# Patient Record
Sex: Female | Born: 1935 | Race: Black or African American | Hispanic: No | Smoking: Former smoker
Health system: Southern US, Community
[De-identification: ages and names within clinical notes are randomized; demographics above are authoritative.]

## PROBLEM LIST (undated history)

## (undated) DIAGNOSIS — I2699 Other pulmonary embolism without acute cor pulmonale: Secondary | ICD-10-CM

## (undated) DIAGNOSIS — F419 Anxiety disorder, unspecified: Secondary | ICD-10-CM

## (undated) DIAGNOSIS — E042 Nontoxic multinodular goiter: Secondary | ICD-10-CM

## (undated) DIAGNOSIS — K922 Gastrointestinal hemorrhage, unspecified: Secondary | ICD-10-CM

## (undated) DIAGNOSIS — M1990 Unspecified osteoarthritis, unspecified site: Secondary | ICD-10-CM

## (undated) DIAGNOSIS — I509 Heart failure, unspecified: Secondary | ICD-10-CM

## (undated) DIAGNOSIS — K5730 Diverticulosis of large intestine without perforation or abscess without bleeding: Secondary | ICD-10-CM

## (undated) DIAGNOSIS — A048 Other specified bacterial intestinal infections: Secondary | ICD-10-CM

## (undated) DIAGNOSIS — I428 Other cardiomyopathies: Secondary | ICD-10-CM

## (undated) DIAGNOSIS — N200 Calculus of kidney: Secondary | ICD-10-CM

## (undated) DIAGNOSIS — Z8679 Personal history of other diseases of the circulatory system: Secondary | ICD-10-CM

## (undated) DIAGNOSIS — I10 Essential (primary) hypertension: Secondary | ICD-10-CM

## (undated) DIAGNOSIS — K219 Gastro-esophageal reflux disease without esophagitis: Secondary | ICD-10-CM

## (undated) DIAGNOSIS — D3500 Benign neoplasm of unspecified adrenal gland: Secondary | ICD-10-CM

## (undated) DIAGNOSIS — I447 Left bundle-branch block, unspecified: Secondary | ICD-10-CM

## (undated) DIAGNOSIS — I480 Paroxysmal atrial fibrillation: Secondary | ICD-10-CM

## (undated) DIAGNOSIS — I2510 Atherosclerotic heart disease of native coronary artery without angina pectoris: Secondary | ICD-10-CM

## (undated) DIAGNOSIS — E7800 Pure hypercholesterolemia, unspecified: Secondary | ICD-10-CM

## (undated) DIAGNOSIS — D126 Benign neoplasm of colon, unspecified: Secondary | ICD-10-CM

## (undated) HISTORY — DX: Calculus of kidney: N20.0

## (undated) HISTORY — DX: Gastro-esophageal reflux disease without esophagitis: K21.9

## (undated) HISTORY — DX: Benign neoplasm of colon, unspecified: D12.6

## (undated) HISTORY — DX: Nontoxic multinodular goiter: E04.2

## (undated) HISTORY — DX: Unspecified osteoarthritis, unspecified site: M19.90

## (undated) HISTORY — PX: KNEE SURGERY: SHX244

## (undated) HISTORY — DX: Pure hypercholesterolemia, unspecified: E78.00

## (undated) HISTORY — DX: Paroxysmal atrial fibrillation: I48.0

## (undated) HISTORY — DX: Diverticulosis of large intestine without perforation or abscess without bleeding: K57.30

## (undated) HISTORY — DX: Benign neoplasm of unspecified adrenal gland: D35.00

## (undated) HISTORY — DX: Anxiety disorder, unspecified: F41.9

## (undated) HISTORY — PX: CATARACT EXTRACTION: SUR2

## (undated) HISTORY — DX: Other pulmonary embolism without acute cor pulmonale: I26.99

## (undated) HISTORY — PX: SHOULDER SURGERY: SHX246

## (undated) HISTORY — DX: Atherosclerotic heart disease of native coronary artery without angina pectoris: I25.10

## (undated) HISTORY — DX: Essential (primary) hypertension: I10

## (undated) HISTORY — PX: TONSILLECTOMY: SUR1361

## (undated) HISTORY — DX: Other cardiomyopathies: I42.8

## (undated) HISTORY — DX: Other specified bacterial intestinal infections: A04.8

---

## 1997-05-04 ENCOUNTER — Encounter: Payer: Self-pay | Admitting: Gastroenterology

## 1997-12-20 ENCOUNTER — Emergency Department (HOSPITAL_COMMUNITY): Admission: EM | Admit: 1997-12-20 | Discharge: 1997-12-20 | Payer: Self-pay | Admitting: Emergency Medicine

## 1998-01-25 ENCOUNTER — Inpatient Hospital Stay (HOSPITAL_COMMUNITY): Admission: RE | Admit: 1998-01-25 | Discharge: 1998-01-27 | Payer: Self-pay | Admitting: Orthopedic Surgery

## 1998-02-01 ENCOUNTER — Encounter: Admission: RE | Admit: 1998-02-01 | Discharge: 1998-05-02 | Payer: Self-pay | Admitting: Orthopedic Surgery

## 1999-08-18 DIAGNOSIS — Z8679 Personal history of other diseases of the circulatory system: Secondary | ICD-10-CM

## 1999-08-18 HISTORY — DX: Personal history of other diseases of the circulatory system: Z86.79

## 1999-10-03 ENCOUNTER — Inpatient Hospital Stay (HOSPITAL_COMMUNITY): Admission: EM | Admit: 1999-10-03 | Discharge: 1999-10-07 | Payer: Self-pay | Admitting: Emergency Medicine

## 1999-10-03 ENCOUNTER — Encounter: Payer: Self-pay | Admitting: Emergency Medicine

## 2001-06-02 ENCOUNTER — Encounter: Payer: Self-pay | Admitting: Urology

## 2001-06-02 ENCOUNTER — Encounter: Admission: RE | Admit: 2001-06-02 | Discharge: 2001-06-02 | Payer: Self-pay | Admitting: Urology

## 2004-06-30 ENCOUNTER — Ambulatory Visit: Payer: Self-pay | Admitting: Pulmonary Disease

## 2004-09-29 ENCOUNTER — Ambulatory Visit: Payer: Self-pay | Admitting: Pulmonary Disease

## 2005-01-27 ENCOUNTER — Ambulatory Visit: Payer: Self-pay | Admitting: Pulmonary Disease

## 2005-05-27 ENCOUNTER — Ambulatory Visit: Payer: Self-pay | Admitting: Pulmonary Disease

## 2005-07-23 ENCOUNTER — Ambulatory Visit: Payer: Self-pay | Admitting: Pulmonary Disease

## 2005-07-28 ENCOUNTER — Ambulatory Visit: Payer: Self-pay | Admitting: Cardiology

## 2005-08-26 ENCOUNTER — Ambulatory Visit: Payer: Self-pay | Admitting: Pulmonary Disease

## 2005-11-25 ENCOUNTER — Ambulatory Visit: Payer: Self-pay | Admitting: Pulmonary Disease

## 2006-03-12 ENCOUNTER — Ambulatory Visit: Payer: Self-pay | Admitting: Pulmonary Disease

## 2006-07-13 ENCOUNTER — Ambulatory Visit: Payer: Self-pay | Admitting: Pulmonary Disease

## 2006-10-11 ENCOUNTER — Ambulatory Visit: Payer: Self-pay | Admitting: Pulmonary Disease

## 2008-05-10 ENCOUNTER — Ambulatory Visit: Payer: Self-pay | Admitting: Pulmonary Disease

## 2008-05-10 DIAGNOSIS — E7800 Pure hypercholesterolemia, unspecified: Secondary | ICD-10-CM | POA: Insufficient documentation

## 2008-05-10 DIAGNOSIS — M1990 Unspecified osteoarthritis, unspecified site: Secondary | ICD-10-CM | POA: Insufficient documentation

## 2008-05-10 DIAGNOSIS — G459 Transient cerebral ischemic attack, unspecified: Secondary | ICD-10-CM | POA: Insufficient documentation

## 2008-05-10 DIAGNOSIS — N200 Calculus of kidney: Secondary | ICD-10-CM

## 2008-05-10 DIAGNOSIS — I10 Essential (primary) hypertension: Secondary | ICD-10-CM | POA: Insufficient documentation

## 2008-05-10 DIAGNOSIS — K219 Gastro-esophageal reflux disease without esophagitis: Secondary | ICD-10-CM

## 2008-06-11 ENCOUNTER — Ambulatory Visit: Payer: Self-pay | Admitting: Pulmonary Disease

## 2008-06-14 LAB — CONVERTED CEMR LAB
ALT: 12 units/L (ref 0–35)
AST: 16 units/L (ref 0–37)
Albumin: 3.8 g/dL (ref 3.5–5.2)
Bilirubin, Direct: 0.1 mg/dL (ref 0.0–0.3)
CO2: 28 meq/L (ref 19–32)
Calcium: 9.5 mg/dL (ref 8.4–10.5)
Cholesterol: 294 mg/dL (ref 0–200)
Direct LDL: 156.6 mg/dL
GFR calc non Af Amer: 57 mL/min
Hgb A1c MFr Bld: 11.8 % — ABNORMAL HIGH (ref 4.6–6.0)
Total Protein: 7.9 g/dL (ref 6.0–8.3)
VLDL: 41 mg/dL — ABNORMAL HIGH (ref 0–40)

## 2008-06-15 ENCOUNTER — Telehealth: Payer: Self-pay | Admitting: Pulmonary Disease

## 2008-06-18 ENCOUNTER — Telehealth (INDEPENDENT_AMBULATORY_CARE_PROVIDER_SITE_OTHER): Payer: Self-pay | Admitting: *Deleted

## 2008-06-20 ENCOUNTER — Ambulatory Visit: Payer: Self-pay | Admitting: Pulmonary Disease

## 2008-06-20 DIAGNOSIS — F411 Generalized anxiety disorder: Secondary | ICD-10-CM | POA: Insufficient documentation

## 2008-06-20 DIAGNOSIS — D3500 Benign neoplasm of unspecified adrenal gland: Secondary | ICD-10-CM | POA: Insufficient documentation

## 2008-06-20 DIAGNOSIS — K5730 Diverticulosis of large intestine without perforation or abscess without bleeding: Secondary | ICD-10-CM | POA: Insufficient documentation

## 2008-06-20 DIAGNOSIS — Q279 Congenital malformation of peripheral vascular system, unspecified: Secondary | ICD-10-CM | POA: Insufficient documentation

## 2008-06-25 ENCOUNTER — Telehealth (INDEPENDENT_AMBULATORY_CARE_PROVIDER_SITE_OTHER): Payer: Self-pay | Admitting: *Deleted

## 2008-07-17 ENCOUNTER — Telehealth: Payer: Self-pay | Admitting: Pulmonary Disease

## 2008-08-08 ENCOUNTER — Ambulatory Visit: Payer: Self-pay | Admitting: Pulmonary Disease

## 2008-08-20 ENCOUNTER — Telehealth: Payer: Self-pay | Admitting: Pulmonary Disease

## 2008-09-10 ENCOUNTER — Ambulatory Visit: Payer: Self-pay | Admitting: Pulmonary Disease

## 2008-09-10 LAB — CONVERTED CEMR LAB
ALT: 15 units/L (ref 0–35)
Albumin: 3.5 g/dL (ref 3.5–5.2)
Alkaline Phosphatase: 57 units/L (ref 39–117)
BUN: 21 mg/dL (ref 6–23)
Bilirubin, Direct: 0.1 mg/dL (ref 0.0–0.3)
CO2: 31 meq/L (ref 19–32)
Calcium: 9.7 mg/dL (ref 8.4–10.5)
Creatinine, Ser: 1.1 mg/dL (ref 0.4–1.2)
GFR calc Af Amer: 62 mL/min
Glucose, Bld: 139 mg/dL — ABNORMAL HIGH (ref 70–99)
Lymphocytes Relative: 32.5 % (ref 12.0–46.0)
MCHC: 33.8 g/dL (ref 30.0–36.0)
MCV: 83.1 fL (ref 78.0–100.0)
Monocytes Absolute: 0.6 10*3/uL (ref 0.1–1.0)
Platelets: 217 10*3/uL (ref 150–400)
RDW: 13 % (ref 11.5–14.6)
TSH: 1.75 microintl units/mL (ref 0.35–5.50)
Total Protein: 8.5 g/dL — ABNORMAL HIGH (ref 6.0–8.3)

## 2008-09-13 ENCOUNTER — Ambulatory Visit: Payer: Self-pay | Admitting: Endocrinology

## 2008-09-26 ENCOUNTER — Encounter: Payer: Self-pay | Admitting: Cardiology

## 2008-09-26 ENCOUNTER — Ambulatory Visit: Payer: Self-pay

## 2008-09-26 ENCOUNTER — Encounter: Payer: Self-pay | Admitting: Endocrinology

## 2008-09-26 ENCOUNTER — Encounter: Payer: Self-pay | Admitting: Pulmonary Disease

## 2008-09-30 ENCOUNTER — Encounter: Payer: Self-pay | Admitting: Endocrinology

## 2008-10-02 ENCOUNTER — Ambulatory Visit: Payer: Self-pay | Admitting: Cardiology

## 2008-10-04 ENCOUNTER — Encounter: Payer: Self-pay | Admitting: Pulmonary Disease

## 2008-10-15 ENCOUNTER — Ambulatory Visit: Payer: Self-pay | Admitting: Cardiology

## 2008-10-15 ENCOUNTER — Ambulatory Visit: Payer: Self-pay

## 2008-10-15 ENCOUNTER — Ambulatory Visit: Payer: Self-pay | Admitting: Endocrinology

## 2008-10-15 ENCOUNTER — Encounter: Payer: Self-pay | Admitting: Cardiology

## 2008-10-15 LAB — CONVERTED CEMR LAB
BUN: 17 mg/dL (ref 6–23)
Creatinine, Ser: 1.1 mg/dL (ref 0.4–1.2)
Glucose, Bld: 167 mg/dL — ABNORMAL HIGH (ref 70–99)

## 2008-11-08 ENCOUNTER — Ambulatory Visit: Payer: Self-pay | Admitting: Endocrinology

## 2008-11-12 ENCOUNTER — Encounter: Payer: Self-pay | Admitting: Cardiology

## 2008-11-12 ENCOUNTER — Ambulatory Visit: Payer: Self-pay | Admitting: Cardiology

## 2008-11-12 LAB — CONVERTED CEMR LAB
CO2: 31 meq/L (ref 19–32)
Chloride: 107 meq/L (ref 96–112)
Creatinine, Ser: 1.1 mg/dL (ref 0.4–1.2)
Eosinophils Absolute: 0.1 10*3/uL (ref 0.0–0.7)
GFR calc non Af Amer: 61.63 mL/min (ref >60)
HCT: 38.8 % (ref 36.0–46.0)
INR: 1 (ref 0.8–1.0)
Lymphocytes Relative: 40.2 % (ref 12.0–46.0)
Monocytes Absolute: 0.5 10*3/uL (ref 0.1–1.0)
Prothrombin Time: 10.3 s — ABNORMAL LOW (ref 10.9–13.3)
RBC: 4.55 M/uL (ref 3.87–5.11)
Sodium: 143 meq/L (ref 135–145)
WBC: 6.5 10*3/uL (ref 4.5–10.5)
aPTT: 33 s — ABNORMAL HIGH (ref 21.7–28.8)

## 2008-11-15 ENCOUNTER — Ambulatory Visit: Payer: Self-pay | Admitting: Cardiology

## 2008-11-15 ENCOUNTER — Inpatient Hospital Stay (HOSPITAL_BASED_OUTPATIENT_CLINIC_OR_DEPARTMENT_OTHER): Admission: RE | Admit: 2008-11-15 | Discharge: 2008-11-15 | Payer: Self-pay | Admitting: Cardiology

## 2008-11-28 ENCOUNTER — Ambulatory Visit: Payer: Self-pay | Admitting: Pulmonary Disease

## 2008-11-29 DIAGNOSIS — I2510 Atherosclerotic heart disease of native coronary artery without angina pectoris: Secondary | ICD-10-CM

## 2008-11-29 LAB — CONVERTED CEMR LAB
Basophils Absolute: 0 10*3/uL (ref 0.0–0.1)
Basophils Relative: 0.6 % (ref 0.0–3.0)
Bilirubin, Direct: 0.1 mg/dL (ref 0.0–0.3)
CO2: 29 meq/L (ref 19–32)
Chloride: 110 meq/L (ref 96–112)
GFR calc non Af Amer: 61.62 mL/min (ref >60)
Glucose, Bld: 102 mg/dL — ABNORMAL HIGH (ref 70–99)
HCT: 38.4 % (ref 36.0–46.0)
HDL: 94.1 mg/dL (ref >39.00)
Hemoglobin: 12.8 g/dL (ref 12.0–15.0)
Lymphocytes Relative: 34.7 % (ref 12.0–46.0)
Lymphs Abs: 1.9 10*3/uL (ref 0.7–4.0)
MCHC: 33.2 g/dL (ref 30.0–36.0)
MCV: 85.5 fL (ref 78.0–100.0)
Monocytes Relative: 6.8 % (ref 3.0–12.0)
Platelets: 152 10*3/uL (ref 150.0–400.0)
RBC: 4.5 M/uL (ref 3.87–5.11)
Total Bilirubin: 0.7 mg/dL (ref 0.3–1.2)
Total CHOL/HDL Ratio: 2
VLDL: 9.6 mg/dL (ref 0.0–40.0)

## 2008-12-05 ENCOUNTER — Ambulatory Visit: Payer: Self-pay | Admitting: Cardiology

## 2008-12-05 ENCOUNTER — Encounter: Payer: Self-pay | Admitting: Physician Assistant

## 2009-01-08 ENCOUNTER — Ambulatory Visit: Payer: Self-pay | Admitting: Endocrinology

## 2009-01-27 ENCOUNTER — Ambulatory Visit: Payer: Self-pay | Admitting: Pulmonary Disease

## 2009-01-28 ENCOUNTER — Encounter: Payer: Self-pay | Admitting: Gastroenterology

## 2009-01-28 ENCOUNTER — Ambulatory Visit: Payer: Self-pay | Admitting: Gastroenterology

## 2009-01-28 ENCOUNTER — Inpatient Hospital Stay (HOSPITAL_COMMUNITY): Admission: EM | Admit: 2009-01-28 | Discharge: 2009-01-31 | Payer: Self-pay | Admitting: Emergency Medicine

## 2009-01-28 ENCOUNTER — Encounter: Payer: Self-pay | Admitting: Pulmonary Disease

## 2009-01-31 ENCOUNTER — Encounter: Payer: Self-pay | Admitting: Pulmonary Disease

## 2009-02-01 ENCOUNTER — Telehealth: Payer: Self-pay | Admitting: Pulmonary Disease

## 2009-02-04 ENCOUNTER — Telehealth: Payer: Self-pay | Admitting: Pulmonary Disease

## 2009-02-14 DIAGNOSIS — I2699 Other pulmonary embolism without acute cor pulmonale: Secondary | ICD-10-CM

## 2009-02-14 HISTORY — DX: Other pulmonary embolism without acute cor pulmonale: I26.99

## 2009-02-26 ENCOUNTER — Ambulatory Visit: Payer: Self-pay | Admitting: Pulmonary Disease

## 2009-02-26 ENCOUNTER — Inpatient Hospital Stay (HOSPITAL_COMMUNITY): Admission: EM | Admit: 2009-02-26 | Discharge: 2009-03-06 | Payer: Self-pay | Admitting: Emergency Medicine

## 2009-02-26 ENCOUNTER — Ambulatory Visit: Payer: Self-pay | Admitting: Cardiology

## 2009-02-27 ENCOUNTER — Encounter: Payer: Self-pay | Admitting: Pulmonary Disease

## 2009-03-06 ENCOUNTER — Encounter: Payer: Self-pay | Admitting: Pulmonary Disease

## 2009-03-08 ENCOUNTER — Telehealth: Payer: Self-pay | Admitting: Pulmonary Disease

## 2009-03-11 ENCOUNTER — Telehealth (INDEPENDENT_AMBULATORY_CARE_PROVIDER_SITE_OTHER): Payer: Self-pay | Admitting: *Deleted

## 2009-03-11 ENCOUNTER — Ambulatory Visit: Payer: Self-pay | Admitting: Pulmonary Disease

## 2009-03-11 DIAGNOSIS — K279 Peptic ulcer, site unspecified, unspecified as acute or chronic, without hemorrhage or perforation: Secondary | ICD-10-CM | POA: Insufficient documentation

## 2009-03-12 LAB — CONVERTED CEMR LAB
Basophils Absolute: 0 10*3/uL (ref 0.0–0.1)
Basophils Relative: 0.5 % (ref 0.0–3.0)
Calcium: 9.4 mg/dL (ref 8.4–10.5)
HCT: 37.1 % (ref 36.0–46.0)
Lymphocytes Relative: 35.5 % (ref 12.0–46.0)
MCHC: 33.4 g/dL (ref 30.0–36.0)
MCV: 83.6 fL (ref 78.0–100.0)
Monocytes Absolute: 0.4 10*3/uL (ref 0.1–1.0)
Neutro Abs: 2.9 10*3/uL (ref 1.4–7.7)
Neutrophils Relative %: 55 % (ref 43.0–77.0)
Platelets: 170 10*3/uL (ref 150.0–400.0)
RDW: 14.3 % (ref 11.5–14.6)
WBC: 5.3 10*3/uL (ref 4.5–10.5)

## 2009-03-14 ENCOUNTER — Telehealth (INDEPENDENT_AMBULATORY_CARE_PROVIDER_SITE_OTHER): Payer: Self-pay | Admitting: *Deleted

## 2009-03-20 ENCOUNTER — Ambulatory Visit: Payer: Self-pay | Admitting: Pulmonary Disease

## 2009-03-22 LAB — CONVERTED CEMR LAB: Prothrombin Time: 13.6 s — ABNORMAL HIGH (ref 9.1–11.7)

## 2009-04-01 ENCOUNTER — Telehealth (INDEPENDENT_AMBULATORY_CARE_PROVIDER_SITE_OTHER): Payer: Self-pay | Admitting: *Deleted

## 2009-04-08 ENCOUNTER — Ambulatory Visit: Payer: Self-pay | Admitting: Pulmonary Disease

## 2009-04-12 ENCOUNTER — Ambulatory Visit: Payer: Self-pay | Admitting: Gastroenterology

## 2009-04-12 DIAGNOSIS — K269 Duodenal ulcer, unspecified as acute or chronic, without hemorrhage or perforation: Secondary | ICD-10-CM | POA: Insufficient documentation

## 2009-04-15 ENCOUNTER — Telehealth: Payer: Self-pay | Admitting: Cardiology

## 2009-04-16 LAB — CONVERTED CEMR LAB
INR: 1.1 — ABNORMAL HIGH (ref 0.8–1.0)
Prothrombin Time: 11.4 s (ref 9.1–11.7)

## 2009-04-19 ENCOUNTER — Ambulatory Visit: Payer: Self-pay | Admitting: Gastroenterology

## 2009-04-29 ENCOUNTER — Ambulatory Visit: Payer: Self-pay | Admitting: Pulmonary Disease

## 2009-05-08 ENCOUNTER — Ambulatory Visit: Payer: Self-pay | Admitting: Endocrinology

## 2009-05-10 ENCOUNTER — Encounter: Admission: RE | Admit: 2009-05-10 | Discharge: 2009-05-10 | Payer: Self-pay | Admitting: Endocrinology

## 2009-05-10 LAB — CONVERTED CEMR LAB
Basophils Relative: 0.5 % (ref 0.0–3.0)
CO2: 31 meq/L (ref 19–32)
Creatinine, Ser: 1.3 mg/dL — ABNORMAL HIGH (ref 0.4–1.2)
GFR calc non Af Amer: 50.76 mL/min (ref >60)
Hemoglobin: 12.8 g/dL (ref 12.0–15.0)
Lymphocytes Relative: 29.3 % (ref 12.0–46.0)
MCHC: 32.7 g/dL (ref 30.0–36.0)
MCV: 85.9 fL (ref 78.0–100.0)
Platelets: 150 10*3/uL (ref 150.0–400.0)
Potassium: 3.6 meq/L (ref 3.5–5.1)
RDW: 13.9 % (ref 11.5–14.6)
TSH: 0.74 microintl units/mL (ref 0.35–5.50)
WBC: 6.3 10*3/uL (ref 4.5–10.5)

## 2009-05-11 DIAGNOSIS — E042 Nontoxic multinodular goiter: Secondary | ICD-10-CM | POA: Insufficient documentation

## 2009-05-16 ENCOUNTER — Telehealth: Payer: Self-pay | Admitting: Pulmonary Disease

## 2009-05-16 ENCOUNTER — Ambulatory Visit: Payer: Self-pay | Admitting: Cardiology

## 2009-05-17 ENCOUNTER — Encounter: Payer: Self-pay | Admitting: Cardiology

## 2009-05-17 ENCOUNTER — Ambulatory Visit: Payer: Self-pay | Admitting: Vascular Surgery

## 2009-05-17 ENCOUNTER — Inpatient Hospital Stay (HOSPITAL_COMMUNITY): Admission: EM | Admit: 2009-05-17 | Discharge: 2009-05-18 | Payer: Self-pay | Admitting: Emergency Medicine

## 2009-05-20 ENCOUNTER — Ambulatory Visit: Payer: Self-pay | Admitting: Pulmonary Disease

## 2009-05-24 ENCOUNTER — Telehealth: Payer: Self-pay | Admitting: Pulmonary Disease

## 2009-05-28 ENCOUNTER — Encounter: Payer: Self-pay | Admitting: Pulmonary Disease

## 2009-06-03 ENCOUNTER — Encounter (INDEPENDENT_AMBULATORY_CARE_PROVIDER_SITE_OTHER): Payer: Self-pay | Admitting: *Deleted

## 2009-06-05 ENCOUNTER — Encounter: Payer: Self-pay | Admitting: Physician Assistant

## 2009-06-05 ENCOUNTER — Ambulatory Visit: Payer: Self-pay | Admitting: Internal Medicine

## 2009-06-16 LAB — CONVERTED CEMR LAB
INR: 2.5 — ABNORMAL HIGH (ref 0.8–1.0)
Prothrombin Time: 25.9 s — ABNORMAL HIGH (ref 9.1–11.7)

## 2009-07-01 ENCOUNTER — Ambulatory Visit: Payer: Self-pay | Admitting: Pulmonary Disease

## 2009-07-03 LAB — CONVERTED CEMR LAB
Calcium: 9.6 mg/dL (ref 8.4–10.5)
Hgb A1c MFr Bld: 8.4 % — ABNORMAL HIGH (ref 4.6–6.5)
INR: 4.8 — ABNORMAL HIGH (ref 0.8–1.0)
Prothrombin Time: 49.4 s — ABNORMAL HIGH (ref 9.1–11.7)

## 2009-07-04 ENCOUNTER — Ambulatory Visit: Payer: Self-pay | Admitting: Cardiology

## 2009-08-05 ENCOUNTER — Ambulatory Visit: Payer: Self-pay | Admitting: Endocrinology

## 2009-08-05 DIAGNOSIS — N184 Chronic kidney disease, stage 4 (severe): Secondary | ICD-10-CM

## 2009-08-22 ENCOUNTER — Ambulatory Visit: Payer: Self-pay | Admitting: Cardiology

## 2009-09-11 ENCOUNTER — Telehealth (INDEPENDENT_AMBULATORY_CARE_PROVIDER_SITE_OTHER): Payer: Self-pay | Admitting: *Deleted

## 2009-09-17 ENCOUNTER — Ambulatory Visit: Payer: Self-pay | Admitting: Internal Medicine

## 2009-09-17 LAB — CONVERTED CEMR LAB: POC INR: 1.5

## 2009-09-18 ENCOUNTER — Telehealth (INDEPENDENT_AMBULATORY_CARE_PROVIDER_SITE_OTHER): Payer: Self-pay | Admitting: *Deleted

## 2009-09-23 ENCOUNTER — Encounter: Payer: Self-pay | Admitting: Cardiovascular Disease

## 2009-09-23 ENCOUNTER — Ambulatory Visit: Payer: Self-pay | Admitting: Cardiovascular Disease

## 2009-09-23 ENCOUNTER — Telehealth: Payer: Self-pay | Admitting: Pulmonary Disease

## 2009-09-30 ENCOUNTER — Ambulatory Visit: Payer: Self-pay | Admitting: Pulmonary Disease

## 2009-09-30 DIAGNOSIS — I4891 Unspecified atrial fibrillation: Secondary | ICD-10-CM | POA: Insufficient documentation

## 2009-09-30 LAB — CONVERTED CEMR LAB: INR: 7.2 (ref 0.8–1.0)

## 2009-10-04 ENCOUNTER — Ambulatory Visit: Payer: Self-pay | Admitting: Pulmonary Disease

## 2009-10-07 ENCOUNTER — Telehealth: Payer: Self-pay | Admitting: Pulmonary Disease

## 2009-10-08 ENCOUNTER — Ambulatory Visit: Payer: Self-pay | Admitting: Cardiology

## 2009-10-17 ENCOUNTER — Ambulatory Visit: Payer: Self-pay | Admitting: Pulmonary Disease

## 2009-10-18 ENCOUNTER — Telehealth: Payer: Self-pay | Admitting: Internal Medicine

## 2009-10-21 ENCOUNTER — Telehealth: Payer: Self-pay | Admitting: Pulmonary Disease

## 2009-10-21 LAB — CONVERTED CEMR LAB
Albumin: 3.5 g/dL (ref 3.5–5.2)
Calcium: 9 mg/dL (ref 8.4–10.5)
Cholesterol: 278 mg/dL — ABNORMAL HIGH (ref 0–200)
Creatinine, Ser: 1.1 mg/dL (ref 0.4–1.2)
Hemoglobin: 12.4 g/dL (ref 12.0–15.0)
Lymphocytes Relative: 40.8 % (ref 12.0–46.0)
Lymphs Abs: 1.8 10*3/uL (ref 0.7–4.0)
Monocytes Relative: 8.2 % (ref 3.0–12.0)
Potassium: 4 meq/L (ref 3.5–5.1)
RBC: 4.47 M/uL (ref 3.87–5.11)
RDW: 13.3 % (ref 11.5–14.6)
Total CHOL/HDL Ratio: 4
Total Protein: 7.2 g/dL (ref 6.0–8.3)
Triglycerides: 303 mg/dL — ABNORMAL HIGH (ref 0.0–149.0)
VLDL: 60.6 mg/dL — ABNORMAL HIGH (ref 0.0–40.0)
WBC: 4.5 10*3/uL (ref 4.5–10.5)

## 2009-11-04 ENCOUNTER — Ambulatory Visit: Payer: Self-pay | Admitting: Pulmonary Disease

## 2009-11-04 ENCOUNTER — Ambulatory Visit: Payer: Self-pay | Admitting: Endocrinology

## 2009-11-05 LAB — CONVERTED CEMR LAB: Prothrombin Time: 33.8 s — ABNORMAL HIGH (ref 9.1–11.7)

## 2009-11-08 ENCOUNTER — Ambulatory Visit: Payer: Self-pay | Admitting: Pulmonary Disease

## 2009-11-08 ENCOUNTER — Ambulatory Visit: Payer: Self-pay | Admitting: Internal Medicine

## 2009-11-11 ENCOUNTER — Encounter: Admission: RE | Admit: 2009-11-11 | Discharge: 2009-11-11 | Payer: Self-pay | Admitting: Endocrinology

## 2009-11-12 ENCOUNTER — Telehealth: Payer: Self-pay | Admitting: Pulmonary Disease

## 2009-11-13 LAB — CONVERTED CEMR LAB
INR: 1.3 — ABNORMAL HIGH (ref 0.8–1.0)
Prothrombin Time: 13.9 s — ABNORMAL HIGH (ref 9.1–11.7)

## 2009-11-21 ENCOUNTER — Ambulatory Visit: Payer: Self-pay | Admitting: Cardiology

## 2009-11-26 ENCOUNTER — Ambulatory Visit: Payer: Self-pay | Admitting: Pulmonary Disease

## 2009-11-26 LAB — CONVERTED CEMR LAB: Prothrombin Time: 11.9 s — ABNORMAL HIGH (ref 9.1–11.7)

## 2009-11-27 ENCOUNTER — Encounter: Admission: RE | Admit: 2009-11-27 | Discharge: 2009-11-27 | Payer: Self-pay | Admitting: Endocrinology

## 2009-11-27 ENCOUNTER — Other Ambulatory Visit: Admission: RE | Admit: 2009-11-27 | Discharge: 2009-11-27 | Payer: Self-pay | Admitting: Interventional Radiology

## 2009-11-27 ENCOUNTER — Encounter: Payer: Self-pay | Admitting: Endocrinology

## 2010-01-17 ENCOUNTER — Ambulatory Visit: Payer: Self-pay | Admitting: Pulmonary Disease

## 2010-01-22 LAB — CONVERTED CEMR LAB
ALT: 18 units/L (ref 0–35)
AST: 22 units/L (ref 0–37)
Alkaline Phosphatase: 64 units/L (ref 39–117)
BUN: 25 mg/dL — ABNORMAL HIGH (ref 6–23)
Calcium: 10 mg/dL (ref 8.4–10.5)
Chloride: 102 meq/L (ref 96–112)
Creatinine,U: 71.3 mg/dL
GFR calc non Af Amer: 56.11 mL/min (ref >60)
Glucose, Bld: 238 mg/dL — ABNORMAL HIGH (ref 70–99)
Lymphocytes Relative: 34.7 % (ref 12.0–46.0)
Lymphs Abs: 2.3 10*3/uL (ref 0.7–4.0)
MCHC: 34.7 g/dL (ref 30.0–36.0)
MCV: 83.7 fL (ref 78.0–100.0)
Neutrophils Relative %: 55.7 % (ref 43.0–77.0)
Potassium: 4.7 meq/L (ref 3.5–5.1)
Prothrombin Time: 15.3 s — ABNORMAL HIGH (ref 9.1–11.7)
RDW: 14.5 % (ref 11.5–14.6)
Sodium: 140 meq/L (ref 135–145)
Total Bilirubin: 0.9 mg/dL (ref 0.3–1.2)

## 2010-02-03 ENCOUNTER — Ambulatory Visit: Payer: Self-pay | Admitting: Endocrinology

## 2010-02-24 ENCOUNTER — Ambulatory Visit: Payer: Self-pay | Admitting: Pulmonary Disease

## 2010-02-24 LAB — CONVERTED CEMR LAB
INR: 2.393 (ref 0.8–1.0)
Prothrombin Time: 25.6 s (ref 9.7–11.8)

## 2010-03-18 ENCOUNTER — Telehealth (INDEPENDENT_AMBULATORY_CARE_PROVIDER_SITE_OTHER): Payer: Self-pay | Admitting: *Deleted

## 2010-03-24 ENCOUNTER — Ambulatory Visit: Payer: Self-pay | Admitting: Pulmonary Disease

## 2010-03-26 ENCOUNTER — Telehealth: Payer: Self-pay | Admitting: Pulmonary Disease

## 2010-03-26 LAB — CONVERTED CEMR LAB: Prothrombin Time: 40.9 s — ABNORMAL HIGH (ref 9.7–11.8)

## 2010-04-04 ENCOUNTER — Telehealth (INDEPENDENT_AMBULATORY_CARE_PROVIDER_SITE_OTHER): Payer: Self-pay | Admitting: *Deleted

## 2010-04-14 ENCOUNTER — Ambulatory Visit: Payer: Self-pay | Admitting: Pulmonary Disease

## 2010-04-17 ENCOUNTER — Telehealth: Payer: Self-pay | Admitting: Pulmonary Disease

## 2010-04-17 LAB — CONVERTED CEMR LAB
INR: 1.2 — ABNORMAL HIGH (ref 0.8–1.0)
Prothrombin Time: 13.2 s — ABNORMAL HIGH (ref 9.7–11.8)

## 2010-05-05 ENCOUNTER — Ambulatory Visit: Payer: Self-pay | Admitting: Pulmonary Disease

## 2010-05-12 ENCOUNTER — Ambulatory Visit: Payer: Self-pay | Admitting: Endocrinology

## 2010-05-19 ENCOUNTER — Ambulatory Visit: Payer: Self-pay | Admitting: Pulmonary Disease

## 2010-05-20 LAB — CONVERTED CEMR LAB: INR: 2 — ABNORMAL HIGH (ref 0.8–1.0)

## 2010-06-11 ENCOUNTER — Ambulatory Visit: Payer: Self-pay | Admitting: Pulmonary Disease

## 2010-06-17 ENCOUNTER — Telehealth (INDEPENDENT_AMBULATORY_CARE_PROVIDER_SITE_OTHER): Payer: Self-pay | Admitting: *Deleted

## 2010-06-17 ENCOUNTER — Ambulatory Visit: Payer: Self-pay | Admitting: Cardiology

## 2010-06-17 LAB — CONVERTED CEMR LAB: Prothrombin Time: 15.9 s — ABNORMAL HIGH (ref 9.7–11.8)

## 2010-06-30 ENCOUNTER — Telehealth: Payer: Self-pay | Admitting: Pulmonary Disease

## 2010-07-07 ENCOUNTER — Ambulatory Visit: Payer: Self-pay | Admitting: Pulmonary Disease

## 2010-07-09 LAB — CONVERTED CEMR LAB: aPTT: 30.9 s — ABNORMAL HIGH (ref 21.7–28.8)

## 2010-07-11 ENCOUNTER — Ambulatory Visit: Payer: Self-pay | Admitting: Pulmonary Disease

## 2010-07-14 LAB — CONVERTED CEMR LAB
INR: 1.3 — ABNORMAL HIGH (ref 0.8–1.0)
Prothrombin Time: 13.9 s — ABNORMAL HIGH (ref 9.7–11.8)

## 2010-08-01 ENCOUNTER — Ambulatory Visit: Payer: Self-pay | Admitting: Endocrinology

## 2010-08-01 LAB — CONVERTED CEMR LAB: INR: 7.3 (ref 0.8–1.0)

## 2010-08-04 ENCOUNTER — Ambulatory Visit: Payer: Self-pay | Admitting: Pulmonary Disease

## 2010-08-17 DIAGNOSIS — D126 Benign neoplasm of colon, unspecified: Secondary | ICD-10-CM

## 2010-08-17 DIAGNOSIS — K922 Gastrointestinal hemorrhage, unspecified: Secondary | ICD-10-CM

## 2010-08-17 HISTORY — DX: Gastrointestinal hemorrhage, unspecified: K92.2

## 2010-08-17 HISTORY — DX: Benign neoplasm of colon, unspecified: D12.6

## 2010-08-19 ENCOUNTER — Telehealth: Payer: Self-pay | Admitting: Pulmonary Disease

## 2010-08-22 ENCOUNTER — Ambulatory Visit
Admission: RE | Admit: 2010-08-22 | Discharge: 2010-08-22 | Payer: Self-pay | Source: Home / Self Care | Attending: Pulmonary Disease | Admitting: Pulmonary Disease

## 2010-08-22 ENCOUNTER — Other Ambulatory Visit: Payer: Self-pay | Admitting: Pulmonary Disease

## 2010-08-22 LAB — PROTIME-INR
INR: 4.7 ratio — ABNORMAL HIGH (ref 0.8–1.0)
Prothrombin Time: 45.2 s — ABNORMAL HIGH (ref 10.2–12.4)

## 2010-08-27 ENCOUNTER — Telehealth (INDEPENDENT_AMBULATORY_CARE_PROVIDER_SITE_OTHER): Payer: Self-pay | Admitting: *Deleted

## 2010-09-02 ENCOUNTER — Ambulatory Visit
Admission: RE | Admit: 2010-09-02 | Discharge: 2010-09-02 | Payer: Self-pay | Source: Home / Self Care | Attending: Pulmonary Disease | Admitting: Pulmonary Disease

## 2010-09-02 ENCOUNTER — Other Ambulatory Visit: Payer: Self-pay | Admitting: Pulmonary Disease

## 2010-09-02 LAB — CBC WITH DIFFERENTIAL/PLATELET
Basophils Absolute: 0 10*3/uL (ref 0.0–0.1)
Basophils Relative: 0.5 % (ref 0.0–3.0)
Eosinophils Absolute: 0.1 10*3/uL (ref 0.0–0.7)
Eosinophils Relative: 1.4 % (ref 0.0–5.0)
HCT: 39 % (ref 36.0–46.0)
Hemoglobin: 13.1 g/dL (ref 12.0–15.0)
Lymphocytes Relative: 36.4 % (ref 12.0–46.0)
Lymphs Abs: 1.9 10*3/uL (ref 0.7–4.0)
MCHC: 33.5 g/dL (ref 30.0–36.0)
MCV: 86.1 fl (ref 78.0–100.0)
Monocytes Absolute: 0.3 10*3/uL (ref 0.1–1.0)
Monocytes Relative: 6.6 % (ref 3.0–12.0)
Neutro Abs: 2.8 10*3/uL (ref 1.4–7.7)
Neutrophils Relative %: 55.1 % (ref 43.0–77.0)
Platelets: 138 10*3/uL — ABNORMAL LOW (ref 150.0–400.0)
RBC: 4.53 Mil/uL (ref 3.87–5.11)
RDW: 13.7 % (ref 11.5–14.6)
WBC: 5.1 10*3/uL (ref 4.5–10.5)

## 2010-09-02 LAB — BASIC METABOLIC PANEL
BUN: 14 mg/dL (ref 6–23)
CO2: 25 mEq/L (ref 19–32)
Calcium: 9.3 mg/dL (ref 8.4–10.5)
Chloride: 108 mEq/L (ref 96–112)
Creatinine, Ser: 1.2 mg/dL (ref 0.4–1.2)
GFR: 56.57 mL/min — ABNORMAL LOW (ref >60.00)
Glucose, Bld: 122 mg/dL — ABNORMAL HIGH (ref 70–99)
Potassium: 4.4 mEq/L (ref 3.5–5.1)
Sodium: 142 mEq/L (ref 135–145)

## 2010-09-02 LAB — HEPATIC FUNCTION PANEL
ALT: 14 U/L (ref 0–35)
AST: 21 U/L (ref 0–37)
Albumin: 3.8 g/dL (ref 3.5–5.2)
Alkaline Phosphatase: 45 U/L (ref 39–117)
Bilirubin, Direct: 0 mg/dL (ref 0.0–0.3)
Total Bilirubin: 0.5 mg/dL (ref 0.3–1.2)
Total Protein: 7.2 g/dL (ref 6.0–8.3)

## 2010-09-02 LAB — LIPID PANEL
Cholesterol: 198 mg/dL (ref 0–200)
HDL: 71.3 mg/dL (ref >39.00)
LDL Cholesterol: 92 mg/dL (ref 0–99)
Total CHOL/HDL Ratio: 3
Triglycerides: 175 mg/dL — ABNORMAL HIGH (ref 0.0–149.0)
VLDL: 35 mg/dL (ref 0.0–40.0)

## 2010-09-02 LAB — HEMOGLOBIN A1C: Hgb A1c MFr Bld: 8.5 % — ABNORMAL HIGH (ref 4.6–6.5)

## 2010-09-02 LAB — TSH: TSH: 1.62 u[IU]/mL (ref 0.35–5.50)

## 2010-09-11 ENCOUNTER — Telehealth: Payer: Self-pay | Admitting: Pulmonary Disease

## 2010-09-15 ENCOUNTER — Ambulatory Visit: Admit: 2010-09-15 | Payer: Self-pay | Admitting: Pulmonary Disease

## 2010-09-18 ENCOUNTER — Encounter (INDEPENDENT_AMBULATORY_CARE_PROVIDER_SITE_OTHER): Payer: Self-pay | Admitting: *Deleted

## 2010-09-18 ENCOUNTER — Other Ambulatory Visit: Payer: MEDICARE

## 2010-09-18 ENCOUNTER — Encounter: Payer: Self-pay | Admitting: Cardiology

## 2010-09-18 ENCOUNTER — Other Ambulatory Visit: Payer: Self-pay | Admitting: Pulmonary Disease

## 2010-09-18 ENCOUNTER — Ambulatory Visit (INDEPENDENT_AMBULATORY_CARE_PROVIDER_SITE_OTHER): Payer: MEDICARE | Admitting: Cardiology

## 2010-09-18 ENCOUNTER — Ambulatory Visit: Admit: 2010-09-18 | Payer: Self-pay | Admitting: Cardiology

## 2010-09-18 DIAGNOSIS — I5022 Chronic systolic (congestive) heart failure: Secondary | ICD-10-CM

## 2010-09-18 DIAGNOSIS — I10 Essential (primary) hypertension: Secondary | ICD-10-CM

## 2010-09-18 DIAGNOSIS — Z7901 Long term (current) use of anticoagulants: Secondary | ICD-10-CM

## 2010-09-18 NOTE — Assessment & Plan Note (Signed)
Summary: rov 3 months///kp   Primary Care Provider:  Lorin Picket Jasara Corrigan,MD  CC:  3 month ROV & review of mult medical problems- here to re-establish Coumadin at [facility] office....  History of Present Illness: 77 y/o BF here for a follow up visit... he has multiple medical problems as noted below...     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 treated w/ PPI & PYLERA (broken down to the generic Bismuth, Metronidazole, Tetracycline- she is Penicillin allergic)...  ~  [facility] 7/13-21/10 w/ pleuritic right CP- eval revealed +PTE and f/u 2DEcho showed infer AK, septal dysynergy, atrial septal aneurym, EF= 30-35%... placed on Heparin & bridged to Coumadin Rx (needs very careful monitoring due to duod ulcer & prev hx SAH in 2001 from an AVM)...   ~  March 11, 2009:  post hospital visit & doing satis since discharge... taking Coumadin as directed and she is finishing her HPylori treatment... feels better- denies CP, SOB, abd pain, n/v, etc... we discussed the need for careful monitoring of protimes, Cards f/u in light of recent findings, GI f/u after Rx for HPylori... we will check labs today & f/u w/ coumadin clinic...   ~  April 29, 2009:  she is stable... she had f/u DrStark 8/10- "the bug is gone" w/ eradication of the HPylori... she is taking the Coumadin 5mg  tabs but continues to struggle w/ protocol for protimes at [facility] office... we discussed this in detail again today.   ~  July 01, 2009:  she was hosp by Cards 9/30-10/2/10 for CP- she ruled out for MI & had f/u CT Angio chest- NEG for emboli (prev emboli resolved), & VenDopplers- NEG for DVT... Ziac changed to Coreg due to brady, and Losartan decr to 50 due to BP... she notes occas palpit & states she feels worse on the Coreg... BP is up to 146/90 (we will incr Cozaar back to 100mg /d)... still on the Coumadin 5mg /d- protime= 49.4/ INR= 4.8 & we discussed stopping the coumadin due to recent neg CT Angio & bleeding  risk...  labs today show BS= 201/ A1c= 8.4 on Metformin500Bid + Glimep1mg /d (prev refused to incr to 2mg /d)... DrEllison tried to add Actos but too$$ & Cards objected due to cardiomyopathy...    ~  September 30, 2009:  she had f/u DrHochrein c/o palpit & 21d holter showed PAF- he restarted Coumadin but she refuses CC due to the $$$ & we will follow protimes at [facility] starting today... her blood sugars are "all over the place" - medication compliance remains poor & we discussed all these issues today...   Current Problem List:  Hx of PULMONARY EMBOLISM (ICD-415.19) - presented w/ pleuritic right CP 7/10 w/ CT angio showing pulm emboli & Rx w/ hep> coumadin... protimes followed w/ the [facility] office protocol but she's had problems following directions...  ~  hosp by Cards 9/30-10/2/10 for CP- she ruled out for MI & had f/u CT Angio chest- NEG for emboli (prev emboli resolved), & VenDopplers- NEG for DVT...  ~  Protime 07/01/09 49.4/ INR 4.8 (on Coumadin 5mg /d) & she's at hi risk for bleed w/ hx DU & AVM- therefore coumadin stopped.  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  HYPERTENSION (ICD-401.9) - long hx of signif HBP w/ hosp for [facility]  in 2001 by DrKritzer (due to an AVM)... previously on Toprol XL & Lotrel- developed an ACE cough... long hx non-compliance w/ med Rx...  she denies HA, fatigue, visual changes, CP, palipit, dizziness, syncope, dyspnea, edema, etc... now on COREG 6.25 Bid + COZAAR 100mg /d... she prefers garlic and lemon juice!!!  NOTE: med compliance is often an issue w/ Lyn.  ~  4/10:  BP= 160/90 today, pt supposed to be on Bisoprolol 2.5mg  daily, and add Losartan 100mg /d.  ~  7/10:  BP= 140/80 on Ziac2.5 + Cozaar100... reminded to take meds every day!!!  ~  9/10:  BP= 132/82 on Ziac2.5 + Cozaar 100... continue same!  ~  10/10: Hosp by  Cards and meds changed- COREG 3.125Bid + COZAAR100- 1/2 tab/d...   ~  11/10:  BP today= 146/90 & states she doesn't feel well- rec incr Cozaar back to 100mg /d.  ~  2/11:  BP= 154/90 & supposed to be on Coreg 6.25Bid & Losartan 100mg /d- reminder to take daily!  CORONARY ARTERY DISEASE (ICD-414.00) & CARDIOMYOPATHY (ICD-425.4) - eval 2/10 by DrHochrein for Cards: he has perscribed BBlocker and ACE- but developed ACE cough therefore switched to ARB; but pt has long hx of non-compliance w/ med Rx.  ~  abn EKG w/ LBBB...  ~  2DEcho 3/10 showed diffuse LV HK w/ EF ~25%, mild calcif AoV, mild MR, mod dil LA & atrial septal aneurysm.  ~  NuclearStressTest 2/10 showed cardiomyop w/ diffuse HK & EF ~35%.  ~  Cath 11/15/08 w/ EF= 30-35%, & non-obstructive CAD w/ 30-40% midLAD, 40-70% Diag branch of LAD, calcif in proxCIRC, & 30% midRCA.  ~  Repeat 2DECho 7/10 showed infer AK, septal dysynergy, atrial septal aneurym, EF= 30-35%...  ~  Hosp 10/10 by Cards & r/o for MI, meds adjusted as noted.  PAROXYSMAL ATRIAL FIBRILLATION (ICD-427.31) - eval for palpit by DrHochrein w/ PAF on Holter>> he ordered Coumadin restarted...  HYPERCHOLESTEROLEMIA (ICD-272.0) - previously on Vytorin but she stopped this in 2009... on diet alone now... she really doesn't want statin Rx and we discussed treatment w/ red yeast rice, oatmeal, herbal remedies for now...  ~  FLP 11/07 showed TChol 285, TG 155, HDL 80, LDL 165... obviously not taking med.  ~  FLP 10/09 showed TChol 294, TG 205, HDL 78, LDL 157... she does not want to start statin Rx.  ~  Discussed starting Rx in light of her cardiomyopathy & CAD on cath- she refuses statin Rx.  ~  FLP 4/10 on diet showed TChol 211, TG 48, HDL 94, LDL 98... rec> keep up the great work!  DIABETES MELLITUS (ICD-250.00) - previously on Glucovance & Actos... she stopped all her meds early in 2009... we discussed the need for diabetic meds & our options regarding meds and their cost etc... she  doesn't want "the needle"...   ~  lab 11/07 showed BS= 117, HgA1c= 8.0.Marland KitchenMarland Kitchen  ~  labs 10/09 showed BS= 319, HgA1c= 11.8.Marland KitchenMarland Kitchen re-started Metformin 500mg - 2Bid + Glimepiride 4mg /d, but c/o abd pain on the Metformin and stopped the Glimepiride on her own...  ~  DrEllison opted for Rx w/ ACTOS 45mg /d, & GLIMEPIRIDE 1mg - 1/2 tabAM (intol Metformin).  ~  labs 4/10 showed BS= 102, A1c= 6.9.Marland KitchenMarland Kitchen continue same.  ~  7/10: regulated in hosp w/ GLUCOPHAGE 500- 1/2Bid + GLIMEPIRIDE 1mg /d... labs showed BS= 144, A1c= 7.4.  ~  9/10: labs showed BS= 270, A1c= 7.3.Marland KitchenMarland Kitchen prev intol to Metform500, therefore incr GLIMEPIRIDE 2mg Qam (she refused).  ~  11/10: Bs= 201, A1c= 8.4.Marland KitchenMarland Kitchen rec try to incr Metform500Bid & Glimep2mg /d...  ~  labs 2/11 showed BS=  pending  GERD (ICD-530.81) - previously on herbal remedies w/ prev eval from [facility] & DrNat-Mann in 1998...   ~  [facility] 6/10 w/ hemetemesis & UGI bleed from DU +HPylori... eval by [facility] & Rx w/ OMPEPRAZOLE 40mg  & PYLERA (allergic PCN)...  ~  GI f/u DrStark 9/10- HPylori eradicated, continue Omep40mg /d...  DIVERTICULOSIS OF COLON (ICD-562.10) -   ~  last procedure= FlexSig 1998 by DrStark showing divertics only...  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  NEPHROLITHIASIS (ICD-592.0)  DEGENERATIVE JOINT DISEASE (ICD-715.90) - uses VICODIN Prn...  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 sudden HA- eval revealed AVM & conservative approach recommended by Neurosurgery w/ BP control...  NEUROPATHY (ICD-355.9)  ANXIETY (ICD-300.00)   Allergies: 1)  ! Penicillin 2)  ! Lisinopril (Lisinopril)  Comments:  Nurse/Medical Assistant: The patient's medications and allergies were reviewed with the patient  and were updated in the Medication and Allergy Lists.  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 Diabetes in grandmother, but no dm in immediate family Heart disease in aunt lung and brainCancer in mother  Father died from cariovascular hemmorage, had glaucoma ?cancer-uncle No FH of Colon Cancer: mother had a goiter  Social History: Reviewed history from 04/12/2009 and no changes required. The patient is a widow.  She has one daughter who lives   about 8 miles away.  She is retired.  She quit smoking in 1985.  She   drinks wine occasionally.  quit 5-6 months ago   no caffeine Illicit Drug Use - no  Review of Systems      See HPI  The patient denies anorexia, fever, weight loss, weight gain, vision loss, decreased hearing, hoarseness, chest pain, syncope, dyspnea on exertion, peripheral edema, prolonged cough, headaches, hemoptysis, abdominal pain, melena, hematochezia, severe indigestion/heartburn, hematuria, incontinence, muscle weakness, suspicious skin lesions, transient blindness, difficulty walking, depression, unusual weight change, abnormal  bleeding, enlarged lymph nodes, and angioedema.         she notes occas episodes of chest discomort, palpit... denies dizzy, syncope, edema...  Vital Signs:  Patient profile:   77 year old female Height:      62 inches Weight:      136.38 pounds O2 Sat:      100 % on Room air Temp:     97.3 degrees F oral Pulse rate:   64 / minute BP sitting:   154 / 90  (right arm) Cuff size:   regular  Vitals Entered By: Randell Loop CMA (September 30, 2009 12:42 PM)  O2 Sat at Rest %:  100 O2 Flow:  Room  air CC: 3 month ROV & review of mult medical problems- here to re-establish Coumadin at [facility] office... Comments meds updated today   Physical Exam  Additional Exam:  WD, WN, 77 y/o BF in NAD... GENERAL:  Alert & oriented; pleasant & cooperative... HEENT:  [place]/AT, EOM-wnl, PERRLA, EACs-clear, TMs-wnl, NOSE-clear, THROAT-clear & wnl. NECK:  Supple w/ fairROM; no JVD; normal carotid impulses w/o bruits; no thyromegaly but sm nodule palpated; no lymphadenopathy. CHEST:  Clear to P & A; without wheezes/ rales/ or rhonchi. HEART:  Regular Rhythm; gr 1/6 SEM, no rubs or gallops heard... ABDOMEN:  Soft & nontender; normal bowel sounds; no organomegaly or masses detected. EXT: without deformities, mild arthritic changes; no varicose veins/ venous insuffic/ or edema. NEURO:  CN's intact; motor testing normal; sensory testing normal; gait normal & balance OK. DERM:  No lesions noted; no rash etc...    MISC. Report  Procedure date:  09/30/2009  Findings:      Tests: (1) PT (PTP)   Order Note: Critical result called to Linus Galas on 09/30/2009 1:25 PM by Scales, Hope. Results were read back to caller. (PTL, INRL)   Prothrombin Time     [HH] 73.8 sec                    9.1-11.7   INR                  [HH] 7.2 ratio                   0.8-1.0   Impression & Recommendations:  Problem # 1:  COUMADIN THERAPY (ICD-V58.61) She has a terrible hx of non-compliance w/ med Rx... difficult coumadin management  in the past... now back on Coumadin per DrHochrein for PAF & elev CHADS2 score... also at incr risk for bleeding w/ hx SAH from AVM in 2001, and GIbleed from PUD 6/10... Since starting back on Coumadin 5mg  tabs: PT INR on 5mg /d = 1.5, increased to 7.5mg  on Wed... PT INR on 5mg X6d & 7.5mg X1d = 2.8, and decr back to 5mg /d... PT INR today on 5mg /d = 7.3, and coumadin held x4d w/ repeat Protime.  Problem # 2:  PAROXYSMAL ATRIAL FIBRILLATION (ICD-427.31) Per DrHochrein from her recent Holter monitor... he wants her back on Coumadin. Her updated medication list for this problem includes:    Carvedilol 6.25 Mg Tabs (Carvedilol) .Marland Kitchen... Take one tablet by mouth twice a day    Coumadin 5 Mg Tabs (Warfarin sodium) .Marland Kitchen... 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 Her updated medication list for this problem includes:    Coumadin 5 Mg Tabs (Warfarin sodium) .Marland Kitchen... Take 1 tablet by mouth once a day  Problem # 4:  HYPERTENSION (ICD-401.9) BP is borderline at best-  pt reminded to take med daily without fail... Her updated medication list for this problem includes:    Carvedilol 6.25 Mg Tabs (Carvedilol) .Marland Kitchen... Take one tablet by mouth twice a day    Cozaar 100 Mg Tabs (Losartan potassium) .Marland Kitchen... Take 1 tab by mouth once daily...  Problem # 5:  CARDIOMYOPATHY (ICD-425.4) Followed by DrHochrein... pt reminded to take meds every day!!!  Problem # 6:  HYPERCHOLESTEROLEMIA (ICD-272.0) She controls this w/ combination of raw garlic & garlic powder, she says.  Problem # 7:  DIABETES MELLITUS (ICD-250.00) Pt is requested to take  meds regularly every day & f/u labs this week... Her updated medication list for this problem includes:    Cozaar 100 Mg Tabs (Losartan potassium) .Marland Kitchen... Take 1 tab by mouth once daily...    Glucophage 500 Mg Tabs (Metformin hcl) .Marland Kitchen... Take 1 tab by mouth two times a day w/  breakfast & dinner...    Glimepiride 1 Mg Tabs (Glimepiride) .Marland Kitchen... Take 1 tab by mouth once daily (in am)...  Problem # 8:  GOITER, MULTINODULAR (ICD-241.1) Clinically & biochemically euthyroid...  Problem # 9:  OTHER MEDICAL PROBLEMS AS NOTED>>>  Complete Medication List: 1)  Carvedilol 6.25 Mg Tabs (Carvedilol) .... Take one tablet by mouth twice a day 2)  Cozaar 100 Mg Tabs (Losartan potassium) .... Take 1 tab by mouth once daily.Marland KitchenMarland Kitchen 3)  Glucophage 500 Mg Tabs (Metformin hcl) .... Take 1 tab by mouth two times a day w/ breakfast & dinner... 4)  Glimepiride 1 Mg Tabs (Glimepiride) .... Take 1 tab by mouth once daily (in am).Marland KitchenMarland Kitchen 5)  Omeprazole 20 Mg Tbec (Omeprazole) .... Take 1 tablet by mouth once a day 30 minutes before breakfast. 6)  Vicodin 5-500 Mg Tabs (Hydrocodone-acetaminophen) .... Take one tablet by mouth every 6 hours as needed for pain 7)  Coumadin 5 Mg Tabs (Warfarin sodium) .... Take 1 tablet by mouth once a day  Patient Instructions: 1)  We will call you later today w/ your Coumadin results.Marland KitchenMarland Kitchen 2)  Let's plan follow up FASTING blood work, with a Protime, in 2 weeks here... then don't forget to call us the next day for your results!!! 3)  Continue your other medications the same... 4)  Please schedule a follow-up appointment in 3 months.

## 2010-09-18 NOTE — Progress Notes (Signed)
Summary: protime  Phone Note Call from Patient Call back at Home Phone [phone]   Caller: Patient Call For: nadel Summary of Call: pt wants to know when her next protime is due?  Initial call taken by: Tivis Ringer, CNA,  June 30, 2010 11:45 AM  Follow-up for Phone Call        ATC, NA, no voicemail. WCB. Pt due for PT week of 07-07-10 or week of 07-14-10 per last results append. Carron Curie CMA  June 30, 2010 12:24 PM  [id number].Carron Curie CMA  June 30, 2010 2:17 PM  pt advised per append to have lab rechecked in 3-4 weeks from last check. Carron Curie CMA  June 30, 2010 3:57 PM

## 2010-09-18 NOTE — Assessment & Plan Note (Signed)
Summary: 3 MO ROV /NWS   Vital Signs:  Patient profile:   77 year old female Height:      62 inches (157.48 cm) Weight:      136.25 pounds (61.93 kg) BMI:     25.01 O2 Sat:      98 % on Room air Temp:     97.5 degrees F (36.39 degrees C) oral Pulse rate:   67 / minute BP sitting:   124 / 72  (left arm) Cuff size:   regular  Vitals Entered By: Brenton Grills MA (May 12, 2010 11:33 AM)  O2 Flow:  Room air CC: 3 month F/U/aj Is Patient Diabetic? Yes   Referring Provider:  Alroy Dust, MD Primary Provider:  Lalla Brothers  CC:  3 month F/U/aj.  History of Present Illness: pt states few years of moderate cramps in the legs, in the context of lying in bed, and assoc fatigue.   no cbg record, but states cbg's are "up and down."  she takes metformin (only 1000 mg qam), and glimepiride.  she says she misses meds approx once/week.  Current Medications (verified): 1)  Coumadin 5 Mg Tabs (Warfarin Sodium) .... As Directed Per Coumadin Clinic 2)  Carvedilol 12.5 Mg Tabs (Carvedilol) .... One Twice A Day 3)  Cozaar 100 Mg Tabs (Losartan Potassium) .... Take 1 Tab By Mouth Once Daily.Marland KitchenMarland Kitchen 4)  Pravastatin Sodium 40 Mg Tabs (Pravastatin Sodium) .Marland Kitchen.. 1 At Bedtime - Hold Due To Muscle Pain 5)  Metformin Hcl 500 Mg Xr24h-Tab (Metformin Hcl) .... 4 Tabs Each Am 6)  Glimepiride 4 Mg Tabs (Glimepiride) .Marland Kitchen.. 1 Tab Each Am 7)  Vicodin 5-500 Mg Tabs (Hydrocodone-Acetaminophen) .... Take One Tablet By Mouth Every 6 Hours As Needed For Pain 8)  Soma 350 Mg Tabs (Carisoprodol) .... Take 1 Tab By Mouth Three Times A Day As Needed For Muscle Spasm... 9)  Multivitamins  Tabs (Multiple Vitamin) .... Take 1 Tablet By Mouth Once A Day  Allergies (verified): 1)  ! Penicillin 2)  ! 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         denies hypoglycemia.  Physical Exam  General:  normal appearance.   Pulses:  dorsalis pedis intact bilat.   Extremities:  no deformity.  no ulcer on the feet.  feet are of normal color and temp.  no edema  Neurologic:  sensation is intact to touch on the feet  Psych:  depressed affect.   Additional Exam:  Hemoglobin A1C       [H]  8.3 %           Impression & Recommendations:  Problem # 1:  DIABETES MELLITUS (ICD-250.00) needs increased rx  Problem # 2:  leg cramps uncertain etiology  Other Orders: TLB-A1C / Hgb A1C (Glycohemoglobin) (83036-A1C) Est. Patient Level III (16109)  Patient Instructions: 1)  please make every effort to remember your medications. 2)  Please schedule a follow-up appointment in 3 months. 3)  check  your blood sugar 1 time a day.  vary the time of day when you check, between before the 3 meals, and at bedtime.  also check if you have symptoms of your blood sugar being too high or too low.  please keep a record of the readings and bring it to your next appointment here.  please call us sooner if you are having low blood sugar episodes.   4)  please try the carisprodol pills prescribed by dr Kriste Basque, for your cramps.   5)  blood tests are being ordered for you today.  please call  [phone] to hear your test results. 6)  (update: i left message on phone-tree:  i tried to call you, but your # does not accept "blocked" calls."  you should either increase metformin, or add [place]).

## 2010-09-18 NOTE — Progress Notes (Signed)
Summary: lab results  Phone Note Call from Patient   Caller: Patient--CELL [phone] Call For: nadel Reason for Call: Lab or Test Results Summary of Call: Patient requesting lab results done Friday. Initial call taken by: Lehman Prom,  March 26, 2010 12:19 PM  Follow-up for Phone Call        Marliss Czar, will you please have SN reivew and advise. Labs are unsigned in EMR and are attached with this message.Reynaldo Minium CMA  March 26, 2010 2:52 PM   Additional Follow-up for Phone Call Additional follow up Details #1::        per SN---pt is on coumadin 5mg   1 x 2 days on monday, thursday---1/2 tab x 5 days  tuesday, wednesday, friday, saturday and sunday.  too think so rec is to hold for next 5 days then restart 1/2 tab every day and return in 3 wks for recheck and call the next day for results.  lab in computer for 8-29 and i called and lvmom for pt to return my call. Randell Loop [facility]  March 26, 2010 4:37 PM     Additional Follow-up for Phone Call Additional follow up Details #2::    pt returned my call and she is aware of SN recs.  she did repeat this to me after reviewing a couple of times on new instructions.pt is aware of lab appt on 8-29 and will call the next day for her results. Randell Loop CMA  March 26, 2010 4:41 PM

## 2010-09-18 NOTE — Assessment & Plan Note (Signed)
Summary: 6 month 401.1 428.0  pfh,rn  Medications Added CARVEDILOL 12.5 MG TABS (CARVEDILOL) one and 1/2 tablets twice a day      Allergies Added:    Visit Type:  Follow-up Primary Provider:  Lorin Picket Nadel,MD  CC:  Cardiomyopathy.  History of Present Illness: The patient presents for followup of her known cardiomyopathy. At the last visit I slightly increased carvedilol. She did well with this. She has had no presyncope or syncope. She has had no chest pressure, neck or arm discomfort. She denies any new shortness of breath, PND or orthopnea. She will get short of breath if she walks quickly. However, she can walk 2 and do Curves without difficulty. She was having a stronger heartbeat or palpitations but this seems to have improved with the higher dose of carvedilol. She still has stress in her life regarding her boyfriend.  Current Medications (verified): 1)  Coumadin 5 Mg Tabs (Warfarin Sodium) .... As Directed Per Coumadin Clinic 2)  Carvedilol 12.5 Mg Tabs (Carvedilol) .... One Twice A Day 3)  Cozaar 100 Mg Tabs (Losartan Potassium) .... Take 1 Tab By Mouth Once Daily.Marland KitchenMarland Kitchen 4)  Pravastatin Sodium 40 Mg Tabs (Pravastatin Sodium) .Marland Kitchen.. 1 At Bedtime - Hold Due To Muscle Pain 5)  Metformin Hcl 500 Mg Xr24h-Tab (Metformin Hcl) .... 4 Tabs Each Am 6)  Glimepiride 4 Mg Tabs (Glimepiride) .Marland Kitchen.. 1 Tab Each Am 7)  Vicodin 5-500 Mg Tabs (Hydrocodone-Acetaminophen) .... Take One Tablet By Mouth Every 6 Hours As Needed For Pain 8)  Soma 350 Mg Tabs (Carisoprodol) .... Take 1 Tab By Mouth Three Times A Day As Needed For Muscle Spasm... 9)  Multivitamins  Tabs (Multiple Vitamin) .... Take 1 Tablet By Mouth Once A Day  Allergies (verified): 1)  ! Penicillin 2)  ! Lisinopril (Lisinopril)  Past History:  Past Medical History: Reviewed history from 05/05/2010 and no changes required. 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   Vital Signs:  Patient profile:   77 year old female Height:      62 inches Weight:      137 pounds BMI:     25.15 Pulse rate:   63 / minute Resp:     16 per minute BP sitting:   160 / 88  (right arm)  Vitals Entered By: Marrion Coy, CNA (June 17, 2010 11:49 AM)  Physical Exam  General:  Well developed, well nourished, in no acute distress. Head:  normocephalic and atraumatic Mouth:  Poor dentition, gums and palate normal. Oral mucosa normal. Neck:  Neck supple, no JVD. No masses, thyromegaly or abnormal cervical nodes. Chest Wall:  no deformities Lungs:  Clear bilaterally to auscultation and percussion. Abdomen:  Bowel sounds positive; abdomen soft and non-tender without masses, organomegaly, or hernias noted. No hepatosplenomegaly. Msk:  Back normal, normal gait. Muscle strength and tone normal. Extremities:  No clubbing or cyanosis. Neurologic:  Alert and oriented x 3. Skin:  Intact without lesions or rashes. Cervical Nodes:  no significant adenopathy Inguinal Nodes:  no significant adenopathy Psych:  Normal affect.   Detailed Cardiovascular Exam  Neck    Carotids: Carotids full and equal bilaterally without  bruits.      Neck Veins: Normal, no JVD.    Heart    Inspection: no deformities or lifts noted.      Palpation: normal PMI with no thrills palpable.      Auscultation: regular rate and rhythm, S1, S2 without murmurs, rubs, gallops, or clicks.    Vascular    Abdominal Aorta: no palpable masses, pulsations, or audible bruits.      Femoral Pulses: normal femoral pulses bilaterally.      Pedal Pulses: dorsalis pedis intact bilat.      Radial Pulses: normal radial pulses bilaterally.      Peripheral Circulation: no clubbing, cyanosis, or edema noted with normal capillary refill.     EKG  Procedure date:  06/17/2010  Findings:      Sinus rhythm, rate 62, interventricular conduction delay, right axis deviation  Impression & Recommendations:  Problem # 1:  CARDIOMYOPATHY (ICD-425.4) I will continue to titrate her beta blocker for management of both this and her hypertension. She will go to 18.75 mg of Coreg b.i.d.  Problem # 2:  CORONARY ARTERY DISEASE (ICD-414.00) She will continue with medical management and risk reduction.  Problem # 3:  HYPERTENSION (ICD-401.9) Her blood pressure is still not at target and will be addressed with the beta blocker titration as above. She is always reluctant to take more medicines but is compliant and I will ease her into the appropriate regimen.  Other Orders: EKG w/ Interpretation (93000)  Patient Instructions: 1)  Your physician recommends that you schedule a follow-up appointment in: 3 month with Dr Antoine Poche 2)  Your physician has recommended you make the following change in your medication: Increase Carvedilol to 12.5 mg one and 1/2 tablet twice a day 3)  Needs screening for Abdominal ultrasound and carotid doppler Prescriptions: CARVEDILOL 12.5 MG TABS (CARVEDILOL) one and 1/2 tablets twice a day  #90 x 6   Entered by:   Charolotte Capuchin, RN   Authorized by:   Rollene Rotunda, MD, [facility]   Signed by:   Charolotte Capuchin, RN on  06/17/2010   Method used:   [facility]        [facility]  [address]       [address]       [place], [place]  [address]       Ph: [phone] or [phone]       Fax: [phone]   RxID:   [id number]  I have reviewed and approved all prescriptions at the time of this visit. Rollene Rotunda, MD, [facility]  June 17, 2010 1:38 PM

## 2010-09-18 NOTE — Miscellaneous (Signed)
Summary: Orders Update COUMADIN CLINIC ORDER  Clinical Lists Changes  Problems: Added new problem of ATRIAL FLUTTER (ICD-427.32) Orders: Added new Referral order of [facility]. Coumadin Clinic Referral (Coumadin clinic) - Signed

## 2010-09-18 NOTE — Medication Information (Signed)
Summary: rov/tm  Anticoagulant Therapy  Managed by: Bethena Midget, RN, BSN Referring MD: Antoine Poche MD, Fayrene Fearing PCP: Lalla Brothers Supervising MD: Excell Seltzer MD, Casimiro Needle Indication 1: Atrial Fibrillation Lab Used: [facility] [place] Site: [address] INR POC 2.8 INR RANGE 2.0-3.0  Dietary changes: no    Health status changes: no    Bleeding/hemorrhagic complications: no    Recent/future hospitalizations: no    Any changes in medication regimen? no    Recent/future dental: no  Any missed doses?: no       Is patient compliant with meds? yes       Allergies: 1)  ! Penicillin 2)  ! Lisinopril (Lisinopril)  Anticoagulation Management History:      The patient is taking warfarin and comes in today for a routine follow up visit.  Positive risk factors for bleeding include an age of 75 years or older, history of CVA/TIA, and presence of serious comorbidities.  The bleeding index is 'high risk'.  Positive CHADS2 values include History of HTN, Age > 75 years old, History of Diabetes, and Prior Stroke/CVA/TIA.  Her last INR was 4.8 ratio.  Anticoagulation responsible provider: Excell Seltzer MD, Casimiro Needle.  INR POC: 2.8.  Cuvette Lot#: [id number].  Exp: 11/2010.    Anticoagulation Management Assessment/Plan:      The next INR is due 09/30/2009.  Anticoagulation instructions were given to patient.  Results were reviewed/authorized by Bethena Midget, RN, BSN.  She was notified by Bethena Midget, RN, BSN.         Prior Anticoagulation Instructions: INR 1.5 Change dose to 1 pill everyday except  1 1/2 pills on Wednesdays. Recheck in one week per Eda Keys, Arlester Marker D  Current Anticoagulation Instructions: INR 2.8 Change 1 pill everyday. Recheck in one week.

## 2010-09-18 NOTE — Progress Notes (Signed)
Summary: repeat PT/INR 8.29.11  Phone Note Call from Patient Call back at Home Phone [phone]   Caller: Patient Call For: nadel Summary of Call: pt wants to know when she can come in for her protime.  Initial call taken by: Tivis Ringer, CNA,  April 04, 2010 4:25 PM  Follow-up for Phone Call        per the 8.8.11 PT results, pt is to return on 8.29.11 for repeat PT/INR.  called spoke with patient who states that she forgot which day she was supposed to come.  restated SN's recs.  pt verbalized her understanding. Follow-up by: Boone Master CNA/MA,  April 04, 2010 4:31 PM

## 2010-09-18 NOTE — Progress Notes (Signed)
Summary: protime  Phone Note Call from Patient Call back at Home Phone [phone]   Caller: Patient Call For: nadel Reason for Call: Talk to Nurse Summary of Call: need protime put in for lab.  Call pt and let her know when to be in. Initial call taken by: Eugene Gavia,  March 18, 2010 1:31 PM  Follow-up for Phone Call        pt states she is due for PT INR.  Looks like last PT INR drawn [phone].  Please advise when pt need to schedule lab appt to have f/u PT INR drawn.  thank you.  Aundra Millet Reynolds LPN  March 18, 2010 1:50 PM    please have pt come in 8-8 or sometime that week for her protime to be drawn.  have her call the next day for her results. thanks  i put her lab in idx Randell Loop CMA  March 19, 2010 9:01 AM   [facility] x 1 Zackery Barefoot [facility]  March 19, 2010 9:32 AM   Additional Follow-up for Phone Call Additional follow up Details #1::        Spoke with pt.  Pt advised to come in on 8-811 or sometime that week for PT and to call the day after she comes in for results.  She verbalized understanding.  Gweneth Dimitri RN  March 19, 2010 9:37 AM

## 2010-09-18 NOTE — Medication Information (Signed)
Summary: Coumadin Clinic  Anticoagulant Therapy  Managed by: Inactive Referring MD: Antoine Poche MD, Fayrene Fearing PCP: Lalla Brothers Supervising MD: Excell Seltzer MD, Casimiro Needle Indication 1: Atrial Fibrillation Lab Used: [facility] [place] Site: [address] INR RANGE 2.0-3.0          Comments: Dr. Kriste Basque now following. Has appt  on 09/30/09 with him.   Allergies: 1)  ! Penicillin 2)  ! Lisinopril (Lisinopril)  Anticoagulation Management History:      Positive risk factors for bleeding include an age of 38 years or older, history of CVA/TIA, and presence of serious comorbidities.  The bleeding index is 'high risk'.  Positive CHADS2 values include History of HTN, Age > 26 years old, History of Diabetes, and Prior Stroke/CVA/TIA.  Her last INR was 4.8 ratio.  Anticoagulation responsible provider: Excell Seltzer MD, Casimiro Needle.  Exp: 11/2010.    Anticoagulation Management Assessment/Plan:      The next INR is due 09/30/2009.  Anticoagulation instructions were given to patient.  Results were reviewed/authorized by Inactive.         Prior Anticoagulation Instructions: INR 2.8 Change 1 pill everyday. Recheck in one week.

## 2010-09-18 NOTE — Assessment & Plan Note (Signed)
Summary: 3 MONTH/DMP  Medications Added CARVEDILOL 12.5 MG TABS (CARVEDILOL) ONE TWICE A DAY      Allergies Added:   Visit Type:  Follow-up Primary Provider:  Lorin Picket Nadel,MD  CC:  Tachycardia.  History of Present Illness: The patient presents for followup of tachycardia. She had been describing palpitations and wore an event monitor. This demonstrated tachycardia arrhythmias with a rate of 150. It is regular and the mechanism is not clear though it is the same morphology as her sinus rhythm. I do not suspect ventricular tachycardia. She will feel this when she becomes emotionally stressed only. She does not feel it when she is calm and rested. She argues with her boyfriend and this may happen perhaps 2 or 3 times a month. She cannot bring it on with physical exertion. She does not have presyncope or syncope but feels her heart beating fast. She may have a little chest discomfort with it. It'll go away after she calm down after a few minutes. She otherwise is able to be active and does not describe shortness of breath, PND or orthopnea. She otherwise does not describe chest pressure, neck or arm discomfort. I reviewed the rhythm strips today.  Current Medications (verified): 1)  Carvedilol 6.25 Mg Tabs (Carvedilol) .... Take One Tablet By Mouth Twice A Day 2)  Cozaar 100 Mg Tabs (Losartan Potassium) .... Take 1 Tab By Mouth Once Daily.Marland KitchenMarland Kitchen 3)  Glucophage 500 Mg Tabs (Metformin Hcl) .... Take 1/2  Tab By Mouth Two Times A Day W/ Breakfast & Dinner... 4)  Glimepiride 1 Mg Tabs (Glimepiride) .... Take 1 Tab By Mouth Once Daily (In Am).Marland KitchenMarland Kitchen 5)  Omeprazole 20 Mg Tbec (Omeprazole) .... Take 1 Tablet By Mouth Once A Day 30 Minutes Before Breakfast. 6)  Vicodin 5-500 Mg Tabs (Hydrocodone-Acetaminophen) .... Take One Tablet By Mouth Every 6 Hours As Needed For Pain 7)  Coumadin 5 Mg Tabs (Warfarin Sodium) .... Take 1/2  Tablet By Mouth Once A Day 8)  Pravachol 40 Mg Tabs (Pravastatin Sodium) .... Take One  Tablet By Mouth At Bedtime  Allergies (verified): 1)  ! Penicillin 2)  ! 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 Left Knee surgery Right Shoulder surgery Lesion ablation of ulcer  Review of Systems       Orthostatic symptoms (mild).  Otherwise as stated in the HPI and negative for all other systems.  Vital Signs:  Patient profile:   77 year old female Height:      62 inches Weight:      137 pounds BMI:     25.15 Pulse rate:   93 / minute Resp:     16 per minute BP sitting:   180 / 99  (right arm)  Vitals Entered By: Marrion Coy, CNA (October 08, 2009 3:10 PM)  Physical Exam  General:  Well developed, well nourished, in no acute distress. Head:  normocephalic and atraumatic Eyes:  PERRLA/EOM intact; conjunctiva and lids normal. Mouth:  Poor dentition, gums and palate normal. Oral mucosa  normal. Neck:  Neck supple, no JVD. No masses, thyromegaly or abnormal cervical nodes. Chest Wall:  no deformities or breast masses noted Lungs:  Clear bilaterally to auscultation and percussion. Abdomen:  Bowel sounds positive; abdomen soft and non-tender without masses, organomegaly, or hernias noted. No  hepatosplenomegaly. Msk:  Back normal, normal gait. Muscle strength and tone normal. Extremities:  No clubbing or cyanosis. Neurologic:  Alert and oriented x 3. Skin:  Intact without lesions or rashes. Cervical Nodes:  no significant adenopathy Axillary Nodes:  no significant adenopathy Inguinal Nodes:  no significant adenopathy Psych:  Normal affect.   Detailed Cardiovascular Exam  Neck    Carotids: Carotids full and equal bilaterally without bruits.      Neck Veins: Normal, no JVD.    Heart    Inspection: no deformities or lifts noted.      Palpation: normal PMI with no thrills palpable.      Auscultation: regular rate and rhythm, S1, S2 without murmurs, rubs, gallops, or clicks.    Vascular    Abdominal Aorta: no palpable masses, pulsations, or audible bruits.      Femoral Pulses: normal femoral pulses bilaterally.      Pedal Pulses: dorsalis pedis intact bilat.      Radial Pulses: normal radial pulses bilaterally.      Peripheral Circulation: no clubbing, cyanosis, or edema noted with normal capillary refill.     Impression & Recommendations:  Problem # 1:  TACHYCARDIA (ICD-785.0) The patient has tachycardia as described. It seems to be associated with emotional stress. She is already on Coumadin. At this point I will increase her beta blocker to manage blood pressure, cardiomyopathy and the tachycardia arrhythmias. She reluctantly agrees to this and will start taking carvedilol 12.5 mg b.i.d.  Problem # 2:  HYPERTENSION (ICD-401.9) Her blood pressure is not well controlled and it will be treated in the context of managing the above.  Problem # 3:  CARDIOMYOPATHY (ICD-425.4) I will continue to titrate her meds as her blood pressure and she will allow.  Patient Instructions: 1)  Your physician recommends that you schedule a follow-up appointment in: 2 MONTHS WITH DR [facility] 2)  Your physician has recommended you make the following change in your medication: INCREASE  CARVEDILOL TO 12.5 MG TWICE A DAY 3)  You have been diagnosed with Congestive Heart Failure or CHF.  CHF is a condition in which a problem with the structure or function of the heart impairs its ability to supply sufficient blood flow to meet the body's needs.  For further information please visit www.cardiosmart.org for detailed information on CHF. 4)  Your physician recommends that you weigh, daily, at the same time every day, and in the same amount of clothing.  Please record your daily weights on the handout provided and bring it to your next appointment. Prescriptions: CARVEDILOL 12.5 MG TABS (CARVEDILOL) ONE TWICE A DAY  #60 x 11   Entered by:   Charolotte Capuchin, RN   Authorized by:   Rollene Rotunda, MD, [facility]   Signed by:   Charolotte Capuchin, RN on 10/08/2009   Method used:   [facility]        [facility]  [address] (retail)       [address]       [place], [place]  [address]       Ph: [phone] or [phone]       Fax: [phone]   RxID:   [id number]

## 2010-09-18 NOTE — Progress Notes (Signed)
 ----   Converted from flag ---- ---- 09/17/2009 5:24 PM, Rollene Rotunda, MD, [facility] wrote: I will talk about this after I see the 21 day event monitor.  ---- 09/17/2009 1:38 PM, Charolotte Capuchin, RN wrote: ANY RESTRICTIONS?  ---- 09/17/2009 11:32 AM, Bethena Midget, RN, BSN wrote: Elita Quick, Pt is wondering about her activity restrictions if any? Wanting to know about weight( carries wood) and also sex? Please call her at home at [phone] or cell at [phone] ------------------------------  Phone Note Outgoing Call   Call placed by: Charolotte Capuchin, RN,  September 18, 2009 10:38 AM Call placed to: Patient Summary of Call: Called patient and left message on machine for pt that Dr Antoine Poche will discuss her questions once he has the 21 day monitor results Initial call taken by: Charolotte Capuchin, RN,  September 18, 2009 10:39 AM  Follow-up for Phone Call       Follow-up by: Charolotte Capuchin, RN,  September 18, 2009 10:38 AM

## 2010-09-18 NOTE — Assessment & Plan Note (Signed)
Summary: Primary svc/ right knee pain ? etiology   Copy to:  Alroy Dust, MD Primary Provider/Referring Provider:  Lalla Brothers  CC:  Acute visit.  Pt c/o rt knee pain x 1 month.  She states that since this am the pain is also in her rt calf.  .  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 treated w/ PPI & PYLERA (broken down to the generic Bismuth, Metronidazole, Tetracycline- she is Penicillin allergic)...  ~  [facility] 7/13-21/10 w/ pleuritic right CP- eval revealed +PTE and f/u 2DEcho showed infer AK, septal dysynergy, atrial septal aneurym, EF= 30-35%... placed on Heparin & bridged to Coumadin Rx (needs very careful monitoring due to duod ulcer & prev hx SAH in 2001 from an AVM)...   ~  March 11, 2009:  post hospital visit & doing satis since discharge... taking Coumadin as directed and she is finishing her HPylori treatment... feels better- denies CP, SOB, abd pain, n/v, etc... we discussed the need for careful monitoring of protimes, Cards f/u in light of recent findings, GI f/u after Rx for HPylori... we will check labs today & f/u w/ coumadin clinic...   ~  April 29, 2009:  she is stable... she had f/u DrStart 8/27- "the bug is gone" w/ readication of the HPylori... she is taking the Coumadin 5mg  tabs- 1 tab daily x5d, 1/2 tab x2d= MW..   May 20, 2009 --Returns for post hospital. . Admitted 9/28-10/2/10 for chest pain, ruled out for MI,  CT angiogram negative for pulmonary  emboli. Venous dopplers neg for dvt. . Changed off bisoprolol , to coreg d/t bradycardia.  Here today for PT/INR>  . Since discharge , chest pain has resolved.  Continues with joint pain esp in knees right > left. Denies chest pain,  orthopnea, hemoptysis, fever, n/v/d, edema, headache.   November 08, 2009 Acute visit.  Pt c/o rt knee pain x 1 month.  She states that since this am the pain is  also in her rt calf.  no swelling of leg or ankle.  Pain worse walking . no fever. No back pain or numbness.    Current Medications (verified): 1)  Carvedilol 12.5 Mg Tabs (Carvedilol) .... One Twice A Day 2)  Cozaar 100 Mg Tabs (Losartan Potassium) .... Take 1 Tab By Mouth Once Daily.Marland KitchenMarland Kitchen 3)  Glucophage 500 Mg Tabs (Metformin Hcl) .... Take 1/2  Tab By Mouth Two Times A Day W/ Breakfast & Dinner... 4)  Glimepiride 1 Mg Tabs (Glimepiride) .... Take 1 Tab By Mouth Once Daily (In Am).Marland KitchenMarland Kitchen 5)  Omeprazole 20 Mg Tbec (Omeprazole) .... Take 1 Tablet By Mouth Once A Day 30 Minutes Before Breakfast. 6)  Vicodin 5-500 Mg Tabs (Hydrocodone-Acetaminophen) .... Take One Tablet By Mouth Every 6 Hours As Needed For Pain 7)  Coumadin 5 Mg Tabs (Warfarin Sodium) .... As Directed Per Coumadin Clinic 8)  Pravachol 40 Mg Tabs (Pravastatin Sodium) .... Take One Tablet By Mouth At Bedtime  Allergies (verified): 1)  ! Penicillin 2)  ! 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) NEPHROLITHIASIS (ICD-592.0) DEGENERATIVE JOINT DISEASE (ICD-715.90)................................Marland KitchenCaffrey Hx of ARTERIOVENOUS MALFORMATION (ICD-747.60) Hx of TRANSIENT ISCHEMIC ATTACK (ICD-435.9) NEUROPATHY (ICD-355.9) ANXIETY (ICD-300.00)  Vital Signs:  Patient profile:   77 year old female Weight:      138 pounds O2 Sat:      97 % on Room air Temp:     97.5 degrees F oral Pulse rate:   75 / minute BP sitting:   124 / 84  (left arm)  Vitals  Entered By: Vernie Murders (November 08, 2009 12:06 PM)  O2 Flow:  Room air  Physical Exam  Additional Exam:  GEN: A/Ox3; pleasant , NAD wt 137 November 09, 2009  HEENT:  [place]/AT, , EACs-clear, TMs-wnl, NOSE-clear, THROAT-clear NECK:  Supple w/ fair ROM; no JVD; normal carotid impulses w/o bruits; no thyromegaly or nodules palpated; no lymphadenopathy. RESP  Clear to P & A; w/o, wheezes/ rales/ or rhonchi. CARD:  RRR, no m/r/g   GI:   Soft & nt; nml bowel sounds; no organomegaly or masses detected. Musco: Warm bil,  no calf tenderness edema, clubbing, pulses intact neg slr on right Right knee and calf completely wnl, pulses sym reduced both feet, neg homans Neuro: intact with no focal deficits noted .   Impression & Recommendations:  Problem # 1:  KNEE PAIN (ICD-719.46)  ? etilolgy, no evidence of knee effusion, crepitance, reduced rom, or calf findings, already on coumadin therapeutic, no evidence hematoma. rec f/u ortho  Orders: Est. Patient Level III (47829)  Medications Added to Medication List This Visit: 1)  Coumadin 5 Mg Tabs (Warfarin sodium) .... As directed per coumadin clinic  Patient Instructions: 1)  Use vicodin if  pain gets worse 2)  Try heating pad 3)  Call Dr Madelon Lips for follow up

## 2010-09-18 NOTE — Assessment & Plan Note (Signed)
Summary: 3 MOS F/U #/CD   Vital Signs:  Patient profile:   77 year old female Height:      62 inches (157.48 cm) Weight:      137.25 pounds (62.39 kg) BMI:     25.19 O2 Sat:      97 % on Room air Temp:     98.1 degrees F (36.72 degrees C) oral Pulse rate:   65 / minute BP sitting:   160 / 102  (left arm) Cuff size:   regular  Vitals Entered By: Brenton Grills CMA Duncan Dull) (August 01, 2010 11:39 AM)  O2 Flow:  Room air CC: 3 month F/U/aj Is Patient Diabetic? Yes CBG Result 230   Referring Raffael Bugarin:  Alroy Dust, MD Primary Analee Montee:  Lalla Brothers  CC:  3 month F/U/aj.  History of Present Illness: the status of at least 3 ongoing medical problems is addressed today: pt says metformin at full dosage causes heartburn, so she has reduce to just 2x500 mg once daily.  this resolved her sxs.  no cbg record, but states cbg's are well-controlled, except 200's after eating.   chf: is well-controlled.  no sob.   renal insuff: she denies n/v  Current Medications (verified): 1)  Coumadin 5 Mg Tabs (Warfarin Sodium) .... As Directed Per Coumadin Clinic 2)  Carvedilol 12.5 Mg Tabs (Carvedilol) .... One and 1/2 Tablets Twice A Day 3)  Cozaar 100 Mg Tabs (Losartan Potassium) .... Take 1 Tab By Mouth Once Daily.Marland KitchenMarland Kitchen 4)  Pravastatin Sodium 40 Mg Tabs (Pravastatin Sodium) .Marland Kitchen.. 1 At Bedtime - Hold Due To Muscle Pain 5)  Metformin Hcl 500 Mg Xr24h-Tab (Metformin Hcl) .... 4 Tabs Each Am 6)  Glimepiride 4 Mg Tabs (Glimepiride) .Marland Kitchen.. 1 Tab Each Am 7)  Vicodin 5-500 Mg Tabs (Hydrocodone-Acetaminophen) .... Take One Tablet By Mouth Every 6 Hours As Needed For Pain 8)  Soma 350 Mg Tabs (Carisoprodol) .... Take 1 Tab By Mouth Three Times A Day As Needed For Muscle Spasm... 9)  Multivitamins  Tabs (Multiple Vitamin) .... Take 1 Tablet By Mouth Once A Day  Allergies (verified): 1)  ! Penicillin 2)  ! Lisinopril (Lisinopril)  Past History:  Past Medical History: Last updated: 05/05/2010 Hx of  PULMONARY EMBOLISM (ICD-415.19) HYPERTENSION (ICD-401.9) CORONARY ARTERY DISEASE (ICD-414.00) CARDIOMYOPATHY (ICD-425.4) PALPITATIONS (ICD-785.1) PAROXYSMAL ATRIAL FIBRILLATION (ICD-427.31) HYPERCHOLESTEROLEMIA (ICD-272.0) DIABETES MELLITUS (ICD-250.00) GOITER, MULTINODULAR (ICD-241.1) PEPTIC ULCER DISEASE, HELICOBACTER PYLORI POSITIVE (ICD-533.90) GERD (ICD-530.81) DIVERTICULOSIS OF COLON (ICD-562.10) Hx of BENIGN NEOPLASM OF ADRENAL GLAND (ICD-227.0) NEPHROLITHIASIS (ICD-592.0) DEGENERATIVE JOINT DISEASE (ICD-715.90)................................Marland KitchenCaffrey Hx of ARTERIOVENOUS MALFORMATION (ICD-747.60) Hx of TRANSIENT ISCHEMIC ATTACK (ICD-435.9) NEUROPATHY (ICD-355.9) ANXIETY (ICD-300.00)  Review of Systems  The patient denies hypoglycemia, weight loss, and weight gain.    Physical Exam  General:  normal appearance.   Extremities:  no edema    Impression & Recommendations:  Problem # 1:  DIABETES MELLITUS (ICD-250.00) apparently needs increased rx  Problem # 2:  RENAL INSUFFICIENCY (ICD-588.9) mild at this creat level, metformin is ok  Problem # 3:  CARDIOMYOPATHY (ICD-425.4) this is a relative contraindication to actos  Problem # 4:  GERD (ICD-530.81) exac by full-dose metformin  Medications Added to Medication List This Visit: 1)  Metformin Hcl 500 Mg Xr24h-tab (Metformin hcl) .... 2 tabs each am 2)  Januvia 100 Mg Tabs (Sitagliptin phosphate) .Marland Kitchen.. 1 tab each am  Other Orders: Glucose, (CBG) (16109) Est. Patient Level IV (60454)  Patient Instructions: 1)  Please schedule a follow-up appointment in 3 months. 2)  check your blood sugar 1 time a day.  vary the time of day when you check, between before the 3 meals, and at bedtime.  also check if you have symptoms of your blood sugar being too high or too low.  please keep a record of the readings and bring it to your next appointment here.  please call us sooner if you are having low blood sugar episodes.   3)   blood tests are being ordered for you today.  please call [phone] to hear your test results. 4)  pending the test results, please add januvia 100 mg each am. Prescriptions: JANUVIA 100 MG TABS (SITAGLIPTIN PHOSPHATE) 1 tab each am  #30 x 11   Entered and Authorized by:   Minus Breeding MD   Signed by:   Minus Breeding MD on 08/01/2010   Method used:   [facility] to        [facility] [id number]retail[facility]       [address]       [place], [place]  [address]       Ph: [phone] or [phone]       Fax: [phone]   RxID:   [id number]    Orders Added: 1)  Glucose, (CBG) [82962] 2)  Est. Patient Level IV [38756]    Laboratory Results   Blood Tests     CBG Random:: 230mg /dL

## 2010-09-18 NOTE — Progress Notes (Signed)
Summary: results  Phone Note Call from Patient   Caller: Patient Call For: nadel Summary of Call: pt calling for lab results Initial call taken by: Rickard Patience,  April 17, 2010 1:50 PM  Follow-up for Phone Call        per SN---not thin enough on the coumadin 5mg    1/2 tab daily---recs are to increase to 1 tab by mouth x 1 day on monday and 1/2 tab x 6 days  t,w,th,f,s,s and recheck in 1 month the week of sept 27th.  [email or web address] Randell Loop CMA  April 17, 2010 4:14 PM    Patient calling back about lab results. Follow-up by: Lehman Prom,  April 22, 2010 12:58 PM  Additional Follow-up for Phone Call Additional follow up Details #1::        lmomtcb for pt---have been unable to reach her since 9-6 and was not aware that she called back.   Randell Loop [facility]  April 24, 2010 4:47 PM     Additional Follow-up for Phone Call Additional follow up Details #2::    pt returned my call this am and spoke with TD about her results and to return to the lab on oct 3 for repeat of protime and  she is also aware to call the next day for her results. Randell Loop [facility]  April 25, 2010 2:49 PM    Appended Document: results when pt called and spoke with TD for her protime results she was instructed to take the coumadin 5mg ---her protime was not thin enough so  SN increased her dosing to 1 tab x 1 day on monday and 1/2 tab x 6 days on t,w,th,f,s,s and she will return on oct 3 for these repeat protime check.

## 2010-09-18 NOTE — Progress Notes (Signed)
Summary: protime results  Phone Note Call from Patient Call back at Home Phone [phone]   Caller: Patient Call For: nadel Reason for Call: Lab or Test Results Summary of Call: Patient requesting protime results. Initial call taken by: Lehman Prom,  June 17, 2010 2:25 PM  Follow-up for Phone Call        pt advised of PT results per append. Carron Curie CMA  June 17, 2010 3:38 PM

## 2010-09-18 NOTE — Assessment & Plan Note (Signed)
Summary: 3 MTH FU  STC   Vital Signs:  Patient profile:   77 year old female Weight:      134 pounds BMI:     24.60 Temp:     97.1 degrees F Pulse rate:   60 / minute BP sitting:   144 / 88  (left arm) Cuff size:   regular  Vitals Entered By: Lamar Sprinkles, CMA (February 03, 2010 11:23 AM)   Referring Provider:  Alroy Dust, MD Primary Provider:  Lorin Picket Nadel,MD   History of Present Illness: no cbg record, but states cbg's are 97-199. pt states she feels well in general.  pt says she misses her medication frequently.  Current Medications (verified): 1)  Coumadin 5 Mg Tabs (Warfarin Sodium) .... As Directed Per Coumadin Clinic 2)  Carvedilol 12.5 Mg Tabs (Carvedilol) .... One Twice A Day 3)  Cozaar 100 Mg Tabs (Losartan Potassium) .... Take 1 Tab By Mouth Once Daily.Marland KitchenMarland Kitchen 4)  Glucophage 500 Mg Tabs (Metformin Hcl) .... Take 1  Tab By Mouth Two Times A Day W/ Breakfast & Dinner... 5)  Glimepiride 1 Mg Tabs (Glimepiride) .... Take 1 Tab By Mouth Once Daily (In Am).Marland KitchenMarland Kitchen 6)  Omeprazole 20 Mg Tbec (Omeprazole) .... Take 1 Tablet By Mouth Once A Day 30 Minutes Before Breakfast. 7)  Vicodin 5-500 Mg Tabs (Hydrocodone-Acetaminophen) .... Take One Tablet By Mouth Every 6 Hours As Needed For Pain 8)  Multivitamins  Tabs (Multiple Vitamin) .... Take 1 Tablet By Mouth Once A Day 9)  Soma 350 Mg Tabs (Carisoprodol) .... Take 1 Tab By Mouth Three Times A Day As Needed For Muscle Spasm... 10)  Pravastatin Sodium 40 Mg Tabs (Pravastatin Sodium) .Marland Kitchen.. 1 At Bedtime - Hold Due To Muscle Pain  Allergies (verified): 1)  ! Penicillin 2)  ! Lisinopril (Lisinopril)  Past History:  Past Medical History: Last updated: 01/17/2010 Hx of PULMONARY EMBOLISM (ICD-415.19) HYPERTENSION (ICD-401.9) CORONARY ARTERY DISEASE (ICD-414.00) CARDIOMYOPATHY (ICD-425.4) PALPITATIONS (ICD-785.1) PAROXYSMAL ATRIAL FIBRILLATION (ICD-427.31) HYPERCHOLESTEROLEMIA (ICD-272.0) DIABETES MELLITUS (ICD-250.00) GOITER,  MULTINODULAR (ICD-241.1) PEPTIC ULCER DISEASE, HELICOBACTER PYLORI POSITIVE (ICD-533.90) GERD (ICD-530.81) DIVERTICULOSIS OF COLON (ICD-562.10) Hx of BENIGN NEOPLASM OF ADRENAL GLAND (ICD-227.0) NEPHROLITHIASIS (ICD-592.0) DEGENERATIVE JOINT DISEASE (ICD-715.90)................................Marland KitchenCaffrey Hx of ARTERIOVENOUS MALFORMATION (ICD-747.60) Hx of TRANSIENT ISCHEMIC ATTACK (ICD-435.9) NEUROPATHY (ICD-355.9) ANXIETY (ICD-300.00)  Review of Systems  The patient denies hypoglycemia.    Physical Exam  General:  normal appearance.   Msk:  gait is normal and steady    Impression & Recommendations:  Problem # 1:  DIABETES MELLITUS (ICD-250.00) HgbA1C: 9.4 (01/17/2010) therapy limited by noncompliance.  i'll do the best i can.  Medications Added to Medication List This Visit: 1)  Pravastatin Sodium 40 Mg Tabs (Pravastatin sodium) .Marland Kitchen.. 1 at bedtime - hold due to muscle pain 2)  Metformin Hcl 500 Mg Xr24h-tab (Metformin hcl) .... 4 tabs each am 3)  Glimepiride 4 Mg Tabs (Glimepiride) .Marland Kitchen.. 1 tab each am  Other Orders: Est. Patient Level III (11914)  Patient Instructions: 1)  increase glimepiride to 4 mg each am. 2)  change metformin to extended-release 4x500 mg each am. 3)  please make every effort to remember your medications. 4)  Please schedule a follow-up appointment in 3 months. 5)  check your blood sugar 1 time a day.  vary the time of day when you check, between before the 3 meals, and at bedtime.  also check if you have symptoms of your blood sugar being too high or too low.  please keep a record  of the readings and bring it to your next appointment here.  please call us sooner if you are having low blood sugar episodes. Prescriptions: GLIMEPIRIDE 4 MG TABS (GLIMEPIRIDE) 1 tab each am  #30 x 11   Entered and Authorized by:   Minus Breeding MD   Signed by:   Minus Breeding MD on 02/03/2010   Method used:   [facility]        [facility] [id number]  [facility]       [address]       [place], [place]  [address]       Ph: [phone] or [phone]       Fax: [phone]   RxID:   [id number] METFORMIN HCL 500 MG XR24H-TAB (METFORMIN HCL) 4 tabs each am  #120 x 11   Entered and Authorized by:   Minus Breeding MD   Signed by:   Minus Breeding MD on 02/03/2010   Method used:   [facility]        [facility] [facility]retail)       [address]       [place], [place]  [address]       Ph: [phone] or [phone]       Fax: [phone]   RxID:   [id number]

## 2010-09-18 NOTE — Assessment & Plan Note (Signed)
Summary: 6 month return/mhh   Primary Care Provider:  Lorin Picket Fernand Sorbello,MD  CC:  4 month ROV 7 review of mult medical problems....  History of Present Illness: 77 y/o BF here for a follow up visit... he has multiple medical problems as noted below...     ~  ZOX09:  she had f/u DrHochrein c/o palpit & 21d holter showed PAF- he restarted Coumadin but she refuses CC due to the $$$ & we will follow protimes at [facility] starting today... her blood sugars are "all over the place" - medication compliance remains poor & we discussed all these issues...  ~  Jun11:  she continues to have a major prob w/ compliance- stopped Prav40 due to groin pain (which did not resolve off the med); last protime= 6+wks ago "the nurse must have made a mistake" & states she's taking 5mg  tabs- 1/2 tab daily... didn't bring meds to office as requested- "no changes"- long hx chronic non-compliance w/ med Rx...  NOTE> labs today showed BS=238, A1c=9.4, Creat=1.2, Umicralb=pos, & PT=15.3/ INR=1.5> she claims she is taking the Metform500Bid & Glimep1mg /d; call to pharm showed that she filled #30 Metform 12/24/09 & 10/23/09; and filled the Glimep 1mg  on 11/12/09 & 09/09/09...   ~  May 05, 2010:  once again she did not bring meds to visit or list- "same thing, you have it right there" & again asked to bring all meds to every visit... BP controlled when she takes meds regularly;  admits to only taking the Prav40 "now & again";  not checking BS at home &she wants to know "why can't I just incr my intake of cinnamon, garlic, and beets?"... she saw DrEllison 6/11- note reviewed "therapy limited by noncompliance".   ~  September 02, 2010:  she has mult somatic complaints... Coumadin recently decr to 1/2 tab x 7d & continues monitoring thru [place] office protocol... she saw DrEllison 12/11- states he reviewed 3 on-going medical problems, added Januvia 100mg /d but pt never got the message, & never picked up the medication... she saw DrHochrein 11/11 & he  up-titrated her Coreg to 18.75mg  Bid (12.5mg  tabs- 1.5tabs Bid) & tol well... BP control remains fair> ?what she's actually taking "my home remedy of vinegar & garlic is helping my BP" she says... NOTE> labs today showed BS=122, A1c=8.5, & rec to take Metformin/ Glimep as perscribed & add the Januvia 100mg /d.    Current Problem List:  Hx of PULMONARY EMBOLISM (ICD-415.19) - presented w/ pleuritic right CP 7/10 w/ CT angio showing pulm emboli & Rx w/ hep> coumadin... protimes followed w/ the [place] office protocol (she has refused CC)...  ~  hosp by Cards 9/10 for CP- she ruled out for MI & had f/u CT Angio- NEG for emboli (prev emboli resolved), & VenDopplers- NEG for DVT...  ~  Protime 11/10= 49.4/ INR 4.8 (on Coumadin 5mg /d) & she's hi risk for bleed w/ hx DU & AVM- therefore coumadin stopped.  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  HYPERTENSION (ICD-401.9) - long hx of signif HBP w/ hosp for [facility] in 2001 by DrKritzer (due to an AVM)... previously on Toprol XL & Lotrel- developed an ACE cough... long hx non-compliance w/ med Rx...  she denies HA, fatigue, visual changes, CP, palipit, dizziness, syncope, dyspnea, edema, etc... now on COREG 18.75mg  Bid +  COZAAR 100mg /d... she prefers garlic and lemon juice!!!   ~  4/10:  BP= 160/90 today, pt supposed to be on Bisoprolol 2.5mg  daily, and add Losartan 100mg /d.  ~  7/10:  BP= 140/80 on Ziac2.5 + Cozaar100... reminded to take meds every day!!!  ~  9/10:  BP= 132/82 on Ziac2.5 + Cozaar 100... continue same!  ~  10/10: Hosp by Cards and meds changed- COREG 3.125Bid + COZAAR100- 1/2 tab/d...   ~  11/10:  BP today= 146/90 & states she doesn't feel well- rec incr Cozaar back to 100mg /d.  ~  2/11:  BP= 154/90 & supposed to be on Coreg 6.25Bid & Losartan 100mg /d- Coreg incr to 12.5Bid.  ~  9/11:  BP= 150/90  & pt reminded to take meds every day!  ~  11/11:  DrHochrein up-titrated Coreg to 18.75mg  Bid, +Cozaar 100mg /d, BP= 142/84> ?compliance.  CORONARY ARTERY DISEASE (ICD-414.00) & CARDIOMYOPATHY (ICD-425.4) - followed by DrHochrein for Cards> she is ACE intolerant w/ cough, and has long hx of non-compliance w/ med Rx.  ~  abn EKG w/ LBBB...  ~  2DEcho 3/10 showed diffuse LV HK w/ EF ~25%, mild calcif AoV, mild MR, mod dil LA & atrial septal aneurysm.  ~  NuclearStressTest 2/10 showed cardiomyop w/ diffuse HK & EF ~35%.  ~  Cath 11/15/08 w/ EF= 30-35%, & non-obstructive CAD w/ 30-40% midLAD, 40-70% Diag branch of LAD, calcif in proxCIRC, & 30% midRCA.  ~  Repeat 2DECho 7/10 showed infer AK, septal dysynergy, atrial septal aneurym, EF= 30-35%...  ~  Hosp 10/10 by Cards & r/o for MI, meds adjusted as noted.  PAROXYSMAL ATRIAL FIBRILLATION (ICD-427.31) - eval for palpit by DrHochrein w/ PAF on Holter> he ordered Coumadin restarted 2/11... she refused coumadin clinic, therefore protimes followed in [place] office.  HYPERCHOLESTEROLEMIA (ICD-272.0) - she refused meds in past & preferred Rx w/ red yeast rice, oatmeal, herbal remedies.  ~  FLP 11/07 showed TChol 285, TG 155, HDL 80, LDL 165... obviously not taking med.  ~  FLP 10/09 showed TChol 294, TG 205, HDL 78, LDL 157... she does not want to start statin Rx.  ~  Discussed starting Rx in light of her cardiomyopathy & CAD on cath- she refuses statin Rx.  ~  FLP 4/10 on diet showed TChol 211, TG 48, HDL 94, LDL 98... rec> keep up the great work!  ~  FLP 2/11 on diet showed TChol 278, TG 303, HDL 74, LDL 154... rec> try PRAVACHOL40.  ~  pt admits to taking the Prav40 "only now & again"> asked to take it daily.  ~  FLP 1/12 showed TChol 198, TG 175, HDL 71, LDL 92  DIABETES MELLITUS (ICD-250.00) - long hx of chronic non-compliance w/ med Rx  ~  labs 10/09 showed BS= 319, HgA1c= 11.8.Marland KitchenMarland Kitchen re-started Metformin 500mg -2Bid + Glimepiride 4mg /d, but she c/o abd pain  on the Metformin and stopped the Glimepiride on her own... referred to Endocrine.  ~  DrEllison opted for Rx w/ ACTOS 45mg /d, & GLIMEPIRIDE 1mg - 1/2 tabAM (intol Metformin).  ~  labs 4/10 showed BS= 102, A1c= 6.9.Marland KitchenMarland Kitchen continue same.  ~  7/10: regulated in hosp w/ GLUCOPHAGE 500- 1/2Bid + GLIMEPIRIDE 1mg /d... labs showed BS= 144, A1c= 7.4.  ~  9/10: labs showed BS= 270, A1c= 7.3.Marland KitchenMarland Kitchen prev intol to Metform500, therefore incr GLIMEPIRIDE 2mg Qam (she refused).  ~  11/10: Bs= 201, A1c= 8.4.Marland KitchenMarland Kitchen rec try to incr Metform500Bid & Glimep2mg /d...  ~  labs 2/11 showed BS= 188,  A1c= 9.0 (hasn't filled Metform in months & ?taking Glimep1mg ?).  ~  labs 6/11 showed BS= 238, A1c=9.4 (pharm confirms not taking meds regularly)... rec> take meds daily!  NOTE: Creat=1.2, Umicroalb=pos.  ~  followed by DrEllison every 3 months- he tried to add Januvia, she never filled the Rx.  ~  labs 1/12 showed BS= 122, A1c= 8.5.Marland KitchenMarland Kitchen rec to take Metform500Bid, Glimep2mg /d, Januvia100mg /d.  GERD (ICD-530.81) - previously on herbal remedies w/ prev eval from [facility] & DrNat-Mann in 1998...   ~  [facility] 6/10 w/ hemetemesis & UGI bleed from DU +HPylori... eval by [facility] & Rx w/ OMPEPRAZOLE 40mg  & PYLERA (broken down to the generic Bismuth, Metronidazole, Tetracycline- she is Penicillin allergic)  ~  GI f/u DrStark 9/10- HPylori eradicated, continue Omep40mg /d...  DIVERTICULOSIS OF COLON (ICD-562.10) -   ~  last procedure= FlexSig 1998 by DrStark showing divertics only...  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  NEPHROLITHIASIS (ICD-592.0)  DEGENERATIVE JOINT DISEASE (ICD-715.90) - uses VICODIN Prn...  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 sudden HA- eval revealed AVM  & conservative approach recommended by Neurosurgery w/ BP control...  NEUROPATHY (ICD-355.9)  ANXIETY (ICD-300.00)   Preventive Screening-Counseling & Management  Alcohol-Tobacco     Smoking Status: quit     Year Quit: 1985  Allergies: 1)  ! Penicillin 2)  ! Lisinopril (Lisinopril)  Comments:  Nurse/Medical Assistant: The patient's medications and allergies were reviewed with the patient and were updated in the Medication and Allergy Lists.  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 Diabetes in grandmother, but no dm in immediate family Heart disease in aunt lung and brainCancer in mother  Father died from cariovascular hemmorage, had glaucoma ?cancer-uncle No FH of Colon Cancer: mother had a goiter  Social History: Reviewed history from 04/12/2009 and no changes required. The patient is a widow.  She has one daughter who lives   about 8 miles away.  She is retired.  She quit smoking in 1985.  She   drinks wine occasionally.  quit 5-6 months ago   no caffeine Illicit Drug Use - no  Review of Systems      See  HPI       The patient complains of dyspnea on exertion.  The patient denies anorexia, fever, weight loss, weight gain, vision loss, decreased hearing, hoarseness, chest pain, syncope, peripheral edema, prolonged cough, headaches, hemoptysis, abdominal pain, melena, hematochezia, severe indigestion/heartburn, hematuria, incontinence, muscle weakness, suspicious skin lesions, transient blindness, difficulty walking, depression, unusual weight change, abnormal bleeding, enlarged lymph nodes, and angioedema.    Vital Signs:  Patient profile:   77 year old female Height:      62 inches Weight:      137.25  pounds BMI:     25.19 O2 Sat:      96 % on Room air Temp:     96.7 degrees F oral Pulse rate:   58 / minute BP sitting:   142 / 87  (left arm) Cuff size:   regular  Vitals Entered By: Randell Loop CMA (September 02, 2010 11:06 AM)  O2 Sat at Rest %:  96 O2 Flow:  Room air CC: 4 month ROV 7 review of mult medical problems... Is Patient Diabetic? Yes Pain Assessment Patient in pain? no      Comments meds updated today with pt---she stated that she has not started on the [place]   Physical Exam  Additional Exam:  WD, WN, 77 y/o BF in NAD... GENERAL:  Alert & oriented; pleasant & cooperative... HEENT:  [place]/AT, EOM-wnl, PERRLA, EACs-clear, TMs-wnl, NOSE-clear, THROAT-clear & wnl. NECK:  Supple w/ fairROM; no JVD; normal carotid impulses w/o bruits; no thyromegaly but sm nodule palpated; no lymphadenopathy. CHEST:  Clear to P & A; without wheezes/ rales/ or rhonchi. HEART:  Regular Rhythm; gr 1/6 SEM, no rubs or gallops heard... ABDOMEN:  Soft & nontender; normal bowel sounds; no organomegaly or masses detected. EXT: without deformities, mild arthritic changes; no varicose veins/ venous insuffic/ or edema. NEURO:  CN's intact; motor testing normal; sensory testing normal; gait normal & balance OK. DERM:  No lesions noted; no rash etc...    Impression & Recommendations:  Problem # 1:   Hx of PULMONARY EMBOLISM (ICD-415.19) This has resolved & the coumadin was stopped, but it was restarted by Cards 1/11 due to PAF... she has refused CC & her protimes are managed thru the [place] office... Her updated medication list for this problem includes:    Coumadin 5 Mg Tabs (Warfarin sodium) .Marland Kitchen... As directed per coumadin clinic  Problem # 2:  HYPERTENSION (ICD-401.9) BP is reasonable on these meds & she is encouraged to take meds regularly... Her updated medication list for this problem includes:    Carvedilol 12.5 Mg Tabs (Carvedilol) ..... One and 1/2 tablets twice a day    Cozaar 100 Mg Tabs (Losartan potassium) .Marland Kitchen... Take 1 tab by mouth once daily...  Orders: TLB-BMP (Basic Metabolic Panel-BMET) (80048-METABOL) TLB-Hepatic/Liver Function Pnl (80076-HEPATIC) TLB-CBC Platelet - w/Differential (85025-CBCD) TLB-Lipid Panel (80061-LIPID) TLB-TSH (Thyroid Stimulating Hormone) (84443-TSH) TLB-A1C / Hgb A1C (Glycohemoglobin) (83036-A1C)  Problem # 3:  CARDIOMYOPATHY (ICD-425.4) Followed by DrHochrein>  her Coreg was up-titrated to 18.75mg  two times a day at last visit... she is reminded to take meds every day...  Problem # 4:  PAROXYSMAL ATRIAL FIBRILLATION (ICD-427.31) Continue same meds... Her updated medication list for this problem includes:    Coumadin 5 Mg Tabs (Warfarin sodium) .Marland Kitchen... As directed per coumadin clinic    Carvedilol 12.5 Mg Tabs (Carvedilol) ..... One and 1/2 tablets twice a day  Problem # 5:  HYPERCHOLESTEROLEMIA (ICD-272.0) FLP is imoproved>  we reviewed low chol/ low fat diet, & reminded to take med daily... Her updated medication list for this problem includes:    Pravastatin Sodium 40 Mg Tabs (Pravastatin sodium) .Marland Kitchen... 1 at bedtime - hold due to muscle pain  Problem # 6:  DIABETES MELLITUS (ICD-250.00) DM control remains poor mostly due to poor compliance w. med Rx... she is asked to take meds regualrly, bring all bottles to office for review, etc... she  is non-compliant on all scores> asked to start Januvia 100mg  as suggested by DrEllison last month... Her updated medication list for this problem  includes:    Cozaar 100 Mg Tabs (Losartan potassium) .Marland Kitchen... Take 1 tab by mouth once daily...    Metformin Hcl 500 Mg Xr24h-tab (Metformin hcl) .Marland Kitchen... 2 tabs each am    Glimepiride 4 Mg Tabs (Glimepiride) .Marland Kitchen... 1 tab each am    Januvia 100 Mg Tabs (Sitagliptin phosphate) .Marland Kitchen... 1 tab each am  Problem # 7:  GERD (ICD-530.81) She would not take the Omep40... asked to use PRILOSEC OTC as needed...  Problem # 8:  DIVERTICULOSIS OF COLON (ICD-562.10) Followed by DrStark for GI...  Problem # 9:  OTHER MEDICAL PROBLEMS AS NOTED>>>  Complete Medication List: 1)  Coumadin 5 Mg Tabs (Warfarin sodium) .... As directed per coumadin clinic 2)  Carvedilol 12.5 Mg Tabs (Carvedilol) .... One and 1/2 tablets twice a day 3)  Cozaar 100 Mg Tabs (Losartan potassium) .... Take 1 tab by mouth once daily.Marland KitchenMarland Kitchen 4)  Pravastatin Sodium 40 Mg Tabs (Pravastatin sodium) .Marland Kitchen.. 1 at bedtime - hold due to muscle pain 5)  Metformin Hcl 500 Mg Xr24h-tab (Metformin hcl) .... 2 tabs each am 6)  Glimepiride 4 Mg Tabs (Glimepiride) .Marland Kitchen.. 1 tab each am 7)  Vicodin 5-500 Mg Tabs (Hydrocodone-acetaminophen) .... Take one tablet by mouth every 6 hours as needed for pain 8)  Soma 350 Mg Tabs (Carisoprodol) .... Take 1 tab by mouth three times a day as needed for muscle spasm... 9)  Multivitamins Tabs (Multiple vitamin) .... Take 1 tablet by mouth once a day 10)  Januvia 100 Mg Tabs (Sitagliptin phosphate) .Marland Kitchen.. 1 tab each am  Patient Instructions: 1)  Today we updated your med list- see below.... 2)  Continue your current meds the same + start the new Januvia that DrEllison suggested last month... 3)  Today we did your follow up Blood work... please call the "phone tree" in a few days for your lab results.Marland KitchenMarland Kitchen 4)  Call for any questions.Marland KitchenMarland Kitchen 5)  Please schedule a follow-up appointment in 4  months.   Immunization History:  Influenza Immunization History:    Influenza:  declined (09/02/2010)  Pneumovax Immunization History:    Pneumovax:  declined (09/02/2010)

## 2010-09-18 NOTE — Assessment & Plan Note (Signed)
Summary: rov/jd   Primary Care Provider:  Lorin Picket Makaylynn Bonillas,MD  CC:  4 month ROV & review of mult medical problems.  History of Present Illness: 77 y/o BF here for a follow up visit... he has multiple medical problems as noted below...     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 treated w/ PPI & PYLERA (broken down to the generic Bismuth, Metronidazole, Tetracycline- she is Penicillin allergic)...  ~  [facility] 7/13-21/10 w/ pleuritic right CP- eval revealed +PTE and f/u 2DEcho showed infer AK, septal dysynergy, atrial septal aneurym, EF= 30-35%... placed on Heparin & bridged to Coumadin Rx (needs very careful monitoring due to duod ulcer & prev hx SAH in 2001 from an AVM)...   ~  Jul10:  post hospital visit & doing satis since discharge... taking Coumadin as directed and she is finishing her HPylori treatment... feels better- denies CP, SOB, abd pain, n/v, etc... we discussed the need for careful monitoring of protimes, Cards f/u in light of recent findings, GI f/u after Rx for HPylori...  ~  Sep10:  she is stable... she had f/u DrStark 8/10- "the bug is gone" w/ eradication of the HPylori... she is taking the Coumadin 5mg  tabs but continues to struggle w/ protocol for protimes at [facility] office... we discussed this in detail again today.  ~  Nov10:  she was hosp by Cards 9/30-10/2/10 for CP- she ruled out for MI & had f/u CT Angio chest- NEG for emboli (prev emboli resolved), & VenDopplers- NEG for DVT... Ziac changed to Coreg due to brady, and Losartan decr to 50 due to BP... she notes occas palpit & states she feels worse on the Coreg... BP is up to 146/90 (we will incr Cozaar back to 100mg /d)... still on the Coumadin 5mg /d- protime= 49.4/ INR= 4.8 & we discussed stopping the coumadin due to recent neg CT Angio & bleeding risk...  labs today show BS= 201/ A1c= 8.4 on Metformin500Bid + Glimep1mg /d (prev refused to incr to 2mg /d)... DrEllison tried to add Actos but too$$ &  Cards objected due to cardiomyopathy...    ~  September 30, 2009:  she had f/u DrHochrein c/o palpit & 21d holter showed PAF- he restarted Coumadin but she refuses CC due to the $$$ & we will follow protimes at [facility] starting today... her blood sugars are "all over the place" - medication compliance remains poor & we discussed all these issues today...   ~  January 17, 2010:  she continues to have a major prob w/ compliance- stopped Prav40 due to groin pain (which did not resolve off the med); last protime= 6+wks ago "the nurse must have made a mistake" & states she's taking 5mg  tabs- 1/2 tab daily... didn't bring meds to office as requested- "no changes"- long hx chronic non-compliance w/ med Rx... NOTE> labs today showed BS=238, A1c=9.4, Creat=1.2, Umicralb=pos, & PT=15.3/ INR=1.5> she claims she is taking the Metform500Bid & Glimep1mg /d; call to pharm showed that she filled #30 Metform 12/24/09 & 10/23/09; and filled the Glimep 1mg  on 11/12/09 & 09/09/09...   Current Problem List:  Hx of PULMONARY EMBOLISM (ICD-415.19) - presented w/ pleuritic right CP 7/10 w/ CT angio showing pulm emboli & Rx w/ hep> coumadin... protimes followed w/ the [facility] office protocol (she has refused CC) but she's had problems following directions...  ~  hosp by Cards 9/30-10/2/10 for CP- she ruled out for MI & had f/u CT Angio chest- NEG for emboli (prev emboli  resolved), & VenDopplers- NEG for DVT...  ~  Protime 07/01/09 49.4/ INR 4.8 (on Coumadin 5mg /d) & she's at hi risk for bleed w/ hx DU & AVM- therefore coumadin stopped.  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  HYPERTENSION (ICD-401.9) - long hx of signif HBP w/ hosp for [facility] in 2001 by DrKritzer (due to an AVM)... previously on Toprol XL & Lotrel- developed an ACE cough... long hx non-compliance w/ med Rx...  she denies HA,  fatigue, visual changes, CP, palipit, dizziness, syncope, dyspnea, edema, etc... now on COREG 12.5 Bid + COZAAR 100mg /d... she prefers garlic and lemon juice!!!   ~  4/10:  BP= 160/90 today, pt supposed to be on Bisoprolol 2.5mg  daily, and add Losartan 100mg /d.  ~  7/10:  BP= 140/80 on Ziac2.5 + Cozaar100... reminded to take meds every day!!!  ~  9/10:  BP= 132/82 on Ziac2.5 + Cozaar 100... continue same!  ~  10/10: Hosp by Cards and meds changed- COREG 3.125Bid + COZAAR100- 1/2 tab/d...   ~  11/10:  BP today= 146/90 & states she doesn't feel well- rec incr Cozaar back to 100mg /d.  ~  2/11:  BP= 154/90 & supposed to be on Coreg 6.25Bid & Losartan 100mg /d- Coreg incr to 12.5Bid.  CORONARY ARTERY DISEASE (ICD-414.00) & CARDIOMYOPATHY (ICD-425.4) - eval 2/10 by DrHochrein for Cards: he has perscribed BBlocker and ACE- but developed ACE cough therefore switched to ARB; but pt has long hx of non-compliance w/ med Rx.  ~  abn EKG w/ LBBB...  ~  2DEcho 3/10 showed diffuse LV HK w/ EF ~25%, mild calcif AoV, mild MR, mod dil LA & atrial septal aneurysm.  ~  NuclearStressTest 2/10 showed cardiomyop w/ diffuse HK & EF ~35%.  ~  Cath 11/15/08 w/ EF= 30-35%, & non-obstructive CAD w/ 30-40% midLAD, 40-70% Diag branch of LAD, calcif in proxCIRC, & 30% midRCA.  ~  Repeat 2DECho 7/10 showed infer AK, septal dysynergy, atrial septal aneurym, EF= 30-35%...  ~  Hosp 10/10 by Cards & r/o for MI, meds adjusted as noted.  PAROXYSMAL ATRIAL FIBRILLATION (ICD-427.31) - eval for palpit by DrHochrein w/ PAF on Holter>> he ordered Coumadin restarted 2/11...  HYPERCHOLESTEROLEMIA (ICD-272.0) - previously on Vytorin but she stopped this in 2009... on diet alone now... she really doesn't want statin Rx and we discussed treatment w/ red yeast rice, oatmeal, herbal remedies for now...  ~  FLP 11/07 showed TChol 285, TG 155, HDL 80, LDL 165... obviously not taking med.  ~  FLP 10/09 showed TChol 294, TG 205, HDL 78, LDL 157... she  does not want to start statin Rx.  ~  Discussed starting Rx in light of her cardiomyopathy & CAD on cath- she refuses statin Rx.  ~  FLP 4/10 on diet showed TChol 211, TG 48, HDL 94, LDL 98... rec> keep up the great work!  ~  FLP 2/11 on diet showed TChol 278, TG 303, HDL 74, LDL 154... rec> try PRAVACHOL40 (pt stopped due to groin pain).  DIABETES MELLITUS (ICD-250.00) - previously on Glucovance & Actos... she stopped all her meds early in 2009... we discussed the need for diabetic meds & our options regarding meds and their cost etc... she doesn't want "the needle"...   ~  lab 11/07 showed BS= 117, HgA1c= 8.0.Marland KitchenMarland Kitchen  ~  labs 10/09 showed BS= 319, HgA1c= 11.8.Marland KitchenMarland Kitchen re-started Metformin 500mg - 2Bid + Glimepiride 4mg /d, but c/o abd pain on the Metformin and stopped the Glimepiride on her own...  ~  DrEllison opted for Rx w/ ACTOS 45mg /d, & GLIMEPIRIDE 1mg - 1/2 tabAM (intol Metformin).  ~  labs 4/10 showed BS= 102, A1c= 6.9.Marland KitchenMarland Kitchen continue same.  ~  7/10: regulated in hosp w/ GLUCOPHAGE 500- 1/2Bid + GLIMEPIRIDE 1mg /d... labs showed BS= 144, A1c= 7.4.  ~  9/10: labs showed BS= 270, A1c= 7.3.Marland KitchenMarland Kitchen prev intol to Metform500, therefore incr GLIMEPIRIDE 2mg Qam (she refused).  ~  11/10: Bs= 201, A1c= 8.4.Marland KitchenMarland Kitchen rec try to incr Metform500Bid & Glimep2mg /d...  ~  labs 2/11 showed BS= 188, A1c= 9.0 (hasn't filled Metform in months & ?taking Glimep1mg ?).  ~  labs 6/11 showed BS= 238, A1c=9.4 (pharm confirms not taking meds refularly)... rec> take meds daily! NOTE: Creat=1.2, Umicroalb=pos.  GERD (ICD-530.81) - previously on herbal remedies w/ prev eval from [facility] & DrNat-Mann in 1998...   ~  [facility] 6/10 w/ hemetemesis & UGI bleed from DU +HPylori... eval by [facility] & Rx w/ OMPEPRAZOLE 40mg  & PYLERA (allergic PCN)...  ~  GI f/u DrStark 9/10- HPylori eradicated, continue Omep40mg /d...  DIVERTICULOSIS OF COLON (ICD-562.10) -   ~  last procedure= FlexSig 1998 by DrStark showing divertics only...  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  NEPHROLITHIASIS (ICD-592.0)  DEGENERATIVE JOINT DISEASE (ICD-715.90) - uses VICODIN Prn...  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 sudden HA- eval revealed AVM & conservative approach recommended by Neurosurgery w/ BP control...  NEUROPATHY (ICD-355.9)  ANXIETY (ICD-300.00)   Allergies: 1)  ! Penicillin 2)  ! Lisinopril (Lisinopril)  Comments:  Nurse/Medical Assistant: The patient's medications and allergies were reviewed with the patient and were updated in the Medication and Allergy Lists.  Past History:  Past Medical History: Hx of PULMONARY EMBOLISM (ICD-415.19) HYPERTENSION (ICD-401.9) CORONARY ARTERY DISEASE (ICD-414.00) CARDIOMYOPATHY (ICD-425.4) PALPITATIONS (ICD-785.1) PAROXYSMAL ATRIAL FIBRILLATION (ICD-427.31) HYPERCHOLESTEROLEMIA (ICD-272.0) DIABETES MELLITUS (ICD-250.00) GOITER, MULTINODULAR (ICD-241.1) PEPTIC ULCER DISEASE, HELICOBACTER PYLORI POSITIVE (ICD-533.90) GERD (ICD-530.81) DIVERTICULOSIS OF COLON (ICD-562.10) Hx of BENIGN NEOPLASM OF ADRENAL GLAND (ICD-227.0) NEPHROLITHIASIS (ICD-592.0) DEGENERATIVE JOINT DISEASE (ICD-715.90)................................Marland KitchenCaffrey Hx of ARTERIOVENOUS MALFORMATION (ICD-747.60) Hx of TRANSIENT ISCHEMIC ATTACK (ICD-435.9) NEUROPATHY (ICD-355.9) ANXIETY (ICD-300.00)  Past Surgical History: Left Knee surgery Right Shoulder surgery Lesion ablation of ulcer  Family History: Reviewed history from 05/08/2009 and no changes required. Diabetes in grandmother, but no dm in immediate family Heart disease in aunt lung and brainCancer in mother  Father died from cariovascular hemmorage, had glaucoma ?cancer-uncle No FH of Colon  Cancer: mother had a goiter  Social History: Reviewed history from 04/12/2009 and no changes required. The patient is a widow.  She has one daughter who lives   about 8 miles away.  She is retired.  She quit smoking in 1985.  She   drinks wine occasionally.  quit 5-6 months ago   no caffeine Illicit Drug Use - no  Review of Systems      See HPI       The patient complains of dyspnea on exertion.  The patient denies anorexia, fever, weight loss, weight gain, vision loss, decreased hearing, hoarseness, chest pain, syncope, peripheral edema, prolonged cough, headaches, hemoptysis, abdominal pain, melena,  hematochezia, severe indigestion/heartburn, hematuria, incontinence, muscle weakness, suspicious skin lesions, transient blindness, difficulty walking, depression, unusual weight change, abnormal bleeding, enlarged lymph nodes, and angioedema.    Vital Signs:  Patient profile:   77 year old female Height:      62 inches Weight:      135 pounds BMI:     24.78 O2 Sat:      99 % on Room air Temp:     97.5 degrees F oral Pulse rate:   62 / minute BP sitting:   130 / 80  (right arm) Cuff size:   regular  Vitals Entered By: Randell Loop CMA (January 17, 2010 11:41 AM)  O2 Sat at Rest %:  99 O2 Flow:  Room air CC: 4 month ROV & review of mult medical problems Is Patient Diabetic? Yes Pain Assessment Patient in pain? yes      Onset of pain  right hip down into her leg x 2 days Comments meds updated today   Physical Exam  Additional Exam:  WD, WN, 77 y/o BF in NAD... GENERAL:  Alert & oriented; pleasant & cooperative... HEENT:  [place]/AT, EOM-wnl, PERRLA, EACs-clear, TMs-wnl, NOSE-clear, THROAT-clear & wnl. NECK:  Supple w/ fairROM; no JVD; normal carotid impulses w/o bruits; no thyromegaly but sm nodule palpated; no lymphadenopathy. CHEST:  Clear to P & A; without wheezes/ rales/ or rhonchi. HEART:  Regular Rhythm; gr 1/6 SEM, no rubs or gallops heard... ABDOMEN:  Soft & nontender;  normal bowel sounds; no organomegaly or masses detected. EXT: without deformities, mild arthritic changes; no varicose veins/ venous insuffic/ or edema. NEURO:  CN's intact; motor testing normal; sensory testing normal; gait normal & balance OK. DERM:  No lesions noted; no rash etc...    MISC. Report  Procedure date:  01/17/2010  Findings:      BMP (METABOL)   Sodium                    140 mEq/L                   135-145   Potassium                 4.7 mEq/L                   3.5-5.1   Chloride                  102 mEq/L                   96-112   Carbon Dioxide            30 mEq/L                    19-32   Glucose              [H]  238 mg/dL                   40-98   BUN                  [H]  25 mg/dL                    1-19   Creatinine                1.2 mg/dL  0.4-1.2   Calcium                   10.0 mg/dL                  1.6-10.9   GFR                       56.11 mL/min                >60  Hepatic/Liver Function Panel (HEPATIC)   Total Bilirubin           0.9 mg/dL                   6.0-4.5   Direct Bilirubin          0.1 mg/dL                   4.0-9.8   Alkaline Phosphatase      64 U/L                      39-117   AST                       22 U/L                      0-37   ALT                       18 U/L                      0-35   Total Protein             7.9 g/dL                    1.1-9.1   Albumin                   4.1 g/dL                    4.7-8.2  CBC Platelet w/Diff (CBCD)   White Cell Count          6.7 K/uL                    4.5-10.5   Red Cell Count            4.77 Mil/uL                 3.87-5.11   Hemoglobin                13.8 g/dL                   95.6-21.3   Hematocrit                39.9 %                      36.0-46.0   MCV                       83.7 fl                     78.0-100.0   Platelet Count            168.0 K/uL  150.0-400.0   Neutrophil %              55.7 %                      43.0-77.0    Lymphocyte %              34.7 %                      12.0-46.0   Monocyte %                7.1 %                       3.0-12.0   Eosinophils%              2.0 %                       0.0-5.0   Basophils %               0.5 %                       0.0-3.0  Comments:      TSH (TSH)   FastTSH                   1.71 uIU/mL                 0.35-5.50  B-Type Natiuretic Peptide (BNPR)  B-Type Natriuetic Peptide                        [H]  154.4 pg/mL                 0.0-100.0  Hemoglobin A1C (A1C)   Hemoglobin A1C       [H]  9.4 %                       4.6-6.5  Microalbumin/Creatinine Ratio (MALB)   Microalbumin         [H]  75.7 mg/dL                  4.0-9.8   Urine Creainine           71.3 mg/dL   Microalbumin Ratio   [H]  106.1 mg/g                  0.0-30.0   PT (PTP)   Prothrombin Time     [H]  15.3 sec                    9.1-11.7   INR                  [H]  1.5 ratio                   0.8-1.0   Impression & Recommendations:  Problem # 1:  PAROXYSMAL ATRIAL FIBRILLATION (ICD-427.31) She has PAF on holter from DrHochrein... Hx CAD/ Cardiomyopathy/ etc... ?compliance on her coumadin> supposedly on 5mg - 1/2 daily & protime= 15.3/ INR= 1.5.Marland KitchenMarland Kitchen rec sl increase w/ 1 tab x2d=M Th... Her updated medication list for this problem includes:    Coumadin 5 Mg Tabs (Warfarin sodium) .Marland Kitchen... As directed per coumadin clinic    Carvedilol 12.5 Mg Tabs (Carvedilol) ..... One twice a day  Problem #  2:  HYPERTENSION (ICD-401.9) Controlled>  same meds... Her updated medication list for this problem includes:    Carvedilol 12.5 Mg Tabs (Carvedilol) ..... One twice a day    Cozaar 100 Mg Tabs (Losartan potassium) .Marland Kitchen... Take 1 tab by mouth once daily...  Orders: TLB-BMP (Basic Metabolic Panel-BMET) (80048-METABOL) TLB-Hepatic/Liver Function Pnl (80076-HEPATIC) TLB-CBC Platelet - w/Differential (85025-CBCD) TLB-TSH (Thyroid Stimulating Hormone) (84443-TSH) TLB-BNP (B-Natriuretic Peptide)  (83880-BNPR) TLB-A1C / Hgb A1C (Glycohemoglobin) (83036-A1C) TLB-Microalbumin/Creat Ratio, Urine (82043-MALB) TLB-PT (Protime) (85610-PTP)  Problem # 3:  DIABETES MELLITUS (ICD-250.00) Control is poor due to poor compliance w/ med Rx... pt is requested to take Metformin 500mg  Bid & Glimepiride 1mg /d regularly & call us if she is having any problems... Her updated medication list for this problem includes:    Cozaar 100 Mg Tabs (Losartan potassium) .Marland Kitchen... Take 1 tab by mouth once daily...    Glucophage 500 Mg Tabs (Metformin hcl) .Marland Kitchen... Take 1  tab by mouth two times a day w/ breakfast & dinner...    Glimepiride 1 Mg Tabs (Glimepiride) .Marland Kitchen... Take 1 tab by mouth once daily (in am)...  Problem # 4:  HYPERCHOLESTEROLEMIA (ICD-272.0) She stopped the Prav40 due to groin pain but she notes the groin pain didn't resolve off the med!!! She is not inclined to take med for her cholesterol & prefers to treat w/ diet alone... The following medications were removed from the medication list:    Pravachol 40 Mg Tabs (Pravastatin sodium) .Marland Kitchen... Take one tablet by mouth at bedtime  Problem # 5:  GROIN PAIN>>> ?Etiology, poss pulled muscle/ musculoskeletal pain & rec to try SOMA 350mg  Tid as needed...  Problem # 6:  OTHER MEDICAL PROBLEMS AS NOTED>>>  Complete Medication List: 1)  Coumadin 5 Mg Tabs (Warfarin sodium) .... As directed per coumadin clinic 2)  Carvedilol 12.5 Mg Tabs (Carvedilol) .... One twice a day 3)  Cozaar 100 Mg Tabs (Losartan potassium) .... Take 1 tab by mouth once daily.Marland KitchenMarland Kitchen 4)  Glucophage 500 Mg Tabs (Metformin hcl) .... Take 1  tab by mouth two times a day w/ breakfast & dinner... 5)  Glimepiride 1 Mg Tabs (Glimepiride) .... Take 1 tab by mouth once daily (in am)... 6)  Omeprazole 20 Mg Tbec (Omeprazole) .... Take 1 tablet by mouth once a day 30 minutes before breakfast. 7)  Vicodin 5-500 Mg Tabs (Hydrocodone-acetaminophen) .... Take one tablet by mouth every 6 hours as needed for  pain 8)  Multivitamins Tabs (Multiple vitamin) .... Take 1 tablet by mouth once a day 9)  Soma 350 Mg Tabs (Carisoprodol) .... Take 1 tab by mouth three times a day as needed for muscle spasm...  Patient Instructions: 1)  Today we updated your med list- see below.... 2)  Continue to take all your current medications regularly... 3)  Today we did your follow up blood work... we will call you to let you know what to do w/ the coumadin & your other medications.Marland KitchenMarland Kitchen 4)  Call for any questions.Marland KitchenMarland Kitchen 5)  Please schedule a follow-up appointment in 3 months. Prescriptions: SOMA 350 MG TABS (CARISOPRODOL) take 1 tab by mouth three times a day as needed for muscle spasm...  #90 x 5   Entered and Authorized by:   Michele Mcalpine MD   Signed by:   Michele Mcalpine MD on 01/18/2010   Method used:   Print then Give to Patient   RxID:   [id number] GLIMEPIRIDE 1 MG TABS (GLIMEPIRIDE) take 1 tab by mouth once daily (  in AM)...  #30 x 11   Entered and Authorized by:   Michele Mcalpine MD   Signed by:   Michele Mcalpine MD on 01/18/2010   Method used:   Print then Give to Patient   RxID:   [id number] GLUCOPHAGE 500 MG TABS (METFORMIN HCL) take 1  tab by mouth two times a day w/ breakfast & dinner...  #60 x 11   Entered and Authorized by:   Michele Mcalpine MD   Signed by:   Michele Mcalpine MD on 01/18/2010   Method used:   Print then Give to Patient   RxID:   [id number]

## 2010-09-18 NOTE — Progress Notes (Signed)
Summary: coumadin  Phone Note Call from Patient   Caller: Patient Call For: Mel Tadros Reason for Call: Privacy/Consent Authorization Summary of Call: Pt calling to see what she needs to do about her coumadin. She received a message from [facility] late friday and was told to cont coumadin at 5 mg over weekend and call on Monday to f/u with SN. Pt states she is only taking 2.5 mg daily, not 5mg . I have printed INR report for review. Please advise. Carron Curie CMA  October 21, 2009 9:20 AM  Initial call taken by: Carron Curie CMA,  October 21, 2009 9:20 AM  Follow-up for Phone Call        per SN---on coumadin 5mg    1/2 tab daily----we will increase to 1 tab of coumadin 5mg   on wed and then 1/2 tab x 6 days----will come back the week of 3-21 for recheck of protime-----pt voiced her understanding of the results and how to take the coumadin---will call the next day after her repeat labs Randell Loop [facility]  October 21, 2009 4:03 PM

## 2010-09-18 NOTE — Progress Notes (Signed)
Summary: coumadin appt   Phone Note Call from Patient Call back at Home Phone [phone]   Caller: Patient Reason for Call: Talk to Nurse Summary of Call: pt sch for coumadin appt today, does not have the money to keep paying the copays, request to have done @ [facility] Initial call taken by: Migdalia Dk,  September 23, 2009 8:46 AM  Follow-up for Phone Call        Please advise if Dr Kriste Basque will agree to follow and dose pt's PT/INR's d/t financial reasons pt unable to afford coumadin clinic copays.   Follow-up by: Cloyde Reams RN,  September 23, 2009 11:44 AM  Additional Follow-up for Phone Call Additional follow up Details #1::        Please advise.Michel Bickers [facility]  September 23, 2009 12:21 PM   per SN---yes if she will cooperate with our coumadin rules----she must remember to come to get protimes done and call us the next day for her results. Randell Loop CMA  September 23, 2009 2:31 PM   LMTCB. Carron Curie CMA  September 23, 2009 2:35 PM  pt advised ok to have levels checked here if she follows our coumadin rules. Advised pt she needs to cont to get her pt checked regularly and call the next day for results. Pt staes she will do so. She states her next level needs to be drawn on Monday so I have placed an order for IDX for monday. Carron Curie CMA  September 24, 2009 9:09 AM

## 2010-09-18 NOTE — Progress Notes (Signed)
Summary: fasting-LMTCBx1  Phone Note Call from Patient Call back at Home Phone [phone]   Caller: Patient Call For: nadel Reason for Call: Talk to Nurse Summary of Call: Patient has been fasting for the past two days for church, blood sugar 76 yesterday evening.  Patient stating she is suppose to fast the entire month of January.  Patient wanting to speak to Dr. Jodelle Green nurse about this. Initial call taken by: Lehman Prom,  August 28, 2010 12:58 PM  Follow-up for Phone Call        spoke with the pt and she states she is fasting for her church since 08-26-10. She states the fast is no meats, no sweets or sodas. She states she last checked her BS on Tuesday and it was 76. Pt wanted to know should she do the fast? Please advise. Carron Curie CMA  August 28, 2010 1:59 PM   Additional Follow-up for Phone Call Additional follow up Details #1::        per SN-----ok to avoid meats, sweets and sodas---cont the metformin and other meds and watch sugars and follow up with SN . Randell Loop [facility]  August 28, 2010 4:25 PM     Additional Follow-up for Phone Call Additional follow up Details #2::    ATC x 2- line was busy, WCB Vernie Murders  August 28, 2010 4:36 PM  LMTCBx1. Carron Curie CMA  August 28, 2010 5:18 PM   Spoke with pt and notified of recs per SN.  Pt verbalized understanding and states will keep planned followup for next wk. Follow-up by: Vernie Murders,  August 29, 2010 9:57 AM

## 2010-09-18 NOTE — Assessment & Plan Note (Signed)
Summary: rov 3 months ///kp   Primary Care Provider:  Lorin Picket Nadel,MD  CC:  3 month ROV & review of mult medical problems....  History of Present Illness: 77 y/o BF here for a follow up visit... he has multiple medical problems as noted below...     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 treated w/ PPI & PYLERA (broken down to the generic Bismuth, Metronidazole, Tetracycline- she is Penicillin allergic)...  ~  [facility] 7/13-21/10 w/ pleuritic right CP- eval revealed +PTE and f/u 2DEcho showed infer AK, septal dysynergy, atrial septal aneurym, EF= 30-35%... placed on Heparin & bridged to Coumadin Rx (needs very careful monitoring due to duod ulcer & prev hx SAH in 2001 from an AVM)...  ~  [facility] 9/30-10/2/10 by Cards for CP- she ruled out for MI & had f/u CT Angio chest- NEG for emboli (prev emboli resolved), & VenDopplers- NEG for DVT... Ziac changed to Coreg due to brady, and Losartan decr to 50 due to BP... she notes occas palpit & states she feels worse on the Coreg... BP is up to 146/90 (we will incr Cozaar back to 100mg /d)... still on the Coumadin 5mg /d- protime= 49.4/ INR= 4.8 & we discussed stopping the coumadin due to recent neg CT Angio & bleeding risk...  labs today show BS= 201/ A1c= 8.4 on Metformin500Bid + Glimep1mg /d (prev refused to incr to 2mg /d)... DrEllison tried to add Actos but too$$ & Cards objected due to cardiomyopathy...    ~  September 30, 2009:  she had f/u DrHochrein c/o palpit & 21d holter showed PAF- he restarted Coumadin but she refuses CC due to the $$$ & we will follow protimes at [facility] starting today... her blood sugars are "all over the place" - medication compliance remains poor & we discussed all these issues today...   ~  January 17, 2010:  she continues to have a major prob w/ compliance- stopped Prav40 due to groin pain (which did not resolve off the med); last protime= 6+wks ago "the nurse must have made a mistake" & states she's taking  5mg  tabs- 1/2 tab daily... didn't bring meds to office as requested- "no changes"- long hx chronic non-compliance w/ med Rx... NOTE> labs today showed BS=238, A1c=9.4, Creat=1.2, Umicralb=pos (she is on ARB), & PT=15.3/ INR=1.5> she claims she is taking the Metform500Bid & Glimep1mg /d; call to pharm showed that she filled #30 Metform 12/24/09 & 10/23/09; and filled the Glimep 1mg  on 11/12/09 & 09/09/09...   ~  May 05, 2010:  once again she did not bring meds to visit or list- "same thing, you have it right there" & again asked to bring all meds to every visit... BP controlled when she takes meds regularly;  admits to only taking the Prav40 "now & again";  not checking BS at home &she wants to know "why can't I just incr my intake of cinnamon, garlic, and beets?"... she saw DrEllison 6/11- note reviewed "therapy limited by noncompliance".   Current Problem List:  Hx of PULMONARY EMBOLISM (ICD-415.19) - presented w/ pleuritic right CP 7/10 w/ CT angio showing pulm emboli & Rx w/ hep> coumadin... protimes followed w/ the [facility] office protocol (she has refused CC) but she's had problems following directions...  ~  hosp by Cards 9/30-10/2/10 for CP- she ruled out for MI & had f/u CT Angio chest- NEG for emboli (prev emboli resolved), & VenDopplers- NEG for DVT...  ~  Protime 07/01/09 49.4/ INR 4.8 (on  Coumadin 5mg /d) & she's at hi risk for bleed w/ hx DU & AVM- therefore coumadin stopped.  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  HYPERTENSION (ICD-401.9) - long hx of signif HBP w/ hosp for [facility] in 2001 by DrKritzer (due to an AVM)... previously on Toprol XL & Lotrel- developed an ACE cough... long hx non-compliance w/ med Rx...  she denies HA, fatigue, visual changes, CP, palipit, dizziness, syncope, dyspnea, edema, etc... now on COREG 12.5 Bid + COZAAR  100mg /d... she prefers garlic and lemon juice!!!   ~  4/10:  BP= 160/90 today, pt supposed to be on Bisoprolol 2.5mg  daily, and add Losartan 100mg /d.  ~  7/10:  BP= 140/80 on Ziac2.5 + Cozaar100... reminded to take meds every day!!!  ~  9/10:  BP= 132/82 on Ziac2.5 + Cozaar 100... continue same!  ~  10/10: Hosp by Cards and meds changed- COREG 3.125Bid + COZAAR100- 1/2 tab/d...   ~  11/10:  BP today= 146/90 & states she doesn't feel well- rec incr Cozaar back to 100mg /d.  ~  2/11:  BP= 154/90 & supposed to be on Coreg 6.25Bid & Losartan 100mg /d- Coreg incr to 12.5Bid.  ~  9/11:  BP= 150/90 & pt reminded to take meds every day!  CORONARY ARTERY DISEASE (ICD-414.00) & CARDIOMYOPATHY (ICD-425.4) - eval 2/10 by DrHochrein for Cards: he has perscribed BBlocker and ACE- but developed ACE cough therefore switched to ARB; but pt has long hx of non-compliance w/ med Rx.  ~  abn EKG w/ LBBB...  ~  2DEcho 3/10 showed diffuse LV HK w/ EF ~25%, mild calcif AoV, mild MR, mod dil LA & atrial septal aneurysm.  ~  NuclearStressTest 2/10 showed cardiomyop w/ diffuse HK & EF ~35%.  ~  Cath 11/15/08 w/ EF= 30-35%, & non-obstructive CAD w/ 30-40% midLAD, 40-70% Diag branch of LAD, calcif in proxCIRC, & 30% midRCA.  ~  Repeat 2DECho 7/10 showed infer AK, septal dysynergy, atrial septal aneurym, EF= 30-35%...  ~  Hosp 10/10 by Cards & r/o for MI, meds adjusted as noted.  PAROXYSMAL ATRIAL FIBRILLATION (ICD-427.31) - eval for palpit by DrHochrein w/ PAF on Holter>> he ordered Coumadin restarted 2/11... she refused Coumadin Clinic, therefore Protimes done in [place] office.  HYPERCHOLESTEROLEMIA (ICD-272.0) - previously on Vytorin but she stopped this in 2009... on diet alone now... she really doesn't want statin Rx and we discussed treatment w/ red yeast rice, oatmeal, herbal remedies for now...  ~  FLP 11/07 showed TChol 285, TG 155, HDL 80, LDL 165... obviously not taking med.  ~  FLP 10/09 showed TChol 294, TG 205, HDL  78, LDL 157... she does not want to start statin Rx.  ~  Discussed starting Rx in light of her cardiomyopathy & CAD on cath- she refuses statin Rx.  ~  FLP 4/10 on diet showed TChol 211, TG 48, HDL 94, LDL 98... rec> keep up the great work!  ~  FLP 2/11 on diet showed TChol 278, TG 303, HDL 74, LDL 154... rec> try PRAVACHOL40 (pt stopped due to groin pain).  ~  9/11: pt admits to only taking the Prav40 "now & again"- asked to take it every day.  DIABETES MELLITUS (ICD-250.00) - previously on Glucovance & Actos... she stopped all her meds early in 2009... we discussed  the need for diabetic meds & our options regarding meds and their cost etc... she doesn't want "the needle"...   ~  lab 11/07 showed BS= 117, HgA1c= 8.0.Marland KitchenMarland Kitchen  ~  labs 10/09 showed BS= 319, HgA1c= 11.8.Marland KitchenMarland Kitchen re-started Metformin 500mg - 2Bid + Glimepiride 4mg /d, but c/o abd pain on the Metformin and stopped the Glimepiride on her own...  ~  DrEllison opted for Rx w/ ACTOS 45mg /d, & GLIMEPIRIDE 1mg - 1/2 tabAM (intol Metformin).  ~  labs 4/10 showed BS= 102, A1c= 6.9.Marland KitchenMarland Kitchen continue same.  ~  7/10: regulated in hosp w/ GLUCOPHAGE 500- 1/2Bid + GLIMEPIRIDE 1mg /d... labs showed BS= 144, A1c= 7.4.  ~  9/10: labs showed BS= 270, A1c= 7.3.Marland KitchenMarland Kitchen prev intol to Metform500, therefore incr GLIMEPIRIDE 2mg Qam (she refused).  ~  11/10: Bs= 201, A1c= 8.4.Marland KitchenMarland Kitchen rec try to incr Metform500Bid & Glimep2mg /d...  ~  labs 2/11 showed BS= 188, A1c= 9.0 (hasn't filled Metform in months & ?taking Glimep1mg ?).  ~  labs 6/11 showed BS= 238, A1c=9.4 (pharm confirms not taking meds regularly)... rec> take meds daily! NOTE: Creat=1.2, Umicroalb=pos.  GERD (ICD-530.81) - previously on herbal remedies w/ prev eval [facility] & DrNat-Mann in 1998...   ~  [facility] 6/10 w/ hemetemesis & UGI bleed from DU +HPylori... eval by [facility] & Rx w/ OMPEPRAZOLE 40mg  & PYLERA (allergic PCN)...  ~  GI f/u DrStark 9/10- HPylori eradicated, continue Omep40mg /d...  DIVERTICULOSIS OF COLON  (ICD-562.10) -   ~  last procedure= FlexSig 1998 by DrStark showing divertics only...  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  NEPHROLITHIASIS (ICD-592.0)  DEGENERATIVE JOINT DISEASE (ICD-715.90) - uses VICODIN Prn...  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 sudden HA- eval revealed AVM & conservative approach recommended by Neurosurgery w/ BP control...  NEUROPATHY (ICD-355.9)  ANXIETY (ICD-300.00)   Preventive Screening-Counseling & Management  Alcohol-Tobacco     Smoking Status: quit     Year Quit: 1985  Comments: pt stated that she only smoked 3 cigs per day  Allergies: 1)  ! Penicillin 2)  ! Lisinopril (Lisinopril)  Comments:  Nurse/Medical Assistant: The patient's medications and allergies were reviewed with the patient and were updated in the Medication and Allergy Lists.  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 Diabetes in grandmother, but no dm in immediate family Heart disease in aunt lung and brainCancer in mother  Father died from cariovascular hemmorage, had glaucoma ?cancer-uncle No FH of Colon Cancer: mother had a goiter  Social History: Reviewed history from 04/12/2009 and no changes required. The patient is a widow.  She has one daughter who lives   about 8 miles away.  She is retired.  She quit smoking in 1985.  She   drinks wine  occasionally.  quit 5-6 months ago   no caffeine Illicit Drug Use - no  Review of Systems      See HPI  The patient denies anorexia, fever, weight loss, weight gain, vision loss, decreased hearing, hoarseness, chest pain, syncope, dyspnea on exertion, peripheral edema, prolonged cough, headaches, hemoptysis, abdominal pain, melena, hematochezia, severe indigestion/heartburn, hematuria, incontinence, muscle weakness, suspicious skin lesions, transient blindness, difficulty walking, depression, unusual weight change, abnormal bleeding, enlarged lymph nodes, and angioedema.    Vital Signs:  Patient profile:   77 year old female Height:      62 inches Weight:      138.38 pounds BMI:     25.40 O2 Sat:      100 % on Room air Temp:     97.3 degrees F oral Pulse rate:   65 / minute BP sitting:   150 / 90  (left arm) Cuff size:   regular  Vitals Entered By: Randell Loop CMA (May 05, 2010 11:39 AM)  O2 Sat at Rest %:  100 O2 Flow:  Room air CC: 3 month ROV & review of mult medical problems... Is Patient Diabetic? Yes Pain Assessment Patient in pain? no      Comments meds updated today with pt   Physical Exam  Additional Exam:  WD, WN, 77 y/o BF in NAD... GENERAL:  Alert & oriented; pleasant & cooperative... HEENT:  [place]/AT, EOM-wnl, PERRLA, EACs-clear, TMs-wnl, NOSE-clear, THROAT-clear & wnl. NECK:  Supple w/ fairROM; no JVD; normal carotid impulses w/o  bruits; no thyromegaly but sm nodule palpated; no lymphadenopathy. CHEST:  Clear to P & A; without wheezes/ rales/ or rhonchi. HEART:  Regular Rhythm; gr 1/6 SEM, no rubs or gallops heard... ABDOMEN:  Soft & nontender; normal bowel sounds; no organomegaly or masses detected. EXT: without deformities, mild arthritic changes; no varicose veins/ venous insuffic/ or edema. NEURO:  CN's intact; motor testing normal; sensory testing normal; gait normal & balance OK. DERM:  No lesions noted; no rash etc...    Impression & Recommendations:  Problem # 1:  HYPERTENSION (ICD-401.9) Controlled on BBlocker & ARB when she takes them regularly> encouraged to take meds every day!!! Her updated medication list for this problem includes:    Carvedilol 12.5 Mg Tabs (Carvedilol) ..... One twice a day    Cozaar 100 Mg Tabs (Losartan potassium) .Marland Kitchen... Take 1 tab by mouth once daily...  Problem # 2:  CORONARY ARTERY DISEASE (ICD-414.00) Follwed by DrHochrein> continue BBlocker & ARB, incr exercise... Her updated medication list for this problem includes:    Carvedilol 12.5 Mg Tabs (Carvedilol) ..... One twice a day    Cozaar 100 Mg Tabs (Losartan potassium) .Marland Kitchen... Take 1 tab by mouth once daily...  Problem # 3:  PAROXYSMAL ATRIAL FIBRILLATION (ICD-427.31) Followed by DrHochrein on BBlocker for rate control & Coumadin via [facility] office lab... Her updated medication list for this problem includes:    Coumadin 5 Mg Tabs (Warfarin sodium) .Marland Kitchen... As directed per coumadin clinic    Carvedilol 12.5 Mg Tabs (Carvedilol) ..... One twice a day  Problem # 4:  HYPERCHOLESTEROLEMIA (ICD-272.0) She is asked to take her Statin med regularly... Her updated medication list for this problem includes:    Pravastatin Sodium 40 Mg Tabs (Pravastatin sodium) .Marland Kitchen... 1 at bedtime - hold due to muscle pain  Problem # 5:  DIABETES MELLITUS (ICD-250.00) Continue Medformin & Sulfonylurea regularly, monitor BS at home, discussed diet +  exercise... Her updated medication list for this problem includes:  Cozaar 100 Mg Tabs (Losartan potassium) .Marland Kitchen... Take 1 tab by mouth once daily...    Metformin Hcl 500 Mg Xr24h-tab (Metformin hcl) .Marland KitchenMarland KitchenMarland KitchenMarland Kitchen 4 tabs each am    Glimepiride 4 Mg Tabs (Glimepiride) .Marland Kitchen... 1 tab each am  Problem # 6:  GERD (ICD-530.81) GI is stable>  continue PPI Rx... The following medications were removed from the medication list:    Omeprazole 20 Mg Tbec (Omeprazole) .Marland Kitchen... Take 1 tablet by mouth once a day 30 minutes before breakfast.  Problem # 7:  OTHER MEDICAL PROBLEMS AS NOTED>>>  Complete Medication List: 1)  Coumadin 5 Mg Tabs (Warfarin sodium) .... As directed per coumadin clinic 2)  Carvedilol 12.5 Mg Tabs (Carvedilol) .... One twice a day 3)  Cozaar 100 Mg Tabs (Losartan potassium) .... Take 1 tab by mouth once daily.Marland KitchenMarland Kitchen 4)  Pravastatin Sodium 40 Mg Tabs (Pravastatin sodium) .Marland Kitchen.. 1 at bedtime - hold due to muscle pain 5)  Metformin Hcl 500 Mg Xr24h-tab (Metformin hcl) .... 4 tabs each am 6)  Glimepiride 4 Mg Tabs (Glimepiride) .Marland Kitchen.. 1 tab each am 7)  Vicodin 5-500 Mg Tabs (Hydrocodone-acetaminophen) .... Take one tablet by mouth every 6 hours as needed for pain 8)  Soma 350 Mg Tabs (Carisoprodol) .... Take 1 tab by mouth three times a day as needed for muscle spasm... 9)  Multivitamins Tabs (Multiple vitamin) .... Take 1 tablet by mouth once a day  Patient Instructions: 1)  Today we updated your med list- see below.... 2)  Please try to take the Pravastatin daily, and the Metformin & Glimepiride regularly every day... 3)  OK to try the cinnamon, garlic, & beets - as you had planned... 4)  Increase your exercise & keep your weight down... 5)  Let's plan a follow up visit in 4 months w/ FASTING blood work at that time.Marland KitchenMarland Kitchen

## 2010-09-18 NOTE — Assessment & Plan Note (Signed)
Summary: per check out/sf      Allergies Added:   Visit Type:  Follow-up Primary Provider:  Lorin Picket Nadel,MD  CC:  palpitations.  History of Present Illness: The patient presents for follow up of the above.  Since I last saw her she has had no prolonged palpitations. She occasionally gets some tachyarrhythmias when she is stressed or moving quickly. She continues to have stressed with her boyfriend. She has had no new chest pressure, neck or arm discomfort. She has had no new shortness of breath, PND or orthopnea. Of note I increased her carvedilol at the last appointment. She tolerated this without significant complaints.  Current Medications (verified): 1)  Carvedilol 12.5 Mg Tabs (Carvedilol) .... One Twice A Day 2)  Cozaar 100 Mg Tabs (Losartan Potassium) .... Take 1 Tab By Mouth Once Daily.Marland KitchenMarland Kitchen 3)  Glucophage 500 Mg Tabs (Metformin Hcl) .... Take 1/2  Tab By Mouth Two Times A Day W/ Breakfast & Dinner... 4)  Glimepiride 1 Mg Tabs (Glimepiride) .... Take 1 Tab By Mouth Once Daily (In Am).Marland KitchenMarland Kitchen 5)  Omeprazole 20 Mg Tbec (Omeprazole) .... Take 1 Tablet By Mouth Once A Day 30 Minutes Before Breakfast. 6)  Vicodin 5-500 Mg Tabs (Hydrocodone-Acetaminophen) .... Take One Tablet By Mouth Every 6 Hours As Needed For Pain 7)  Coumadin 5 Mg Tabs (Warfarin Sodium) .... As Directed Per Coumadin Clinic 8)  Pravachol 40 Mg Tabs (Pravastatin Sodium) .... Take One Tablet By Mouth At Bedtime  Allergies (verified): 1)  ! Penicillin 2)  ! Lisinopril (Lisinopril)  Past History:  Past Medical History: Reviewed history from 11/08/2009 and no changes required. 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 Left Knee surgery Right Shoulder surgery Lesion ablation of ulcer  Review of Systems       As stated in the HPI and negative for all other systems.   Vital Signs:  Patient profile:   77 year old female Height:      62 inches Weight:      136 pounds BMI:     24.96 Pulse rate:   62 / minute BP sitting:   152 / 94  (right arm) Cuff size:   regular  Vitals Entered By: Hardin Negus, RMA (November 21, 2009 11:56 AM)  Physical Exam  General:  Well developed, well nourished, in no acute distress. Head:  normocephalic and atraumatic Eyes:  PERRLA/EOM intact; conjunctiva and lids normal. Neck:  Neck supple, no JVD. No masses, thyromegaly or abnormal cervical nodes. Chest Wall:  no deformities or breast masses noted Lungs:  Clear bilaterally to auscultation and percussion. Abdomen:  Bowel sounds positive; abdomen soft and non-tender without masses, organomegaly, or hernias noted. No hepatosplenomegaly. Msk:  Back normal, normal gait. Muscle strength and tone normal. Extremities:  No clubbing or cyanosis. Neurologic:  Alert and oriented x 3. Skin:  Intact without lesions or rashes. Cervical Nodes:  no significant adenopathy Axillary Nodes:  no significant adenopathy Inguinal Nodes:  no significant adenopathy Psych:  Normal affect.   Detailed Cardiovascular Exam  Neck    Carotids:  Carotids full and equal bilaterally without bruits.      Neck Veins: Normal, no JVD.    Heart    Inspection: no deformities or lifts noted.      Palpation:  normal PMI with no thrills palpable.      Auscultation: regular rate and rhythm, S1, S2 without murmurs, rubs, gallops, or clicks.    Vascular    Abdominal Aorta: no palpable masses, pulsations, or audible bruits.      Femoral Pulses: normal femoral pulses bilaterally.      Pedal Pulses: normal pedal pulses bilaterally.      Radial Pulses: normal radial pulses bilaterally.      Peripheral Circulation: no clubbing, cyanosis, or edema noted with normal capillary refill.     EKG  Procedure date:  11/21/2009  Findings:      sinus rhythm, rate 62, leftward axis, interventricular conduction delay, no change from previous  Impression & Recommendations:  Problem # 1:  TACHYCARDIA (ICD-785.0) At this point she is tolerating the meds as listed and not having any significant dysrhythmias. No further change in therapy is indicated.  Problem # 2:  HYPERTENSION (ICD-401.9) Her blood pressure remained slightly elevated. I would like to titrate medicines by adding hydralazine nitrates to her regimen. However, she does not want further med titration and reports that her blood pressures usually well-controlled though she thinks controlled is in the 150s. I will continue to try to address this problem and have further conversations with her at future meetings.  Problem # 3:  CARDIOMYOPATHY (ICD-425.4) I reviewed her previous echocardiogram with an ejection fraction of 35%. She seems to have class I symptoms at this point. For now, since I cannot titrate meds, I will keep her on the regimen as listed. No further imaging is necessary at this point.  Other Orders: EKG w/ Interpretation (93000)  Patient Instructions: 1)  Your physician recommends that you schedule a follow-up appointment in: 6 months with Dr Antoine Poche 2)  Your physician recommends that you continue on your current medications as directed. Please refer to the Current Medication list given to you today. 3)  You have been diagnosed with  Congestive Heart Failure or CHF.  CHF is a condition in which a problem with the structure or function of the heart impairs its ability to supply sufficient blood flow to meet the body's needs.  For further information please visit www.cardiosmart.org for detailed information on CHF. 4)  You have been diagnosed with palpitations. Palpitations are described as a gradual or sudden awareness of the beating of your heart. It may last seconds, minutes, hours, or days and may be caused by the heart beating slower, faster, more strongly, or more irregularly than normal. They are very common and usually not dangerous. Please see the handout/brochure given to you today for more information.

## 2010-09-18 NOTE — Progress Notes (Signed)
Summary: LABS  Phone Note Call from Patient Call back at Home Phone [phone]   Caller: Patient Call For: [name] Summary of Call: WHEN DOES PT NEED TO HAVE LABS AGAIN Initial call taken by: Lacinda Axon,  August 19, 2010 3:04 PM  Follow-up for Phone Call        Pt needs to f/u for labs in 3 weeks which will be friday 08-22-10. [id number] to advise the pt. Carron Curie CMA  August 19, 2010 4:52 PM  pt aware of the above and order is already in IDX.Carron Curie CMA  August 19, 2010 4:58 PM

## 2010-09-18 NOTE — Assessment & Plan Note (Signed)
Summary: Acute NP office visit - left arm pain   Copy to:  Alroy Dust, MD Primary Provider/Referring Provider:  Lalla Brothers  CC:  "cramping" sensation on left bicep and tricep preventing pt from lifting her arm above the waist onset this morning - denies cardiac/stroke symptoms.  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 treated w/ PPI & PYLERA (broken down to the generic Bismuth, Metronidazole, Tetracycline- she is Penicillin allergic)...  ~  [facility] 7/13-21/10 w/ pleuritic right CP- eval revealed +PTE and f/u 2DEcho showed infer AK, septal dysynergy, atrial septal aneurym, EF= 30-35%... placed on Heparin & bridged to Coumadin Rx (needs very careful monitoring due to duod ulcer & prev hx SAH in 2001 from an AVM)...   ~  March 11, 2009:  post hospital visit & doing satis since discharge... taking Coumadin as directed and she is finishing her HPylori treatment... feels better- denies CP, SOB, abd pain, n/v, etc... we discussed the need for careful monitoring of protimes, Cards f/u in light of recent findings, GI f/u after Rx for HPylori... we will check labs today & f/u w/ coumadin clinic...   ~  April 29, 2009:  she is stable... she had f/u DrStart 8/27- "the bug is gone" w/ readication of the HPylori... she is taking the Coumadin 5mg  tabs- 1 tab daily x5d, 1/2 tab x2d= MW..   May 20, 2009 --Returns for post hospital. . Admitted 9/28-10/2/10 for chest pain, ruled out for MI,  CT angiogram negative for pulmonary  emboli. Venous dopplers neg for dvt. . Changed off bisoprolol , to coreg d/t bradycardia.  Here today for PT/INR>  . Since discharge , chest pain has resolved.  Continues with joint pain esp in knees right > left. Denies chest pain,  orthopnea, hemoptysis, fever, n/v/d, edema, headache.   November 08, 2009 Acute visit.  Pt c/o rt knee pain x 1 month.  She  states that since this am the pain is also in her rt calf.  no swelling of leg or ankle.  Pain worse walking . no fever. No back pain or numbness.    February 24, 2010--Presents for an acute office visit. Complains of "cramping" sensation on left bicep and tricep preventing pt from lifting her arm above the waist onset this morning . Complains that this am she lifted her arm to put up hair and felt sharp pain in elbow radiating up to mid upper arm. Very sore to touch along inside of elbow, no swelling or redness. no known injury. Recent travel to [facility] , rode in car. She did prop up arm some on seat rest. No new meds. INR today was 2.3. Denies chest pain, dyspnea, orthopnea, hemoptysis, fever, n/v/d, edema, headache.   Medications Prior to Update: 1)  Coumadin 5 Mg Tabs (Warfarin Sodium) .... As Directed Per Coumadin Clinic 2)  Carvedilol 12.5 Mg Tabs (Carvedilol) .... One Twice A Day 3)  Cozaar 100 Mg Tabs (Losartan Potassium) .... Take 1 Tab By Mouth Once Daily.Marland KitchenMarland Kitchen 4)  Omeprazole 20 Mg Tbec (Omeprazole) .... Take 1 Tablet By Mouth Once A Day 30 Minutes Before Breakfast. 5)  Vicodin 5-500 Mg Tabs (Hydrocodone-Acetaminophen) .... Take One Tablet By Mouth Every 6 Hours As Needed For Pain 6)  Multivitamins  Tabs (Multiple Vitamin) .... Take 1 Tablet By Mouth Once A Day 7)  Soma 350 Mg Tabs (Carisoprodol) .... Take 1 Tab By Mouth Three Times A Day As Needed For Muscle Spasm... 8)  Pravastatin Sodium 40 Mg Tabs (Pravastatin Sodium) .Marland Kitchen.. 1 At Bedtime - Hold Due To Muscle Pain 9)  Metformin Hcl 500 Mg Xr24h-Tab (Metformin Hcl) .... 4 Tabs Each Am 10)  Glimepiride 4 Mg Tabs (Glimepiride) .Marland Kitchen.. 1 Tab Each Am  Current Medications (verified): 1)  Coumadin 5 Mg Tabs (Warfarin Sodium) .... As Directed Per Coumadin Clinic 2)  Carvedilol 12.5 Mg Tabs (Carvedilol) .... One Twice A Day 3)  Cozaar 100 Mg Tabs (Losartan Potassium) .... Take 1 Tab By Mouth Once Daily.Marland KitchenMarland Kitchen 4)  Omeprazole 20 Mg Tbec (Omeprazole) ....  Take 1 Tablet By Mouth Once A Day 30 Minutes Before Breakfast. 5)  Vicodin 5-500 Mg Tabs (Hydrocodone-Acetaminophen) .... Take One Tablet By Mouth Every 6 Hours As Needed For Pain 6)  Multivitamins  Tabs (Multiple Vitamin) .... Take 1 Tablet By Mouth Once A Day 7)  Soma 350 Mg Tabs (Carisoprodol) .... Take 1 Tab By Mouth Three Times A Day As Needed For Muscle Spasm... 8)  Pravastatin Sodium 40 Mg Tabs (Pravastatin Sodium) .Marland Kitchen.. 1 At Bedtime - Hold Due To Muscle Pain 9)  Metformin Hcl 500 Mg Xr24h-Tab (Metformin Hcl) .... 4 Tabs Each Am 10)  Glimepiride 4 Mg Tabs (Glimepiride) .Marland Kitchen.. 1 Tab Each Am  Allergies (verified): 1)  ! Penicillin 2)  ! 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) NEPHROLITHIASIS (ICD-592.0) DEGENERATIVE JOINT DISEASE (ICD-715.90)................................Marland KitchenCaffrey Hx of ARTERIOVENOUS MALFORMATION (ICD-747.60) Hx of TRANSIENT ISCHEMIC ATTACK (ICD-435.9) NEUROPATHY (ICD-355.9) ANXIETY (ICD-300.00)  Past Surgical History: Last updated: 01/17/2010 Left Knee surgery Right Shoulder surgery Lesion ablation of ulcer  Family History: Last updated: 05/08/2009 Diabetes in grandmother, but no dm in immediate family Heart disease in aunt lung and brainCancer in mother  Father died from cariovascular hemmorage, had glaucoma ?cancer-uncle No FH of Colon Cancer: mother had a goiter  Social History: Last updated: 04/12/2009 The patient is a widow.  She has one  daughter who lives   about 8 miles away.  She is retired.  She quit smoking in 1985.  She   drinks wine occasionally.  quit 5-6 months ago   no caffeine Illicit Drug Use - no  Risk Factors: Smoking Status: quit (09/10/2008)  Review of Systems      See HPI  Vital Signs:  Patient profile:   77 year old female Height:      62 inches Weight:      139 pounds BMI:     25.52 O2 Sat:      97 % on Room air Temp:     97.1 degrees F oral Pulse rate:   70 / minute BP sitting:   126 / 76  (right arm) Cuff size:   regular  Vitals Entered By: Boone Master CNA/MA (February 24, 2010 2:25 PM)  O2 Flow:  Room air CC: "cramping" sensation on left bicep and tricep preventing pt from lifting her arm above the waist onset this morning - denies cardiac/stroke symptoms Is Patient Diabetic? Yes Comments Medications reviewed with patient Daytime contact number verified with patient. Boone Master CNA/MA  February 24, 2010  2:24 PM    Physical Exam  Additional Exam:  GEN: A/Ox3; pleasant , NAD wt 137 November 09, 2009 >>139 February 24, 2010  HEENT:  [place]/AT, , EACs-clear, TMs-wnl, NOSE-clear, THROAT-clear NECK:  Supple w/ fair ROM; no JVD; normal carotid impulses w/o bruits; no thyromegaly or nodules palpated; no lymphadenopathy. RESP  Clear to P & A; w/o, wheezes/ rales/ or rhonchi. CARD:  RRR, no m/r/g   GI:   Soft & nt; nml bowel sounds; no organomegaly or masses detected. Musco: Warm bil,  no calf tenderness edema, clubbing, pulses intact along left medial elbow w/ tenderness, no redness or swelling. ROM abn-painful w/ extension-stops d/t to pain. tender along distal humerus, no palpable abnormalities. skin intact ,nml grips. Shoulder/neck rom nml.   Neuro: intact with no focal deficits noted .   Impression & Recommendations:  Problem # 1:  ELBOW PAIN, LEFT (ICD-719.42) ?etilogy w/ possible medial epicondylitis.  REC:  xray pending, follow up accordingly will use steroids sparingly d/t DM  Prednisone  20mg  2 tabs once daily w/ food x 3 days,  call if sugar  is over 250 Elevate, ice as needed  I will call with xray results.  Sling for comfort.  Vicodin for severe pain, may make you sleepy. -you have this is home.  Please contact office for sooner follow up if symptoms do not improve or worsen  Orders: T-Elbow Left 2 View (73070TC) T-Humerus Left 2 Views (73060TC) Est. Patient Level IV (69629)  Complete Medication List: 1)  Coumadin 5 Mg Tabs (Warfarin sodium) .... As directed per coumadin clinic 2)  Carvedilol 12.5 Mg Tabs (Carvedilol) .... One twice a day 3)  Cozaar 100 Mg Tabs (Losartan potassium) .... Take 1 tab by mouth once daily.Marland KitchenMarland Kitchen 4)  Omeprazole 20 Mg Tbec (Omeprazole) .... Take 1 tablet by mouth once a day 30 minutes before breakfast. 5)  Vicodin 5-500 Mg Tabs (Hydrocodone-acetaminophen) .... Take one tablet by mouth every 6 hours as needed for pain 6)  Multivitamins Tabs (Multiple vitamin) .... Take 1 tablet by mouth once a day 7)  Soma 350 Mg Tabs (Carisoprodol) .... Take 1 tab by mouth three times a day as needed for muscle spasm... 8)  Pravastatin Sodium 40 Mg Tabs (Pravastatin sodium) .Marland Kitchen.. 1 at bedtime - hold due to muscle pain 9)  Metformin Hcl 500 Mg Xr24h-tab (Metformin hcl) .... 4 tabs each am 10)  Glimepiride 4 Mg Tabs (Glimepiride) .Marland Kitchen.. 1 tab each am  Patient Instructions: 1)  Prednisone 20mg  2 tabs once daily w/ food x 3 days,  2)  call if sugar  is over 250 3)  Elevate, ice as needed  4)  I will call with xray results.  5)  Sling for comfort.  6)  Vicodin for severe pain, may make you sleepy. -you have this is home.  7)  Please contact office for sooner follow up if symptoms do not improve or worsen

## 2010-09-18 NOTE — Progress Notes (Signed)
Summary: lab results   Phone Note Call from Patient Call back at Home Phone [phone]   Caller: Patient Call For: dr Kriste Basque Summary of Call: Patient phoned stated that she is returning a call regarding her lab work. She can be reached at [phone] Initial call taken by: Vedia Coffer,  September 11, 2010 1:41 PM  Follow-up for Phone Call        called and spoke with pt and per SN---lab results  bs at 122 and a1c 8.5---poor control ---SN agrees with Dr. George Hugh meds----metformin 500mg  two times a day , glimep 2 mg once daily and januvia 100mg    once daily - chol 198 is improved and recs to take her pravastain daily---pt stated that she has plenty of refills of her meds and will call for any needed refills Randell Loop CMA  September 11, 2010 3:01 PM

## 2010-09-18 NOTE — Progress Notes (Signed)
Summary: results  Phone Note Call from Patient   Caller: Patient Call For: [name] Summary of Call: need results of protime Initial call taken by: Rickard Patience,  November 12, 2009 1:29 PM  Follow-up for Phone Call        pt calling for PT INR results from 11-08-2009. Labs unsigned.  Please advise.  thanks.  Aundra Millet Reynolds LPN  November 12, 2009 1:46 PM   Additional Follow-up for Phone Call Additional follow up Details #1::        called and spoke with pt about her last protime results---per SN---keep the same dose at 1/2 tab daily of the coumadin 5mg .  she is aware of this.  she is also having a thyroid biopsy on april 13 at 12:45 and tammy calld from [facility]---they need her off the couamdin for 4 days prior to biopsy and they need [place] to repeat her protime prior to biopsy---pt will come in on 4-12  at 10 am for her protime recheck and i will call results to tammy at [phone]. Additional Follow-up by: Randell Loop CMA,  November 13, 2009 12:39 PM

## 2010-09-18 NOTE — Progress Notes (Signed)
   Phone Note Outgoing Call   Summary of Call: Windell Moulding brought around [place] strips on Ms Lucente, showing atrial fib.   She had been discontinued off coumadin per Dr Kriste Basque on 07/01/2009 and was seen by Dr Antoine Poche on  07/04/09  for palpitation of which the monitor was placed.   These recording were dated  08/25/09  and 09/05/09   .Marland Kitchen   Contaced Pam in [place]    Dr Antoine Poche says restart   on coumadin 5 mg  ..   pt states she has some on hand..  Will have Coumadin Clinic call her to reestablish.Marland Kitchen

## 2010-09-18 NOTE — Medication Information (Signed)
Summary: new coumadin eval afib/sl  Anticoagulant Therapy  Managed by: Bethena Midget, RN, BSN Referring MD: Antoine Poche MD, Fayrene Fearing PCP: Lalla Brothers Supervising MD: Graciela Husbands MD, Viviann Spare Indication 1: Atrial Fibrillation Lab Used: [facility] [place] Site: [address] INR POC 1.5 INR RANGE 2.0-3.0  Dietary changes: no    Health status changes: no    Bleeding/hemorrhagic complications: yes       Details: Scant amt of bleeding she states when she wipes after BM. Denies constipation. Encourage to call PCP  Recent/future hospitalizations: no    Any changes in medication regimen? no    Recent/future dental: no  Any missed doses?: no       Is patient compliant with meds? yes      Comments: Restarted 09/12/09 wth 5mg s everyday. Has Hx of Pulm Emboli she states and also last summer had GIB.   Current Medications (verified): 1)  Carvedilol 6.25 Mg Tabs (Carvedilol) .... Take One Tablet By Mouth Twice A Day 2)  Cozaar 100 Mg Tabs (Losartan Potassium) .... 1/2 By Mouth Daily 3)  Glucophage 500 Mg Tabs (Metformin Hcl) .... Take 1/2 Tab By Mouth Two Times A Day At Breakfast & Dinner 4)  Omeprazole 20 Mg Tbec (Omeprazole) .... Take 1 Tablet By Mouth Once A Day 30 Minutes Before Breakfast. 5)  Vicodin 5-500 Mg Tabs (Hydrocodone-Acetaminophen) .... Take One Tablet By Mouth Every 6 Hours As Needed For Pain 6)  Glimepiride 1 Mg Tabs (Glimepiride) .Marland Kitchen.. 1 Qam  Allergies (verified): 1)  ! Penicillin 2)  ! Lisinopril (Lisinopril)  Anticoagulation Management History:      The patient comes in today for her initial visit for anticoagulation therapy.  Positive risk factors for bleeding include an age of 75 years or older, history of CVA/TIA, and presence of serious comorbidities.  The bleeding index is 'high risk'.  Positive CHADS2 values include History of HTN, Age > 63 years old, History of Diabetes, and Prior Stroke/CVA/TIA.  Her last INR was 4.8 ratio.  Anticoagulation responsible  provider: Graciela Husbands MD, Viviann Spare.  INR POC: 1.5.  Cuvette Lot#: [id number].  Exp: 11/2010.    Anticoagulation Management Assessment/Plan:      The next INR is due 09/23/2009.  Anticoagulation instructions were given to patient.  Results were reviewed/authorized by Bethena Midget, RN, BSN.  She was notified by Bethena Midget, RN, BSN.         Current Anticoagulation Instructions: INR 1.5 Change dose to 1 pill everyday except  1 1/2 pills on Wednesdays. Recheck in one week per Eda Keys, Arlester Marker D

## 2010-09-18 NOTE — Progress Notes (Signed)
Summary: results  Phone Note Call from Patient Call back at Home Phone [phone]   Caller: Patient Call For: nadel Reason for Call: Talk to Nurse, Lab or Test Results Summary of Call: pt would like results of protime/PT Initial call taken by: Eugene Gavia,  October 18, 2009 3:32 PM  Follow-up for Phone Call        lmom inr 1.4 ? taking  5 mg daily - should take 5 mg daily thru the weekend then check back with Dr Kriste Basque on Monday 3/7 or call the doctor on call in meat[place] if needed Follow-up by: Nyoka Cowden MD,  October 18, 2009 5:41 PM     Appended Document: results chart reviewed, only on 2.5 per day so 5 mg daily ok for next 3 days then check with Dr Kriste Basque for further instructions

## 2010-09-18 NOTE — Progress Notes (Signed)
Summary: protime  Phone Note Call from Patient Call back at Home Phone [phone]   Caller: Patient Call For: Etheridge Geil Reason for Call: Talk to Nurse Summary of Call: pt wants results of her protime. Initial call taken by: Eugene Gavia,  October 07, 2009 11:33 AM  Follow-up for Phone Call        Please advise. Labs printed to side B.Michel Bickers [facility]  October 07, 2009 11:58 AM  Additional Follow-up for Phone Call Additional follow up Details #1::        called pt back about labs---per SN---protime--restart coumadin 1/2 tab daily  repeat protime in 10 days---march 3.  pt voiced her understanding of this appt on march 3 and repeated this date to me and knows to call the next day for her results.  her chol is much too elevated on diet alone--SN rec starting on pravachol 40mg  daily--pt stated that she will try this med but she is not understanding why her levels are so high when she uses vinegar and garlic and garlic powder daily to help this.  see additional note Additional Follow-up by: Randell Loop CMA,  October 07, 2009 2:32 PM    Additional Follow-up for Phone Call Additional follow up Details #2::    i explained to pt that sometime that does not work for certain people and that with her numbers being that elevated that it would greatly benefit her to start on the meds rec by SN.  she voiced her understanding of this.  her BS was 188 and a1c was 9.0--per SN last note she is only taking the glimeperide 1mg  daily---when i explained to her that this was not enough medication to help bring her numbers down she stated that she is also taking the glucophage 500mg  1/2 tab two times a day but sometimes she only takes this med once daily.  i made SN aware of what pt stated and per SN---she will need to take these meds regularly and the pt is aware of this.  i called pt back to double check with pt to make sure she did understand and she repeated this to  me. Randell Loop CMA  October 07, 2009  2:38 PM   New/Updated Medications: PRAVACHOL 40 MG TABS (PRAVASTATIN SODIUM) take one tablet by mouth at bedtime Prescriptions: PRAVACHOL 40 MG TABS (PRAVASTATIN SODIUM) take one tablet by mouth at bedtime  #30 x 6   Entered by:   Randell Loop CMA   Authorized by:   Michele Mcalpine MD   Signed by:   Randell Loop CMA on 10/07/2009   Method used:   Electronically to        [facility]  [address] [id number] (retail)       [address]       [place], [place]  [address]       Ph: [phone] or [phone]       Fax: [phone]   RxID:   [id number]   Appended Document: meds updated Medications Added GLUCOPHAGE 500 MG TABS (METFORMIN HCL) take 1/2  tab by mouth two times a day w/ breakfast & dinner... COUMADIN 5 MG TABS (WARFARIN SODIUM) Take 1/2  tablet by mouth once a day          Clinical Lists Changes  Medications: Changed medication from GLUCOPHAGE 500 MG TABS (METFORMIN HCL) take 1 tab by mouth two times a day w/ breakfast & dinner... to GLUCOPHAGE 500 MG TABS (METFORMIN HCL) take 1/2  tab by mouth two  times a day w/ breakfast & dinner... Changed medication from COUMADIN 5 MG TABS (WARFARIN SODIUM) Take 1 tablet by mouth once a day to COUMADIN 5 MG TABS (WARFARIN SODIUM) Take 1/2  tablet by mouth once a day

## 2010-09-18 NOTE — Assessment & Plan Note (Signed)
Summary: 3 MTH FU  STC   Vital Signs:  Patient profile:   77 year old female Height:      62 inches (157.48 cm) Weight:      137.50 pounds (62.50 kg) O2 Sat:      97 % on Room air Temp:     97.1 degrees F (36.17 degrees C) oral Pulse rate:   61 / minute BP sitting:   142 / 82  (left arm) Cuff size:   regular  Vitals Entered By: Josph Macho RMA (November 04, 2009 11:26 AM)  O2 Flow:  Room air CC: 3 month follow up/ CF Is Patient Diabetic? Yes   Referring Provider:  Alroy Dust, MD Primary Provider:  Lalla Brothers  CC:  3 month follow up/ CF.  History of Present Illness: pt states she forgets to take her meds 1-2/week.  she says she has a pill organizer, but she no longer uses it.   no cbg record, but states cbg's vary from 79-200.  she says it is higher in am than later in the day.  she says she used to take actos, but does not recall why she no longer does. she says she does not notice her thyroid enlargement.    Current Medications (verified): 1)  Carvedilol 12.5 Mg Tabs (Carvedilol) .... One Twice A Day 2)  Cozaar 100 Mg Tabs (Losartan Potassium) .... Take 1 Tab By Mouth Once Daily.Marland KitchenMarland Kitchen 3)  Glucophage 500 Mg Tabs (Metformin Hcl) .... Take 1/2  Tab By Mouth Two Times A Day W/ Breakfast & Dinner... 4)  Glimepiride 1 Mg Tabs (Glimepiride) .... Take 1 Tab By Mouth Once Daily (In Am).Marland KitchenMarland Kitchen 5)  Omeprazole 20 Mg Tbec (Omeprazole) .... Take 1 Tablet By Mouth Once A Day 30 Minutes Before Breakfast. 6)  Vicodin 5-500 Mg Tabs (Hydrocodone-Acetaminophen) .... Take One Tablet By Mouth Every 6 Hours As Needed For Pain 7)  Coumadin 5 Mg Tabs (Warfarin Sodium) .... Take 1/2  Tablet By Mouth Once A Day 8)  Pravachol 40 Mg Tabs (Pravastatin Sodium) .... Take One Tablet By Mouth At Bedtime  Allergies (verified): 1)  ! Penicillin 2)  ! 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    Physical Exam  General:  normal appearance.  no distress. Neck:  ? of right thyroid nodule, approx 1.5 cm Pulses:  dorsalis pedis intact bilat.   Extremities:  no deformity.  no ulcer on the feet.  feet are of normal color and temp.  no edema  Neurologic:  sensation is intact to touch on the feet    Impression & Recommendations:  Problem # 1:  DIABETES MELLITUS (ICD-250.00) therapy limited by noncompliance.  i'll do the best i can.  Problem # 2:  GOITER, MULTINODULAR (ICD-241.1) ? any change  Other Orders: Radiology Referral (Radiology) Est. Patient Level III (98119)  Patient Instructions: 1)  let's recheck thyroid ultrasound.  you will be called with a day and time for an appointment. 2)  tests are being ordered for you today.  a few days after the test(s), please call [phone] to hear your test results. 3)  please make every effort to remember your medications. 4)  you should consider taking actos, as this is a good medication if you sometimes forget your medication. 5)  Please schedule a follow-up appointment in 3 months.

## 2010-09-24 NOTE — Assessment & Plan Note (Signed)
Summary: 41month.dm/sp   Vital Signs:  Patient profile:   77 year old female Height:      62 inches Weight:      134 pounds BMI:     24.60 Pulse rate:   58 / minute Resp:     16 per minute BP sitting:   158 / 96  (right arm)  Vitals Entered By: Marrion Coy, CNA (September 18, 2010 10:28 AM)  Visit Type:  Follow-up Primary Provider:  Lorin Picket Nadel,MD  CC:  HTN.  History of Present Illness: The patient presents for follow up all her nonischemic cardiomyopathy and hypertension. At the last visit I managed to convince her to increase her carvedilol which she did reluctantly. She had slight abdominal discomfort when she first started this but this went away. Since that time she has had no further cardiovascular symptoms. In particular she has had no presyncope or syncope. He denies any chest discomfort, neck or arm discomfort. She has no shortness of breath. She continues to want to stop her medications. She reports that her blood pressures are rarely below 140 on in the 150s and sometimes in the 160s systolic. She denies any chest pressure, neck or arm discomfort. He has PND or orthopnea.  Current Medications (verified): 1)  Coumadin 5 Mg Tabs (Warfarin Sodium) .... As Directed Per [facility] 2)  Carvedilol 12.5 Mg Tabs (Carvedilol) .... One and 1/2 Tablets Twice A Day 3)  Cozaar 100 Mg Tabs (Losartan Potassium) .... Take 1 Tab By Mouth Once Daily.Marland KitchenMarland Kitchen 4)  Pravastatin Sodium 40 Mg Tabs (Pravastatin Sodium) .Marland Kitchen.. 1 At Bedtime - Hold Due To Muscle Pain 5)  Metformin Hcl 500 Mg Xr24h-Tab (Metformin Hcl) .... 2 Tabs Each Am 6)  Glimepiride 4 Mg Tabs (Glimepiride) .Marland Kitchen.. 1 Tab Each Am 7)  Vicodin 5-500 Mg Tabs (Hydrocodone-Acetaminophen) .... Take One Tablet By Mouth Every 6 Hours As Needed For Pain 8)  Soma 350 Mg Tabs (Carisoprodol) .... Take 1 Tab By Mouth Three Times A Day As Needed For Muscle Spasm... 9)  Multivitamins  Tabs (Multiple Vitamin) .... Take 1 Tablet By Mouth Once A Day 10)   Januvia 100 Mg Tabs (Sitagliptin Phosphate) .Marland Kitchen.. 1 Tab Each Am  Allergies (verified): 1)  ! Penicillin 2)  ! Lisinopril (Lisinopril)  Past History:  Past Medical History: Reviewed history from 09/02/2010 and no changes required. 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 Left Knee surgery Right Shoulder surgery Ablation of ulcer  Review of Systems       As stated in the HPI and negative for all other systems.   Physical Exam  General:  Well developed, well nourished, in no acute distress. Head:  normocephalic and atraumatic Mouth:  Poor dentition, gums and palate normal. Oral mucosa normal. Neck:  Neck supple, no JVD. No masses, thyromegaly or abnormal cervical nodes. Chest Wall:  no deformities Lungs:  Clear bilaterally to auscultation and percussion. Heart:  Regular rate and rhythm; no murmurs, rubs,  or bruits. Abdomen:  Bowel sounds positive; abdomen soft and non-tender without masses, organomegaly, or hernias  noted. No hepatosplenomegaly. Msk:  Back normal, normal gait. Muscle strength and tone normal. Pulses:  dorsalis pedis intact bilat.   Extremities:  no edema  Neurologic:  Alert and oriented x 3. Skin:  Intact without lesions or  rashes. Cervical Nodes:  no significant adenopathy Inguinal Nodes:  no significant adenopathy Psych:  Normal affect.   Impression & Recommendations:  Problem # 1:  CARDIOMYOPATHY (ICD-425.4) The patient has no new symptoms. She will continue the meds as listed with the discussion noted below.  Problem # 2:  HYPERTENSION (ICD-401.9) She absolutely refuses to take any more medication. She reluctantly agrees to continue what she is on. We have discussed at length the risk of stroke and she understands that she is not at target blood pressure. I did caution her about salt which she restricts to some degree but I doubt as much as Iike.  Problem # 3:  RENAL INSUFFICIENCY (ICD-588.9) No further evaluation is warrented at this point.  Other Orders: EKG w/ Interpretation (93000)  New Orders:     1)  EKG w/ Interpretation (93000)   Patient Instructions: 1)  Your physician recommends that you schedule a follow-up appointment in: 12 months with Dr Antoine Poche 2)  Your physician recommends that you continue on your current medications as directed. Please refer to the Current Medication list given to you today.   Orders Added: 1)  EKG w/ Interpretation [93000]

## 2010-10-15 ENCOUNTER — Encounter (INDEPENDENT_AMBULATORY_CARE_PROVIDER_SITE_OTHER): Payer: Self-pay | Admitting: *Deleted

## 2010-10-15 ENCOUNTER — Other Ambulatory Visit: Payer: Self-pay | Admitting: Pulmonary Disease

## 2010-10-15 ENCOUNTER — Other Ambulatory Visit: Payer: MEDICARE

## 2010-10-15 DIAGNOSIS — Z7901 Long term (current) use of anticoagulants: Secondary | ICD-10-CM

## 2010-10-28 ENCOUNTER — Telehealth: Payer: Self-pay | Admitting: Pulmonary Disease

## 2010-10-31 ENCOUNTER — Encounter: Payer: Self-pay | Admitting: Endocrinology

## 2010-10-31 ENCOUNTER — Other Ambulatory Visit: Payer: MEDICARE

## 2010-10-31 ENCOUNTER — Ambulatory Visit (INDEPENDENT_AMBULATORY_CARE_PROVIDER_SITE_OTHER): Payer: MEDICARE | Admitting: Endocrinology

## 2010-10-31 ENCOUNTER — Other Ambulatory Visit: Payer: Self-pay | Admitting: Endocrinology

## 2010-10-31 DIAGNOSIS — E119 Type 2 diabetes mellitus without complications: Secondary | ICD-10-CM

## 2010-10-31 LAB — HEMOGLOBIN A1C: Hgb A1c MFr Bld: 7.9 % — ABNORMAL HIGH (ref 4.6–6.5)

## 2010-10-31 LAB — CONVERTED CEMR LAB: Fructosamine: 299 micromoles/L — ABNORMAL HIGH (ref <285)

## 2010-11-04 NOTE — Assessment & Plan Note (Signed)
Summary: 3 month follow up/ stc   Vital Signs:  Patient profile:   77 year old female Height:      62 inches (157.48 cm) Weight:      135 pounds (61.36 kg) BMI:     24.78 O2 Sat:      95 % on Room air Temp:     97.9 degrees F (36.61 degrees C) oral Pulse rate:   72 / minute BP sitting:   152 / 90  (left arm) Cuff size:   regular  Vitals Entered By: Brenton Grills CMA Duncan Dull) (October 31, 2010 10:49 AM)  O2 Flow:  Room air CC: 3 month F/U/aj Is Patient Diabetic? Yes  5  Referring Provider:  Alroy Dust, MD Primary Provider:  Lalla Brothers  CC:  3 month F/U/aj.  History of Present Illness: pt states she feels well in general.  she brings a record of her cbg's which i have reviewed today.  it varies ffrom 67-100, with no trend throughout the day.    Current Medications (verified): 1)  Coumadin 5 Mg Tabs (Warfarin Sodium) .... As Directed [facility] 2)  Carvedilol 12.5 Mg Tabs (Carvedilol) .... One and 1/2 Tablets Twice A Day 3)  Cozaar 100 Mg Tabs (Losartan Potassium) .... Take 1 Tab By Mouth Once Daily.Marland KitchenMarland Kitchen 4)  Pravastatin Sodium 40 Mg Tabs (Pravastatin Sodium) .Marland Kitchen.. 1 At Bedtime - Hold Due To Muscle Pain 5)  Metformin Hcl 500 Mg Xr24h-Tab (Metformin Hcl) .... 2 Tabs Each Am 6)  Glimepiride 4 Mg Tabs (Glimepiride) .Marland Kitchen.. 1 Tab Each Am 7)  Vicodin 5-500 Mg Tabs (Hydrocodone-Acetaminophen) .... Take One Tablet By Mouth Every 6 Hours As Needed For Pain 8)  Soma 350 Mg Tabs (Carisoprodol) .... Take 1 Tab By Mouth Three Times A Day As Needed For Muscle Spasm... 9)  Multivitamins  Tabs (Multiple Vitamin) .... Take 1 Tablet By Mouth Once A Day 10)  Januvia 100 Mg Tabs (Sitagliptin Phosphate) .Marland Kitchen.. 1 Tab Each Am 11)  Precision Xtra Blood Glucose  Strp (Glucose Blood) .... Test Once Daily  Allergies (verified): 1)  ! Penicillin 2)  ! 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    Physical Exam  General:  normal appearance.   Pulses:  dorsalis pedis intact bilat.   Extremities:  no deformity.  no ulcer on the feet.  feet are of normal color and temp.  no edema Neurologic:  sensation is intact to touch on the feet  Additional Exam:  Fructosamine         [H]  299 umol/L (converts to a1c of 6.2) Hemoglobin A1C       [H]  7.9 %    Impression & Recommendations:  Problem # 1:  DIABETES MELLITUS (ICD-250.00) taking these 2 parameters together with her cbg record, dm is overcontrolled  Medications Added to Medication List This Visit: 1)  Glimepiride 2 Mg Tabs (Glimepiride) .Marland Kitchen.. 1 tab each am  Other Orders: T-Fructosamine ([address]109-60454) TLB-A1C / Hgb A1C (Glycohemoglobin) (83036-A1C) Est. Patient  Level III (16109)  Patient Instructions: 1)  Please schedule a follow-up appointment in 3 months. 2)  check your blood sugar 1 time a day.  vary the time of day when you check, between before the 3 meals, and at bedtime.  also check if you have symptoms of your blood sugar being too high or too low.  please keep a record of the readings and bring it to your next appointment here.  please call us sooner if you are having low blood sugar episodes.   3)  blood tests are being  ordered for you today.  please call [phone] to hear your test results. 4)  pending the test results, please continue the same medications for now. 5)  (update: i left message on phone-tree:  reduce amaryl to 2 mg each am). Prescriptions: GLIMEPIRIDE 2 MG TABS (GLIMEPIRIDE) 1 tab each am  #30 x 11   Entered and Authorized by:   Minus Breeding MD   Signed by:   Minus Breeding MD on 11/01/2010   Method used:   Electronically to        [facility]  [address] [id number] (retail)       [address]       [place], [place]  [address]       Ph: [phone] or [phone]       Fax: [phone]   RxID:   [id number]    Orders Added: 1)  T-Fructosamine [phone] 2)  TLB-A1C / Hgb A1C (Glycohemoglobin) [83036-A1C] 3)  Est. Patient Level III [32951]

## 2010-11-13 ENCOUNTER — Other Ambulatory Visit (INDEPENDENT_AMBULATORY_CARE_PROVIDER_SITE_OTHER): Payer: MEDICARE

## 2010-11-13 DIAGNOSIS — Z7901 Long term (current) use of anticoagulants: Secondary | ICD-10-CM

## 2010-11-13 NOTE — Progress Notes (Signed)
Summary: results of protime/lmomtcb  Phone Note Outgoing Call   Summary of Call: lmomtcb for pt to get her protime results----on coumadin 5mg     taking 1 tab x monday and 1/2  tab x 6 days----cont the same dose and repeat in 1 month.Marland KitchenMarland KitchenSudie Bandel will need to call the next day for her results as this was done on 2/29.  thanks Randell Loop CMA  October 28, 2010 12:27 PM    attempted to call pt today to review lab results---unable to leave a message on the home number---will try back later Randell Loop CMA  October 29, 2010 12:42 PM   Follow-up for Phone Call        called pt and left messages for her to return my call---which pt never did-----called today and lmom to make pt aware of protime results and no change in her meds this time---pt is to return to the lab in 1 month for repeat protime and stressed in the message that pt is to call the next day for her lab results Randell Loop CMA  November 04, 2010 9:32 AM

## 2010-11-14 ENCOUNTER — Telehealth: Payer: Self-pay | Admitting: *Deleted

## 2010-11-14 DIAGNOSIS — I2699 Other pulmonary embolism without acute cor pulmonale: Secondary | ICD-10-CM

## 2010-11-14 NOTE — Telephone Encounter (Signed)
Called and spoke with pt and she is aware per SN--PROTIME  Slightly too thin and recs to decrease to 1/2 tab daily and repeat protime in 3 weeks.  Pt voiced her understanding of this and is to call the next day for her results

## 2010-11-21 LAB — CBC
HCT: 38.3 % (ref 36.0–46.0)
Hemoglobin: 12.9 g/dL (ref 12.0–15.0)
MCHC: 33.5 g/dL (ref 30.0–36.0)
MCV: 85.2 fL (ref 78.0–100.0)
MCV: 85.3 fL (ref 78.0–100.0)
Platelets: 144 10*3/uL — ABNORMAL LOW (ref 150–400)
Platelets: 168 10*3/uL (ref 150–400)
RDW: 14.3 % (ref 11.5–15.5)
WBC: 6 10*3/uL (ref 4.0–10.5)

## 2010-11-21 LAB — BASIC METABOLIC PANEL
BUN: 14 mg/dL (ref 6–23)
Calcium: 8.9 mg/dL (ref 8.4–10.5)
Chloride: 101 mEq/L (ref 96–112)
Chloride: 104 mEq/L (ref 96–112)
Creatinine, Ser: 1.2 mg/dL (ref 0.4–1.2)
GFR calc Af Amer: 52 mL/min — ABNORMAL LOW (ref >60)
GFR calc non Af Amer: 43 mL/min — ABNORMAL LOW (ref >60)
Glucose, Bld: 145 mg/dL — ABNORMAL HIGH (ref 70–99)
Potassium: 3.7 mEq/L (ref 3.5–5.1)
Sodium: 137 mEq/L (ref 135–145)

## 2010-11-21 LAB — GLUCOSE, CAPILLARY
Glucose-Capillary: 131 mg/dL — ABNORMAL HIGH (ref 70–99)
Glucose-Capillary: 145 mg/dL — ABNORMAL HIGH (ref 70–99)
Glucose-Capillary: 222 mg/dL — ABNORMAL HIGH (ref 70–99)

## 2010-11-21 LAB — DIFFERENTIAL
Basophils Relative: 1 % (ref 0–1)
Eosinophils Absolute: 0.1 10*3/uL (ref 0.0–0.7)
Eosinophils Absolute: 0.1 10*3/uL (ref 0.0–0.7)
Eosinophils Relative: 2 % (ref 0–5)
Lymphs Abs: 2.4 10*3/uL (ref 0.7–4.0)
Monocytes Absolute: 0.4 10*3/uL (ref 0.1–1.0)
Neutrophils Relative %: 41 % — ABNORMAL LOW (ref 43–77)

## 2010-11-21 LAB — URINALYSIS, ROUTINE W REFLEX MICROSCOPIC
Bilirubin Urine: NEGATIVE
Glucose, UA: NEGATIVE mg/dL
Hgb urine dipstick: NEGATIVE
pH: 5.5 (ref 5.0–8.0)

## 2010-11-21 LAB — PROTIME-INR
INR: 1.7 — ABNORMAL HIGH (ref 0.00–1.49)
Prothrombin Time: 18.8 seconds — ABNORMAL HIGH (ref 11.6–15.2)
Prothrombin Time: 19.3 seconds — ABNORMAL HIGH (ref 11.6–15.2)
Prothrombin Time: 19.4 seconds — ABNORMAL HIGH (ref 11.6–15.2)

## 2010-11-21 LAB — CARDIAC PANEL(CRET KIN+CKTOT+MB+TROPI)
CK, MB: 2.6 ng/mL (ref 0.3–4.0)
Relative Index: 1.8 (ref 0.0–2.5)
Total CK: 147 U/L (ref 7–177)
Troponin I: 0.06 ng/mL (ref 0.00–0.06)

## 2010-11-21 LAB — URINE MICROSCOPIC-ADD ON

## 2010-11-21 LAB — TROPONIN I: Troponin I: 0.07 ng/mL — ABNORMAL HIGH (ref 0.00–0.06)

## 2010-11-21 LAB — POCT CARDIAC MARKERS
CKMB, poc: 1.9 ng/mL (ref 1.0–8.0)
Myoglobin, poc: 91.3 ng/mL (ref 12–200)
Troponin i, poc: <0.05 ng/mL (ref 0.00–0.09)

## 2010-11-21 LAB — TSH: TSH: 1.574 u[IU]/mL (ref 0.350–4.500)

## 2010-11-21 LAB — CK TOTAL AND CKMB (NOT AT ARMC)
Relative Index: 1.9 (ref 0.0–2.5)
Total CK: 142 U/L (ref 7–177)

## 2010-11-21 LAB — BRAIN NATRIURETIC PEPTIDE: Pro B Natriuretic peptide (BNP): 157 pg/mL — ABNORMAL HIGH (ref 0.0–100.0)

## 2010-11-23 LAB — BASIC METABOLIC PANEL
BUN: 22 mg/dL (ref 6–23)
CO2: 27 mEq/L (ref 19–32)
Chloride: 103 mEq/L (ref 96–112)
Chloride: 105 mEq/L (ref 96–112)
Creatinine, Ser: 1.27 mg/dL — ABNORMAL HIGH (ref 0.4–1.2)
Creatinine, Ser: 1.31 mg/dL — ABNORMAL HIGH (ref 0.4–1.2)
GFR calc Af Amer: 49 mL/min — ABNORMAL LOW (ref >60)
GFR calc non Af Amer: 41 mL/min — ABNORMAL LOW (ref >60)
Glucose, Bld: 168 mg/dL — ABNORMAL HIGH (ref 70–99)
Potassium: 3.9 mEq/L (ref 3.5–5.1)

## 2010-11-23 LAB — CBC
HCT: 33 % — ABNORMAL LOW (ref 36.0–46.0)
HCT: 33.7 % — ABNORMAL LOW (ref 36.0–46.0)
Hemoglobin: 12.5 g/dL (ref 12.0–15.0)
MCHC: 33.6 g/dL (ref 30.0–36.0)
MCHC: 33.8 g/dL (ref 30.0–36.0)
MCHC: 35.7 g/dL (ref 30.0–36.0)
MCV: 85.2 fL (ref 78.0–100.0)
MCV: 86.2 fL (ref 78.0–100.0)
MCV: 86.2 fL (ref 78.0–100.0)
Platelets: 126 10*3/uL — ABNORMAL LOW (ref 150–400)
Platelets: 135 10*3/uL — ABNORMAL LOW (ref 150–400)
Platelets: 138 10*3/uL — ABNORMAL LOW (ref 150–400)
Platelets: 140 10*3/uL — ABNORMAL LOW (ref 150–400)
RBC: 3.73 MIL/uL — ABNORMAL LOW (ref 3.87–5.11)
RBC: 3.83 MIL/uL — ABNORMAL LOW (ref 3.87–5.11)
RBC: 4.01 MIL/uL (ref 3.87–5.11)
RBC: 4.09 MIL/uL (ref 3.87–5.11)
RDW: 14.7 % (ref 11.5–15.5)
RDW: 14.9 % (ref 11.5–15.5)
RDW: 15.1 % (ref 11.5–15.5)
WBC: 4.4 10*3/uL (ref 4.0–10.5)
WBC: 4.7 10*3/uL (ref 4.0–10.5)
WBC: 5.3 10*3/uL (ref 4.0–10.5)
WBC: 5.7 10*3/uL (ref 4.0–10.5)
WBC: 6.1 10*3/uL (ref 4.0–10.5)

## 2010-11-23 LAB — GLUCOSE, CAPILLARY
Glucose-Capillary: 151 mg/dL — ABNORMAL HIGH (ref 70–99)
Glucose-Capillary: 151 mg/dL — ABNORMAL HIGH (ref 70–99)
Glucose-Capillary: 154 mg/dL — ABNORMAL HIGH (ref 70–99)
Glucose-Capillary: 156 mg/dL — ABNORMAL HIGH (ref 70–99)
Glucose-Capillary: 157 mg/dL — ABNORMAL HIGH (ref 70–99)
Glucose-Capillary: 161 mg/dL — ABNORMAL HIGH (ref 70–99)
Glucose-Capillary: 165 mg/dL — ABNORMAL HIGH (ref 70–99)
Glucose-Capillary: 171 mg/dL — ABNORMAL HIGH (ref 70–99)
Glucose-Capillary: 177 mg/dL — ABNORMAL HIGH (ref 70–99)
Glucose-Capillary: 183 mg/dL — ABNORMAL HIGH (ref 70–99)
Glucose-Capillary: 184 mg/dL — ABNORMAL HIGH (ref 70–99)
Glucose-Capillary: 185 mg/dL — ABNORMAL HIGH (ref 70–99)
Glucose-Capillary: 196 mg/dL — ABNORMAL HIGH (ref 70–99)
Glucose-Capillary: 212 mg/dL — ABNORMAL HIGH (ref 70–99)
Glucose-Capillary: 236 mg/dL — ABNORMAL HIGH (ref 70–99)
Glucose-Capillary: 243 mg/dL — ABNORMAL HIGH (ref 70–99)

## 2010-11-23 LAB — PROTIME-INR
INR: 1.2 (ref 0.00–1.49)
INR: 1.9 — ABNORMAL HIGH (ref 0.00–1.49)
Prothrombin Time: 13.9 seconds (ref 11.6–15.2)
Prothrombin Time: 15.5 seconds — ABNORMAL HIGH (ref 11.6–15.2)
Prothrombin Time: 22.8 seconds — ABNORMAL HIGH (ref 11.6–15.2)

## 2010-11-23 LAB — HEPARIN LEVEL (UNFRACTIONATED)
Heparin Unfractionated: 0.44 IU/mL (ref 0.30–0.70)
Heparin Unfractionated: 0.44 IU/mL (ref 0.30–0.70)
Heparin Unfractionated: 0.52 IU/mL (ref 0.30–0.70)
Heparin Unfractionated: 0.54 IU/mL (ref 0.30–0.70)
Heparin Unfractionated: 0.68 IU/mL (ref 0.30–0.70)

## 2010-11-24 LAB — CBC
HCT: 29.9 % — ABNORMAL LOW (ref 36.0–46.0)
HCT: 32.7 % — ABNORMAL LOW (ref 36.0–46.0)
HCT: 35.3 % — ABNORMAL LOW (ref 36.0–46.0)
Hemoglobin: 10.1 g/dL — ABNORMAL LOW (ref 12.0–15.0)
Hemoglobin: 11 g/dL — ABNORMAL LOW (ref 12.0–15.0)
Hemoglobin: 11.9 g/dL — ABNORMAL LOW (ref 12.0–15.0)
Hemoglobin: 12.1 g/dL (ref 12.0–15.0)
MCHC: 33.6 g/dL (ref 30.0–36.0)
MCHC: 34 g/dL (ref 30.0–36.0)
MCV: 85.3 fL (ref 78.0–100.0)
MCV: 86.5 fL (ref 78.0–100.0)
MCV: 84.6 fL (ref 78.0–100.0)
Platelets: 102 10*3/uL — ABNORMAL LOW (ref 150–400)
Platelets: 102 10*3/uL — ABNORMAL LOW (ref 150–400)
Platelets: 123 10*3/uL — ABNORMAL LOW (ref 150–400)
Platelets: 124 10*3/uL — ABNORMAL LOW (ref 150–400)
Platelets: 159 10*3/uL (ref 150–400)
RBC: 3.89 MIL/uL (ref 3.87–5.11)
RBC: 4.12 MIL/uL (ref 3.87–5.11)
RDW: 14.5 % (ref 11.5–15.5)
RDW: 14.5 % (ref 11.5–15.5)
RDW: 14.8 % (ref 11.5–15.5)
RDW: 15.2 % (ref 11.5–15.5)
WBC: 10.2 10*3/uL (ref 4.0–10.5)
WBC: 5.1 10*3/uL (ref 4.0–10.5)
WBC: 5.8 10*3/uL (ref 4.0–10.5)

## 2010-11-24 LAB — DIFFERENTIAL
Basophils Absolute: 0 10*3/uL (ref 0.0–0.1)
Basophils Absolute: 0 10*3/uL (ref 0.0–0.1)
Basophils Relative: 1 % (ref 0–1)
Basophils Relative: 1 % (ref 0–1)
Basophils Relative: 1 % (ref 0–1)
Eosinophils Absolute: 0.1 10*3/uL (ref 0.0–0.7)
Eosinophils Absolute: 0.1 10*3/uL (ref 0.0–0.7)
Eosinophils Relative: 1 % (ref 0–5)
Eosinophils Relative: 0 % (ref 0–5)
Lymphocytes Relative: 43 % (ref 12–46)
Lymphocytes Relative: 19 % (ref 12–46)
Lymphs Abs: 1.9 10*3/uL (ref 0.7–4.0)
Monocytes Absolute: 0.4 10*3/uL (ref 0.1–1.0)
Monocytes Absolute: 0.4 10*3/uL (ref 0.1–1.0)
Monocytes Absolute: 0.4 10*3/uL (ref 0.1–1.0)
Monocytes Relative: 10 % (ref 3–12)
Monocytes Relative: 4 % (ref 3–12)
Neutro Abs: 3.4 10*3/uL (ref 1.7–7.7)
Neutrophils Relative %: 49 % (ref 43–77)

## 2010-11-24 LAB — GLUCOSE, CAPILLARY
Glucose-Capillary: 101 mg/dL — ABNORMAL HIGH (ref 70–99)
Glucose-Capillary: 117 mg/dL — ABNORMAL HIGH (ref 70–99)
Glucose-Capillary: 150 mg/dL — ABNORMAL HIGH (ref 70–99)
Glucose-Capillary: 158 mg/dL — ABNORMAL HIGH (ref 70–99)
Glucose-Capillary: 184 mg/dL — ABNORMAL HIGH (ref 70–99)
Glucose-Capillary: 185 mg/dL — ABNORMAL HIGH (ref 70–99)
Glucose-Capillary: 195 mg/dL — ABNORMAL HIGH (ref 70–99)
Glucose-Capillary: 197 mg/dL — ABNORMAL HIGH (ref 70–99)
Glucose-Capillary: 200 mg/dL — ABNORMAL HIGH (ref 70–99)
Glucose-Capillary: 200 mg/dL — ABNORMAL HIGH (ref 70–99)
Glucose-Capillary: 202 mg/dL — ABNORMAL HIGH (ref 70–99)
Glucose-Capillary: 228 mg/dL — ABNORMAL HIGH (ref 70–99)
Glucose-Capillary: 270 mg/dL — ABNORMAL HIGH (ref 70–99)

## 2010-11-24 LAB — APTT: aPTT: 27 seconds (ref 24–37)

## 2010-11-24 LAB — BASIC METABOLIC PANEL
BUN: 20 mg/dL (ref 6–23)
BUN: 21 mg/dL (ref 6–23)
BUN: 22 mg/dL (ref 6–23)
CO2: 25 mEq/L (ref 19–32)
CO2: 29 mEq/L (ref 19–32)
Calcium: 9.3 mg/dL (ref 8.4–10.5)
Calcium: 9.4 mg/dL (ref 8.4–10.5)
Chloride: 108 mEq/L (ref 96–112)
Creatinine, Ser: 1.02 mg/dL (ref 0.4–1.2)
GFR calc non Af Amer: 43 mL/min — ABNORMAL LOW (ref >60)
Glucose, Bld: 151 mg/dL — ABNORMAL HIGH (ref 70–99)
Glucose, Bld: 182 mg/dL — ABNORMAL HIGH (ref 70–99)
Glucose, Bld: 183 mg/dL — ABNORMAL HIGH (ref 70–99)
Potassium: 3.8 mEq/L (ref 3.5–5.1)
Potassium: 4 mEq/L (ref 3.5–5.1)
Sodium: 137 mEq/L (ref 135–145)
Sodium: 140 mEq/L (ref 135–145)
Sodium: 143 mEq/L (ref 135–145)

## 2010-11-24 LAB — CARDIAC PANEL(CRET KIN+CKTOT+MB+TROPI)
CK, MB: 5 ng/mL — ABNORMAL HIGH (ref 0.3–4.0)
Total CK: 93 U/L (ref 7–177)

## 2010-11-24 LAB — HEMOGLOBIN AND HEMATOCRIT, BLOOD
HCT: 29 % — ABNORMAL LOW (ref 36.0–46.0)
HCT: 33.2 % — ABNORMAL LOW (ref 36.0–46.0)
HCT: 26.6 % — ABNORMAL LOW (ref 36.0–46.0)
HCT: 29.9 % — ABNORMAL LOW (ref 36.0–46.0)
Hemoglobin: 9.6 g/dL — ABNORMAL LOW (ref 12.0–15.0)

## 2010-11-24 LAB — POCT I-STAT, CHEM 8
BUN: 24 mg/dL — ABNORMAL HIGH (ref 6–23)
Calcium, Ion: 1.09 mmol/L — ABNORMAL LOW (ref 1.12–1.32)
Chloride: 108 mEq/L (ref 96–112)
Glucose, Bld: 151 mg/dL — ABNORMAL HIGH (ref 70–99)
HCT: 38 % (ref 36.0–46.0)
TCO2: 24 mmol/L (ref 0–100)

## 2010-11-24 LAB — TYPE AND SCREEN
ABO/RH(D): B POS
Antibody Screen: NEGATIVE

## 2010-11-24 LAB — URINALYSIS, ROUTINE W REFLEX MICROSCOPIC
Bilirubin Urine: NEGATIVE
Ketones, ur: NEGATIVE mg/dL
Nitrite: NEGATIVE
Protein, ur: NEGATIVE mg/dL

## 2010-11-24 LAB — COMPREHENSIVE METABOLIC PANEL
ALT: 16 U/L (ref 0–35)
AST: 18 U/L (ref 0–37)
AST: 29 U/L (ref 0–37)
Albumin: 3.2 g/dL — ABNORMAL LOW (ref 3.5–5.2)
Alkaline Phosphatase: 54 U/L (ref 39–117)
CO2: 26 mEq/L (ref 19–32)
CO2: 31 mEq/L (ref 19–32)
Chloride: 106 mEq/L (ref 96–112)
Chloride: 108 mEq/L (ref 96–112)
Creatinine, Ser: 1.17 mg/dL (ref 0.4–1.2)
Creatinine, Ser: 1.27 mg/dL — ABNORMAL HIGH (ref 0.4–1.2)
GFR calc Af Amer: 49 mL/min — ABNORMAL LOW (ref >60)
GFR calc Af Amer: 54 mL/min — ABNORMAL LOW (ref >60)
GFR calc non Af Amer: 41 mL/min — ABNORMAL LOW (ref >60)
GFR calc non Af Amer: 45 mL/min — ABNORMAL LOW (ref >60)
Glucose, Bld: 132 mg/dL — ABNORMAL HIGH (ref 70–99)
Glucose, Bld: 211 mg/dL — ABNORMAL HIGH (ref 70–99)
Potassium: 3.9 mEq/L (ref 3.5–5.1)
Sodium: 141 mEq/L (ref 135–145)
Total Bilirubin: 0.7 mg/dL (ref 0.3–1.2)
Total Bilirubin: 0.7 mg/dL (ref 0.3–1.2)
Total Bilirubin: 1.8 mg/dL — ABNORMAL HIGH (ref 0.3–1.2)

## 2010-11-24 LAB — ABO/RH: ABO/RH(D): B POS

## 2010-11-24 LAB — TROPONIN I: Troponin I: 0.1 ng/mL — ABNORMAL HIGH (ref 0.00–0.06)

## 2010-11-24 LAB — HEMOGLOBIN A1C
Hgb A1c MFr Bld: 6.5 % — ABNORMAL HIGH (ref 4.6–6.1)
Hgb A1c MFr Bld: 6.9 % — ABNORMAL HIGH (ref 4.6–6.1)
Mean Plasma Glucose: 151 mg/dL

## 2010-11-24 LAB — URINE CULTURE: Colony Count: >=100000

## 2010-11-24 LAB — LIPID PANEL
HDL: 49 mg/dL (ref >39)
Total CHOL/HDL Ratio: 3.3 RATIO

## 2010-11-24 LAB — URINE MICROSCOPIC-ADD ON

## 2010-11-24 LAB — PROTIME-INR
INR: 0.9 (ref 0.00–1.49)
INR: 1 (ref 0.00–1.49)

## 2010-11-24 LAB — POCT CARDIAC MARKERS: Troponin i, poc: <0.05 ng/mL (ref 0.00–0.09)

## 2010-11-24 LAB — D-DIMER, QUANTITATIVE: D-Dimer, Quant: 2.36 ug/mL-FEU — ABNORMAL HIGH (ref 0.00–0.48)

## 2010-11-24 LAB — AMYLASE: Amylase: 121 U/L (ref 27–131)

## 2010-11-24 LAB — HEMOCCULT GUIAC POC 1CARD (OFFICE): Fecal Occult Bld: POSITIVE

## 2010-11-24 LAB — PREPARE RBC (CROSSMATCH)

## 2010-11-24 LAB — LIPASE, BLOOD: Lipase: 82 U/L — ABNORMAL HIGH (ref 11–59)

## 2010-11-24 LAB — HEPARIN LEVEL (UNFRACTIONATED): Heparin Unfractionated: 0.59 IU/mL (ref 0.30–0.70)

## 2010-11-28 ENCOUNTER — Emergency Department (HOSPITAL_COMMUNITY): Payer: Medicare Other

## 2010-11-28 ENCOUNTER — Observation Stay (HOSPITAL_COMMUNITY)
Admission: EM | Admit: 2010-11-28 | Discharge: 2010-12-01 | Disposition: A | Discharge status: Home / Self Care | Payer: Medicare Other | Attending: Internal Medicine | Admitting: Internal Medicine

## 2010-11-28 DIAGNOSIS — I447 Left bundle-branch block, unspecified: Secondary | ICD-10-CM | POA: Insufficient documentation

## 2010-11-28 DIAGNOSIS — I4891 Unspecified atrial fibrillation: Secondary | ICD-10-CM | POA: Insufficient documentation

## 2010-11-28 DIAGNOSIS — I428 Other cardiomyopathies: Secondary | ICD-10-CM | POA: Insufficient documentation

## 2010-11-28 DIAGNOSIS — Z7901 Long term (current) use of anticoagulants: Secondary | ICD-10-CM | POA: Insufficient documentation

## 2010-11-28 DIAGNOSIS — E1142 Type 2 diabetes mellitus with diabetic polyneuropathy: Secondary | ICD-10-CM | POA: Insufficient documentation

## 2010-11-28 DIAGNOSIS — I10 Essential (primary) hypertension: Secondary | ICD-10-CM | POA: Insufficient documentation

## 2010-11-28 DIAGNOSIS — E1149 Type 2 diabetes mellitus with other diabetic neurological complication: Secondary | ICD-10-CM | POA: Insufficient documentation

## 2010-11-28 DIAGNOSIS — Z86718 Personal history of other venous thrombosis and embolism: Secondary | ICD-10-CM | POA: Insufficient documentation

## 2010-11-28 DIAGNOSIS — I2510 Atherosclerotic heart disease of native coronary artery without angina pectoris: Secondary | ICD-10-CM | POA: Insufficient documentation

## 2010-11-28 DIAGNOSIS — E785 Hyperlipidemia, unspecified: Secondary | ICD-10-CM | POA: Insufficient documentation

## 2010-11-28 DIAGNOSIS — R0789 Other chest pain: Principal | ICD-10-CM | POA: Insufficient documentation

## 2010-11-28 LAB — POCT CARDIAC MARKERS
CKMB, poc: 2.8 ng/mL (ref 1.0–8.0)
Myoglobin, poc: 68.6 ng/mL (ref 12–200)
Troponin i, poc: <0.05 ng/mL (ref 0.00–0.09)

## 2010-11-28 LAB — DIFFERENTIAL
Lymphocytes Relative: 36 % (ref 12–46)
Lymphs Abs: 1.8 10*3/uL (ref 0.7–4.0)
Monocytes Relative: 8 % (ref 3–12)
Neutrophils Relative %: 54 % (ref 43–77)

## 2010-11-28 LAB — CBC
HCT: 37.2 % (ref 36.0–46.0)
Hemoglobin: 12.7 g/dL (ref 12.0–15.0)
MCV: 83.4 fL (ref 78.0–100.0)
RBC: 4.46 MIL/uL (ref 3.87–5.11)
WBC: 5.1 10*3/uL (ref 4.0–10.5)

## 2010-11-28 LAB — PROTIME-INR
INR: 1.76 — ABNORMAL HIGH (ref 0.00–1.49)
Prothrombin Time: 20.7 seconds — ABNORMAL HIGH (ref 11.6–15.2)

## 2010-11-28 LAB — BASIC METABOLIC PANEL
CO2: 25 mEq/L (ref 19–32)
Calcium: 9.3 mg/dL (ref 8.4–10.5)
Creatinine, Ser: 1.14 mg/dL (ref 0.4–1.2)
GFR calc Af Amer: 55 mL/min — ABNORMAL LOW (ref >60)
GFR calc non Af Amer: 46 mL/min — ABNORMAL LOW (ref >60)

## 2010-11-29 DIAGNOSIS — R079 Chest pain, unspecified: Secondary | ICD-10-CM

## 2010-11-29 DIAGNOSIS — I059 Rheumatic mitral valve disease, unspecified: Secondary | ICD-10-CM

## 2010-11-29 LAB — BRAIN NATRIURETIC PEPTIDE: Pro B Natriuretic peptide (BNP): 129 pg/mL — ABNORMAL HIGH (ref 0.0–100.0)

## 2010-11-29 LAB — CARDIAC PANEL(CRET KIN+CKTOT+MB+TROPI)
CK, MB: 4.4 ng/mL — ABNORMAL HIGH (ref 0.3–4.0)
CK, MB: 4.2 ng/mL — ABNORMAL HIGH (ref 0.3–4.0)
Relative Index: 2 (ref 0.0–2.5)
Total CK: 275 U/L — ABNORMAL HIGH (ref 7–177)
Troponin I: 0.08 ng/mL — ABNORMAL HIGH (ref 0.00–0.06)
Troponin I: 0.11 ng/mL — ABNORMAL HIGH (ref 0.00–0.06)

## 2010-11-29 LAB — CBC
HCT: 35.5 % — ABNORMAL LOW (ref 36.0–46.0)
Hemoglobin: 11.9 g/dL — ABNORMAL LOW (ref 12.0–15.0)
Hemoglobin: 11.9 g/dL — ABNORMAL LOW (ref 12.0–15.0)
MCHC: 33.4 g/dL (ref 30.0–36.0)
MCV: 84.5 fL (ref 78.0–100.0)
RBC: 4.2 MIL/uL (ref 3.87–5.11)
RDW: 13.7 % (ref 11.5–15.5)
RDW: 13.8 % (ref 11.5–15.5)
WBC: 4.4 10*3/uL (ref 4.0–10.5)

## 2010-11-29 LAB — PROTIME-INR
INR: 1.82 — ABNORMAL HIGH (ref 0.00–1.49)
Prothrombin Time: 21.2 seconds — ABNORMAL HIGH (ref 11.6–15.2)

## 2010-11-29 LAB — COMPREHENSIVE METABOLIC PANEL
Alkaline Phosphatase: 48 U/L (ref 39–117)
BUN: 13 mg/dL (ref 6–23)
CO2: 24 mEq/L (ref 19–32)
Chloride: 102 mEq/L (ref 96–112)
GFR calc non Af Amer: 46 mL/min — ABNORMAL LOW (ref >60)
Glucose, Bld: 262 mg/dL — ABNORMAL HIGH (ref 70–99)
Potassium: 3.5 mEq/L (ref 3.5–5.1)
Total Bilirubin: 0.5 mg/dL (ref 0.3–1.2)

## 2010-11-29 LAB — GLUCOSE, CAPILLARY: Glucose-Capillary: 204 mg/dL — ABNORMAL HIGH (ref 70–99)

## 2010-11-29 LAB — DIFFERENTIAL
Basophils Absolute: 0 10*3/uL (ref 0.0–0.1)
Basophils Relative: 0 % (ref 0–1)
Monocytes Absolute: 0.5 10*3/uL (ref 0.1–1.0)
Neutro Abs: 2.5 10*3/uL (ref 1.7–7.7)

## 2010-11-29 LAB — LIPID PANEL
Cholesterol: 226 mg/dL — ABNORMAL HIGH (ref 0–200)
HDL: 70 mg/dL (ref >39)
LDL Cholesterol: 94 mg/dL (ref 0–99)
Total CHOL/HDL Ratio: 3.2 RATIO
Triglycerides: 310 mg/dL — ABNORMAL HIGH (ref <150)

## 2010-11-29 LAB — HEMOCCULT GUIAC POC 1CARD (OFFICE): Fecal Occult Bld: POSITIVE

## 2010-11-29 LAB — TSH: TSH: 2.085 u[IU]/mL (ref 0.350–4.500)

## 2010-11-29 LAB — MAGNESIUM: Magnesium: 1.9 mg/dL (ref 1.5–2.5)

## 2010-11-29 MED ORDER — IOHEXOL 300 MG/ML  SOLN
100.0000 mL | Freq: Once | INTRAMUSCULAR | Status: AC | PRN
  Administered 2010-11-29: 100 mL via INTRAVENOUS

## 2010-11-30 LAB — PROTIME-INR: Prothrombin Time: 21.2 seconds — ABNORMAL HIGH (ref 11.6–15.2)

## 2010-11-30 LAB — GLUCOSE, CAPILLARY
Glucose-Capillary: 214 mg/dL — ABNORMAL HIGH (ref 70–99)
Glucose-Capillary: 269 mg/dL — ABNORMAL HIGH (ref 70–99)
Glucose-Capillary: 205 mg/dL — ABNORMAL HIGH (ref 70–99)

## 2010-11-30 LAB — CBC
MCV: 84.1 fL (ref 78.0–100.0)
Platelets: 135 10*3/uL — ABNORMAL LOW (ref 150–400)
RDW: 13.7 % (ref 11.5–15.5)
WBC: 6 10*3/uL (ref 4.0–10.5)

## 2010-11-30 LAB — HEMOCCULT GUIAC POC 1CARD (OFFICE): Fecal Occult Bld: POSITIVE

## 2010-12-01 DIAGNOSIS — R079 Chest pain, unspecified: Secondary | ICD-10-CM

## 2010-12-01 LAB — BASIC METABOLIC PANEL
BUN: 26 mg/dL — ABNORMAL HIGH (ref 6–23)
CO2: 23 mEq/L (ref 19–32)
Calcium: 9.4 mg/dL (ref 8.4–10.5)
Chloride: 108 mEq/L (ref 96–112)
Creatinine, Ser: 1.5 mg/dL — ABNORMAL HIGH (ref 0.4–1.2)

## 2010-12-01 LAB — CBC
MCH: 27.4 pg (ref 26.0–34.0)
MCV: 84.6 fL (ref 78.0–100.0)
Platelets: 133 10*3/uL — ABNORMAL LOW (ref 150–400)
RDW: 13.7 % (ref 11.5–15.5)

## 2010-12-01 LAB — PROTIME-INR: Prothrombin Time: 23.8 seconds — ABNORMAL HIGH (ref 11.6–15.2)

## 2010-12-01 NOTE — Consult Note (Signed)
[name], [name] NO.:  [id number]  MEDICAL RECORD NO.:  [id number]           PATIENT TYPE:  I  LOCATION:  3714                         FACILITY:  [facility]  PHYSICIAN:  Marca Ancona, MD      DATE OF BIRTH:  Feb 16, 1928  DATE OF CONSULTATION:  11/29/2010 DATE OF DISCHARGE:                                CONSULTATION   PRIMARY CARDIOLOGIST:  Rollene Rotunda, MD, [facility]  PRIMARY CARE PHYSICIAN:  [name] Cloud. Kriste Basque, MD  HISTORY OF PRESENT ILLNESS:  This is an 77 year old with history of nonischemic cardiomyopathy and a heart catheterization with nonobstructive coronary disease in April 2010, who presents for evaluation of chest pain.  The patient had been doing quite well up until last night.  On Thursday, she says she worked pretty yard in her garden with no problems.  Last night, she was walking in to the drug store for a newer prescription when she developed sharp, very severe lower left lateral chest and flank pain, this lasted for 5-10 minutes. She called EMS and was taken in to the [facility] ER.  In the ER, she had a different dull substernal ache that also lasted a few minutes. The pain was not pleuritic.  It is completely resolved.  She has not had symptoms like this in the recent past.  At baseline, the patient is able to walk on flat ground with no problem.  She gets a little short of breath if she goes up an incline or walks up a flight of stairs.  Her catheterization in April 2010 showed nonobstructive mild coronary artery disease.  She does have a known cardiomyopathy with an EF about 30-35%, this is thought to be nonischemic cardiomyopathy.  She has a history of PE and DVT and she is on Coumadin.  She had a CTA of her chest to look for PE last night that showed no pulmonary embolus.  ALLERGIES:  PENICILLIN and cough with LISINOPRIL.  CURRENT MEDICATIONS: 1. Aspirin 325. 2. Coreg 25 b.i.d. 3. Glimepiride. 4. Losartan 100 mg daily. 5. Protonix 40 mg  daily. 6. Zocor 40 mg daily. 7. Warfarin.  PAST MEDICAL HISTORY: 1. Nonobstructive coronary artery disease, heart catheterization in     April 2010 showed mild nonobstructive CAD. 2. Nonischemic cardiomyopathy, last echo was in July 2010 showed EF of     30-35% with septal dyssynergy, inferior akinesis, and atrial septal     aneurysm. 3. Diabetes. 4. Chronic left bundle-branch block. 5. History of pulmonary embolus and DVT, currently on Coumadin. 6. Hypertension. 7. Hyperlipidemia. 8. History of TIA. 9. History of GI AVM. 10.Peripheral neuropathy. 11.Diverticulosis. 12.Subarachnoid hemorrhage in 2001.  SOCIAL HISTORY:  The patient lives in [place] with her daughter.  She does not smoke.  She does not drink alcohol.  FAMILY HISTORY:  No premature coronary artery disease.  REVIEW OF SYSTEMS:  All systems were reviewed and were negative except as noted in history of present illness.  PHYSICAL EXAMINATION:  VITAL SIGNS:  Temperature is 97.8, pulse is 67 and regular, blood pressure is 163/75, when she initially arrived on the floor was 194/111, oxygen saturation 97% on  room air. GENERAL:  This is a well-developed female, in no apparent stress. HEENT:  Normal exam. ABDOMEN:  Soft, nontender.  No hepatosplenomegaly.  Normal bowel sounds. NECK:  There is no thyromegaly or thyroid nodule.  There is no JVD. CARDIOVASCULAR:  Heart regular S1 and S2.  There is an S4.  There is no murmur.  1+ dorsalis pedis pulses bilaterally.  No peripheral edema. LUNGS:  Clear to auscultation bilaterally with normal respiratory effort. EXTREMITIES:  No clubbing or cyanosis. NEUROLOGIC:  Alert and orient x3.  Normal affect.  RADIOLOGY:  Chest CT shows no pulmonary embolus.  EKG, normal sinus rhythm with a left bundle-branch block.  LABS:  Hematocrit 35.6, white count 4.7.  Potassium 3.5, creatinine 1.14.  TSH normal.  Hemoglobin A1c 7.6.  First set of cardiac enzymes, CK 197, MB 4, troponin  0.08.  Second set, CK 217, MB 4.4, troponin 0.09, INR 1.82.  LDL 94.  IMPRESSION:  This is an 77 year old with history nonischemic cardiomyopathy who presents with atypical chest pain and had mild cardiac enzyme elevation.  1. Chest pain.  The patient's chest pain is very atypical.  She has     been pain free since she has arrived on the floor.  She does have a     very slight elevation in her cardiac enzymes with troponins of 0.08     and 0.09.  In looking back through her prior admissions in October     2010 and July 2010, both times she had mildly elevated troponin to     around this level.  I suspect that this current presentation does     not signify acute coronary syndrome.  It can certainly be demand     ischemia in the setting of poor blood pressure control.  The     patient does have a known nonischemic cardiomyopathy but on     examination, she does not appear particularly volume overloaded.     We will repeat her cardiac enzymes at 5 p.m.  If there is no     significant troponin rise and she is stable overnight, we will     likely discharge her home in the morning.  She has atypical chest     pain but does have known nonobstructive coronary artery disease,     therefore we will send her home and we will arrange an outpatient     Myoview. 2. Hypertension, poorly controlled.  We will add amlodipine 5 mg     daily.  She does have decreased EF, but I     doubt that she would take BiDil 3 times a day at home. 3. Cardiomyopathy, volume appears stable.  She is on Coreg and     losartan.  We will check a BNP.  I do not think she is to be     diuresed.     Marca Ancona, MD     DM/MEDQ  D:  11/29/2010  T:  11/30/2010  Job:  [id number]  cc:   [name] Cloud. Kriste Basque, MD  Electronically Signed by Marca Ancona MD on 12/01/2010 09:17:42 AM

## 2010-12-03 ENCOUNTER — Telehealth: Payer: Self-pay | Admitting: Pulmonary Disease

## 2010-12-03 NOTE — Telephone Encounter (Signed)
Called, spoke with pt.  Stats she was in [facility] [place] from 4/13-4/16 d/t chest pain under left breast.  States they told her to f/u with SN 7-10 days after discharge.  Pt requesting HFU appt.  Also, states they d/c'd coumadin.  She would like to know is she will be put on something else to replace it.  Dr. Kriste Basque, pls advise.

## 2010-12-03 NOTE — Telephone Encounter (Signed)
Called, spoke with pt.  She was informed to continue meds as instructed per hospital discharge instructions and SN will discuss med regiman at OV on 12/12/10 at 10:30am -- pt verbalized understanding of this.

## 2010-12-03 NOTE — Telephone Encounter (Signed)
Per SN---they would have told her this in the hospital--she will need to cont her med as before and rov next week with SN and they will discuss at this time.  thanks

## 2010-12-04 ENCOUNTER — Encounter: Payer: Self-pay | Admitting: Cardiology

## 2010-12-08 ENCOUNTER — Other Ambulatory Visit (INDEPENDENT_AMBULATORY_CARE_PROVIDER_SITE_OTHER): Payer: Medicare Other

## 2010-12-08 DIAGNOSIS — I2699 Other pulmonary embolism without acute cor pulmonale: Secondary | ICD-10-CM

## 2010-12-08 NOTE — Discharge Summary (Signed)
[name], [name]            ACCOUNT NO.:  [id number]  MEDICAL RECORD NO.:  [id number]           PATIENT TYPE:  I  LOCATION:  3714                         FACILITY:  [facility]  PHYSICIAN:  Zannie Cove, MD     DATE OF BIRTH:  [birth year]  DATE OF ADMISSION:  11/28/2010 DATE OF DISCHARGE:                              DISCHARGE SUMMARY   PRIMARY CARE PHYSICIAN:  Lonzo Cloud. Kriste Basque, MD  CARDIOLOGIST:  Rollene Rotunda, MD, [facility], [facility].  DISCHARGE DIAGNOSES: 1. Atypical chest pain, resolved for outpatient Myoview. 2. Nonischemic cardiomyopathy with EF of 30-35%. 3. History of nonobstructive CAD by cath in April 2010. 4. History of chronic left bundle branch block. 5. History of PE in June 2010.  Coumadin being discontinued. 6. History of paroxysmal AFib remained in sinus rhythm.  Coumadin     being discontinued due to GI bleeds as well as subarachnoid     hemorrhage in 2001. 7. Type 2 diabetes. 8. Hypertension. 9. Dyslipidemia. 10.History of peripheral neuropathy. 11.History of diverticulosis. 12.Trace heme-positive stools through outpatient colonoscopy.  DISCHARGE MEDICATIONS:  Are as follows: 1. Amlodipine 5 mg p.o. daily. 2. Losartan 50 mg daily. 3. Carvedilol 25 mg 1-1/2 tablets p.o. b.i.d. 4. Glimepiride 1 mg p.o. daily. 5. Januvia 100 mg daily. 6. Pravachol 40 mg daily.  DIAGNOSTICS INVESTIGATIONS: 1. Chest x-ray November 29, 2010, no active cardiopulmonary disease. 2. CT angio of the chest November 29, 2010, no evidence of PE dependent     atelectasis and scattered atherosclerotic calcification. 3. Lower extremity Dopplers negative for DVT.  HOSPITAL COURSE: 1. [name] is a very pleasant 77 year old African American female     presented to the hospital with atypical chest pain, which     subsequently resolved.  Her pain was felt to be musculoskeletal in     the etiology.  However, due to a risk factor, she was seen by     [facility] in  consultation who felt that an outpatient     Myoview would be appropriate.  Of note, she has had a cath in April     2010, where she had 50% LAD but no obstructive coronary disease. 2. Nonischemic cardiomyopathy, stable.  Continued on home medications     with follow up with Dr. Antoine Poche. 3. History of PE in June 2010, since it has been about 2 years now,     her Coumadin is being discontinued. 4. History of paroxysmal atrial fibrillation.  The patient has been in     sinus rhythm through her hospital course as well as multiple     previous hospitalizations.  I did discuss with Dr. Antoine Poche about     the safety with Coumadin since she is having trace heme-positive     stools now, history of duodenal ulcer as well as AVMs in the past     and subarachnoid hemorrhage in 2001.  We felt that it was     appropriate to stop her Coumadin at this point in time once her GI     issues are sorted out after the colonoscopy could potentially be     started  on full strength aspirin. 5. Trace heme-positive stools.  The patient has not had any overt     bleeding.  She has had intermittent scant amount of bright red     blood noted on the toilet paper, suspect this is most likely     secondary to hemorrhoids and are being on Coumadin.  She has had a     remote history of colonoscopy over 10 to 20 years ago.  Her     Coumadin is being stopped after discussion with PCP as well as     cardiologist and she will have an outpatient colonoscopy.  We will     follow up with Dr. Russella Dar on Dec 22, 2010 and hopefully evaluate for     colonoscopy then.  DISCHARGE CONDITION:  Stable.  VITAL SIGNS ON DISCHARGE:  Temperature 97.8, pulse 55, blood pressure 138/68, respirations 18, satting 98% room air.  DISCHARGE LABS:  White count of 5.8, hemoglobin 11.6, platelets 133. Chemistry; sodium 140, potassium 4.0, chloride 108, bicarb 23, BUN 26, creatinine 1.5, INR 2.1.  DISCHARGE FOLLOWUP: 1. With primary physician Dr.  Kriste Basque in 7-10 days. 2. Dr. Antoine Poche in 1-2 weeks. 3. Dr. Claudette Head, [facility].  Appointment set for Dec 22, 2010,     10:30 a.m. for evaluation colonoscopy.     Zannie Cove, MD     PJ/MEDQ  D:  12/01/2010  T:  12/01/2010  Job:  [id number]  cc:   Rollene Rotunda, MD, [facility] T. Russella Dar, MD, [facility] Scott M. Kriste Basque, MD  Electronically Signed by Zannie Cove  on 12/08/2010 08:22:08 PM

## 2010-12-10 ENCOUNTER — Encounter: Payer: Self-pay | Admitting: Pulmonary Disease

## 2010-12-12 ENCOUNTER — Encounter: Payer: Self-pay | Admitting: Pulmonary Disease

## 2010-12-12 ENCOUNTER — Ambulatory Visit (INDEPENDENT_AMBULATORY_CARE_PROVIDER_SITE_OTHER): Payer: Medicare Other | Admitting: Pulmonary Disease

## 2010-12-12 DIAGNOSIS — E7800 Pure hypercholesterolemia, unspecified: Secondary | ICD-10-CM

## 2010-12-12 DIAGNOSIS — I2510 Atherosclerotic heart disease of native coronary artery without angina pectoris: Secondary | ICD-10-CM

## 2010-12-12 DIAGNOSIS — M1990 Unspecified osteoarthritis, unspecified site: Secondary | ICD-10-CM

## 2010-12-12 DIAGNOSIS — F411 Generalized anxiety disorder: Secondary | ICD-10-CM

## 2010-12-12 DIAGNOSIS — K219 Gastro-esophageal reflux disease without esophagitis: Secondary | ICD-10-CM

## 2010-12-12 DIAGNOSIS — I2699 Other pulmonary embolism without acute cor pulmonale: Secondary | ICD-10-CM

## 2010-12-12 DIAGNOSIS — I4891 Unspecified atrial fibrillation: Secondary | ICD-10-CM

## 2010-12-12 DIAGNOSIS — G459 Transient cerebral ischemic attack, unspecified: Secondary | ICD-10-CM

## 2010-12-12 DIAGNOSIS — Q279 Congenital malformation of peripheral vascular system, unspecified: Secondary | ICD-10-CM

## 2010-12-12 DIAGNOSIS — E119 Type 2 diabetes mellitus without complications: Secondary | ICD-10-CM

## 2010-12-12 DIAGNOSIS — I10 Essential (primary) hypertension: Secondary | ICD-10-CM

## 2010-12-12 DIAGNOSIS — K2970 Gastritis, unspecified, without bleeding: Secondary | ICD-10-CM

## 2010-12-12 DIAGNOSIS — E042 Nontoxic multinodular goiter: Secondary | ICD-10-CM

## 2010-12-12 NOTE — Progress Notes (Signed)
Subjective:    Patient ID: [name], female    DOB: 1928/08/08, 77 y.o.   MRN: [id number]  HPI 77 y/o BF here for a follow up visit... he has multiple medical problems as noted below...    ~  May 05, 2010:  once again she did not bring meds to visit or list- "same thing, you have it right there" & again asked to bring all meds to every visit... BP controlled when she takes meds regularly;  admits to only taking the Prav40 "now & again";  not checking BS at home &she wants to know "why can't I just incr my intake of cinnamon, garlic, and beets?"... she saw DrEllison 6/11- note reviewed "therapy limited by noncompliance".  ~  September 02, 2010:  she has mult somatic complaints... Coumadin recently decr to 1/2 tab x 7d & continues monitoring thru [place] office protocol... she saw DrEllison 12/11- states he reviewed 3 on-going medical problems, added Januvia 100mg /d but pt never got the message, & never picked up the medication... she saw DrHochrein 11/11 & he up-titrated her Coreg to 18.75mg  Bid (12.5mg  tabs- 1.5tabs Bid) & tol well... BP control remains fair> ?what she's actually taking "my home remedy of vinegar & garlic is helping my BP" she says... NOTE> labs today showed BS=122, A1c=8.5, & rec to take Metformin/ Glimep as perscribed & add the Januvia 100mg /d.  ~  December 12, 2010:  75mo ROV & Post Hosp check> since I saw her last she was [facility] by [facility] (atypCP) & had OV f/u w/ DrHochrein & DrEllison> now off Coumadin rx... States she is doing fine & glad to be off the Coumadin; she did not bring med bottles & doesn't know her meds, she says they are "the same" > reviewed w/ pt the best I could (see meds below) & she is asked to bring all meds to every visit...         Problem List:  Hx of PULMONARY EMBOLISM (ICD-415.19) - presented w/ pleuritic right CP 7/10 w/ CT angio showing pulm emboli & Rx w/ hep> coumadin... protimes followed w/ the [facility] office protocol (she has refused CC)... ~  hosp  by [facility] 9/10 for CP- she ruled out for MI & had f/u CT Angio- NEG for emboli (prev emboli resolved), & VenDopplers- NEG for DVT... ~  Protime 11/10= 49.4/ INR 4.8 (on Coumadin 5mg /d) & she's hi risk for bleed w/ hx DU & AVM- therefore coumadin stopped. ~  1/11:  Coumadin restarted by DrHochrein for PAF on Holter and elevated CHADS2 score, note: she is high risk for coumadin due to non-compliance, hx DU w/ bleed, & hx AVM in brain w/ SAH in 2001. ~  4/12:  Coumadin stopped during the 4/12 hospitalization by [facility] in consult w/ Cards- DrHochrein (see disch summary).  HYPERTENSION (ICD-401.9) - long hx of signif HBP w/ hosp for [facility] in 2001 by DrKritzer (due to an AVM)... previously on Toprol XL & Lotrel- developed an ACE cough... long hx non-compliance w/ med Rx... now on COREG 18.75mg  Bid, COZAAR 100mg /d, & AMLODIPINE 5mg /d... she prefers garlic and lemon juice!!!  ~  4/10:  BP= 160/90 today, pt supposed to be on Bisoprolol 2.5mg  daily, and add Losartan 100mg /d. ~  7/10:  BP= 140/80 on Ziac2.5 + Cozaar100... reminded to take meds every day!!! ~  9/10:  BP= 132/82 on Ziac2.5 + Cozaar 100... continue same! ~  10/10: Hosp by [facility] and meds changed- COREG 3.125Bid + COZAAR100- 1/2 tab/d...  ~  11/10:  BP today= 146/90 & states she doesn't feel well- rec incr Cozaar back to 100mg /d. ~  2/11:  BP= 154/90 & supposed to be on Coreg 6.25Bid & Losartan 100mg /d- Coreg incr to 12.5Bid. ~  9/11:  BP= 150/90 & pt reminded to take meds every day! ~  11/11:  DrHochrein up-titrated Coreg to 18.75mg  Bid, +Cozaar 100mg /d, BP= 142/84> ?compliance. ~  4/12:  BP= 132/76 & stable, continue same meds & remember to bring all meds to the office visits...  CORONARY ARTERY DISEASE (ICD-414.00) & CARDIOMYOPATHY (ICD-425.4) - followed by DrHochrein for Cards> she is ACE intolerant w/ cough, and has long hx of non-compliance w/ med Rx. ~  abn EKG w/ LBBB... ~  2DEcho 3/10 showed diffuse LV HK w/ EF~25%, mild calcif AoV, mild MR, mod  dil LA & atrial septal aneurysm. ~  NuclearStressTest 2/10 showed cardiomyop w/ diffuse HK & EF~35%. ~  Cath 11/15/08 w/ EF= 30-35%, & non-obstructive CAD w/ 30-40% midLAD, 40-70% Diag branch of LAD, calcif in proxCIRC, & 30% midRCA. ~  Repeat 2DECho 7/10 showed infer AK, septal dysynergy, atrial septal aneurym, EF= 30-35%... ~  Hosp 10/10 by [facility] & r/o for MI, meds adjusted as noted. ~  4/12:  Hosp by [facility] w/ atyp CP & r/o for MI> disch for Cards f/u and any further out pt work up...  PAROXYSMAL ATRIAL FIBRILLATION (ICD-427.31) - eval for palpit by DrHochrein w/ PAF on Holter> he ordered Coumadin restarted 2/11... she refused coumadin clinic, therefore protimes followed in [place] office.  HYPERCHOLESTEROLEMIA (ICD-272.0) - she refused meds in past & preferred Rx w/ red yeast rice, oatmeal, herbal remedies. ~  FLP 11/07 showed TChol 285, TG 155, HDL 80, LDL 165... obviously not taking med. ~  FLP 10/09 showed TChol 294, TG 205, HDL 78, LDL 157... she does not want to start statin Rx. ~  Discussed starting Rx in light of her cardiomyopathy & CAD on cath- she refuses statin Rx. ~  FLP 4/10 on diet showed TChol 211, TG 48, HDL 94, LDL 98... rec> keep up the great work! ~  FLP 2/11 on diet showed TChol 278, TG 303, HDL 74, LDL 154... rec> try PRAVACHOL40. ~  pt admits to taking the Prav40 "only now & again"> asked to take it daily. ~  FLP 1/12 showed TChol 198, TG 175, HDL 71, LDL 92  DIABETES MELLITUS (ICD-250.00) - long hx of chronic non-compliance w/ med Rx ~  labs 10/09 showed BS= 319, HgA1c= 11.8.Marland KitchenMarland Kitchen re-started Metformin 500mg -2Bid + Glimepiride 4mg /d, but she c/o abd pain on the Metformin and stopped the Glimepiride on her own... referred to Endocrine. ~  DrEllison opted for Rx w/ ACTOS 45mg /d, & GLIMEPIRIDE 1mg - 1/2 tabAM (intol Metformin). ~  labs 4/10 showed BS= 102, A1c= 6.9.Marland KitchenMarland Kitchen continue same. ~  7/10: regulated in hosp w/ GLUCOPHAGE 500- 1/2Bid + GLIMEPIRIDE 1mg /d... labs showed BS= 144, A1c=  7.4. ~  9/10: labs showed BS= 270, A1c= 7.3.Marland KitchenMarland Kitchen prev intol to Metform500, therefore incr GLIMEPIRIDE 2mg Qam (she refused). ~  11/10: Bs= 201, A1c= 8.4.Marland KitchenMarland Kitchen rec try to incr Metform500Bid & Glimep2mg /d... ~  labs 2/11 showed BS= 188, A1c= 9.0 (hasn't filled Metform in months & ?taking Glimep1mg ?). ~  labs 6/11 showed BS= 238, A1c=9.4 (pharm confirms not taking meds regularly)... rec> take meds daily!  NOTE: Creat=1.2, Umicroalb=pos. ~  followed by DrEllison every 3 months- he tried to add Januvia, she never filled the Rx. ~  labs 1/12 showed BS= 122, A1c= 8.5.Marland KitchenMarland Kitchen  rec to take Metform500Bid, Glimep2mg /d, Januvia100mg /d. ~  4/12 Hosp showed BS= 111-310 & A1c= 7.6; disch back on Metform500Bid, Amaryl 2mg , ?Januvia100 (?if taking)...  GERD (ICD-530.81) - previously on herbal remedies w/ prev eval from [facility] & DrNat-Mann in 1998...  ~  [facility] 6/10 w/ hemetemesis & UGI bleed from DU +HPylori... eval by  GI & Rx w/ OMPEPRAZOLE 40mg  & PYLERA (broken down to the generic Bismuth, Metronidazole, Tetracycline- she is Penicillin allergic) ~  GI f/u DrStark 9/10- HPylori eradicated, continue Omep40mg /d...  DIVERTICULOSIS OF COLON (ICD-562.10) -  ~  last procedure= FlexSig 1998 by DrStark showing divertics only...  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  NEPHROLITHIASIS (ICD-592.0)  DEGENERATIVE JOINT DISEASE (ICD-715.90) - uses VICODIN Prn...  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 sudden HA- eval revealed AVM & conservative approach recommended by Neurosurgery w/ BP control...  NEUROPATHY (ICD-355.9)  ANXIETY (ICD-300.00)   Past Surgical History  Procedure Date  . Knee surgery     left  . Shoulder surgery     right     Outpatient Encounter Prescriptions as of 12/12/2010  Medication Sig Dispense Refill  . amLODipine (NORVASC) 5 MG tablet Take 5 mg by mouth daily.        . carisoprodol (SOMA) 350 MG tablet Take 350 mg by mouth 3 (three) times daily as needed.        . carvedilol (COREG) 12.5 MG tablet 1 and 1/2 tablets twice daily       . glimepiride (AMARYL) 2 MG tablet Take 2 mg by mouth every morning.        Marland Kitchen HYDROcodone-acetaminophen (VICODIN) 5-500 MG per tablet Take 1 tablet by mouth every 6 (six) hours as needed.        Marland Kitchen losartan (COZAAR) 100 MG tablet Take 100 mg by mouth daily.        . Multiple Vitamins-Minerals (MULTIVITAMIN WITH MINERALS) tablet Take 1 tablet by mouth daily.        . pravastatin (PRAVACHOL) 40 MG tablet Take 40 mg by mouth daily.        . sitaGLIPtan (JANUVIA) 100 MG tablet Take 100 mg by mouth daily.        . metFORMIN (GLUCOPHAGE) 500 MG tablet 2 tablets every am       . warfarin (COUMADIN) 5 MG tablet As directed per coumadin clinic         Allergies  Allergen Reactions  . Lisinopril     REACTION: INTOL to ACE's w/ cough  . Penicillins     REACTION: rash    Review of Systems         See HPI - all other systems neg except as noted... The patient complains of dyspnea on exertion.  The patient denies anorexia, fever, weight loss, weight gain, vision loss, decreased hearing, hoarseness, chest pain, syncope, peripheral edema, prolonged cough, headaches, hemoptysis, abdominal pain, melena, hematochezia, severe indigestion/heartburn, hematuria, incontinence, muscle weakness, suspicious skin lesions, transient blindness, difficulty walking, depression, unusual weight change, abnormal bleeding, enlarged lymph nodes, and angioedema.     Objective:   Physical Exam      WD, WN, 77 y/o BF in NAD... GENERAL:  Alert & oriented; pleasant & cooperative... HEENT:  /AT, EOM-wnl, PERRLA, EACs-clear, TMs-wnl, NOSE-clear,  THROAT-clear & wnl. NECK:  Supple w/ fairROM; no JVD;  normal carotid impulses w/o bruits; no thyromegaly but sm nodule palpated; no lymphadenopathy. CHEST:  Clear to P & A; without wheezes/ rales/ or rhonchi. HEART:  Regular Rhythm; gr 1/6 SEM, no rubs or gallops heard... ABDOMEN:  Soft & nontender; normal bowel sounds; no organomegaly or masses detected. EXT: without deformities, mild arthritic changes; no varicose veins/ venous insuffic/ or edema. NEURO:  CN's intact; motor testing normal; sensory testing normal; gait normal & balance OK. DERM:  No lesions noted; no rash etc...   Assessment & Plan:   CP, known CAD>  She was adm w/ atypCP & ruled out, likely musculoskeletal, TH service consulted DrMcLean & contacted DrHochrein who indicated out pt work up & agreed w/ stopping her Coumadin...  Hx PE in 2010>  Shew had neg CT Angio this adm...  HBP>  Controlled on meds, she is reminded to bring meds to each & every visit...  HxPAF>  She has maintained NSR and is followed by DrHochrein...  CHOL>  She refuses med Rx...  DM>  Followed by DrEllison, A1c during recent hosp was improved...  GI>  Stable on her herbal remedies that she like so much...  HxAVM & TIA>  Aware, she denies cerebral ischemic symptoms.Marland KitchenMarland Kitchen

## 2010-12-12 NOTE — Patient Instructions (Signed)
Today we updated your med list in our EPIC system...    Continue your current Diabetic meds per DrEllison...    Continue your current BP & Heat meds per DrHochrein...  For your left hand hematoma:    Keep the hand elevated on pillows...    Alternate Ice & Heat for the swelling...    Use Tylenol for the discomfort...  We reviewed your recent hospitalization & your discharge med list... Keep your appt w/ DrStark & DrHochrein... Call for any questions... Let's plan a routine follow up in 6 months, sooner if needed for any problems.Marland KitchenMarland Kitchen

## 2010-12-21 ENCOUNTER — Encounter: Payer: Self-pay | Admitting: Pulmonary Disease

## 2010-12-22 ENCOUNTER — Ambulatory Visit: Payer: Medicare Other | Admitting: Gastroenterology

## 2010-12-24 ENCOUNTER — Encounter: Payer: Self-pay | Admitting: Gastroenterology

## 2010-12-24 ENCOUNTER — Ambulatory Visit (INDEPENDENT_AMBULATORY_CARE_PROVIDER_SITE_OTHER): Payer: Medicare Other | Admitting: Gastroenterology

## 2010-12-24 VITALS — BP 130/80 | HR 64 | Ht 63.5 in | Wt 135.2 lb

## 2010-12-24 DIAGNOSIS — R195 Other fecal abnormalities: Secondary | ICD-10-CM

## 2010-12-24 MED ORDER — PEG-KCL-NACL-NASULF-NA ASC-C 100 G PO SOLR: 1.0000 | Freq: Once | ORAL | Status: AC

## 2010-12-24 NOTE — Patient Instructions (Addendum)
You have been scheduled for a Colonoscopy. Pick up your prep at your pharmacy. cc: Alroy Dust, MD

## 2010-12-24 NOTE — Progress Notes (Signed)
History of Present Illness: This is an 77 year old female referred for evaluation of small volume hematochezia and Hemoccult-positive stools. She was hospitalized in April for atypical chest pain. She was maintained on Coumadin anticoagulation for a history of a pulmonary embolism in June 2010. She related a history of small amounts of bright red blood occurring with bowel movements intermittently since beginning Coumadin a year ago. Trace hemoccult positive stools were documented in the hospital. Due to this mild rectal bleeding and a history of a subarachnoid hemorrhage in 2001 Coumadin was discontinued. She was referred for outpatient colonoscopy. I evaluated her previously in September of 1998 when she underwent a screening flexible sigmoidoscopy showing sigmoid colon. She does not have a prior history of colonoscopy. CT scan performed in December 2006 showed multiple colonic diverticula. She has multiple comorbidities including stable coronary artery disease, a history of paroxysmal atrial fibrillation, diabetes, hypertension and hyperlipidemia.  She denies any loss of appetite, weight loss, change in stool caliber, change in bowel habits, diarrhea, constipation, melena, nausea, vomiting and odynophagia.   Past Medical History  Diagnosis Date  . Pulmonary embolism   . Hypertension   . CAD (coronary artery disease)   . Cardiomyopathy   . Palpitation   . Paroxysmal atrial fibrillation   . Hypercholesterolemia   . Diabetes mellitus   . Nontoxic multinodular goiter   . Pure hypercholesterolemia   . Peptic ulcer, unspecified site, unspecified as acute or chronic, without mention of hemorrhage, perforation, or obstruction   . GERD (gastroesophageal reflux disease)   . Benign neoplasm of adrenal gland   . Diverticulosis of colon (without mention of hemorrhage)   . Calculus of kidney   . 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 DJD (degenerative joint disease)     . Unspecified transient cerebral ischemia   . Anxiety   . Neuropathy   . Unspecified transient cerebral ischemia   . H. pylori infection    Past Surgical History  Procedure Date  . Knee surgery     left  . Shoulder surgery     right  . Cataract extraction     reports that she quit smoking about 27 years ago. Her smoking use included Cigarettes. She has never used smokeless tobacco. She reports that she drinks alcohol. She reports that she does not use illicit drugs. family history includes Brain cancer in her mother; Diabetes in her maternal grandmother; Goiter in her mother; Heart disease in her maternal aunt; and Lung cancer in her mother.  There is no history of Colon cancer. Allergies  Allergen Reactions  . Lisinopril     REACTION: INTOL to ACE's w/ cough  . Penicillins     REACTION: rash   Outpatient Encounter Prescriptions as of 12/24/2010  Medication Sig Dispense Refill  . amLODipine (NORVASC) 5 MG tablet Take 5 mg by mouth daily.        . carisoprodol (SOMA) 350 MG tablet Take 350 mg by mouth 3 (three) times daily as needed.        . carvedilol (COREG) 12.5 MG tablet 1 and 1/2 tablets twice daily       . glimepiride (AMARYL) 2 MG tablet Take 2 mg by mouth every morning.        Marland Kitchen HYDROcodone-acetaminophen (VICODIN) 5-500 MG per tablet Take 1 tablet by mouth every 6 (six) hours as needed.        Marland Kitchen losartan (COZAAR) 100 MG tablet Take 100 mg by mouth daily.        Marland Kitchen  pravastatin (PRAVACHOL) 40 MG tablet Take 40 mg by mouth daily.        . sitaGLIPtan (JANUVIA) 100 MG tablet Take 100 mg by mouth daily.        . metFORMIN (GLUCOPHAGE) 500 MG tablet 2 tablets every am       . Multiple Vitamins-Minerals (MULTIVITAMIN WITH MINERALS) tablet Take 1 tablet by mouth daily.        . peg 3350 powder (MOVIPREP) 100 G SOLR Take 1 kit (100 g total) by mouth once.  1 kit  0  . DISCONTD: warfarin (COUMADIN) 5 MG tablet As directed per coumadin clinic         Review of Systems: Pertinent  positive and negative review of systems were noted in the above HPI section. All other review of systems were otherwise negative.  Physical Exam: General: Well developed , well nourished, elderly female in no acute distress Head: Normocephalic and atraumatic Eyes:  sclerae anicteric, EOMI Ears: Normal auditory acuity Mouth: No deformity or lesions Neck: Supple, no masses or thyromegaly Lungs: Clear throughout to auscultation Heart: Regular rate and rhythm; no murmurs, rubs or bruits Abdomen: Soft, non tender and non distended. No masses, hepatosplenomegaly or hernias noted. Normal bowel sounds Rectal: Deferred to colonoscopy Musculoskeletal: Symmetrical with no gross deformities  Skin: No lesions on visible extremities Pulses:  Normal pulses noted Extremities: No clubbing, cyanosis, edema or deformities noted Neurological: Alert oriented x 4, grossly nonfocal Cervical Nodes:  No significant cervical adenopathy Inguinal Nodes: No significant inguinal adenopathy Psychological:  Alert and cooperative. Normal mood and affect  Assessment and Recommendations:  1. Small volume hematochezia and Hemoccult positive stool. Coumadin anticoagulation recently discontinued. Rule out hemorrhoids, proctitis, colorectal neoplasms and AVMs. The risks, benefits, and alternatives to colonoscopy with possible biopsy and possible polypectomy were discussed with the patient and they consent to proceed. She is at slightly high-risk for complications from sedation and the procedure due to her multiple comorbidities. Coumadin anticoagulation was recently discontinued.  2. Coronary artery disease.  3. Diabetes mellitus. Standard protocol for management of bowel prep and n.p.o.   4. History of pulmonary embolism paroxysmal atrial fibrillation. Coumadin currently on hold.

## 2010-12-26 ENCOUNTER — Ambulatory Visit (AMBULATORY_SURGERY_CENTER): Payer: Medicare Other | Admitting: Gastroenterology

## 2010-12-26 ENCOUNTER — Encounter: Payer: Self-pay | Admitting: Gastroenterology

## 2010-12-26 DIAGNOSIS — K921 Melena: Secondary | ICD-10-CM

## 2010-12-26 DIAGNOSIS — D126 Benign neoplasm of colon, unspecified: Secondary | ICD-10-CM

## 2010-12-26 DIAGNOSIS — K5730 Diverticulosis of large intestine without perforation or abscess without bleeding: Secondary | ICD-10-CM

## 2010-12-26 DIAGNOSIS — R195 Other fecal abnormalities: Secondary | ICD-10-CM

## 2010-12-26 LAB — GLUCOSE, CAPILLARY: Glucose-Capillary: 146 mg/dL — ABNORMAL HIGH (ref 70–99)

## 2010-12-26 MED ORDER — SODIUM CHLORIDE 0.9 % IV SOLN: 500.0000 mL | INTRAVENOUS | Status: DC

## 2010-12-26 NOTE — Patient Instructions (Signed)
Please read the handouts given to you by your recovery room nurse.   Please,  Increase your fiber intake due to your diverticulosis.  HOLD YOUR anticoagulants for 2 WEEKS.   This is very important so you do not bleed.  You may start your coumadin in 2 WEEKS.  You will need another colonoscopy in 2 yrs.  If you have any questions or concerns, please call us at [phone].  Thank-you.

## 2010-12-28 NOTE — H&P (Signed)
[name], [place]            ACCOUNT NO.:  [id number]  MEDICAL RECORD NO.:  [id number]           PATIENT TYPE:  E  LOCATION:  [facility]                         FACILITY:  [facility]  PHYSICIAN:  Eduard Clos, MDDATE OF BIRTH:  01-17-1928  DATE OF ADMISSION:  11/28/2010 DATE OF DISCHARGE:                             HISTORY & PHYSICAL   PRIMARY CARE PHYSICIAN:  Lonzo Cloud. Kriste Basque, MD  CHIEF COMPLAINT:  Chest pain.  HISTORY OF PRESENT ILLNESS:  An 77 year old female with known history of PE, diabetes mellitus type 2, history of nonischemic cardiomyopathy with EF of 30-35, nonobstructive coronary artery disease, last cardiac cath in April 2010, medium-sized atrial septal aneurysm, hypertension, hyperlipidemia, has been experiencing sudden onset of the chest pain while she was going to collect her medication at the Pharmacy and that on last evening around 7:30 p.m., the chest pain was lateral left side of the chest wall, sudden onset, sharp and stabbing in nature, increased with deep inspiration, lasted for less than 10 minutes.  The patient's chest pain relieved after she got some aspirin, was brought into the ER. In the ER, the patient in addition had a CT angio of chest, which was negative for any PE, cardiac enzymes are negative.  The patient's EKG did not show anything acute.  The patient does have a history of LBBB thus having the same feature.  The patient will be admitted for chest pain rule out.  The patient denies any nausea, vomiting, abdominal pain, dysuria, discharge, diarrhea.  Denies any shortness of breath, cough, or phlegm. Denies any fever or chills.  Denies any dizziness, loss of conscious or any focal deficit.  PAST MEDICAL HISTORY:  History of pulmonary emboli, history of DVT on Coumadin, history of nonischemic cardiomyopathy.  The last EF measure was 30-35% on July 2010, history of atrial septal aneurysm, hypertension, hyperlipidemia, diabetes mellitus type  2, diverticulosis, degenerative joint disease, history of AV malformations, previous TIAs, subarachnoid hemorrhage in 2001, peripheral neuropathy, anxiety, GI bleed secondary to ulcer.  PAST SURGICAL HISTORY:  He has had a recent cataract surgery 3 weeks ago.  SOCIAL HISTORY:  The patient lives with her daughter.  Denies smoking cigarettes, drinking alcohol, using illegal drugs.  FAMILY HISTORY:  No history of any premature coronary artery disease.  REVIEW OF SYSTEMS:  As per history of present illness, nothing else significant.  PHYSICAL EXAMINATION:  GENERAL:  The patient examined at bedside, not in acute distress. VITAL SIGNS:  Blood pressure is 164/78, pulse 60, temperature 97.4, respirations 18, O2 sat 98%. HEENT:  Anicteric.  No pallor.  No discharge from ears, eyes, nose, or mouth. CHEST:  Bilateral entry present.  No rhonchi.  No crepitation. HEART:  S1 and S2 heard. ABDOMEN:  Soft, nontender.  Bowel sounds heard. CNS:  The patient is alert, awake, and oriented to time, place, and person.  Moves upper and lower extremities 5/5. EXTREMITIES:  Peripheral pulses felt.  No edema.  LABORATORY DATA:  EKG shows sinus rhythm with LBBB, which is comparable to old EKG, heart rate is around 58 beats per minute.  Chest x-ray shows no active cardiopulmonary abnormalities, cardiac  enlargement.  CT angio of chest shows no evidence of pulmonary embolism, minimal dependent atelectasis, and scattered atherosclerotic calcifications noted.  CBC, WBC 5.1, hemoglobin 12.5, hematocrit 37.2, platelets 139.  PT/INR is 20.7 and 1.76.  Basic metabolic panel, sodium 139, potassium 3.4, chloride 105, carbon dioxide 25, glucose 144, BUN 15, creatinine 1.1, calcium 9.3, CK-MB 3.3, troponin less than 0.05, myoglobin 68.6.  MEDICATIONS PRIOR TO ADMISSION:  The patient is on carvedilol, Coumadin, glimepiride, and Januvia, losartan, metformin, pravastatin, and Vigamox, doses are not known, needs to be  confirmed.  ASSESSMENT: 1. Chest pain, to rule out acute coronary syndrome. 2. Diabetes mellitus type 2. 3. History of hypertension. 4. History of pulmonary embolism, deep vein thrombosis, on Coumadin. 5. History of nonischemic cardiomyopathy with EF 30-35%, last measured     in July 2010. 6. History of nonobstructive coronary artery disease by cardiac     catheterization in April 2010. 7. History of medium-sized atrial septal defect. 8. Recent cataract surgery. 9. Hyperlipidemia. 10.History of arteriovenous malformation. 11.Previous history of subarachnoid hemorrhage in 2001. 12.History of gastrointestinal bleeds.  PLAN: 1. At this time, admit the patient to telemetry. 2. For chest pain, we will cycle cardiac markers.  The patient will be     on aspirin p.r.n., nitroglycerin.  We will get 2D echo. 3. For her diabetes, the patient will on CBG with sliding scale     coverage.  We need to continue home medication, which has to be     verified. 4. We need to verify the patient's home medication and continue if     clinically appropriate, and further recommendation as condition     evolves.     Eduard Clos, MD     ANK/MEDQ  D:  11/29/2010  T:  11/29/2010  Job:  [id number]  cc:   Lonzo Cloud. Kriste Basque, MD  Electronically Signed by Midge Minium MD on 12/28/2010 08:09:21 AM

## 2010-12-29 ENCOUNTER — Other Ambulatory Visit: Payer: Self-pay | Admitting: Pulmonary Disease

## 2010-12-29 ENCOUNTER — Telehealth: Payer: Self-pay | Admitting: *Deleted

## 2010-12-29 NOTE — Telephone Encounter (Signed)
Pt still sleeping, spoke with Tyrone who states she is doing well and without problems. She will call us back if she has any questions or concerns.

## 2010-12-30 NOTE — Assessment & Plan Note (Signed)
[facility]                            CARDIOLOGY OFFICE NOTE   [name], [name]                   MRN:          [id number]  DATE:10/02/2008                            DOB:          May 23, 1928    PRIMARY CARE PHYSICIAN:  Lonzo Cloud. Kriste Basque, MD   REASON FOR PRESENTATION:  Evaluate the patient with abnormal stress  perfusion study.   HISTORY OF PRESENT ILLNESS:  The patient is a very lovely and bright 77-  year-old [place] American female with multiple cardiovascular risk  factors.  She does report hospitalization for some atypical pain and I  see a note from 47.  She reports having had a catheterization, does  not remember when this was.  She was told when this was done that she  had normal coronary arteries.  I do not have these records.   Recently, she was describing some chest discomfort.  She said that this  had been happening over 1-2 months.  It was a lower pain beneath her  left ribs.  She felt like her abdomen looks swell.  It would radiate up  her sternum.  It was 6/10 in intensity.  She said drinking Pepto-Bismol  actually helped.  It might last for minutes or hours if she did not take  the Pepto-Bismol.  She felt like it was related to metformin.  She  stopped taking that medication.  She has also stopped taking beta-  blockers and ACE inhibitors in the past.  She just does not want to take  a lot of medicines.  She said that the symptoms stopped over the last 1  or 2 weeks when she stopped taking the metformin.  She said she was also  feeling palpitations at that time and she is no longer having wheeze.  She is no longer having chest discomfort.  When she did have this  discomfort, she said it was a dull discomfort.  There was no associated  nausea, vomiting, or diaphoresis.  She would not have any shortness of  breath.  She is not describing any PND or orthopnea.  She said, now she  can walk a mile-round trip.  She can carry some  wood without getting  this discomfort.   However, the patient was sent for a stress perfusion study because of  the discomfort and risk factors.  This demonstrated that her ejection  fraction was about 35%.  There was no clear ischemia.  There was diffuse  hypokinesis possibly worse in the anterior-apical wall.  Because of  this, the patient was referred.   PAST MEDICAL HISTORY:  1. Hypertension.  2. Hyperlipidemia.  3. Diabetes mellitus.  4. AV malformations.  5. Gastroesophageal reflux disease.  6. TIA.  7. Diverticulosis.  8. Nephrolithiasis.  9. Degenerative joint disease.  10.Anxiety.  11.Benign tumor of the adrenal gland.  12.Neuropathy.   ALLERGIES/INTOLERANCE:  PENICILLIN and METFORMIN.   MEDICATIONS:  1. Onglyza 5 mg daily.  2. Glimepiride 4 mg daily.   SOCIAL HISTORY:  The patient is a widow.  She has one daughter who lives  about 8 miles away.  She is retired.  She quit smoking in 1985.  She  drinks wine occasionally.   FAMILY HISTORY:  Noncontributory for early coronary artery disease,  though she has a small family with no brothers and sisters.   REVIEW OF SYSTEMS:  As stated in the HPI and negative for all other  systems.   PHYSICAL EXAMINATION:  GENERAL:  The patient is pleasant and in no  distress.  VITAL SIGNS:  Blood pressure 160/90, heart rate 76 and regular, and  weight 129 pounds.  HEENT:  Eyes unremarkable; pupils are equal, round, and reactive to  light; fundi not visualized; oral mucosa unremarkable.  NECK:  No jugular venous distention at 45 degrees, carotid upstroke  brisk and symmetric, no bruits, no thyromegaly.  LYMPHATICS:  No cervical, axillary, or inguinal adenopathy.  LUNGS:  Clear to auscultation bilaterally.  BACK:  No costovertebral angle tenderness.  CHEST:  Unremarkable.  HEART:  PMI is broadly sustained and somewhat displaced laterally; S1  and S2 within normal limits, no S3, no S4; no clicks, no rubs, no  murmurs.  ABDOMEN:   Flat; positive bowel sounds, normal in frequency and pitch; no  bruits, no rebound, no guarding, no midline pulsatile mass; no  hepatomegaly, no splenomegaly.  SKIN:  No rashes, no nodules.  EXTREMITIES:  Pulses 2+ throughout, no edema, no cyanosis, no clubbing.  NEUROLOGIC:  Oriented to person, place, and time; cranial nerves II  through XII grossly intact; motor grossly intact.   EKG; sinus rhythm, rate 76, interventricular conduction delay, left  ventricular hypertrophy by voltage criteria, inferior T-wave inversion  cannot exclude ischemia, there maybe repolarization changes.   ASSESSMENT AND PLAN:  1. Cardiomyopathy.  The patient appears to have a reduced ejection      fraction by Cardiolite.  I suspect this is true based on her      physical exam.  I would suspect a dilated cardiomyopathy related to      her hypertension, but could not exclude ischemia.  I would like to      see when the last catheterization was.  If indeed, she has a      reduced ejection fraction on echo and especially if there is      regional wall motion abnormalities and it has been quite a while      since her catheterization, a repeat catheterization would be      indicated if she would allow it.  I am going to start with an      echocardiogram and looking at the old records for catheterization.      In the meantime, she will start an ACE inhibitor.  She agrees to      take this.  I will start with Accupril 10 mg daily.  She will come      back in 2 weeks for BMET and for the echo and for a followup visit.      The next step will be to titrate her meds and include a beta-      blocker.  2. Hypertension.  I am going to manage this in the context of treating      her cardiomyopathy.  We had a long discussion about the importance      of treating this.  She did not want to take a lot of medications,      but she is going to need to be on more aggressive therapy and      comply with this.  She agrees.  3.  Left bundle branch block.  Again, this makes interpretation of the      perfusion study somewhat difficult.  She will have a low threshold      for cardiac catheterization in the future.  4. Diabetes.  Her blood sugar has been out of control.  She did not      want to take the metformin.  She does agree to the medications as      listed and she will work with doctors, Everardo All and [place].  5. Dyslipidemia.  The patient apparently did have some significantly      elevated lipids with an LDL of 165 and her HDL was 80.  I will try      to talk her into a statin overtime if she will agree.  6. Followup.  I will see the patient back in 2 weeks for med      titration, the echocardiogram, and a BMET.     Rollene Rotunda, MD, [facility]  Electronically Signed    JH/MedQ  DD: 10/02/2008  DT: 10/03/2008  Job #: [id number]   cc:   Lonzo Cloud. Kriste Basque, MD

## 2010-12-30 NOTE — Discharge Summary (Signed)
[name], [name]            ACCOUNT NO.:  [id number]   MEDICAL RECORD NO.:  [id number]          PATIENT TYPE:  INP   LOCATION:  3737                         FACILITY:  [facility]   PHYSICIAN:  Lonzo Cloud. Kriste Basque, MD     DATE OF BIRTH:  June 21, 1928   DATE OF ADMISSION:  02/26/2009  DATE OF DISCHARGE:  03/06/2009                               DISCHARGE SUMMARY   FINAL DIAGNOSES:  1. Admitted February 26, 2009, via the emergency room with pleuritic right      chest pain and CT angio showing multiple subsegmental pulmonary      emboli on the right.  2. History of coronary artery disease and cardiomyopathy with inferior      akinesis, septal dyssynergy, and EF in the 30% to 35% range by 2-D      echo this admission.  3. Medium-sized atrial septal aneurysm noted on 2-D echo as well.  4. History of hypertension - controlled on medications.  5. History of hypercholesterolemia - the patient chooses to control      this on diet alone.  6. Diabetes mellitus - medications adjusted this admission.  7. History of gastroesophageal reflux disease - the patient previously      treated herself with herbal remedies.  8. Hospitalization from June 13th to June 17th with hematemesis and      dark stools - evaluation revealed a duodenal ulcer with H. pylori      seen and treated with PYLERA.  9. History of acute blood loss anemia secondary to the duodenal ulcer.  10.History of diverticulosis.  11.History of benign adrenal adenoma.  12.History of nephrolithiasis.  13.Degenerative arthritis.  14.History of AV malformation and previous TIA - she was hospitalized      in 2001 with a subarachnoid hemorrhage from an AV malformation.  15.History of neuropathy.  16.Anxiety.   BRIEF HISTORY AND PHYSICAL:  The patient is a 77 year old black female  recently admitted January 27, 2009, to January 31, 2009, with hematemesis and  a GI bleed from a duodenal ulcer.  Evaluation revealed positive H.  pylori and she was  discharged on PYLERA therapy.  It is unclear how many  days of this medicine she took since she could not afford the [facility] and  pharmacy gave her 3 separate prescriptions in an attempt to make it more  affordable.  On the day of admission, she developed right lateral  posterior pleuritic chest wall pain with a waxing and waning pattern.  In the emergency room, she had a CT angio performed that showed multiple  subsegmental pulmonary emboli on the right.  She noted minimal cough, no  hemoptysis or sputum, but did have mild dyspnea.  She denied fevers,  chills, or sweats, stated her appetite was good.   PAST MEDICAL HISTORY:  Please see above problem list, previous discharge  summary from January 31, 2009, and office EMR notes from Dr. Kriste Basque, Dr.  Antoine Poche, and Dr. Everardo All.   PHYSICAL EXAMINATION:  GENERAL:  Examination at the time of admission  revealed a 77 year old black female in no acute distress.  VITAL SIGNS:  Blood  pressure 120/70, pulse 90 per minute and regular,  respirations 20 per minute and not labored, O2 sat 96% on room air,  temperature 98 degrees.  HEENT:  Teeth in poor repair.  Otherwise negative.  NECK:  No jugular venous distention.  No carotid bruits.  There was  thyromegaly, but no lymphadenopathy.  CHEST:  Clear without wheezes, rales, or rhonchi heard.  The chest wall  was nontender.  CARDIAC:  Regular rhythm and S4, grade 1/6 systolic ejection murmur in  the sternal border.  No rubs heard.  ABDOMEN:  Minimal epigastric discomfort on palpation.  No evidence of  organomegaly or masses.  RECTAL:  Deferred.  EXTREMITIES:  No cyanosis, clubbing, or edema and negative Homans sign.  NEUROLOGIC:  Intact without focal abnormalities detected.  SKIN:  Negative.   LABORATORY DATA:  Chest x-ray showed clear lungs without infiltrates or  effusions and some mild cardiomegaly.  CT angio revealed pulmonary  emboli in the pulmonary artery branches, right upper, middle, and  lower  lobes.  Multinodular thyroid was noted.  CBC showed hemoglobin 12.1,  hematocrit 35.7, white count 5400, 62% segs.  Protime 12.4, INR 0.9, PTT  32 seconds.  Sodium 141, potassium 3.9, chloride 104, CO2 of 31, BUN 18,  creatinine 1.27, blood sugar 132, total bilirubin 0.7, alk phos 54, SGOT  29, SGPT 16, total protein 6.8, albumin 3.1, calcium 9.6.  BNP 148.  D-  dimer positive at 2.36.  CPK 83.  Negative MB and negative troponin.  TSH 1.42, hemoglobin A1c 6.9.  Serial studies were followed throughout  the hospital course and are available in the hospital cumulative  laboratory summary sheet.  CBC at discharge showed hemoglobin of 11.2.  Protime was 22.8 seconds with an INR of 1.9.   HOSPITAL COURSE:  The patient was admitted with a pulmonary embolus to  the right lung.  The etiology was not clear as she was in normal sinus  rhythm.  She did have a known cardiomyopathy with an ejection fraction  in the 30% range and is followed by Dr. Antoine Poche for cardiology.  We  repeated her 2-D echo, which revealed an ejection fraction of 30% to 35%  range, akinesis of the inferior wall, septal dyssynergy, and a medium-  sized atrial septal aneurysm.  She maintained normal sinus rhythm during  the hospital course.  She was started on Coumadin and consultation was  obtained from the GI Service.  They felt that her PYLERA therapy should  eradicate the H. pylori and she should be safe for heparin and Coumadin.  Additional note was made of her previous AV malformation and  subarachnoid hemorrhage in 2001.  She has not had any problem from this  in many years and this was believed related to her hypertension as well.  It was felt as long as she takes her medicines regularly, keeps her  blood pressure in good control, and keeps her protimes carefully  monitored with an INR in the 2-2.5 range that she will hopefully  tolerate Coumadin well and be able to juggle these conflicting medical   conditions.   She was started on heparin and bridged over to Coumadin.  She tolerated  everything well and was felt to be MHB and ready for discharge on March 06, 2009.   Her H. pylori therapy was continued during this hospital course with a  combination of bismuth, Flagyl, and tetracycline (PYLERA therapy).  Her  blood sugars were monitored during the hospital course and  a low dose of  Glucophage was added to her regimen due to elevated sugars.  She  tolerated this well during the hospital course.  She will be followed  carefully in the office.   MEDICATIONS AT DISCHARGE:  1. Coumadin 5 mg tablets - taking one-half tablet for 4 days (Tuesday,      Thursday, Saturday, Sunday) and 1 tablet on Monday, Wednesday,      Friday.  2. Ziac 2.5 mg 1 tablet daily.  3. Cozaar 100 mg 1 tablet daily.  4. Glucophage 500 mg tablets one-half tablet p.o. b.i.d.  5. Glimepiride 1 mg tablet 1 tablet in a.m.  6. Omeprazole 40 mg tablets 1 tablet taken 30 minutes before meals.  7. MiraLax one capful in water daily.  8. Finish at home [facility] therapy (bismuth, metronidazole,      tetracycline) as directed four times a day.   CONDITION ON DISCHARGE:  Improved.   DISPOSITION:  The patient will be discharged home.  Will follow up in  the office on Monday, March 11, 2009, at 12 noon with a protime.      Lonzo Cloud. Kriste Basque, MD  Electronically Signed     SMN/MEDQ  D:  03/06/2009  T:  03/06/2009  Job:  [id number]

## 2010-12-30 NOTE — Consult Note (Signed)
[name], [name]            ACCOUNT NO.:  [id number]   MEDICAL RECORD NO.:  [id number]           PATIENT TYPE:   LOCATION:                                 FACILITY:   PHYSICIAN:  Kirby Funk, MD       DATE OF BIRTH:  February 24, 1928   DATE OF CONSULTATION:  DATE OF DISCHARGE:                                 CONSULTATION   [name] called our answering service today because of vomiting and  black stools.  I called her right back.  She was alert and oriented x3.  She said that she is having episodes of vomiting and black stools.  She  denies any bloody vomitus or blood per rectum.  She states that she has  been having episodes of sweating and overall generalized discomfort.  I advised her that she has go to the closest emergency room now to rule  out the possibility of GI bleed.  She seemed to understand this and was  agreeable and she said she will leave her house right now to go to the  emergency room.      Kirby Funk, MD  Electronically Signed     WF/MEDQ  D:  01/27/2009  T:  01/28/2009  Job:  [id number]

## 2010-12-30 NOTE — Assessment & Plan Note (Signed)
[facility]                            CARDIOLOGY OFFICE NOTE   SHALAINE, PAYSON                   MRN:          [id number]  DATE:10/15/2008                            DOB:          1928-04-18    PRIMARY CARE PHYSICIAN:  Lonzo Cloud. Kriste Basque, MD   REASON FOR PRESENTATION:  Evaluate the patient with an abnormal stress  perfusion study and hypertension.   HISTORY OF PRESENT ILLNESS:  The patient presents for followup of the  above.  At the last appointment, I reviewed a stress perfusion study  which demonstrated an ejection fraction of 65%.  There was no ischemia,  but diffuse hypokinesis with possible worsening in the anterior apical  wall.  She had had some chest discomfort some weeks before this, but  this stopped after she stopped taking metformin.   She presented today for an echocardiogram.  The preliminary suggests  some mildly to moderately reduced ejection fraction, but no regional  wall motion abnormalities.  I reviewed the final on this.   I did convince the patient to start Accupril for management of her  hypertension.  She is reluctant to take medications.  She comes back  today and she has a BMET pending.  She denies any new chest discomfort,  neck or arm discomfort.  She has had no palpitations, presyncope, or  syncope.  She has had no PND or orthopnea.  She has been feeling well  and doing more walking outside as the weather gets better.   PAST MEDICAL HISTORY:  1. Hypertension.  2. Hyperlipidemia.  3. Diabetes mellitus.  4. AV malformations.  5. Gastroesophageal reflux disease.  6. TIA.  7. Diverticulosis.  8. Nephrolithiasis.  9. Degenerative joint disease.  10.Anxiety.  11.Benign tumor of the adrenal gland.  12.Neuropathy.   ALLERGIES/INTOLERANCES:  PENICILLIN and METFORMIN.   MEDICATIONS:  1. Onglyza 5 mg daily.  2. Glimepiride 4 mg daily.  3. Accupril 10 mg daily.   REVIEW OF SYSTEMS:  As stated in the HPI and  negative for all other  systems.   PHYSICAL EXAMINATION:  GENERAL:  The patient is pleasant and in no  distress.  VITAL SIGNS:  Blood pressure 152/98, heart rate 89 and regular, weight  127 pounds, and body mass index 22.  HEENT:  Eyelids unremarkable; pupils are equal, round, and reactive to  light; fundi not visualized; oral mucosa unremarkable.  NECK:  No jugular venous distention at 45 degrees, carotid upstroke  brisk and symmetric, no bruits, no thyromegaly.  LYMPHATICS:  No cervical, axillary, or inguinal adenopathy.  LUNGS:  Clear to auscultation bilaterally.  BACK:  No costovertebral angle tenderness.  CHEST:  Unremarkable.  HEART:  PMI was broadly sustained and somewhat displaced laterally; S1  and S2 within normal limits, no S3, no S4; no clicks, no rubs, no  murmurs.  ABDOMEN:  Flat; positive bowel sounds, normal in frequency and pitch; no  bruits, no rebound, no guarding, no midline pulsatile mass; no  hepatomegaly, no splenomegaly.  SKIN:  No rashes, no nodules.  EXTREMITIES:  Pulses 2+ throughout, no edema, no cyanosis, no clubbing.  NEURO:  Oriented to person, place, and time; cranial nerves II through  XII grossly intact; motor grossly intact.   ASSESSMENT AND PLAN:  1. Cardiomyopathy.  I will review the final echo results.  There does      not appear to be regional wall motion abnormality.  The EF is      mildly reduced, but not severe as the Cardiolite would suggest.      There has been no symptomatic evidence or evidence from the      Cardiolite of ischemic heart disease.  This may well be related to      hypertensive heart disease.  For now, I am going to manage this and      I am going to have a long discussion with her.  She brought her      daughter today.   I will increase the Accupril to 20 mg today.  I will consider beta-  blockers in the future.  1. Hypertension as above.  2. Diabetes.  She is now convinced to have better followup of this and       will follow with Dr. Kriste Basque.  3. Risk reduction.  The next step will be to talk her into a statin.      We will need to move slowly as she has been reluctant to take      certain meds.  She had an LDL 165 and HDL of 80 on her most recent      labs.  We will repeat this in the future and treat as needed.  4. Left bundle-branch block, probably related to her hypertension and      cardiomyopathy.  5. Followup.  I will see her back in 4 weeks for med titration or      sooner if needed.    Rollene Rotunda, MD, [facility]  Electronically Signed   JH/MedQ  DD: 10/15/2008  DT: 10/15/2008  Job #: [id number]   cc:   Lonzo Cloud. Kriste Basque, MD

## 2010-12-30 NOTE — Cardiovascular Report (Signed)
[name], [name]            ACCOUNT NO.:  [id number]   MEDICAL RECORD NO.:  [id number]          PATIENT TYPE:  OIB   LOCATION:  1966                         FACILITY:  [facility]   PHYSICIAN:  Arturo Morton. Riley Kill, MD, FACCDATE OF BIRTH:  1927/10/15   DATE OF PROCEDURE:  DATE OF DISCHARGE:  11/15/2008                            CARDIAC CATHETERIZATION   INDICATIONS:  [name] has a history of hypertension.  She has  reduced overall ejection fraction.  Because of the reduced EF, there was  concern that she might have significant coronary disease.  Dr. Antoine Poche  saw her and referred her for cardiac catheterization.   PROCEDURES:  1. Left heart catheterization.  2. Selective coronary arteriography.  3. Selective left ventriculography.   DESCRIPTION OF THE PROCEDURE:  The patient was brought to the  catheterization laboratory after informed consent, prepped and draped in  the usual fashion.  Through an anterior puncture, the right femoral  artery was easily entered and a 4-French sheath was placed.  She was  given 1 mg of intravenous Versed for sedation and was nicely sedated  with this dose.  Following this, coronary arteriography was performed  without complication.  Ventriculography was performed in the RAO  projection.  There were no complications.  I brought her daughter into  the room for review of the films.   HEMODYNAMIC DATA:  1. Central aortic pressure 157/87, mean 114.  2. Left ventricular pressure 158/23, LVEDP 34.   No gradient or pullback across the aortic valve.   ANGIOGRAPHIC DATA:  1. Ventriculography done in the RAO projection reveals reduced overall      LV function.  Estimated EF would be in the 30-35% range.  There was      some diastolic mitral regurgitation, but in systole, there does not      appear to be a large amount of MR.  A referral to the echo would be      recommended.  2. On plain fluoroscopy, there is calcification of the proximal  circumflex.  3. The left main is large in caliber and free of critical disease.  4. The LAD courses to the apex.  There is minimal luminal irregularity      in the distal LAD, but there does not appear to be critical      obstruction.  There is a diagonal branch, which bifurcates.  The      superior portion of this branch has probably 40% narrowing and      inferior portion possibly 70% at the bottom, but the lower inferior      branch is really quite small.  The LAD after the takeoff of the      diagonal branch probably has about 30-40% narrowing.  There is      diffuse luminal irregularity in LAD distal to this, none of this      appears to be critical.  5. The circumflex is calcified proximally.  There is a first marginal      branch without critical narrowing and a large second marginal      branch. The A-V circumflex courses  posteriorly.  6. The right coronary artery is somewhat tortuous with segmental      plaquing of about 30% in the midvessel.  The distal vessel      terminates as a posterior descending and posterolateral system.      There is an RV branch proximally.   CONCLUSIONS:  1. Moderate reduction in left ventricular function.  2. Scattered disease of the left anterior descending that does not      appear to be critical with maximum stenosis in the range of about      50% after the diagonal takeoff but not likely the source of left      ventricular dysfunction.   DISPOSITION:  At the present time, she will follow up with Dr. Antoine Poche  for medical treatment.      Arturo Morton. Riley Kill, MD, [facility]  Electronically Signed     TDS/MEDQ  D:  11/15/2008  T:  11/16/2008  Job:  [id number]   cc:   CV laboratory  Rollene Rotunda, MD, [facility]  Sean A. Everardo All, MD  Lonzo Cloud. Kriste Basque, MD

## 2010-12-30 NOTE — Discharge Summary (Signed)
[name], [name]            ACCOUNT NO.:  [id number]   MEDICAL RECORD NO.:  [id number]          PATIENT TYPE:  INP   LOCATION:  3739                         FACILITY:  [facility]   PHYSICIAN:  Lonzo Cloud. Kriste Basque, MD     DATE OF BIRTH:  1927-12-16   DATE OF ADMISSION:  01/27/2009  DATE OF DISCHARGE:  01/31/2009                               DISCHARGE SUMMARY   FINAL DIAGNOSES:  1. Admitted January 27, 2009, with hematemesis and dark stools.      Evaluation revealed an upper GI bleed with a duodenal ulcer.      Further evaluation revealed positive for Helicobacter pylori.  The      patient was treated with PPI therapy and placed on Pylera at      discharge for the Helicobacter pylori.  2. Acute blood loss anemia secondary to duodenal ulcer bleed.  3. History of hypertension - controlled on medications.  4. History of coronary artery disease and cardiomyopathy.  5. Hypercholesterolemia.  6. Diabetes mellitus.  7. History of gastroesophageal reflux disease - the patient had      previously preferred herbal remedies.  8. Diverticulosis.  9. History of benign adrenal adenoma.  10.History of nephrolithiasis.  11.Degenerative arthritis.  12.History of arteriovenous malformation and previous transient      ischemic attack - she was hospitalized in 2001 with a subarachnoid      hemorrhage from an arteriovenous malformation.  13.History of neuropathy.  14.Anxiety.   BRIEF HISTORY AND PHYSICAL:  The patient is a 77 year old black female  who presented to the emergency room on January 27, 2009, with nausea,  vomiting of coffee-ground and blood-stained material followed by black  stool.  She denied heartburn or indigestion and denied abdominal pain.  She had no previous history of ulcers or GI bleeding, but has had a  history of gastroesophageal reflux disease and was previously prescribed  proton pump inhibitors (Zegerid).  The patient preferred herbal remedies  and had stopped the PPI last year.   Review of her old chart showed a  previous evaluation from Dr. Sharyn Lull and Dr. Elsie Amis in 1998.   PAST MEDICAL HISTORY:  As noted above, she has a history of  hypertension, which is controlled with Ziac and Cozaar.  She has a  history of ACE cough.  She has a history of coronary artery disease and  cardiomyopathy, was evaluated earlier this year by Dr. Antoine Poche for  cardiology.  She has an abnormal EKG with left bundle-branch block, 2-D  echo showing diffuse LV hypokinesis and ejection fraction of 25% range.  She had an abnormal Myoview scan showing cardiomyopathy with diffuse  hypokinesis and ejection fraction estimated at 35%.  She underwent  catheterization in April 2010 showing nonobstructive coronary disease  and is under medical management.  She has a history of  hypercholesterolemia and was previously on Vytorin.  She stopped this on  her own and prefers to control it with diet alone.  Her last cholesterol  in April was 211 with an HDL of 94 and LDL of 98.  She has diabetes  mellitus and has  been seen by Dr. Everardo All.  She has a good bit of  problem with medications due to perceived side effects and stopped all  of her medicines in 2009.  She prefers to control her diabetes with diet  alone, but had not been successful.  She was recommended to take Actos  and glimepiride as she is intolerant to METFORMIN.  She refused the  Actos therapy, however.  She has a history of diverticulosis and has  seen Dr. Russella Dar in the past.  She has history of benign adrenal adenoma  on the right.  She has a remote history of nephrolithiasis.  She has  known degenerative arthritis.  In 2001, she was hospitalized by Dr.  Gerlene Fee with a subarachnoid hemorrhage due to an AV malformation.  She  was thought to have had a TIA in the past.  She has a known neuropathy.  She is quite anxious.   PHYSICAL EXAMINATION:  GENERAL:  An elderly black female in mild  distress with GI bleed.  VITAL SIGNS:  Blood  pressure 130/70, pulse 70 and regular, respirations  18 per minute not labored, temperature 98 degrees, O2 sat 100% on room  air.  HEENT: Unremarkable.  Mucous membranes were somewhat dry.  NECK:  No jugular venous distention.  No carotid bruits.  No thyromegaly  or lymphadenopathy.  CHEST:  Clear to percussion and auscultation.  CARDIAC:  Normal S1 and S2 with a regular rate and rhythm with no rubs  or gallops heard.  ABDOMEN:  Soft and nontender except for minimal discomfort in the  epigastrium.  Normal bowel sounds.  No evidence of organomegaly or  masses.  EXTREMITIES:  No cyanosis, clubbing, or edema.  NEUROLOGIC:  Intact without focal abnormalities detected.   LABORATORY DATA:  CT scan of the abdomen and pelvis showed colonic  diverticulosis, 1.2-cm right adrenal adenoma; lower thoracic and lumbar  degenerative changes with grade 1 spondylolisthesis L4 and L5.  Hemoglobin 10.7, hematocrit 32.3, white count 10,200, and platelet count  159,000.  Stool for occult blood was positive.  Protime 13.8, INR 1.0,  PTT 27 seconds.  Sodium 138, potassium 3.8, chloride 106, CO2 of 24, BUN  74, creatinine 1.27, blood sugar 285, bilirubin 0.7, alk phos 39, SGOT  18, SGPT 14, total protein 6.4, albumin 3.2, calcium 8.7.  Lipase was  84.  Urinalysis showed some leukocytes, otherwise negative.  Blood type  was B+.  She was transfused 2 units.  Serial H&H were followed and  hemoglobin dropped to 8.9 requiring transfusion.  Post-transfusion  hemoglobin improved to 10-11 range.   HOSPITAL COURSE:  The patient was admitted with an upper GI bleed.  She  was given IV fluids and stabilized.  She was typed and crossed and  transfused 2 units of packed cells.  She was placed on Protonix b.i.d.  She was seen by the [facility], Dr. Christella Hartigan  attending.  He performed an endoscopy on January 28, 2009.  This revealed a  nonbleeding duodenal ulcer with a visible vessel.  This was treated  with  epinephrine injection and BICAP cautery.  She was followed clinically  post endoscopy.  She had no evidence or re-bleeding.  Her hemoglobin  stabilized post transfusion in the 10-11 range.  The biopsy returned  positive for H. pylori.  Dr. Christella Hartigan recommended treatment with Pylera  and continue the Protonix PPI.  The patient was ambulated on the ward.  Her home medicines were restarted and she was felt to be  MHP and ready  for discharge on January 31, 2009.   MEDICATIONS AT DISCHARGE:  1. Pylera 3 capsules q.i.d. for 10 days.  2. Protonix 40 mg p.o. b.i.d.  3. Ziac 2.5 one tablet daily.  4. Cozaar 100 mg p.o. daily.  5. Glimepiride 1 mg tablet one-half tablet each morning.  6. Tylenol 2 tablets every 4 hours as needed for pain.   DISPOSITION:  The patient being discharged home, will take the 10-day  Pylera treatment program and remain on Protonix.  We will follow her  back in the office in 1 month.   CONDITION AT DISCHARGE:  Improved.      Lonzo Cloud. Kriste Basque, MD  Electronically Signed     SMN/MEDQ  D:  01/31/2009  T:  01/31/2009  Job:  [id number]

## 2010-12-31 ENCOUNTER — Ambulatory Visit: Payer: Self-pay | Admitting: Pulmonary Disease

## 2011-01-02 ENCOUNTER — Encounter: Payer: Self-pay | Admitting: Gastroenterology

## 2011-01-02 NOTE — Discharge Summary (Signed)
. [facility]  Patient:    [name], [name]                     MRN: [id number] Adm. Date:  [id number] Disc. Date: [id number] Attending:  Gerald Dexter                           Discharge Summary  PRIMARY DIAGNOSIS:  Arteriovenous malformation.  Subarachnoid hemorrhage secondary to arteriovenous malformation.  PRIMARY PROCEDURE:  Cerebral arteriography.  HISTORY:  Ms. Maul is a 77 year old female with sudden onset of headache the morning of admission that was quite severe.  She went to [facility] Emergency Room, where a CAT scan of the brain showed a small hemorrhage posterolateral to the brain stem on the left.  Angiography was performed that showed a small arteriovenous malformation lateral to the brain stem with ______ off the posterior cerebral and superior cerebellar arteries.  The patient was admitted to the ICU for blood pressure control and observation.  When admitted, the patient was awake, alert, and oriented with normal strength and sensation, normal cerebellar function, normal cranial nerve function.  She was watched carefully.  The internal medicine consult was obtained to help control her blood pressure.  She felt better and her blood pressure was under better control.  She was transferred to the floor and was able to increase her activities.  On December 05, 1999, the patient was up, ambulating well, and tolerating a regular diet.  It was felt that she could be discharged home. Options were discussed at that time.  Due to her advanced age and the complex location of this arteriovenous malformation, it was felt that the best option at this time would be to follow things conservatively.  In the future, she would perhaps be referred to one of the medical centers that does stereotactic radiosurgery for a discussion of that possibility.  Her condition was markedly improved on discharge. DD:  12/11/99 TD:  12/11/99 Job: [id number]

## 2011-01-07 ENCOUNTER — Other Ambulatory Visit: Payer: Self-pay | Admitting: *Deleted

## 2011-01-07 ENCOUNTER — Telehealth: Payer: Self-pay | Admitting: Pulmonary Disease

## 2011-01-07 ENCOUNTER — Other Ambulatory Visit (INDEPENDENT_AMBULATORY_CARE_PROVIDER_SITE_OTHER): Payer: Medicare Other

## 2011-01-07 DIAGNOSIS — I2699 Other pulmonary embolism without acute cor pulmonale: Secondary | ICD-10-CM

## 2011-01-07 LAB — PROTIME-INR: INR: 1 ratio (ref 0.8–1.0)

## 2011-01-07 NOTE — Telephone Encounter (Signed)
Spoke with pt and per SN---she will not need the protime checks now that she is not taking the coumadin.  She is to follow up with SN and cardiology at scheduled appts.  Pt is aware to call for any concerns

## 2011-01-07 NOTE — Telephone Encounter (Signed)
Pt is here now and wants to know how much longer she will need to continue having her PT/INR levels checked since she no longer is taking coumadin. Pt was here today to have levels drawn. Please advise. Thanks.

## 2011-02-02 ENCOUNTER — Other Ambulatory Visit: Payer: Medicare Other

## 2011-02-02 ENCOUNTER — Ambulatory Visit (INDEPENDENT_AMBULATORY_CARE_PROVIDER_SITE_OTHER): Payer: Medicare Other | Admitting: Endocrinology

## 2011-02-02 ENCOUNTER — Encounter: Payer: Self-pay | Admitting: Endocrinology

## 2011-02-02 VITALS — BP 138/82 | HR 51 | Temp 98.3°F | Ht 63.5 in | Wt 135.8 lb

## 2011-02-02 DIAGNOSIS — E119 Type 2 diabetes mellitus without complications: Secondary | ICD-10-CM

## 2011-02-02 NOTE — Patient Instructions (Addendum)
blood tests are being ordered for you today.  please call [phone] to hear your test results.  You will be prompted to enter the 9-digit "MRN" number that appears at the top left of this page, followed by #.  Then you will hear the message. pending the test results, please continue the same medications for now. Please make a follow-up appointment in 3 months.  For your safety, please bring you medication bottles. check your blood sugar 1 time a day.  vary the time of day when you check, between before the 3 meals, and at bedtime.  also check if you have symptoms of your blood sugar being too high or too low.  please keep a record of the readings and bring it to your next appointment here.  please call us sooner if you are having low blood sugar episodes.

## 2011-02-02 NOTE — Progress Notes (Signed)
Subjective:    Patient ID: [name], female    DOB: Mar 19, 1928, 77 y.o.   MRN: [id number]  HPI pt states she feels well in general.  no cbg record, but states cbg's are well-controlled Past Medical History  Diagnosis Date  . Pulmonary embolism   . Hypertension   . CAD (coronary artery disease)   . Cardiomyopathy   . Palpitation   . Paroxysmal atrial fibrillation   . Hypercholesterolemia   . Diabetes mellitus   . Nontoxic multinodular goiter   . Pure hypercholesterolemia   . Peptic ulcer, unspecified site, unspecified as acute or chronic, without mention of hemorrhage, perforation, or obstruction   . GERD (gastroesophageal reflux disease)   . Benign neoplasm of adrenal gland   . Diverticulosis of colon (without mention of hemorrhage)   . Calculus of kidney   . 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 DJD (degenerative joint disease)   . Unspecified transient cerebral ischemia   . Anxiety   . Neuropathy   . Unspecified transient cerebral ischemia   . H. pylori infection     Past Surgical History  Procedure Date  . Knee surgery     left  . Shoulder surgery     right  . Cataract extraction     History   Social History  . Marital Status: Widowed    Spouse Name: N/A    Number of Children: 1  . Years of Education: N/A   Occupational History  . retired    Social History Main Topics  . Smoking status: Former Smoker    Types: Cigarettes    Quit date: 08/18/1983  . Smokeless tobacco: Never Used  . Alcohol Use: Yes     occ wine  . Drug Use: No  . Sexually Active: Not on file   Other Topics Concern  . Not on file   Social History Narrative  . No narrative on file    Current Outpatient Prescriptions on File Prior to Visit  Medication Sig Dispense Refill  . amLODipine (NORVASC) 5 MG tablet TAKE 1 TABLET BY MOUTH EVERY DAY  30 tablet  0  . carisoprodol (SOMA) 350 MG tablet Take 350 mg by mouth 3 (three) times daily as  needed.        . carvedilol (COREG) 12.5 MG tablet 1 and 1/2 tablets twice daily       . glimepiride (AMARYL) 2 MG tablet Take 2 mg by mouth every morning.        Marland Kitchen HYDROcodone-acetaminophen (VICODIN) 5-500 MG per tablet Take 1 tablet by mouth every 6 (six) hours as needed.        Marland Kitchen losartan (COZAAR) 100 MG tablet Take 50 mg by mouth daily.       . Multiple Vitamins-Minerals (MULTIVITAMIN WITH MINERALS) tablet Take 1 tablet by mouth daily.        . pravastatin (PRAVACHOL) 40 MG tablet Take 40 mg by mouth daily.        . sitaGLIPtan (JANUVIA) 100 MG tablet Take 100 mg by mouth daily.        . metFORMIN (GLUCOPHAGE) 500 MG tablet 2 tablets every am        Current Facility-Administered Medications on File Prior to Visit  Medication Dose Route Frequency Provider Last Rate Last Dose  . 0.9 %  sodium chloride infusion  500 mL Intravenous Continuous Meryl Dare, MD,FACG        Allergies  Allergen Reactions  .  Lisinopril     REACTION: INTOL to ACE's w/ cough  . Penicillins     REACTION: rash    Family History  Problem Relation Age of Onset  . Diabetes Maternal Grandmother   . Heart disease Maternal Aunt     x2  . Lung cancer Mother   . Brain cancer Mother   . Goiter Mother   . Colon cancer Neg Hx     BP 138/82  Pulse 51  Temp(Src) 98.3 F (36.8 C) (Oral)  Ht 5' 3.5" (1.613 m)  Wt 135 lb 12.8 oz (61.598 kg)  BMI 23.68 kg/m2  SpO2 98%     Review of Systems denies hypoglycemia    Objective:   Physical Exam GENERAL: no distress Neck: there is an easily-palpable, 1.5 cm left thyroid nodule.    Lab Results  Component Value Date   HGBA1C  Value: 7.6 (NOTE)                                                                       According to the ADA Clinical Practice Recommendations for 2011, when HbA1c is used as a screening test:   >=6.5%   Diagnostic of Diabetes Mellitus           (if abnormal result  is confirmed)  5.7-6.4%   Increased risk of developing Diabetes Mellitus   References:Diagnosis and Classification of Diabetes Mellitus,Diabetes Care,2011,34(Suppl 1):S62-S69 and Standards of Medical Care in         Diabetes - 2011,Diabetes Care,2011,34  (Suppl 1):S11-S61.* 11/29/2010      Assessment & Plan:  Dm.  The a1c may not be indicative in view of her control, prob due to her renal insufficiency Multinodular goiter, no new physical findings.

## 2011-02-03 LAB — FRUCTOSAMINE: Fructosamine: 369 umol/L — ABNORMAL HIGH (ref <285)

## 2011-02-06 ENCOUNTER — Ambulatory Visit: Payer: MEDICARE | Admitting: Endocrinology

## 2011-03-09 ENCOUNTER — Other Ambulatory Visit: Payer: Self-pay | Admitting: Pulmonary Disease

## 2011-04-24 ENCOUNTER — Emergency Department (HOSPITAL_COMMUNITY): Payer: Medicare Other

## 2011-04-24 ENCOUNTER — Emergency Department (HOSPITAL_COMMUNITY)
Admission: EM | Admit: 2011-04-24 | Discharge: 2011-04-24 | Disposition: A | Discharge status: Home / Self Care | Payer: Medicare Other | Attending: Emergency Medicine | Admitting: Emergency Medicine

## 2011-04-24 ENCOUNTER — Telehealth: Payer: Self-pay | Admitting: Pulmonary Disease

## 2011-04-24 DIAGNOSIS — E119 Type 2 diabetes mellitus without complications: Secondary | ICD-10-CM | POA: Insufficient documentation

## 2011-04-24 DIAGNOSIS — I10 Essential (primary) hypertension: Secondary | ICD-10-CM | POA: Insufficient documentation

## 2011-04-24 DIAGNOSIS — Z79899 Other long term (current) drug therapy: Secondary | ICD-10-CM | POA: Insufficient documentation

## 2011-04-24 DIAGNOSIS — E785 Hyperlipidemia, unspecified: Secondary | ICD-10-CM | POA: Insufficient documentation

## 2011-04-24 DIAGNOSIS — I509 Heart failure, unspecified: Secondary | ICD-10-CM | POA: Insufficient documentation

## 2011-04-24 DIAGNOSIS — IMO0002 Reserved for concepts with insufficient information to code with codable children: Secondary | ICD-10-CM | POA: Insufficient documentation

## 2011-04-24 DIAGNOSIS — S8990XA Unspecified injury of unspecified lower leg, initial encounter: Secondary | ICD-10-CM | POA: Insufficient documentation

## 2011-04-24 DIAGNOSIS — Y92009 Unspecified place in unspecified non-institutional (private) residence as the place of occurrence of the external cause: Secondary | ICD-10-CM | POA: Insufficient documentation

## 2011-04-24 DIAGNOSIS — M25569 Pain in unspecified knee: Secondary | ICD-10-CM | POA: Insufficient documentation

## 2011-04-24 DIAGNOSIS — W010XXA Fall on same level from slipping, tripping and stumbling without subsequent striking against object, initial encounter: Secondary | ICD-10-CM | POA: Insufficient documentation

## 2011-04-24 DIAGNOSIS — Z86718 Personal history of other venous thrombosis and embolism: Secondary | ICD-10-CM | POA: Insufficient documentation

## 2011-04-24 NOTE — Telephone Encounter (Signed)
I spoke with pt and she states she fell last night and heard a pop in her right knee. Pt states its very painful. I advised pt we did not have any openings today and I advised her either go to urgent care or to the ED to be evaluated. Pt states she is going to go to urgent care now. Nothing further was needed

## 2011-04-24 NOTE — Telephone Encounter (Signed)
ATC na was unable to leave VM. WCB

## 2011-05-02 ENCOUNTER — Other Ambulatory Visit: Payer: Self-pay | Admitting: Pulmonary Disease

## 2011-05-04 ENCOUNTER — Encounter: Payer: Self-pay | Admitting: Endocrinology

## 2011-05-04 ENCOUNTER — Ambulatory Visit (INDEPENDENT_AMBULATORY_CARE_PROVIDER_SITE_OTHER): Payer: Medicare Other | Admitting: Endocrinology

## 2011-05-04 ENCOUNTER — Other Ambulatory Visit (INDEPENDENT_AMBULATORY_CARE_PROVIDER_SITE_OTHER): Payer: Medicare Other

## 2011-05-04 DIAGNOSIS — N259 Disorder resulting from impaired renal tubular function, unspecified: Secondary | ICD-10-CM

## 2011-05-04 DIAGNOSIS — E119 Type 2 diabetes mellitus without complications: Secondary | ICD-10-CM

## 2011-05-04 LAB — BASIC METABOLIC PANEL
BUN: 18 mg/dL (ref 6–23)
Calcium: 9.6 mg/dL (ref 8.4–10.5)
Chloride: 106 mEq/L (ref 96–112)
Creatinine, Ser: 1.1 mg/dL (ref 0.4–1.2)

## 2011-05-04 NOTE — Progress Notes (Signed)
Subjective:    Patient ID: [name], female    DOB: [birth year], 77 y.o.   MRN: [id number]  HPI Pt's metformin was d/c'ed in the hospital 4 mos ago.  pt states she feels well in general.  no cbg record, but states cbg's vary from 79-140.  She does not want to take actos, "because of my heart." Past Medical History  Diagnosis Date  . Pulmonary embolism   . Hypertension   . CAD (coronary artery disease)   . Cardiomyopathy   . Palpitation   . Paroxysmal atrial fibrillation   . Hypercholesterolemia   . Diabetes mellitus   . Nontoxic multinodular goiter   . Pure hypercholesterolemia   . Peptic ulcer, unspecified site, unspecified as acute or chronic, without mention of hemorrhage, perforation, or obstruction   . GERD (gastroesophageal reflux disease)   . Benign neoplasm of adrenal gland   . Diverticulosis of colon (without mention of hemorrhage)   . Calculus of kidney   . 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 DJD (degenerative joint disease)   . Unspecified transient cerebral ischemia   . Anxiety   . Neuropathy   . Unspecified transient cerebral ischemia   . H. pylori infection     Past Surgical History  Procedure Date  . Knee surgery     left  . Shoulder surgery     right  . Cataract extraction     History   Social History  . Marital Status: Widowed    Spouse Name: N/A    Number of Children: 1  . Years of Education: N/A   Occupational History  . retired    Social History Main Topics  . Smoking status: Former Smoker    Types: Cigarettes    Quit date: 08/18/1983  . Smokeless tobacco: Never Used  . Alcohol Use: Yes     occ wine  . Drug Use: No  . Sexually Active: Not on file   Other Topics Concern  . Not on file   Social History Narrative  . No narrative on file    Current Outpatient Prescriptions on File Prior to Visit  Medication Sig Dispense Refill  . amLODipine (NORVASC) 5 MG tablet TAKE 1 TABLET BY  MOUTH EVERY DAY  30 tablet  0  . carisoprodol (SOMA) 350 MG tablet Take 350 mg by mouth 3 (three) times daily as needed.        . carvedilol (COREG) 12.5 MG tablet 1 and 1/2 tablets twice daily       . glimepiride (AMARYL) 2 MG tablet Take 2 mg by mouth every morning.        Marland Kitchen HYDROcodone-acetaminophen (VICODIN) 5-500 MG per tablet Take 1 tablet by mouth every 6 (six) hours as needed.        . Multiple Vitamins-Minerals (MULTIVITAMIN WITH MINERALS) tablet Take 1 tablet by mouth daily.        Marland Kitchen losartan (COZAAR) 100 MG tablet TAKE 1 TABLET BY MOUTH ONCE DAILY...  30 tablet  3  . pravastatin (PRAVACHOL) 40 MG tablet Take 40 mg by mouth daily.        . sitaGLIPtan (JANUVIA) 100 MG tablet Take 100 mg by mouth daily.         Current Facility-Administered Medications on File Prior to Visit  Medication Dose Route Frequency Provider Last Rate Last Dose  . 0.9 %  sodium chloride infusion  500 mL Intravenous Continuous Meryl Dare, MD,FACG  Allergies  Allergen Reactions  . Lisinopril     REACTION: INTOL to ACE's w/ cough  . Penicillins     REACTION: rash    Family History  Problem Relation Age of Onset  . Diabetes Maternal Grandmother   . Heart disease Maternal Aunt     x2  . Lung cancer Mother   . Brain cancer Mother   . Goiter Mother   . Colon cancer Neg Hx     BP 144/82  Pulse 62  Temp(Src) 98 F (36.7 C) (Oral)  Ht 5' 3.5" (1.613 m)  Wt 135 lb (61.236 kg)  BMI 23.54 kg/m2  SpO2 97%  Review of Systems denies hypoglycemia    Objective:   Physical Exam Pulses: dorsalis pedis intact bilat.   Feet: no deformity.  no ulcer on the feet.  feet are of normal color and temp.  no edema Neuro: sensation is intact to touch on the feet  Lab Results  Component Value Date   HGBA1C 9.6* 05/04/2011      Assessment & Plan:  Dm, poor control

## 2011-05-04 NOTE — Patient Instructions (Addendum)
blood tests are being ordered for you today.  please call [phone] to hear your test results.  You will be prompted to enter the 9-digit "MRN" number that appears at the top left of this page, followed by #.  Then you will hear the message. pending the test results, please continue the same medications for now. Please make a follow-up appointment in 3 months.  For your safety, please bring you medication bottles to each appointment. check your blood sugar 1 time a day.  vary the time of day when you check, between before the 3 meals, and at bedtime.  also check if you have symptoms of your blood sugar being too high or too low.  please keep a record of the readings and bring it to your next appointment here.  please call us sooner if you are having low blood sugar episodes. (update: i left message on phone-tree:  You need insulin.  Call if you are agree).

## 2011-05-23 ENCOUNTER — Other Ambulatory Visit: Payer: Self-pay | Admitting: Pulmonary Disease

## 2011-05-26 ENCOUNTER — Other Ambulatory Visit: Payer: Self-pay | Admitting: *Deleted

## 2011-05-26 MED ORDER — AMLODIPINE BESYLATE 5 MG PO TABS: 5.0000 mg | ORAL_TABLET | Freq: Every day | ORAL | Status: DC

## 2011-06-15 ENCOUNTER — Other Ambulatory Visit: Payer: Self-pay | Admitting: Pulmonary Disease

## 2011-06-15 ENCOUNTER — Other Ambulatory Visit (INDEPENDENT_AMBULATORY_CARE_PROVIDER_SITE_OTHER): Payer: Medicare Other

## 2011-06-15 ENCOUNTER — Ambulatory Visit (INDEPENDENT_AMBULATORY_CARE_PROVIDER_SITE_OTHER): Payer: Medicare Other | Admitting: Pulmonary Disease

## 2011-06-15 ENCOUNTER — Encounter: Payer: Self-pay | Admitting: Pulmonary Disease

## 2011-06-15 DIAGNOSIS — K5730 Diverticulosis of large intestine without perforation or abscess without bleeding: Secondary | ICD-10-CM

## 2011-06-15 DIAGNOSIS — E7800 Pure hypercholesterolemia, unspecified: Secondary | ICD-10-CM

## 2011-06-15 DIAGNOSIS — I4891 Unspecified atrial fibrillation: Secondary | ICD-10-CM

## 2011-06-15 DIAGNOSIS — E119 Type 2 diabetes mellitus without complications: Secondary | ICD-10-CM

## 2011-06-15 DIAGNOSIS — M1990 Unspecified osteoarthritis, unspecified site: Secondary | ICD-10-CM

## 2011-06-15 DIAGNOSIS — E042 Nontoxic multinodular goiter: Secondary | ICD-10-CM

## 2011-06-15 DIAGNOSIS — G459 Transient cerebral ischemic attack, unspecified: Secondary | ICD-10-CM

## 2011-06-15 DIAGNOSIS — M25569 Pain in unspecified knee: Secondary | ICD-10-CM

## 2011-06-15 DIAGNOSIS — I10 Essential (primary) hypertension: Secondary | ICD-10-CM

## 2011-06-15 DIAGNOSIS — K219 Gastro-esophageal reflux disease without esophagitis: Secondary | ICD-10-CM

## 2011-06-15 DIAGNOSIS — I428 Other cardiomyopathies: Secondary | ICD-10-CM

## 2011-06-15 DIAGNOSIS — Q279 Congenital malformation of peripheral vascular system, unspecified: Secondary | ICD-10-CM

## 2011-06-15 DIAGNOSIS — I2510 Atherosclerotic heart disease of native coronary artery without angina pectoris: Secondary | ICD-10-CM

## 2011-06-15 LAB — BASIC METABOLIC PANEL
CO2: 26 mEq/L (ref 19–32)
Chloride: 104 mEq/L (ref 96–112)
Glucose, Bld: 206 mg/dL — ABNORMAL HIGH (ref 70–99)
Sodium: 139 mEq/L (ref 135–145)

## 2011-06-15 LAB — HEPATIC FUNCTION PANEL
ALT: 16 U/L (ref 0–35)
Alkaline Phosphatase: 59 U/L (ref 39–117)
Bilirubin, Direct: 0 mg/dL (ref 0.0–0.3)
Total Protein: 8.1 g/dL (ref 6.0–8.3)

## 2011-06-15 LAB — CBC WITH DIFFERENTIAL/PLATELET
Basophils Absolute: 0 10*3/uL (ref 0.0–0.1)
Eosinophils Absolute: 0.1 10*3/uL (ref 0.0–0.7)
Lymphocytes Relative: 29.5 % (ref 12.0–46.0)
MCHC: 33.4 g/dL (ref 30.0–36.0)
Neutrophils Relative %: 59.8 % (ref 43.0–77.0)
Platelets: 160 10*3/uL (ref 150.0–400.0)
RDW: 13.6 % (ref 11.5–14.6)

## 2011-06-15 LAB — LIPID PANEL
Cholesterol: 273 mg/dL — ABNORMAL HIGH (ref 0–200)
Total CHOL/HDL Ratio: 3

## 2011-06-15 LAB — TSH: TSH: 1.88 u[IU]/mL (ref 0.35–5.50)

## 2011-06-15 LAB — HEMOGLOBIN A1C: Hgb A1c MFr Bld: 8.9 % — ABNORMAL HIGH (ref 4.6–6.5)

## 2011-06-15 LAB — LDL CHOLESTEROL, DIRECT: Direct LDL: 170.1 mg/dL

## 2011-06-15 NOTE — Patient Instructions (Signed)
Today we updated your med list in our EPIC system...    Continue your current medications the same...  Today we did your follow up fasting blood work...    Please call the PHONE TREE in a few days for your results...    Dial [id number] & when prompted enter your patient number followed by the # symbol...    Your patient number is:  [id number]#  Let us know if you can't get an appt w/ the orthopedic specialist to check your knee...  Let's plan a follow up eval in 4-6 months.Marland KitchenMarland Kitchen

## 2011-06-22 ENCOUNTER — Telehealth: Payer: Self-pay | Admitting: Pulmonary Disease

## 2011-06-22 DIAGNOSIS — E7800 Pure hypercholesterolemia, unspecified: Secondary | ICD-10-CM

## 2011-06-22 NOTE — Telephone Encounter (Signed)
Called and spoke with pt about her lab results.   See results note

## 2011-06-25 ENCOUNTER — Ambulatory Visit (INDEPENDENT_AMBULATORY_CARE_PROVIDER_SITE_OTHER): Payer: Medicare Other

## 2011-06-25 VITALS — BP 142/80 | HR 63 | Wt 133.0 lb

## 2011-06-25 DIAGNOSIS — E7800 Pure hypercholesterolemia, unspecified: Secondary | ICD-10-CM

## 2011-06-25 NOTE — Assessment & Plan Note (Signed)
A:  Uncontrolled hyperlipidemia with most recent lipid panel: LDL 170, TC 273.  Pt's LDL goal is < 70 based on diagnosis of DM and hx of CAD.  Pt's LDL was near goal at 94 previously in 2012 when compliant with pravastatin 40mg  once daily; However sustained compliance in this patient will be hard to achieve due to her lack of motivation to take medicine.    P: Pt counseled to restart pravastatin three times a week.  If muscle aches return, pt counseled to stop pravastatin immediately and call the office.  If pt continues to be non-compliant with pravastatin, then other classes of cholesterol medications will need to be initiated.   Pt counseled on improving diet by reducing sausage and liver pudding meals from almost every day of the week to only 2-3x a week and to try to cut out soda.  Pt  to follow-up in clinic in 3 months.

## 2011-06-25 NOTE — Progress Notes (Signed)
HPI:  89 yoF presents to clinic in good spirits for her 1st lipid appointment at [facility].  Pt reports the only medicine she takes regularly is Coreg because this is her "most important medication."  Pt states she does not like taking medicines, has a pill box "somewhere" in her house, and often begins taking medications on a regular schedule for a few weeks after an office visit, but then stops them due to dislike of regimen and interference in her life.    Pt's diet is poor.  She reports living alone and only eating 1-2 meals a day, with her first meal usually consisting of buttery grits, sausage, eggs, toast, and liver pudding.  If she has a second meal, pt states she eats a deli [place] or chicken sandwhich on whole wheat bread.  She reports drinking a gallon of milk every 3-4 days, as well as orange juice, sprite, and sweet tea, all diluted with extra water.   SH: Pt reports being a social drinker, but only on holidays.  Otherwise, she only drinks a beer or a "hot toddy" once every 2-3 months.  Pt states she owns 3 acres of land, and rakes all the leaves herself (uses a riding Surveyor, mining for cutting grass).  She also states she walks to the [facility] and post-office about 1/2 mile away when the weather is not too cold, but a recent fall has restricted her usual walking schedule.    Current Outpatient Prescriptions  Medication Sig Dispense Refill  . amLODipine (NORVASC) 5 MG tablet Take 1 tablet (5 mg total) by mouth daily.  30 tablet  6  . carvedilol (COREG) 12.5 MG tablet 1 and 1/2 tablets twice daily       . glimepiride (AMARYL) 2 MG tablet Take 2 mg by mouth every morning.        Marland Kitchen HYDROcodone-acetaminophen (LORCET) 10-650 MG per tablet Take 1 tablet by mouth every 6 (six) hours as needed.        Marland Kitchen losartan (COZAAR) 100 MG tablet TAKE 1 TABLET BY MOUTH ONCE DAILY...  30 tablet  3  . sitaGLIPtan (JANUVIA) 100 MG tablet Take 100 mg by mouth daily.        . pravastatin (PRAVACHOL) 40 MG tablet Take 40 mg  by mouth daily.         Current Facility-Administered Medications  Medication Dose Route Frequency Provider Last Rate Last Dose  . DISCONTD: 0.9 %  sodium chloride infusion  500 mL Intravenous Continuous Eliezer Bottom., MD,FACG       Allergies  Allergen Reactions  . Lisinopril     REACTION: INTOL to ACE's w/ cough  . Penicillins     REACTION: rash

## 2011-07-03 ENCOUNTER — Encounter: Payer: Self-pay | Admitting: Pulmonary Disease

## 2011-07-03 NOTE — Progress Notes (Signed)
Subjective:    Patient ID: [name], female    DOB: [birth year], 77 y.o.   MRN: [id number]  HPI  77 y/o BF here for a follow up visit... he has multiple medical problems as noted below...    ~  May 05, 2010:  once again she did not bring meds to visit or list- "same thing, you have it right there" & again asked to bring all meds to every visit... BP controlled when she takes meds regularly;  admits to only taking the Prav40 "now & again";  not checking BS at home &she wants to know "why can't I just incr my intake of cinnamon, garlic, and beets?"... she saw DrEllison 6/11- note reviewed "therapy limited by noncompliance".  ~  September 02, 2010:  she has mult somatic complaints... Coumadin recently decr to 1/2 tab x 7d & continues monitoring thru [facility] office protocol... she saw DrEllison 12/11- states he reviewed 3 on-going medical problems, added Januvia 100mg /d but pt never got the message, & never picked up the medication... she saw DrHochrein 11/11 & he up-titrated her Coreg to 18.75mg  Bid (12.5mg  tabs- 1.5tabs Bid) & tol well... BP control remains fair> ?what she's actually taking "my home remedy of vinegar & garlic is helping my BP" she says... NOTE> labs today showed BS=122, A1c=8.5, & rec to take Metformin/ Glimep as perscribed & add the Januvia 100mg /d.  ~  December 12, 2010:  65mo ROV & Post Hosp check> since I saw her last she was [place] by [facility] (atypCP) & had OV f/u w/ DrHochrein & DrEllison> now off Coumadin rx... States she is doing fine & glad to be off the Coumadin; she did not bring med bottles & doesn't know her meds, she says they are "the same" > reviewed w/ pt the best I could (see meds below) & she is asked to bring all meds to every visit...  ~  June 15, 2011:  20mo ROV> she tells me that she fell about a month ago & "pulled the tendons in my knee" per DrACollins, treated w/ Hydrocodone 10mg  per Ortho;  BP appears poorly controlled largely due to poor med compliance- she  has once again failed to bring her med bottle or her med list to the OV & she is encouraged to bring all bottle toevery doctor visit for review;  In addition she has stopped her statin med (Prev40) & her FLP today shows [id number] & LDL 170> we will refer to the [facility] for help;  She is overdue for a follow up visit w/ Cards & will call DrHochrein's office for appt; See prob list below:    >She saw DrEllison 9/12 & her A1c= 9.6 on Amaryl monotherapy> he felt her poor control was due to noncompliance (she states intol to Metformin, won't take Actos "due to my heart" & stopped Januvia on her own);  She requested that we do labs and Bs=206, A1c=8.9 & she is asked to f/u w/ her Diabetic specialist to discuss the options left open to her for better control of her DM...    >She saw DrStark 5/12 for sm vol hematochezia, known divertics on CT Abd in 2006, hx prev FlexSig so he performed colonoscopy 5/12 w/mod divertics in sigmoid region & 2 polyps removed (20mm & 12mm), path= tubulovillous adenomas & repeat colon planned ?1yrs per DrStark...         Problem List:  Hx of PULMONARY EMBOLISM (ICD-415.19) - presented w/ pleuritic right CP 7/10 w/ CT angio  showing pulm emboli & Rx w/ hep> coumadin... protimes followed w/ the [facility] office protocol (she has refused CC)... ~  hosp by [facility] 9/10 for CP- she ruled out for MI & had f/u CT Angio- NEG for emboli (prev emboli resolved), & VenDopplers- NEG for DVT... ~  Protime 11/10= 49.4/ INR 4.8 (on Coumadin 5mg /d) & she's hi risk for bleed w/ hx DU & AVM- therefore coumadin stopped. ~  1/11:  Coumadin restarted by DrHochrein for PAF on Holter and elevated CHADS2 score, note: she is high risk for coumadin due to non-compliance, hx DU w/ bleed, & hx AVM in brain w/ [facility] in 2001. ~  4/12:  Coumadin stopped during the 4/12 hospitalization by [facility] in consult w/ Cards- DrHochrein (Disch summary reviewed).  HYPERTENSION (ICD-401.9) - long hx of signif HBP w/ hosp for [facility] in 2001 by  DrKritzer (due to an AVM)> on COREG 18.75mg  Bid, COZAAR 100mg /d, & AMLODIPINE 5mg /d;  previously on Toprol XL & Lotrel- developed an ACE cough; long hx non-compliance w/ med Rx- she prefers garlic and lemon juice!!!  ~  4/10:  BP= 160/90 today, pt supposed to be on Bisoprolol 2.5mg  daily, and add Losartan 100mg /d. ~  7/10:  BP= 140/80 on Ziac2.5 + Cozaar100... reminded to take meds every day!!! ~  9/10:  BP= 132/82 on Ziac2.5 + Cozaar 100... continue same! ~  10/10: Hosp by [facility] and meds changed- COREG 3.125Bid + COZAAR100- 1/2 tab/d...  ~  11/10:  BP today= 146/90 & states she doesn't feel well- rec incr Cozaar back to 100mg /d. ~  2/11:  BP= 154/90 & supposed to be on Coreg 6.25Bid & Losartan 100mg /d- Coreg incr to 12.5Bid. ~  9/11:  BP= 150/90 & pt reminded to take meds every day! ~  11/11:  DrHochrein up-titrated Coreg to 18.75mg  Bid, +Cozaar 100mg /d, BP= 142/84> ?compliance. ~  4/12:  BP= 132/76 & stable, continue same meds & remember to bring all meds to the office visits... ~  10/12:  BP= 160/88 & I suspect noncompliance w/ meds; encouraged to take all meds every day & bring bottles to every visit.  CORONARY ARTERY DISEASE (ICD-414.00) & CARDIOMYOPATHY (ICD-425.4) - followed by DrHochrein for Cards> she is ACE intolerant w/ cough, and has long hx of non-compliance w/ med Rx. ~  abn EKG w/ LBBB... ~  2DEcho 3/10 showed diffuse LV HK w/ EF~25%, mild calcif AoV, mild MR, mod dil LA & atrial septal aneurysm. ~  NuclearStressTest 2/10 showed cardiomyop w/ diffuse HK & EF~35%. ~  Cath 11/15/08 w/ EF= 30-35%, & non-obstructive CAD w/ 30-40% midLAD, 40-70% Diag branch of LAD, calcif in proxCIRC, & 30% midRCA. ~  Repeat 2DECho 7/10 showed infer AK, septal dysynergy, atrial septal aneurym, EF= 30-35%... ~  Hosp 10/10 by Cards & r/o for MI, meds adjusted as noted. ~  4/12:  Hosp by [facility] w/ atyp CP & r/o for MI> disch for Cards f/u and any further out pt work up... ~  10/12:  She is overdue for f/u w/  DrHochrein & promises to call to set this up on her own...  PAROXYSMAL ATRIAL FIBRILLATION (ICD-427.31) - eval for palpit by DrHochrein w/ PAF on Holter> he ordered Coumadin restarted 2/11... she refused coumadin clinic, therefore protimes followed in [place] office.  HYPERCHOLESTEROLEMIA (ICD-272.0) - she refused meds in past & preferred Rx w/ red yeast rice, oatmeal, herbal remedies. ~  FLP 11/07 showed TChol 285, TG 155, HDL 80, LDL 165... obviously not taking med. ~  FLP 10/09 showed TChol 294, TG 205, HDL 78, LDL 157... she does not want to start statin Rx. ~  Discussed starting Rx in light of her cardiomyopathy & CAD on cath- she refuses statin Rx. ~  FLP 4/10 on diet showed TChol 211, TG 48, HDL 94, LDL 98... rec> keep up the great work! ~  FLP 2/11 on diet showed TChol 278, TG 303, HDL 74, LDL 154... rec> try PRAVACHOL40. ~  pt admits to taking the Prav40 "only now & again"> asked to take it daily. ~  FLP 1/12 showed TChol 198, TG 175, HDL 71, LDL 92  DIABETES MELLITUS (ICD-250.00) - long hx of chronic non-compliance w/ med Rx ~  labs 10/09 showed BS= 319, HgA1c= 11.8.Marland KitchenMarland Kitchen re-started Metformin 500mg -2Bid + Glimepiride 4mg /d, but she c/o abd pain on the Metformin and stopped the Glimepiride on her own... referred to Endocrine. ~  DrEllison opted for Rx w/ ACTOS 45mg /d, & GLIMEPIRIDE 1mg - 1/2 tabAM (intol Metformin). ~  labs 4/10 showed BS= 102, A1c= 6.9.Marland KitchenMarland Kitchen continue same. ~  7/10: regulated in hosp w/ GLUCOPHAGE 500- 1/2Bid + GLIMEPIRIDE 1mg /d... labs showed BS= 144, A1c= 7.4. ~  9/10: labs showed BS= 270, A1c= 7.3.Marland KitchenMarland Kitchen prev intol to Metform500, therefore incr GLIMEPIRIDE 2mg Qam (she refused). ~  11/10: Bs= 201, A1c= 8.4.Marland KitchenMarland Kitchen rec try to incr Metform500Bid & Glimep2mg /d... ~  labs 2/11 showed BS= 188, A1c= 9.0 (hasn't filled Metform in months & ?taking Glimep1mg ?). ~  labs 6/11 showed BS= 238, A1c=9.4 (Pharm confirms not taking meds regularly)... rec> take meds daily!  NOTE: Creat=1.2,  Umicroalb=pos. ~  followed by DrEllison every 3 months- he tried to add Januvia, she never filled the Rx. ~  labs 1/12 showed BS= 122, A1c= 8.5.Marland KitchenMarland Kitchen rec to take Metform500Bid, Glimep2mg /d, Januvia100mg /d. ~  4/12 Hosp showed BS= 111-310 & A1c= 7.6; disch back on Metform500Bid, Amaryl 2mg , ?Januvia100 (?if taking)... ~  Labs 10/12 showed BS= 206, A1c= 8.9 ?on Amaryl monotherapy> poor control & encouraged to f/u w/ DrEllison/ DM management.  GERD (ICD-530.81) - previously on herbal remedies w/ prev eval [facility] & DrNat-Mann in 1998...  ~  [facility] 6/10 w/ hematemesis & UGI bleed from DU +HPylori... eval by  GI & Rx w/ OMPEPRAZOLE 40mg  & PYLERA (broken down to the generic Bismuth, Metronidazole, Tetracycline- she is Penicillin allergic) ~  GI f/u DrStark 9/10- HPylori eradicated, continue Omep40mg /d...  DIVERTICULOSIS OF COLON (ICD-562.10)  COLON POLYPS  ~  last procedure= FlexSig 1998 by DrStark showing divertics only... 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  NEPHROLITHIASIS (ICD-592.0) ~  Her enal function is normal w/ Creat = 1.1 - 1.2 range...  DEGENERATIVE JOINT DISEASE (ICD-715.90) - uses VICODIN Prn... ~  10/12:  She noted fall w/ ?tendon damage in right knee & trated by DrCollins w/ Vicodin...  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 sudden HA- eval revealed AVM & conservative approach recommended by Neurosurgery w/ BP control... ~  She has been reminded on numerous occas about the importance of BP control, taking meds everyday to prevent recurrent ICH, stroke,  etc...  NEUROPATHY (ICD-355.9)  ANXIETY (ICD-300.00)  Past Surgical History  Procedure Date  . Knee surgery     left  . Shoulder surgery     right  . Cataract extraction     Outpatient Encounter Prescriptions as of 06/15/2011  Medication Sig Dispense Refill  . amLODipine (NORVASC) 5 MG tablet Take 1 tablet (5 mg total) by mouth daily.  30 tablet  6  . carvedilol (COREG) 12.5 MG tablet 1 and 1/2 tablets twice daily       . glimepiride (AMARYL) 2 MG tablet Take 2 mg by mouth every morning.        Marland Kitchen HYDROcodone-acetaminophen (LORCET) 10-650 MG per tablet Take 1 tablet by mouth every 6 (six) hours as needed.        Marland Kitchen losartan (COZAAR) 100 MG tablet TAKE 1 TABLET BY MOUTH ONCE DAILY...  30 tablet  3  . DISCONTD: carisoprodol (SOMA) 350 MG tablet Take 350 mg by mouth 3 (three) times daily as needed.        . pravastatin (PRAVACHOL) 40 MG tablet Take 40 mg by mouth daily.        . sitaGLIPtan (JANUVIA) 100 MG tablet Take 100 mg by mouth daily.        Marland Kitchen DISCONTD: HYDROcodone-acetaminophen (VICODIN) 5-500 MG per tablet Take 1 tablet by mouth every 6 (six) hours as needed.        Marland Kitchen DISCONTD: Multiple Vitamins-Minerals (MULTIVITAMIN WITH MINERALS) tablet Take 1 tablet by mouth daily.        Marland Kitchen DISCONTD: 0.9 %  sodium chloride infusion         Allergies  Allergen Reactions  . Lisinopril     REACTION: INTOL to ACE's w/ cough  . Penicillins     REACTION: rash    Current Medications, Allergies, Past Medical History, Past Surgical History, Family History, and Social History were reviewed in [employer] record.   Review of Systems         See HPI - all other systems neg except as noted... The patient complains of dyspnea on exertion.  The patient denies anorexia, fever, weight loss, weight gain, vision loss, decreased hearing, hoarseness, chest pain, syncope, peripheral edema, prolonged cough, headaches, hemoptysis, abdominal pain, melena, hematochezia, severe  indigestion/heartburn, hematuria, incontinence, muscle weakness, suspicious skin lesions, transient blindness, difficulty walking, depression, unusual weight change, abnormal bleeding, enlarged lymph nodes, and angioedema.     Objective:   Physical Exam      WD, WN, 77 y/o BF in NAD... GENERAL:  Alert & oriented; pleasant & cooperative... HEENT:  [place]/AT, EOM-wnl, PERRLA, EACs-clear, TMs-wnl, NOSE-clear, THROAT-clear & wnl. NECK:  Supple w/ fairROM; no JVD; normal carotid impulses w/o bruits; no thyromegaly but sm nodule palpated; no lymphadenopathy. CHEST:  Clear to P & A; without wheezes/ rales/ or rhonchi. HEART:  Regular Rhythm; gr 1/6 SEM, no rubs or gallops heard... ABDOMEN:  Soft & nontender; normal bowel sounds; no organomegaly or masses detected. EXT: without deformities, mild arthritic changes; no varicose veins/ venous insuffic/ or edema. NEURO:  CN's intact; motor testing normal; sensory testing normal; gait normal & balance OK. DERM:  No lesions noted; no rash etc...   Assessment & Plan:   CP, known CAD>  She was adm 4/12 w/ atypCP & ruled out, likely musculoskeletal, TH service consulted DrMcLean & contacted DrHochrein who indicated out pt work up & agreed w/ stopping her Coumadin; she is overdue for the f/u w/ Cards.  Hx PE in 2010>  She had neg CT Angio  4/12 hosp & her coumadin was stopped...  HBP>  Poorly controlled due to poor medication compliance- she is encouraged to take all meds everyday & fill her prescriptions every month; she is reminded to bring meds to each & every visit...  HxPAF>  She has maintained NSR and is followed by DrHochrein...  CHOL>  She refuses med Rx; stopped our prev Prav40 prescription; f/u FLP shows poor numbers & she is [facility] help.  DM>  Followed by DrEllison & her DM control is poor due to medication noncompliance & perceived side effects; she is rec to f/u w/ DrEllison ASAP to discuss options for treatment &  better control.  GI>  Stable on her herbal remedies that she likes so much...  HxAVM & TIA>  Aware, she denies cerebral ischemic symptoms; she is reminded of the critically important aspect of BP control to avoid ICH w/ her AVM.Marland KitchenMarland Kitchen

## 2011-07-26 ENCOUNTER — Other Ambulatory Visit: Payer: Self-pay | Admitting: Pulmonary Disease

## 2011-08-03 ENCOUNTER — Encounter: Payer: Self-pay | Admitting: Endocrinology

## 2011-08-03 ENCOUNTER — Ambulatory Visit (INDEPENDENT_AMBULATORY_CARE_PROVIDER_SITE_OTHER): Payer: Medicare Other | Admitting: Endocrinology

## 2011-08-03 DIAGNOSIS — E119 Type 2 diabetes mellitus without complications: Secondary | ICD-10-CM

## 2011-08-03 NOTE — Progress Notes (Signed)
Subjective:    Patient ID: [name], female    DOB: 1927/12/07, 77 y.o.   MRN: [id number]  HPI Pt returns for f/u of type 2 DM (1992).  Oral rx has been limited by renal insufficiency.  She says she really does not want to take insulin.  She refuses to go back to the actos.  She does not even want to add welchol or parlodel.   Past Medical History  Diagnosis Date  . Pulmonary embolism   . Hypertension   . CAD (coronary artery disease)   . Cardiomyopathy   . Palpitation   . Paroxysmal atrial fibrillation   . Hypercholesterolemia   . Diabetes mellitus   . Nontoxic multinodular goiter   . Pure hypercholesterolemia   . Peptic ulcer, unspecified site, unspecified as acute or chronic, without mention of hemorrhage, perforation, or obstruction   . GERD (gastroesophageal reflux disease)   . Benign neoplasm of adrenal gland   . Diverticulosis of colon (without mention of hemorrhage)   . Calculus of kidney   . 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 DJD (degenerative joint disease)   . Unspecified transient cerebral ischemia   . Anxiety   . Neuropathy   . Unspecified transient cerebral ischemia   . H. pylori infection     Past Surgical History  Procedure Date  . Knee surgery     left  . Shoulder surgery     right  . Cataract extraction     History   Social History  . Marital Status: Widowed    Spouse Name: N/A    Number of Children: 1  . Years of Education: N/A   Occupational History  . retired    Social History Main Topics  . Smoking status: Former Smoker    Types: Cigarettes    Quit date: 08/18/1983  . Smokeless tobacco: Never Used  . Alcohol Use: Yes     occ wine, beer during holidays or at social events  . Drug Use: No  . Sexually Active: Not on file   Other Topics Concern  . Not on file   Social History Narrative  . No narrative on file    Current Outpatient Prescriptions on File Prior to Visit  Medication  Sig Dispense Refill  . amLODipine (NORVASC) 5 MG tablet Take 1 tablet (5 mg total) by mouth daily.  30 tablet  6  . carvedilol (COREG) 12.5 MG tablet 1 and 1/2 tablets twice daily       . glimepiride (AMARYL) 2 MG tablet Take 2 mg by mouth every morning.        Marland Kitchen HYDROcodone-acetaminophen (LORCET) 10-650 MG per tablet Take 1 tablet by mouth every 6 (six) hours as needed.        Marland Kitchen losartan (COZAAR) 100 MG tablet TAKE 1 TABLET BY MOUTH ONCE DAILY...  30 tablet  3  . pravastatin (PRAVACHOL) 40 MG tablet Take 40 mg by mouth daily.        . sitaGLIPtan (JANUVIA) 100 MG tablet Take 100 mg by mouth daily.          Allergies  Allergen Reactions  . Lisinopril     REACTION: INTOL to ACE's w/ cough  . Penicillins     REACTION: rash    Family History  Problem Relation Age of Onset  . Diabetes Maternal Grandmother   . Heart disease Maternal Aunt     x2  . Lung cancer Mother   .  Brain cancer Mother   . Goiter Mother   . Colon cancer Neg Hx     BP 130/82  Pulse 60  Temp(Src) 97.5 F (36.4 C) (Oral)  Ht 5' 3.5" (1.613 m)  Wt 132 lb 8 oz (60.102 kg)  BMI 23.10 kg/m2  SpO2 97%    Review of Systems denies hypoglycemia    Objective:   Physical Exam VITAL SIGNS:  See vs page GENERAL: no distress Pulses: dorsalis pedis intact bilat.   Feet: no deformity.  no ulcer on the feet.  feet are of normal color and temp.  no edema Neuro: sensation is intact to touch on the feet.     Lab Results  Component Value Date   HGBA1C 8.9* 06/15/2011      Assessment & Plan:  DM, therapy limited by noncompliance with advice to take insulin, or at least additional orals.  i'll do the best i can.  i demonstrated insulin injection to her abdomen.  She says she'll consider.

## 2011-08-03 NOTE — Patient Instructions (Addendum)
pending the test results, please continue the same medications for now. Please make a follow-up appointment in 2 months.  check your blood sugar 1 time a day.  vary the time of day when you check, between before the 3 meals, and at bedtime.  also check if you have symptoms of your blood sugar being too high or too low.  please keep a record of the readings and bring it to your next appointment here.  please call us sooner if you are having low blood sugar episodes.   (update: i left message on phone-tree:  It is dangerous to not take insulin).

## 2011-10-05 ENCOUNTER — Ambulatory Visit: Payer: Medicare Other | Admitting: Endocrinology

## 2011-10-06 ENCOUNTER — Ambulatory Visit: Payer: Medicare Other | Admitting: Endocrinology

## 2011-10-15 ENCOUNTER — Other Ambulatory Visit: Payer: Self-pay | Admitting: Pulmonary Disease

## 2011-10-19 ENCOUNTER — Encounter: Payer: Self-pay | Admitting: Endocrinology

## 2011-10-19 ENCOUNTER — Other Ambulatory Visit (INDEPENDENT_AMBULATORY_CARE_PROVIDER_SITE_OTHER): Payer: Medicare Other

## 2011-10-19 ENCOUNTER — Ambulatory Visit (INDEPENDENT_AMBULATORY_CARE_PROVIDER_SITE_OTHER): Payer: Medicare Other | Admitting: Endocrinology

## 2011-10-19 DIAGNOSIS — E042 Nontoxic multinodular goiter: Secondary | ICD-10-CM

## 2011-10-19 DIAGNOSIS — E119 Type 2 diabetes mellitus without complications: Secondary | ICD-10-CM

## 2011-10-19 DIAGNOSIS — N058 Unspecified nephritic syndrome with other morphologic changes: Secondary | ICD-10-CM

## 2011-10-19 DIAGNOSIS — E1129 Type 2 diabetes mellitus with other diabetic kidney complication: Secondary | ICD-10-CM

## 2011-10-19 DIAGNOSIS — E1165 Type 2 diabetes mellitus with hyperglycemia: Secondary | ICD-10-CM

## 2011-10-19 NOTE — Patient Instructions (Addendum)
It is extremely important to take medications as prescribed. Please make a follow-up appointment in 3 months.  check your blood sugar 1 time a day.  vary the time of day when you check, between before the 3 meals, and at bedtime.  also check if you have symptoms of your blood sugar being too high or too low.  please keep a record of the readings and bring it to your next appointment here.  please call us sooner if you are having low blood sugar episodes.   good diet and exercise habits significanly improve the control of your diabetes.  please let me know if you wish to be referred to a dietician.  high blood sugar is very risky to your health.  you should see an eye doctor every year. controlling your blood pressure and cholesterol drastically reduces the damage diabetes does to your body.  this also applies to quitting smoking.  please discuss these with your doctor.  you should take an aspirin every day, unless you have been advised by a doctor not to. (update: i left message on phone-tree:  You need insulin).

## 2011-10-19 NOTE — Progress Notes (Signed)
Subjective:    Patient ID: [name], female    DOB: 06-04-1928, 78 y.o.   MRN: [id number]  HPI Pt returns for f/u of type 2 DM (1992).  Oral rx has been limited by renal insufficiency.  She says she really does not want to take insulin.  She refuses to go back to the actos.  She does not even want to add welchol or parlodel.  She says she misses her meds 1-2 days/week.  no cbg record, but states cbg's vary from 70-180.  Past Medical History  Diagnosis Date  . Pulmonary embolism   . Hypertension   . CAD (coronary artery disease)   . Cardiomyopathy   . Palpitation   . Paroxysmal atrial fibrillation   . Hypercholesterolemia   . Diabetes mellitus   . Nontoxic multinodular goiter   . Pure hypercholesterolemia   . Peptic ulcer, unspecified site, unspecified as acute or chronic, without mention of hemorrhage, perforation, or obstruction   . GERD (gastroesophageal reflux disease)   . Benign neoplasm of adrenal gland   . Diverticulosis of colon (without mention of hemorrhage)   . Calculus of kidney   . 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 DJD (degenerative joint disease)   . Unspecified transient cerebral ischemia   . Anxiety   . Neuropathy   . Unspecified transient cerebral ischemia   . H. pylori infection     Past Surgical History  Procedure Date  . Knee surgery     left  . Shoulder surgery     right  . Cataract extraction     History   Social History  . Marital Status: Widowed    Spouse Name: N/A    Number of Children: 1  . Years of Education: N/A   Occupational History  . retired    Social History Main Topics  . Smoking status: Former Smoker    Types: Cigarettes    Quit date: 08/18/1983  . Smokeless tobacco: Never Used  . Alcohol Use: Yes     occ wine, beer during holidays or at social events  . Drug Use: No  . Sexually Active: Not on file   Other Topics Concern  . Not on file   Social History Narrative  . No  narrative on file    Current Outpatient Prescriptions on File Prior to Visit  Medication Sig Dispense Refill  . amLODipine (NORVASC) 5 MG tablet Take 1 tablet (5 mg total) by mouth daily.  30 tablet  6  . carvedilol (COREG) 12.5 MG tablet 1 and 1/2 tablets twice daily       . glimepiride (AMARYL) 2 MG tablet Take 2 mg by mouth every morning.        Marland Kitchen HYDROcodone-acetaminophen (LORCET) 10-650 MG per tablet Take 1 tablet by mouth every 6 (six) hours as needed.        Marland Kitchen losartan (COZAAR) 100 MG tablet TAKE 1 TABLET BY MOUTH ONCE DAILY...  30 tablet  3  . PRECISION XTRA TEST STRIPS test strip TEST BLOOD SUGAR ONCE DAILY AS DIRECTED  100 each  5  . pravastatin (PRAVACHOL) 40 MG tablet Take 40 mg by mouth daily.        . sitaGLIPtan (JANUVIA) 100 MG tablet Take 100 mg by mouth daily.          Allergies  Allergen Reactions  . Lisinopril     REACTION: INTOL to ACE's w/ cough  . Penicillins  REACTION: rash    Family History  Problem Relation Age of Onset  . Diabetes Maternal Grandmother   . Heart disease Maternal Aunt     x2  . Lung cancer Mother   . Brain cancer Mother   . Goiter Mother   . Colon cancer Neg Hx     BP 150/82  Pulse 66  Temp(Src) 97.4 F (36.3 C) (Oral)  Ht 5\' 3"  (1.6 m)  Wt 130 lb (58.968 kg)  BMI 23.03 kg/m2  SpO2 97%    Review of Systems denies hypoglycemia.      Objective:   Physical Exam VITAL SIGNS:  See vs page GENERAL: no distress Neck: there is an easily-palpable, 1.5 cm left thyroid nodule.   Lab Results  Component Value Date   HGBA1C 9.4* 10/19/2011      Assessment & Plan:  DM, therapy limited by severe noncompliance.  This is very risky to her health.   i'll do the best i can. Multinodular goiter, unchanged.

## 2011-11-07 ENCOUNTER — Other Ambulatory Visit: Payer: Self-pay | Admitting: Pulmonary Disease

## 2011-12-01 ENCOUNTER — Ambulatory Visit (INDEPENDENT_AMBULATORY_CARE_PROVIDER_SITE_OTHER): Payer: Medicare Other | Admitting: Pulmonary Disease

## 2011-12-01 ENCOUNTER — Encounter: Payer: Self-pay | Admitting: Pulmonary Disease

## 2011-12-01 VITALS — BP 142/80 | HR 65 | Temp 97.1°F | Ht 62.0 in | Wt 128.8 lb

## 2011-12-01 DIAGNOSIS — I4891 Unspecified atrial fibrillation: Secondary | ICD-10-CM

## 2011-12-01 DIAGNOSIS — E1129 Type 2 diabetes mellitus with other diabetic kidney complication: Secondary | ICD-10-CM

## 2011-12-01 DIAGNOSIS — N058 Unspecified nephritic syndrome with other morphologic changes: Secondary | ICD-10-CM

## 2011-12-01 DIAGNOSIS — M1990 Unspecified osteoarthritis, unspecified site: Secondary | ICD-10-CM

## 2011-12-01 DIAGNOSIS — E7800 Pure hypercholesterolemia, unspecified: Secondary | ICD-10-CM

## 2011-12-01 DIAGNOSIS — I2510 Atherosclerotic heart disease of native coronary artery without angina pectoris: Secondary | ICD-10-CM

## 2011-12-01 DIAGNOSIS — I428 Other cardiomyopathies: Secondary | ICD-10-CM

## 2011-12-01 DIAGNOSIS — F411 Generalized anxiety disorder: Secondary | ICD-10-CM

## 2011-12-01 DIAGNOSIS — E1165 Type 2 diabetes mellitus with hyperglycemia: Secondary | ICD-10-CM

## 2011-12-01 DIAGNOSIS — K219 Gastro-esophageal reflux disease without esophagitis: Secondary | ICD-10-CM

## 2011-12-01 DIAGNOSIS — K5730 Diverticulosis of large intestine without perforation or abscess without bleeding: Secondary | ICD-10-CM

## 2011-12-01 DIAGNOSIS — I10 Essential (primary) hypertension: Secondary | ICD-10-CM

## 2011-12-01 DIAGNOSIS — N259 Disorder resulting from impaired renal tubular function, unspecified: Secondary | ICD-10-CM

## 2011-12-01 NOTE — Progress Notes (Signed)
Subjective:    Patient ID: [name], female    DOB: 1928/06/04, 78 y.o.   MRN: [id number]  HPI 78 y/o BF here for a follow up visit... he has multiple medical problems as noted below...    ~  May 05, 2010:  once again [name] did not bring meds to visit or list- "same thing, you have it right there" & again asked to bring all meds to every visit... BP controlled when [name] takes meds regularly;  admits to only taking the Prav40 "now & again";  not checking BS at home &[name] wants to know "why can't I just incr my intake of cinnamon, garlic, and beets?"... [name] saw DrEllison 6/11- note reviewed "therapy limited by noncompliance".  ~  September 02, 2010:  [name] has mult somatic complaints... Coumadin recently decr to 1/2 tab x 7d & continues monitoring thru [place] office protocol... [name] saw DrEllison 12/11- states he reviewed 3 on-going medical problems, added Januvia 100mg /d but pt never got the message, & never picked up the medication... [name] saw DrHochrein 11/11 & he up-titrated her Coreg to 18.75mg  Bid (12.5mg  tabs- 1.5tabs Bid) & tol well... BP control remains fair> ?what [name]'s actually taking "my home remedy of vinegar & garlic is helping my BP" [name] says... NOTE> labs today showed BS=122, A1c=8.5, & rec to take Metformin/ Glimep as perscribed & add the Januvia 100mg /d.  ~  December 12, 2010:  48mo ROV & Post Hosp check> since I saw her last [name] was [place] by [facility] (atypCP) & had OV f/u w/ DrHochrein & DrEllison> now off Coumadin rx... States [name] is doing fine & glad to be off the Coumadin; [name] did not bring med bottles & doesn't know her meds, [name] says they are "the same" > reviewed w/ pt the best I could (see meds below) & [name] is asked to bring all meds to every visit...  ~  June 15, 2011:  65mo ROV> [name] tells me that [name] fell about a month ago & "pulled the tendons in my knee" per DrACollins, treated w/ Hydrocodone 10mg  per Ortho;  BP appears poorly controlled largely due to poor med compliance- [name]  has once again failed to bring her med bottle or her med list to the OV & [name] is encouraged to bring all bottle toevery doctor visit for review;  In addition [name] has stopped her statin med (Prev40) & her FLP today shows [id number] & LDL 170> we will refer to the [facility] for help;  [name] is overdue for a follow up visit w/ Cards & will call DrHochrein's office for appt; See prob list below:    >[name] saw DrEllison 9/12 & her A1c= 9.6 on Amaryl monotherapy> he felt her poor control was due to noncompliance ([name] states intol to Metformin, won't take Actos "due to my heart" & stopped Januvia on her own);  [name] requested that we do labs and Bs=206, A1c=8.9 & [name] is asked to f/u w/ her Diabetic specialist to discuss the options left open to her for better control of her DM...    >[name] saw DrStark 5/12 for sm vol hematochezia, known divertics on CT Abd in 2006, hx prev FlexSig so he performed colonoscopy 5/12 w/mod divertics in sigmoid region & 2 polyps removed (20mm & 12mm), path= tubulovillous adenomas & repeat colon planned ?92yrs per DrStark...  ~  December 01, 2011:  5-48mo ROV & [name] will be 81 in a few days; [name] has been doing well [name] says "just the  nusual aches & pains" but [name] notes that [name] "fell over the dog" w/ torn lig in knee> went to ER & f/u w/ DrACarter- given brace & conservative Rx, improved;  [name] tells me [name] is "tired of medications" & this is a bad sign usually meaning that [name] has stopped several meds etc (eg- [name] is not taking the Prav40);  BP appears ok on her 3 meds below & [name] denies CP, palpit, SOB, edema, etc;  DM regulated by DrEllison for Endocrine on 2 meds below but recent A1c= 9.4 & his note of 3/13 is reviewed...  See prob list below>>   Problem List:    << PROBLEM LIST UPDATED 12/01/11 >>  Hx of PULMONARY EMBOLISM (ICD-415.19) - presented w/ pleuritic right CP 7/10 w/ CT angio showing pulm emboli & Rx w/ hep> coumadin... protimes were followed w/ the [facility] office protocol... ~  hosp by Cards  9/10 for CP- [name] ruled out for MI & had f/u CT Angio- NEG for emboli (prev emboli resolved), & VenDopplers- NEG for DVT... ~  Protime 11/10= 49.4/ INR 4.8 (on Coumadin 5mg /d) & [name]'s hi risk for bleed w/ hx DU & AVM- therefore coumadin stopped. 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 ~  4/12:  Coumadin stopped during the 4/12 hospitalization by [facility] in consult w/ Cards- DrHochrein (Disch summary reviewed).  HYPERTENSION (ICD-401.9) - long hx of signif HBP w/ hosp for [facility] in 2001 by DrKritzer (due to an AVM)> on COREG 18.75mg  Bid (12.5mg Tabs- taking 1.5 tabsBid), COZAAR 100mg /d, & AMLODIPINE 5mg /d;  previously on Toprol XL & Lotrel- developed an ACE cough; long hx non-compliance w/ med Rx- [name] prefers garlic and lemon juice!!!  ~  4/10:  BP= 160/90 today, pt supposed to be on Bisoprolol 2.5mg  daily, and add Losartan 100mg /d. ~  7/10:  BP= 140/80 on Ziac2.5 + Cozaar100... reminded to take meds every day!!! ~  9/10:  BP= 132/82 on Ziac2.5 + Cozaar 100... continue same! ~  10/10: Hosp by Cards and meds changed- COREG 3.125Bid + COZAAR100- 1/2 tab/d...  ~  11/10:  BP today= 146/90 & states [name] doesn't feel well- rec incr Cozaar back to 100mg /d. ~  2/11:  BP= 154/90 & supposed to be on Coreg 6.25Bid & Losartan 100mg /d- Coreg incr to 12.5Bid. ~  9/11:  BP= 150/90 & pt reminded to take meds every day! ~  11/11:  DrHochrein up-titrated Coreg to 18.75mg  Bid, +Cozaar 100mg /d, BP= 142/84> ?compliance. ~  4/12:  BP= 132/76 & stable, continue same meds & remember to bring all meds to the office visits... ~  10/12:  BP= 160/88 & I suspect noncompliance w/ meds; encouraged to take all meds every day & bring bottles to every visit. ~  4/13:  BP= 142/80 & [name] remains asymptomatic; denies CP, palpit, SOB, edema, etc...  CORONARY ARTERY DISEASE (ICD-414.00) &  CARDIOMYOPATHY (ICD-425.4) - followed by DrHochrein for Cards> [name] is ACE intolerant w/ cough, and has long hx of non-compliance w/ med Rx. ~  abn EKG w/ LBBB... ~  2DEcho 3/10 showed diffuse LV HK w/ EF~25%, mild calcif AoV, mild MR, mod dil LA & atrial septal aneurysm. ~  NuclearStressTest 2/10 showed cardiomyop w/ diffuse HK & EF~35%. ~  Cath 11/15/08 w/ EF= 30-35%, & non-obstructive CAD w/ 30-40% midLAD, 40-70% Diag  branch of LAD, calcif in proxCIRC, & 30% midRCA. ~  Repeat 2DECho 7/10 showed infer AK, septal dysynergy, atrial septal aneurym, EF= 30-35%... ~  Hosp 10/10 by Cards & r/o for MI, meds adjusted as noted. ~  4/12:  Hosp by [facility] w/ atyp CP & r/o for MI> disch for Cards f/u and any further out pt work up... ~  10/12 & 4/13:  [name] is overdue for f/u w/ DrHochrein & promises to call to set this up on her own ([name] declined my offer to sched for her).  PAROXYSMAL ATRIAL FIBRILLATION (ICD-427.31) - eval for palpit by DrHochrein w/ PAF on Holter> he ordered Coumadin restarted 2/11 ==> it was discontinued again 4/12 Hosp...  HYPERCHOLESTEROLEMIA (ICD-272.0) - [name] refused meds in past & preferred Rx w/ red yeast rice, oatmeal, herbal remedies. ~  FLP 11/07 showed TChol 285, TG 155, HDL 80, LDL 165... obviously not taking med. ~  FLP 10/09 showed TChol 294, TG 205, HDL 78, LDL 157... [name] does not want to start statin Rx. ~  Discussed starting Rx in light of her cardiomyopathy & CAD on cath- [name] refuses statin Rx. ~  FLP 4/10 on diet showed TChol 211, TG 48, HDL 94, LDL 98... rec> keep up the great work! ~  FLP 2/11 on diet showed TChol 278, TG 303, HDL 74, LDL 154... rec> try PRAVACHOL40. ~  pt admits to taking the Prav40 "only now & again"> asked to take it daily. ~  FLP 1/12 on Prav40 showed TChol 198, TG 175, HDL 71, LDL 92 ~  FLP 10/12 off meds showed TChol 273, TG 151, HDL 79, LDL 170 (goal<70) & [name] was referred to Lipid Clinic. ~  11/12: [name] established w/ the Lipid Clinic & told them the  only med [name] took regularly was the Coreg; diet noted to be poor, & counseled appropriately, asked to restart PRAV40- MWF but [name] never followed up w/ them... ~  4/13:  Pt advised on diet, exercise, & asked to restart PRAV40 as requested by Lipid Clinic; [name] was asked to sched f/u in the clinic to help lipid management...   DIABETES MELLITUS (ICD-250.00) - long hx of chronic non-compliance w/ med Rx ~  labs 10/09 showed BS= 319, HgA1c= 11.8.Marland KitchenMarland Kitchen re-started Metformin 500mg -2Bid + Glimepiride 4mg /d, but [name] c/o abd pain on the Metformin and stopped the Glimepiride on her own... referred to Endocrine. ~  DrEllison opted for Rx w/ ACTOS 45mg /d, & GLIMEPIRIDE 1mg - 1/2 tabAM (intol Metformin). ~  labs 4/10 showed BS= 102, A1c= 6.9.Marland KitchenMarland Kitchen continue same. ~  7/10: regulated in hosp w/ GLUCOPHAGE 500- 1/2Bid + GLIMEPIRIDE 1mg /d... labs showed BS= 144, A1c= 7.4. ~  9/10: labs showed BS= 270, A1c= 7.3.Marland KitchenMarland Kitchen prev intol to Metform500, therefore incr GLIMEPIRIDE 2mg Qam ([name] refused). ~  11/10: Bs= 201, A1c= 8.4.Marland KitchenMarland Kitchen rec try to incr Metform500Bid & Glimep2mg /d... ~  labs 2/11 showed BS= 188, A1c= 9.0 (hasn't filled Metform in months & ?taking Glimep1mg ?). ~  labs 6/11 showed BS= 238, A1c=9.4 (Pharm confirms not taking meds regularly)... rec> take meds daily!  NOTE: Creat=1.2, Umicroalb=pos. ~  followed by DrEllison every 3 months- he tried to add Januvia, [name] never filled the Rx. ~  labs 1/12 showed BS= 122, A1c= 8.5.Marland KitchenMarland Kitchen rec to take Metform500Bid, Glimep2mg /d, Januvia100mg /d. ~  4/12 Hosp showed BS= 111-310 & A1c= 7.6; disch back on Metform500Bid, Amaryl 2mg , ?Januvia100 (?if taking)... ~  Labs 10/12 showed BS= 206, A1c= 8.9 ?on Amaryl monotherapy> poor control & encouraged to f/u  w/ DrEllison/ DM management. ~  4/13:  We reviewed her DM mamagement under the direction of DrEllison; recent A1c=9.4 & compliance w/ diet & meds is the major issue> discussed w/ pt.  GERD (ICD-530.81) - previously on herbal remedies w/ prev eval  from [facility] & DrNat-Mann in 1998...  ~  [facility] 6/10 w/ hematemesis & UGI bleed from DU +HPylori... eval by [facility] & Rx w/ OMPEPRAZOLE 40mg  & PYLERA (broken down to the generic Bismuth, Metronidazole, Tetracycline- [name] is Penicillin allergic) ~  GI f/u DrStark 9/10- HPylori eradicated, continue Omep40mg /d...  DIVERTICULOSIS OF COLON (ICD-562.10)  COLON POLYPS  ~  last procedure= FlexSig 1998 by DrStark showing divertics only... 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  NEPHROLITHIASIS (ICD-592.0) ~  Her renal function is normal w/ Creat = 1.1 - 1.2 range...  DEGENERATIVE JOINT DISEASE (ICD-715.90) - uses VICODIN Prn... ~  10/12:  [name] noted fall w/ ?ligament damage in right knee & treated by DrACarter w/ Vicodin...  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 sudden HA- eval revealed AVM & conservative approach recommended by Neurosurgery w/ BP control... ~  [name] has been reminded on numerous occas about the importance of BP control, taking meds everyday to prevent recurrent ICH, stroke, etc...  NEUROPATHY (ICD-355.9)  ANXIETY (ICD-300.00)   Past Surgical History  Procedure Date  . Knee surgery     left  . Shoulder surgery     right  . Cataract extraction     Outpatient Encounter Prescriptions as of 12/01/2011  Medication Sig Dispense Refill  . amLODipine (NORVASC) 5 MG tablet Take 1 tablet (5 mg total) by mouth daily.  30 tablet  6  . carvedilol (COREG) 12.5 MG tablet 1 and 1/2 tablets twice daily       . glimepiride (AMARYL) 2 MG tablet Take 2 mg by mouth every morning.         Marland Kitchen HYDROcodone-acetaminophen (LORCET) 10-650 MG per tablet Take 1 tablet by mouth every 6 (six) hours as needed.        Marland Kitchen losartan (COZAAR) 100 MG tablet TAKE 1 TABLET BY MOUTH ONCE DAILY...  30 tablet  3  . PRECISION XTRA TEST STRIPS test strip TEST BLOOD SUGAR ONCE DAILY AS DIRECTED  100 each  5  . sitaGLIPtan (JANUVIA) 100 MG tablet Take 100 mg by mouth daily.        Marland Kitchen DISCONTD: amLODipine (NORVASC) 5 MG tablet TAKE 1 TABLET BY MOUTH EVERY DAY  30 tablet  0  . DISCONTD: pravastatin (PRAVACHOL) 40 MG tablet Take 40 mg by mouth daily.          Allergies  Allergen Reactions  . Lisinopril     REACTION: INTOL to ACE's w/ cough  . Penicillins     REACTION: rash    Current Medications, Allergies, Past Medical History, Past Surgical History, Family History, and Social History were reviewed in [employer] record.   Review of Systems         See HPI - all other systems neg except as noted... The patient complains of dyspnea on exertion.  The patient denies anorexia, fever, weight loss, weight gain, vision loss, decreased hearing, hoarseness, chest pain, syncope, peripheral edema, prolonged cough, headaches, hemoptysis, abdominal pain, melena, hematochezia, severe indigestion/heartburn, hematuria, incontinence, muscle weakness, suspicious skin lesions, transient blindness, difficulty walking, depression, unusual weight change, abnormal bleeding, enlarged lymph nodes, and angioedema.     Objective:   Physical Exam      WD, WN, 78 y/o BF in NAD... GENERAL:  Alert & oriented; pleasant & cooperative... HEENT:  [place]ard/AT, EOM-wnl, PERRLA, EACs-clear, TMs-wnl, NOSE-clear, THROAT-clear & wnl. NECK:  Supple w/ fairROM; no JVD; normal carotid impulses w/o bruits; no thyromegaly but sm nodule palpated; no lymphadenopathy. CHEST:  Clear to P & A; without wheezes/ rales/ or rhonchi. HEART:  Regular Rhythm; gr 1/6 SEM, no rubs or gallops heard... ABDOMEN:  Soft & nontender; normal  bowel sounds; no organomegaly or masses detected. EXT: without deformities, mild arthritic changes; no varicose veins/ venous insuffic/ or edema. NEURO:  CN's intact; motor testing normal; sensory testing normal; gait normal & balance OK. DERM:  No lesions noted; no rash etc...  RADIOLOGY DATA:  Reviewed in the EPIC EMR & discussed w/ the patient...  LABORATORY DATA:  Reviewed in the EPIC EMR & discussed w/ the patient...   Assessment & Plan:   CP, known CAD>  [name] was adm 4/12 w/ atypCP & ruled out, likely musculoskeletal, TH service consulted DrMcLean & contacted DrHochrein who indicated out-pt work up & agreed w/ stopping her Coumadin; [name] is overdue for the f/u w/ Cards.  Hx PE in 2010>  [name] had neg CT Angio 4/12 hosp & her coumadin was stopped...  HBP>  Poorly controlled due to poor medication compliance- [name] is encouraged to take all meds everyday & fill her prescriptions every month; [name] is reminded to bring meds to each & every visit...  HxPAF>  [name] has maintained NSR and is followed by DrHochrein...  CHOL>  [name] refuses med Rx; stopped our prev Prav40 prescription; f/u FLP shows poor numbers & [name] is referred to the Lipid Clinic for their help.  DM>  Followed by DrEllison & her DM control is poor due to medication noncompliance & perceived side effects; [name] is rec to f/u w/ DrEllison ASAP to discuss options for treatment & better control.  GI>  Stable on her herbal remedies that [name] likes so much...  HxAVM & TIA>  Aware, [name] denies cerebral ischemic symptoms; [name] is reminded of the critically important aspect of BP control to avoid ICH w/ her AVM.Marland KitchenMarland Kitchen

## 2011-12-01 NOTE — Patient Instructions (Signed)
Today we updated your med list in our EPIC system...    Continue your current medications the same...  We reviewed your latest blood work & sugar numbers...    Be sure to stay on your low carb diet & continue your exercise program...    Take the DM meds as directed by DrEllison...  Call for any questions...  Let's plan a follow up visit in 6 months.Marland KitchenMarland Kitchen

## 2012-01-02 ENCOUNTER — Other Ambulatory Visit: Payer: Self-pay | Admitting: Pulmonary Disease

## 2012-01-07 ENCOUNTER — Other Ambulatory Visit: Payer: Self-pay | Admitting: Endocrinology

## 2012-01-18 ENCOUNTER — Encounter: Payer: Self-pay | Admitting: Endocrinology

## 2012-01-18 ENCOUNTER — Ambulatory Visit (INDEPENDENT_AMBULATORY_CARE_PROVIDER_SITE_OTHER): Payer: Medicare Other | Admitting: Endocrinology

## 2012-01-18 VITALS — BP 142/88 | HR 64 | Temp 97.7°F | Ht 63.0 in | Wt 128.0 lb

## 2012-01-18 DIAGNOSIS — E1129 Type 2 diabetes mellitus with other diabetic kidney complication: Secondary | ICD-10-CM

## 2012-01-18 DIAGNOSIS — E1165 Type 2 diabetes mellitus with hyperglycemia: Secondary | ICD-10-CM

## 2012-01-18 DIAGNOSIS — N058 Unspecified nephritic syndrome with other morphologic changes: Secondary | ICD-10-CM

## 2012-01-18 MED ORDER — BROMOCRIPTINE MESYLATE 2.5 MG PO TABS: 2.5000 mg | ORAL_TABLET | Freq: Every day | ORAL | Status: DC

## 2012-01-18 NOTE — Patient Instructions (Addendum)
Please make a follow-up appointment in 3 months.  check your blood sugar 1 time a day.  vary the time of day when you check, between before the 3 meals, and at bedtime.  also check if you have symptoms of your blood sugar being too high or too low.  please keep a record of the readings and bring it to your next appointment here.  please call us sooner if you are having low blood sugar episodes.   Please add-on "bromocriptine," to help your blood sugar. It has possible side effects of nausea and dizziness.  These go away with time.  You can avoid these by taking it at bedtime, and by taking just take 1/2 pill for the first week.

## 2012-01-18 NOTE — Progress Notes (Signed)
Subjective:    Patient ID: [name], female    DOB: 04-18-1928, 78 y.o.   MRN: [id number]  HPI Pt returns for f/u of type 2 DM (dx'ed 1992; complicated by TIA, CAD, peripheral sensory neuropathy, and renal insufficiency).  Oral rx has been limited by renal insufficiency.  She says she really does not want to take insulin.  She does not even want to add other oral meds.  She says she sometimes misses her meds.   Past Medical History  Diagnosis Date  . Pulmonary embolism   . Hypertension   . CAD (coronary artery disease)   . Cardiomyopathy   . Palpitation   . Paroxysmal atrial fibrillation   . Hypercholesterolemia   . Diabetes mellitus   . Nontoxic multinodular goiter   . Pure hypercholesterolemia   . Peptic ulcer, unspecified site, unspecified as acute or chronic, without mention of hemorrhage, perforation, or obstruction   . GERD (gastroesophageal reflux disease)   . Benign neoplasm of adrenal gland   . Diverticulosis of colon (without mention of hemorrhage)   . Calculus of kidney   . 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 DJD (degenerative joint disease)   . Unspecified transient cerebral ischemia   . Anxiety   . Neuropathy   . Unspecified transient cerebral ischemia   . H. pylori infection     Past Surgical History  Procedure Date  . Knee surgery     left  . Shoulder surgery     right  . Cataract extraction     History   Social History  . Marital Status: Widowed    Spouse Name: N/A    Number of Children: 1  . Years of Education: N/A   Occupational History  . retired    Social History Main Topics  . Smoking status: Former Smoker    Types: Cigarettes    Quit date: 08/18/1983  . Smokeless tobacco: Never Used  . Alcohol Use: Yes     occ wine, beer during holidays or at social events  . Drug Use: No  . Sexually Active: Not on file   Other Topics Concern  . Not on file   Social History Narrative  . No  narrative on file    Current Outpatient Prescriptions on File Prior to Visit  Medication Sig Dispense Refill  . amLODipine (NORVASC) 5 MG tablet TAKE 1 TABLET BY MOUTH EVERY DAY  30 tablet  0  . carvedilol (COREG) 12.5 MG tablet 1 and 1/2 tablets twice daily       . glimepiride (AMARYL) 2 MG tablet TAKE 1 TABLET BY MOUTH EVERY MORNING  30 tablet  6  . HYDROcodone-acetaminophen (LORCET) 10-650 MG per tablet Take 1 tablet by mouth every 6 (six) hours as needed.        Marland Kitchen losartan (COZAAR) 100 MG tablet TAKE 1 TABLET BY MOUTH ONCE DAILY...  30 tablet  3  . PRECISION XTRA TEST STRIPS test strip TEST BLOOD SUGAR ONCE DAILY AS DIRECTED  100 each  5  . sitaGLIPtan (JANUVIA) 100 MG tablet Take 100 mg by mouth daily.        . bromocriptine (PARLODEL) 2.5 MG tablet Take 1 tablet (2.5 mg total) by mouth daily.  30 tablet  11    Allergies  Allergen Reactions  . Lisinopril     REACTION: INTOL to ACE's w/ cough  . Penicillins     REACTION: rash    Family History  Problem Relation Age of Onset  . Diabetes Maternal Grandmother   . Heart disease Maternal Aunt     x2  . Lung cancer Mother   . Brain cancer Mother   . Goiter Mother   . Colon cancer Neg Hx     BP 142/88  Pulse 64  Temp(Src) 97.7 F (36.5 C) (Oral)  Ht 5\' 3"  (1.6 m)  Wt 128 lb (58.06 kg)  BMI 22.67 kg/m2  SpO2 97%   Review of Systems denies hypoglycemia    Objective:   Physical Exam Pulses: dorsalis pedis intact bilat.   Feet: no deformity.  no ulcer on the feet.  feet are of normal color and temp.  no edema Neuro: sensation is intact to touch on the feet   Lab Results  Component Value Date   HGBA1C 9.4* 10/19/2011      Assessment & Plan:  DM. Therapy severely limited by noncompliance.  i'll do the best i can.  After discussion, she agrees to add parlodel, although this addition is very unlikely to get a1c to goal.

## 2012-02-05 ENCOUNTER — Telehealth: Payer: Self-pay | Admitting: Endocrinology

## 2012-02-05 NOTE — Telephone Encounter (Signed)
Caller: Samon/Patient; PCP: Romero Belling; CB#: [phone]; Calling today 02/05/12 regarding Patient Put On Bromocriptine which she started on 01/21/12.  She is suppose to take 1/2 pill QD x1 week and then start taking 1 whole pill per day.  Has big bruise about the size apple on right forearm.  Onset 02/03/12. Concerned medication is causing her to bruise. Pt also states she is having chest pain that comes and goes which has been going on for about a week.  Pain is on left side of chest right below collar bone and feels like it goes all the way through her back and into her jaw.  Had a PE in right lung in April of this year. (Pt has extensive heart hx as well).  Emergent symptoms r/o by Chest Pain guidelines with exception of chest pain now or within last hour and known CAD.  Advised pt to call 911.  Pt resistant.  Stressed to pt that if she is having this type of pain it could be heart related, also with her hx of PE, she needs to call 911 to go to hospital for evaluation.  Pt verbalized understanding.  Advised pt to chew 325 mg ASA per guidelines.

## 2012-02-12 ENCOUNTER — Other Ambulatory Visit: Payer: Self-pay | Admitting: Cardiology

## 2012-04-09 ENCOUNTER — Other Ambulatory Visit: Payer: Self-pay | Admitting: Cardiology

## 2012-04-11 ENCOUNTER — Telehealth: Payer: Self-pay

## 2012-04-11 NOTE — Telephone Encounter (Signed)
patient needs to make appointment

## 2012-04-11 NOTE — Telephone Encounter (Signed)
..   Requested Prescriptions   Pending Prescriptions Disp Refills  . carvedilol (COREG) 12.5 MG tablet [Pharmacy Med Name: CARVEDILOL 12.5 MG TABLET] 90 tablet 0    Sig: TAKE 1 & 1/2 TABLETS BY MOUTH 2 TIMES A DAY  .Marland KitchenPatient needs to contact office to schedule  Appointment  for future refills.Ph:[phone]. Thank you.

## 2012-04-25 ENCOUNTER — Encounter: Payer: Self-pay | Admitting: Endocrinology

## 2012-04-25 ENCOUNTER — Other Ambulatory Visit (INDEPENDENT_AMBULATORY_CARE_PROVIDER_SITE_OTHER): Payer: Medicare Other

## 2012-04-25 ENCOUNTER — Ambulatory Visit (INDEPENDENT_AMBULATORY_CARE_PROVIDER_SITE_OTHER): Payer: Medicare Other | Admitting: Endocrinology

## 2012-04-25 VITALS — BP 136/88 | HR 78 | Temp 97.5°F | Ht 63.0 in | Wt 131.0 lb

## 2012-04-25 DIAGNOSIS — N058 Unspecified nephritic syndrome with other morphologic changes: Secondary | ICD-10-CM

## 2012-04-25 DIAGNOSIS — E1129 Type 2 diabetes mellitus with other diabetic kidney complication: Secondary | ICD-10-CM

## 2012-04-25 DIAGNOSIS — E1165 Type 2 diabetes mellitus with hyperglycemia: Secondary | ICD-10-CM

## 2012-04-25 NOTE — Progress Notes (Signed)
Subjective:    Patient ID: [name], female    DOB: 04-21-1928, 78 y.o.   MRN: [id number]  HPI Pt returns for f/u of type 2 DM (dx'ed 1992; complicated by TIA, CAD, peripheral sensory neuropathy, and renal insufficiency).  Oral rx has been limited by renal insufficiency.  She says she really does not want to take insulin.  She declines another insulin demonstration.  She does not even want to add other oral meds.  She says she still sometimes misses her DM meds, as he does not believe they are helping her.  no cbg record, but states cbg's are well-controlled Past Medical History  Diagnosis Date  . Pulmonary embolism   . Hypertension   . CAD (coronary artery disease)   . Cardiomyopathy   . Palpitation   . Paroxysmal atrial fibrillation   . Hypercholesterolemia   . Diabetes mellitus   . Nontoxic multinodular goiter   . Pure hypercholesterolemia   . Peptic ulcer, unspecified site, unspecified as acute or chronic, without mention of hemorrhage, perforation, or obstruction   . GERD (gastroesophageal reflux disease)   . Benign neoplasm of adrenal gland   . Diverticulosis of colon (without mention of hemorrhage)   . Calculus of kidney   . 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 DJD (degenerative joint disease)   . Unspecified transient cerebral ischemia   . Anxiety   . Neuropathy   . Unspecified transient cerebral ischemia   . H. pylori infection     Past Surgical History  Procedure Date  . Knee surgery     left  . Shoulder surgery     right  . Cataract extraction     History   Social History  . Marital Status: Widowed    Spouse Name: N/A    Number of Children: 1  . Years of Education: N/A   Occupational History  . retired    Social History Main Topics  . Smoking status: Former Smoker    Types: Cigarettes    Quit date: 08/18/1983  . Smokeless tobacco: Never Used  . Alcohol Use: Yes     occ wine, beer during holidays or at  social events  . Drug Use: No  . Sexually Active: Not on file   Other Topics Concern  . Not on file   Social History Narrative  . No narrative on file    Current Outpatient Prescriptions on File Prior to Visit  Medication Sig Dispense Refill  . amLODipine (NORVASC) 5 MG tablet TAKE 1 TABLET BY MOUTH EVERY DAY  30 tablet  0  . bromocriptine (PARLODEL) 2.5 MG tablet Take 1 tablet (2.5 mg total) by mouth daily.  30 tablet  11  . carvedilol (COREG) 12.5 MG tablet TAKE 1 & 1/2 TABLETS BY MOUTH 2 TIMES A DAY  90 tablet  0  . glimepiride (AMARYL) 2 MG tablet TAKE 1 TABLET BY MOUTH EVERY MORNING  30 tablet  6  . HYDROcodone-acetaminophen (LORCET) 10-650 MG per tablet Take 1 tablet by mouth every 6 (six) hours as needed.        Marland Kitchen losartan (COZAAR) 100 MG tablet TAKE 1 TABLET BY MOUTH ONCE DAILY...  30 tablet  3  . PRECISION XTRA TEST STRIPS test strip TEST BLOOD SUGAR ONCE DAILY AS DIRECTED  100 each  5  . sitaGLIPtan (JANUVIA) 100 MG tablet Take 100 mg by mouth daily.          Allergies  Allergen Reactions  . Lisinopril     REACTION: INTOL to ACE's w/ cough  . Penicillins     REACTION: rash    Family History  Problem Relation Age of Onset  . Diabetes Maternal Grandmother   . Heart disease Maternal Aunt     x2  . Lung cancer Mother   . Brain cancer Mother   . Goiter Mother   . Colon cancer Neg Hx     BP 136/88  Pulse 78  Temp 97.5 F (36.4 C) (Oral)  Ht 5\' 3"  (1.6 m)  Wt 131 lb (59.421 kg)  BMI 23.21 kg/m2  SpO2 99%    Review of Systems denies hypoglycemia    Objective:   Physical Exam VITAL SIGNS:  See vs page GENERAL: no distress Pulses: dorsalis pedis intact bilat.   Feet: no deformity.  no ulcer on the feet.  feet are of normal color and temp.  no edema Neuro: sensation is intact to touch on the feet.    Lab Results  Component Value Date   HGBA1C 9.1* 04/25/2012       Assessment & Plan:  DM, needs increased rx. therapy limited by noncompliance.  i'll do  the best i can.

## 2012-04-25 NOTE — Patient Instructions (Addendum)
Please make a follow-up appointment in 4 months. check your blood sugar 1 time a day.  vary the time of day when you check, between before the 3 meals, and at bedtime.  also check if you have symptoms of your blood sugar being too high or too low.  please keep a record of the readings and bring it to your next appointment here.  please call us sooner if you are having low blood sugar episodes.  blood tests are being requested for you today.  You will receive a letter with results. 

## 2012-04-26 ENCOUNTER — Encounter: Payer: Self-pay | Admitting: Endocrinology

## 2012-04-28 ENCOUNTER — Telehealth: Payer: Self-pay | Admitting: *Deleted

## 2012-04-28 NOTE — Telephone Encounter (Signed)
Called pt to inform of lab results, pt informed via VM and to callback office with any questions/concerns (letter also mailed to pt).  

## 2012-05-05 ENCOUNTER — Encounter: Payer: Self-pay | Admitting: Cardiology

## 2012-05-05 ENCOUNTER — Ambulatory Visit (INDEPENDENT_AMBULATORY_CARE_PROVIDER_SITE_OTHER): Payer: Medicare Other | Admitting: Cardiology

## 2012-05-05 VITALS — BP 155/95 | HR 54 | Ht 63.0 in | Wt 130.4 lb

## 2012-05-05 DIAGNOSIS — R0789 Other chest pain: Secondary | ICD-10-CM

## 2012-05-05 DIAGNOSIS — I4891 Unspecified atrial fibrillation: Secondary | ICD-10-CM

## 2012-05-05 DIAGNOSIS — I10 Essential (primary) hypertension: Secondary | ICD-10-CM

## 2012-05-05 DIAGNOSIS — I2510 Atherosclerotic heart disease of native coronary artery without angina pectoris: Secondary | ICD-10-CM

## 2012-05-05 MED ORDER — AMLODIPINE BESYLATE 2.5 MG PO TABS: 2.5000 mg | ORAL_TABLET | Freq: Every day | ORAL | Status: DC

## 2012-05-05 NOTE — Patient Instructions (Addendum)
Please increase your amlodipine to 7.5 mg a day.  A new prescription was sent into your pharmacy for the 2.5 mg tablets. Continue all other medications as listed.  Your physician has requested that you have a lexiscan myoview. For further information please visit [email or web address] Please follow instruction sheet, as given.  Follow up will be based on results of this testing.

## 2012-05-05 NOTE — Progress Notes (Signed)
HPI The patient presents for evaluation of chest discomfort. He has been a couple of years since I saw her. She has a nonischemic cardiomyopathy with nonobstructive coronary disease. GERD of her cardiomyopathy was felt probably to be hypertension. She reports that she has been getting episodes of her heart beating hard particularly at night. It is not fast or irregular. However, it bothers her. She does not describe shortness of breath, PND or orthopnea. She has also been getting some chest discomfort. This happens when she is rushing or emotionally rest such as while getting ready for church.  She says that her heart starts beating fast she gets an uncomfortable feeling in her chest. It may move up to her neck. In her chest it feels like a "wave".  It takes about 3 minutes for it to go away she has had some shortness of breath and nausea with this.  She does report that she can do yard work without bringing on this discomfort.     Allergies  Allergen Reactions  . Lisinopril     REACTION: INTOL to ACE's w/ cough  . Penicillins     REACTION: rash    Current Outpatient Prescriptions  Medication Sig Dispense Refill  . amLODipine (NORVASC) 5 MG tablet TAKE 1 TABLET BY MOUTH EVERY DAY  30 tablet  0  . bromocriptine (PARLODEL) 2.5 MG tablet Take 1 tablet (2.5 mg total) by mouth daily.  30 tablet  11  . carvedilol (COREG) 12.5 MG tablet TAKE 1 & 1/2 TABLETS BY MOUTH 2 TIMES A DAY  90 tablet  0  . glimepiride (AMARYL) 2 MG tablet TAKE 1 TABLET BY MOUTH EVERY MORNING  30 tablet  6  . HYDROcodone-acetaminophen (LORCET) 10-650 MG per tablet Take 1 tablet by mouth every 6 (six) hours as needed.        Marland Kitchen losartan (COZAAR) 100 MG tablet TAKE 1 TABLET BY MOUTH ONCE DAILY...  30 tablet  3  . PRECISION XTRA TEST STRIPS test strip TEST BLOOD SUGAR ONCE DAILY AS DIRECTED  100 each  5  . sitaGLIPtan (JANUVIA) 100 MG tablet Take 100 mg by mouth daily.          Past Medical History  Diagnosis Date  .  Pulmonary embolism   . Hypertension   . CAD (coronary artery disease)   . Cardiomyopathy   . Palpitation   . Paroxysmal atrial fibrillation   . Hypercholesterolemia   . Diabetes mellitus   . Nontoxic multinodular goiter   . Pure hypercholesterolemia   . Peptic ulcer, unspecified site, unspecified as acute or chronic, without mention of hemorrhage, perforation, or obstruction   . GERD (gastroesophageal reflux disease)   . Benign neoplasm of adrenal gland   . Diverticulosis of colon (without mention of hemorrhage)   . Calculus of kidney   . 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 DJD (degenerative joint disease)   . Unspecified transient cerebral ischemia   . Anxiety   . Neuropathy   . Unspecified transient cerebral ischemia   . H. pylori infection     Past Surgical History  Procedure Date  . Knee surgery     left  . Shoulder surgery     right  . Cataract extraction     ROS:  As stated in the HPI and negative for all other systems.  PHYSICAL EXAM BP 164/114  Pulse 54  Ht 5\' 3"  (1.6 m)  Wt 130 lb 6.4 oz (  59.149 kg)  BMI 23.10 kg/m2 GENERAL:  Well appearing HEENT:  Pupils equal round and reactive, fundi not visualized, oral mucosa unremarkable NECK:  No jugular venous distention, waveform within normal limits, carotid upstroke brisk and symmetric, no bruits, no thyromegaly LYMPHATICS:  No cervical, inguinal adenopathy LUNGS:  Clear to auscultation bilaterally BACK:  No CVA tenderness CHEST:  Unremarkable HEART:  PMI not displaced or sustained,S1 and S2 within normal limits, no S3, no S4, no clicks, no rubs, no murmurs ABD:  Flat, positive bowel sounds normal in frequency in pitch, no bruits, no rebound, no guarding, no midline pulsatile mass, no hepatomegaly, no splenomegaly EXT:  2 plus pulses throughout, no edema, no cyanosis no clubbing SKIN:  No rashes no nodules NEURO:  Cranial nerves II through XII grossly intact, motor grossly  intact throughout PSYCH:  Cognitively intact, oriented to person place and time   EKG:  Sinus rhythm, rate 54, left bundle branch, no acute ST-T wave changes.  05/05/2012  ASSESSMENT AND PLAN   CHEST PAIN The patient did have nonobstructive coronary disease on cath in 2010. She had higher grade small vessel disease. Her symptoms are worrisome and I will start with a Lexiscan Myoview.  Further evaluation will be based on future symptoms and the results of this study.  CARDIOMYOPATHY  For now I will not plan an echo but we'll evaluate her ejection fraction with the perfusion study above.  HYPERTENSION  In the past she has been reluctant to take more medications but she agrees to increase her Norvasc to 7.5 mg daily.  RENAL INSUFFICIENCY  No further evaluation is warrented at this point.

## 2012-05-11 ENCOUNTER — Ambulatory Visit (HOSPITAL_COMMUNITY): Payer: Medicare Other | Attending: Cardiology | Admitting: Radiology

## 2012-05-11 VITALS — BP 170/84 | Ht 62.0 in | Wt 130.0 lb

## 2012-05-11 DIAGNOSIS — R0602 Shortness of breath: Secondary | ICD-10-CM

## 2012-05-11 DIAGNOSIS — R110 Nausea: Secondary | ICD-10-CM | POA: Insufficient documentation

## 2012-05-11 DIAGNOSIS — R079 Chest pain, unspecified: Secondary | ICD-10-CM

## 2012-05-11 DIAGNOSIS — I2510 Atherosclerotic heart disease of native coronary artery without angina pectoris: Secondary | ICD-10-CM | POA: Insufficient documentation

## 2012-05-11 DIAGNOSIS — R0609 Other forms of dyspnea: Secondary | ICD-10-CM | POA: Insufficient documentation

## 2012-05-11 DIAGNOSIS — E119 Type 2 diabetes mellitus without complications: Secondary | ICD-10-CM | POA: Insufficient documentation

## 2012-05-11 DIAGNOSIS — R0989 Other specified symptoms and signs involving the circulatory and respiratory systems: Secondary | ICD-10-CM | POA: Insufficient documentation

## 2012-05-11 DIAGNOSIS — I10 Essential (primary) hypertension: Secondary | ICD-10-CM | POA: Insufficient documentation

## 2012-05-11 DIAGNOSIS — R0789 Other chest pain: Secondary | ICD-10-CM

## 2012-05-11 MED ORDER — REGADENOSON 0.4 MG/5ML IV SOLN: 0.4000 mg | Freq: Once | INTRAVENOUS | Status: DC

## 2012-05-11 MED ORDER — TECHNETIUM TC 99M SESTAMIBI GENERIC - CARDIOLITE
11.0000 | Freq: Once | INTRAVENOUS | Status: AC | PRN
  Administered 2012-05-11: 11 via INTRAVENOUS

## 2012-05-11 MED ORDER — ADENOSINE (DIAGNOSTIC) 3 MG/ML IV SOLN
0.5600 mg/kg | Freq: Once | INTRAVENOUS | Status: AC
  Administered 2012-05-11: 33 mg via INTRAVENOUS

## 2012-05-11 MED ORDER — TECHNETIUM TC 99M SESTAMIBI GENERIC - CARDIOLITE
33.0000 | Freq: Once | INTRAVENOUS | Status: AC | PRN
  Administered 2012-05-11: 33 via INTRAVENOUS

## 2012-05-11 MED ORDER — TECHNETIUM TC 99M SESTAMIBI GENERIC - CARDIOLITE: 33.0000 | Freq: Once | INTRAVENOUS | Status: DC | PRN

## 2012-05-11 MED ORDER — TECHNETIUM TC 99M SESTAMIBI GENERIC - CARDIOLITE: 11.0000 | Freq: Once | INTRAVENOUS | Status: DC | PRN

## 2012-05-11 NOTE — Progress Notes (Signed)
[facility] SITE 3 NUCLEAR MED [address] [place] [address] [phone]  Cardiology Nuclear Med Study  [name] is a 78 y.o. female     MRN : [id number]     DOB: 1927-09-14  Procedure Date: 05/11/2012  Nuclear Med Background Indication for Stress Test:  Evaluation for Ischemia History:  2010 MPS EF 35%  No ischemia,  2010 Heart Catheterization N/O CAD, 2010 Echo EF 30-35% Cardiac Risk Factors: History of Smoking, Hypertension, Lipids, NIDDM and TIA  Symptoms:  Chest Pain, DOE, Nausea and SOB   Nuclear Pre-Procedure Caffeine/Decaff Intake:  None NPO After: 11:30pm   Lungs:  clear O2 Sat: 98% on room air. IV 0.9% NS with Angio Cath:  22g  IV Site: R Forearm  IV Started by:  Stanton Kidney, EMT-P  Chest Size (in):  38 Cup Size: D  Height: 5\' 2"  (1.575 m)  Weight:  130 lb (58.968 kg)  BMI:  Body mass index is 23.78 kg/(m^2). Tech Comments:  Carvedilol held this am, per patient.    Nuclear Med Study 1 or 2 day study: 1 day  Stress Test Type:  Adenosine  Reading MD: Willa Rough, MD  Order Authorizing Provider:  Rollene Rotunda, MD  Resting Radionuclide: Technetium 70m Sestamibi  Resting Radionuclide Dose: 10.9 mCi   Stress Radionuclide:  Technetium 28m Sestamibi  Stress Radionuclide Dose: 33.0 mCi           Stress Protocol Rest HR: 54 Stress HR: 91  Rest BP: 170/84 Stress BP: 182/89  Exercise Time (min): n/a METS: n/a   Predicted Max HR: 138 bpm % Max HR: 65.94 bpm Rate Pressure Product: 46962   Dose of Adenosine (mg):  33.1 mg Dose of Lexiscan: n/a mg  Dose of Atropine (mg): n/a Dose of Dobutamine: n/a mcg/kg/min (at max HR)  Stress Test Technologist: Bonnita Levan, RN  Nuclear Technologist:  Domenic Polite, CNMT     Rest Procedure:  Myocardial perfusion imaging was performed at rest 45 minutes following the intravenous administration of Technetium 84m Sestamibi. Rest ECG: LBBB  Stress Procedure:  The patient received IV adenosine at  140 mcg/kg/min for 4-minutes with concurrent submaximal exercise (1.38mph, 0% grade).  There were no significant changes with infusion.  Technetium 34m Sestamibi was injected at the 2-minute mark and quantitative spect images were obtained. Stress ECG: No significant change from baseline ECG  QPS Raw Data Images:  Patient motion noted; appropriate software correction applied. Stress Images:  Normal homogeneous uptake in all areas of the myocardium. Rest Images:  Normal homogeneous uptake in all areas of the myocardium. Subtraction (SDS):  No evidence of ischemia. Transient Ischemic Dilatation (Normal <1.22):  1.09 Lung/Heart Ratio (Normal <0.45):  0.30  Quantitative Gated Spect Images QGS EDV:  121 ml QGS ESV:  68 ml  Impression Exercise Capacity:  Lexiscan with no exercise. BP Response:  Normal blood pressure response. Clinical Symptoms:  flushed sensation ECG Impression:  No significant ST segment change suggestive of ischemia. Comparison with Prior Nuclear Study: No images to compare  Overall Impression:  Wall motion is not normal. The patient has a history of abnormal wall motion. The nuclear images revealed no evidence of scar or ischemia.  LV Ejection Fraction: 44%.  LV Wall Motion:  There is hypokinesis of the septum and the inferior wall.  Willa Rough, MD

## 2012-05-12 ENCOUNTER — Ambulatory Visit: Payer: Medicare Other | Admitting: Cardiology

## 2012-05-31 ENCOUNTER — Encounter: Payer: Self-pay | Admitting: *Deleted

## 2012-06-01 ENCOUNTER — Encounter: Payer: Self-pay | Admitting: Pulmonary Disease

## 2012-06-01 ENCOUNTER — Other Ambulatory Visit (INDEPENDENT_AMBULATORY_CARE_PROVIDER_SITE_OTHER): Payer: Medicare Other

## 2012-06-01 ENCOUNTER — Ambulatory Visit (INDEPENDENT_AMBULATORY_CARE_PROVIDER_SITE_OTHER): Payer: Medicare Other | Admitting: Pulmonary Disease

## 2012-06-01 VITALS — BP 138/80 | HR 56 | Temp 97.6°F | Ht 62.0 in | Wt 131.6 lb

## 2012-06-01 DIAGNOSIS — I10 Essential (primary) hypertension: Secondary | ICD-10-CM

## 2012-06-01 DIAGNOSIS — N200 Calculus of kidney: Secondary | ICD-10-CM

## 2012-06-01 DIAGNOSIS — K5730 Diverticulosis of large intestine without perforation or abscess without bleeding: Secondary | ICD-10-CM

## 2012-06-01 DIAGNOSIS — I2510 Atherosclerotic heart disease of native coronary artery without angina pectoris: Secondary | ICD-10-CM

## 2012-06-01 DIAGNOSIS — F411 Generalized anxiety disorder: Secondary | ICD-10-CM

## 2012-06-01 DIAGNOSIS — G589 Mononeuropathy, unspecified: Secondary | ICD-10-CM

## 2012-06-01 DIAGNOSIS — G459 Transient cerebral ischemic attack, unspecified: Secondary | ICD-10-CM

## 2012-06-01 DIAGNOSIS — I43 Cardiomyopathy in diseases classified elsewhere: Secondary | ICD-10-CM

## 2012-06-01 DIAGNOSIS — E7800 Pure hypercholesterolemia, unspecified: Secondary | ICD-10-CM

## 2012-06-01 DIAGNOSIS — E042 Nontoxic multinodular goiter: Secondary | ICD-10-CM

## 2012-06-01 DIAGNOSIS — I428 Other cardiomyopathies: Secondary | ICD-10-CM

## 2012-06-01 DIAGNOSIS — E1129 Type 2 diabetes mellitus with other diabetic kidney complication: Secondary | ICD-10-CM

## 2012-06-01 DIAGNOSIS — I119 Hypertensive heart disease without heart failure: Secondary | ICD-10-CM

## 2012-06-01 DIAGNOSIS — K219 Gastro-esophageal reflux disease without esophagitis: Secondary | ICD-10-CM

## 2012-06-01 DIAGNOSIS — M1990 Unspecified osteoarthritis, unspecified site: Secondary | ICD-10-CM

## 2012-06-01 DIAGNOSIS — E1165 Type 2 diabetes mellitus with hyperglycemia: Secondary | ICD-10-CM

## 2012-06-01 DIAGNOSIS — I4891 Unspecified atrial fibrillation: Secondary | ICD-10-CM

## 2012-06-01 LAB — CBC WITH DIFFERENTIAL/PLATELET
Eosinophils Relative: 2 % (ref 0.0–5.0)
HCT: 37.6 % (ref 36.0–46.0)
Hemoglobin: 12.3 g/dL (ref 12.0–15.0)
Lymphs Abs: 2.6 10*3/uL (ref 0.7–4.0)
Monocytes Relative: 8.1 % (ref 3.0–12.0)
Neutro Abs: 4 10*3/uL (ref 1.4–7.7)
WBC: 7.4 10*3/uL (ref 4.5–10.5)

## 2012-06-01 LAB — LIPID PANEL
HDL: 75 mg/dL (ref >39.00)
Triglycerides: 189 mg/dL — ABNORMAL HIGH (ref 0.0–149.0)

## 2012-06-01 LAB — BASIC METABOLIC PANEL
BUN: 23 mg/dL (ref 6–23)
Calcium: 10.4 mg/dL (ref 8.4–10.5)
GFR: 48.64 mL/min — ABNORMAL LOW (ref >60.00)
Glucose, Bld: 161 mg/dL — ABNORMAL HIGH (ref 70–99)
Sodium: 140 mEq/L (ref 135–145)

## 2012-06-01 LAB — HEPATIC FUNCTION PANEL: Albumin: 3.7 g/dL (ref 3.5–5.2)

## 2012-06-01 MED ORDER — LOSARTAN POTASSIUM 100 MG PO TABS: 100.0000 mg | ORAL_TABLET | Freq: Every day | ORAL | Status: DC

## 2012-06-01 NOTE — Patient Instructions (Addendum)
Today we updated your med list in our EPIC system...    Continue your current medications the same...  Today we did your follow up FASTING blood work...    We will call you w/ the results when avail...  Continue your current meds, low carb diet, & follow ups w/ drEllison...  Call for any questions...  Let's plan another recheck in 6 months.Marland KitchenMarland Kitchen

## 2012-06-01 NOTE — Progress Notes (Signed)
Subjective:    Patient ID: [name], female    DOB: 03-24-1928, 78 y.o.   MRN: [id number]  HPI 78 y/o BF here for a follow up visit... he has multiple medical problems as noted below...    ~  September 02, 2010:  she has mult somatic complaints... Coumadin recently decr to 1/2 tab x 7d & continues monitoring thru [facility] office protocol... she saw DrEllison 12/11- states he reviewed 3 on-going medical problems, added Januvia 100mg /d but pt never got the message, & never picked up the medication... she saw DrHochrein 11/11 & he up-titrated her Coreg to 18.75mg  Bid (12.5mg  tabs- 1.5tabs Bid) & tol well... BP control remains fair> ?what she's actually taking "my home remedy of vinegar & garlic is helping my BP" she says... NOTE> labs today showed BS=122, A1c=8.5, & rec to take Metformin/ Glimep as perscribed & add the Januvia 100mg /d.  ~  December 12, 2010:  38mo ROV & Post Hosp check> since I saw her last she was [place] by [facility] (atypCP) & had OV f/u w/ DrHochrein & DrEllison> now off Coumadin rx... States she is doing fine & glad to be off the Coumadin; she did not bring med bottles & doesn't know her meds, she says they are "the same" > reviewed w/ pt the best I could (see meds below) & she is asked to bring all meds to every visit...  ~  June 15, 2011:  58mo ROV> she tells me that she fell about a month ago & "pulled the tendons in my knee" per DrACollins, treated w/ Hydrocodone 10mg  per Ortho;  BP appears poorly controlled largely due to poor med compliance- she has once again failed to bring her med bottle or her med list to the OV & she is encouraged to bring all bottle toevery doctor visit for review;  In addition she has stopped her statin med (Prev40) & her FLP today shows [id number] & LDL 170> we will refer to the [facility] for help;  She is overdue for a follow up visit w/ Cards & will call DrHochrein's office for appt; See prob list below:    >She saw DrEllison 9/12 & her A1c= 9.6 on Amaryl monotherapy>  he felt her poor control was due to noncompliance (she states intol to Metformin, won't take Actos "due to my heart" & stopped Januvia on her own);  She requested that we do labs and Bs=206, A1c=8.9 & she is asked to f/u w/ her Diabetic specialist to discuss the options left open to her for better control of her DM...    >She saw DrStark 5/12 for sm vol hematochezia, known divertics on CT Abd in 2006, hx prev FlexSig so he performed colonoscopy 5/12 w/mod divertics in sigmoid region & 2 polyps removed (20mm & 12mm), path= tubulovillous adenomas & repeat colon planned ?24yrs per DrStark...  ~  December 01, 2011:  5-38mo ROV & [name] will be 78 in a few days; she has been doing well she says "just the usual aches & pains" but she notes that she "fell over the dog" w/ torn lig in knee> went to ER & f/u w/ DrACarter- given brace & conservative Rx, improved;  She tells me she is "tired of medications" & this is a bad sign usually meaning that she has stopped several meds etc (eg- she is not taking the Prav40);  BP appears ok on her 3 meds below & she denies CP, palpit, SOB, edema, etc;  DM regulated by DrEllison  for Endocrine on below but recent A1c= 9.4 & his note of 3/13 is reviewed...  See prob list below>>  ~  May 31, 2012:  77mo ROV & [name] reports a good 77mo interval- denies any new complaints or concerns...    HBP> on Coreg12.5gm-1.5tabsBid, Norvasc5+2.5 & Cozaar100; BP= 138/80 & she denies CP, palpit, SOB, edema...    CAD, Cardiomyop, PAF> followed by DrHochrein & seen 9/13- Hx HBP, nonobstructive CAD, & nonischemic cardiomyop; Myoview showed no evid scar or ischemia, EF+44%, HK in septum & infer wall; he incr the Norvasc to 7.5mg /d.    Chol> refuses meds, ?on diet; FLP showed TChol 254, TG 189, HDL 75, LDL 154 and she continues to refuse med rx, referred to [facility].    DM> on Glimep2, Januv100, Parlodel2.5 per DrEllison (last seen 9/13); ?if taking regularly, she has refused additional meds or  insulin; she says "meds can lead to other disorders- maybe I should stop reading"; labs showed BS=161, A1c=9.1; Rec to take meds every day & f/u w/ DrEllison...    Multinod Thyroid> gland feels unchanged & TSH=1.53; Sonar 3/11 showed mult nodules, diffuse inhomogeneous echotexture & dominant left loer pole nodule- Bx= hyperplastic nodule...    Renal Insuffic> mild prob w/ Creat= 1.3 & we are following... We reviewed prob list, meds, xrays and labs> see below for updates >> she declines the Flu vaccine... LABS 10/13:  FLP- not at goals on diet alone;  Chems- ok x BS=161 A1c=9.1;  CBC- wnl;  TSH=1.53         Problem List:      Hx of PULMONARY EMBOLISM (ICD-415.19) - presented w/ pleuritic right CP 7/10 w/ CT angio showing pulm emboli & Rx w/ hep> coumadin... protimes were followed w/ the [facility] office protocol... ~  hosp by [facility] 9/10 for CP- she ruled out for MI & had f/u CT Angio- NEG for emboli (prev emboli resolved), & VenDopplers- NEG for DVT... ~  Protime 11/10= 49.4/ INR 4.8 (on Coumadin 5mg /d) & she's hi risk for bleed w/ hx DU & AVM- therefore coumadin stopped. ~  1/11:  Coumadin restarted by DrHochrein for PAF on Holter and elevated CHADS2 score, note: she is high risk for coumadin due to non-compliance, hx DU w/ bleed, & hx AVM in brain w/ SAH in 2001. ~  4/12:  Coumadin stopped during the 4/12 hospitalization by [facility] in consult w/ Cards- DrHochrein (Disch summary reviewed). ~  CXR 4/12 showed cardiomeg, clear lungs, NAD.Marland KitchenMarland Kitchen ~  CT Angio Chest 4/12 showed no PE, scat atheroclerotic calcif in Ao & coronaries, basilar atx, DJD spine...  HYPERTENSION (ICD-401.9) - long hx of signif HBP w/ hosp for [facility] in 2001 by DrKritzer (due to an AVM)> on COREG 12.5mg - 1.5tabsBid, COZAAR 100mg /d, & AMLODIPINE 7.5mg /d;  previously on Toprol XL & Lotrel- developed an ACE cough; long hx non-compliance w/ med Rx- she prefers garlic and lemon juice!!!  ~  4/10:  BP= 160/90 today, pt supposed to be on Bisoprolol 2.5mg   daily, and add Losartan 100mg /d. ~  7/10:  BP= 140/80 on Ziac2.5 + Cozaar100... reminded to take meds every day!!! ~  9/10:  BP= 132/82 on Ziac2.5 + Cozaar 100... continue same! ~  10/10: Hosp by [facility] and meds changed- COREG 3.125Bid + COZAAR100- 1/2 tab/d...  ~  11/10:  BP today= 146/90 & states she doesn't feel well- rec incr Cozaar back to 100mg /d. ~  2/11:  BP= 154/90 & supposed to be on Coreg 6.25Bid & Losartan 100mg /d-  Coreg incr to 12.5Bid. ~  9/11:  BP= 150/90 & pt reminded to take meds every day! ~  11/11:  DrHochrein up-titrated Coreg to 18.75mg  Bid, +Cozaar 100mg /d, BP= 142/84> ?compliance. ~  4/12:  BP= 132/76 & stable, continue same meds & remember to bring all meds to the office visits... ~  10/12:  BP= 160/88 & I suspect noncompliance w/ meds; encouraged to take all meds every day & bring bottles to every visit. ~  4/13:  BP= 142/80 (on Coreg, Norvasc, Cozaar), & she remains asymptomatic; denies CP, palpit, SOB, edema, etc... ~  10/13:  BP= 138/80 (on Coreg18.75, Norvasc7.5, Cozaar100) & she remains asymptomatic...  CORONARY ARTERY DISEASE (ICD-414.00) & CARDIOMYOPATHY (ICD-425.4) - followed by DrHochrein for Cards> she is ACE intolerant w/ cough, and has long hx of non-compliance w/ med Rx. ~  abn EKG w/ LBBB... ~  2DEcho 3/10 showed diffuse LV HK w/ EF~25%, mild calcif AoV, mild MR, mod dil LA & atrial septal aneurysm. ~  NuclearStressTest 2/10 showed cardiomyop w/ diffuse HK & EF~35%. ~  Cath 11/15/08 w/ EF= 30-35%, & non-obstructive CAD w/ 30-40% midLAD, 40-70% Diag branch of LAD, calcif in proxCIRC, & 30% midRCA. ~  Repeat 2DECho 7/10 showed infer AK, septal dysynergy, atrial septal aneurym, EF= 30-35%... ~  Hosp 10/10 by [facility] & r/o for MI, meds adjusted as noted. ~  4/12:  Hosp by [facility] w/ atyp CP & r/o for MI> disch for Cards f/u and any further out pt work up... ~  9/13:  She had f/u DrHochrein & EKG showed SBrady, rate54, IVCD, LAD; he increased her Norvasc to 7.5mg /d. ~   Myoview 9/13 showed no evid scar or ischemia, EF+44%, HK in septum & infer wall...  PAROXYSMAL ATRIAL FIBRILLATION (ICD-427.31) - eval for palpit by DrHochrein w/ PAF on Holter> he ordered Coumadin restarted 2/11 ==> it was discontinued again 4/12 Hosp...  HYPERCHOLESTEROLEMIA (ICD-272.0) - she refused meds in past & preferred Rx w/ red yeast rice, oatmeal, herbal remedies. ~  FLP 11/07 showed TChol 285, TG 155, HDL 80, LDL 165... obviously not taking med. ~  FLP 10/09 showed TChol 294, TG 205, HDL 78, LDL 157... she does not want to start statin Rx. ~  Discussed starting Rx in light of her cardiomyopathy & CAD on cath- she refuses statin Rx. ~  FLP 4/10 on diet showed TChol 211, TG 48, HDL 94, LDL 98... rec> keep up the great work! ~  FLP 2/11 on diet showed TChol 278, TG 303, HDL 74, LDL 154... rec> try PRAVACHOL40. ~  pt admits to taking the Prav40 "only now & again"> asked to take it daily. ~  FLP 1/12 on Prav40 showed TChol 198, TG 175, HDL 71, LDL 92 ~  FLP 10/12 off meds showed TChol 273, TG 151, HDL 79, LDL 170 (goal<70) & she was referred to Lipid Clinic. ~  11/12: she established w/ the Lipid Clinic & told them the only med she took regularly was the Coreg; diet noted to be poor, & counseled appropriately, asked to restart PRAV40- MWF but she never followed up w/ them... ~  4/13:  Pt advised on diet, exercise, & asked to restart PRAV40 as requested by Lipid Clinic; she was asked to sched f/u in the clinic to help lipid management... ~  FLP 10/13 showed TChol 254, TG 189, HDL 75, LDL 154... She is encouraged to f/u w/ LC...  DIABETES MELLITUS (ICD-250.00) - long hx of chronic non-compliance w/ med Rx ~  labs 10/09 showed BS= 319, HgA1c= 11.8.Marland KitchenMarland Kitchen re-started Metformin 500mg -2Bid + Glimepiride 4mg /d, but she c/o abd pain on the Metformin and stopped the Glimepiride on her own... referred to Endocrine. ~  DrEllison opted for Rx w/ ACTOS 45mg /d, & GLIMEPIRIDE 1mg - 1/2 tabAM (intol  Metformin). ~  labs 4/10 showed BS= 102, A1c= 6.9.Marland KitchenMarland Kitchen continue same. ~  7/10: regulated in hosp w/ GLUCOPHAGE 500- 1/2Bid + GLIMEPIRIDE 1mg /d... labs showed BS= 144, A1c= 7.4. ~  9/10: labs showed BS= 270, A1c= 7.3.Marland KitchenMarland Kitchen prev intol to Metform500, therefore incr GLIMEPIRIDE 2mg Qam (she refused). ~  11/10: Bs= 201, A1c= 8.4.Marland KitchenMarland Kitchen rec try to incr Metform500Bid & Glimep2mg /d... ~  labs 2/11 showed BS= 188, A1c= 9.0 (hasn't filled Metform in months & ?taking Glimep1mg ?). ~  labs 6/11 showed BS= 238, A1c=9.4 (Pharm confirms not taking meds regularly)... rec> take meds daily!  NOTE: Creat=1.2, Umicroalb=pos. ~  followed by DrEllison every 3 months- he tried to add Januvia, she never filled the Rx. ~  labs 1/12 showed BS= 122, A1c= 8.5.Marland KitchenMarland Kitchen rec to take Metform500Bid, Glimep2mg /d, Januvia100mg /d. ~  4/12 Hosp showed BS= 111-310 & A1c= 7.6; disch back on Metform500Bid, Amaryl 2mg , ?Januvia100 (?if taking)... ~  Labs 10/12 showed BS= 206, A1c= 8.9 ?on Amaryl monotherapy> poor control & encouraged to f/u w/ DrEllison/ DM management. ~  4/13:  We reviewed her DM mamagement under the direction of DrEllison; recent A1c=9.4 & compliance w/ diet & meds is the major issue> discussed w/ pt. ~  10/13:  Supposed to be on Glimep2, Januv100, Parlodel2.5- BS=161, A1c=9.1 and she will sched f/u w/ drEllison...  GERD (ICD-530.81) - previously on herbal remedies w/ prev eval from [facility] & DrNat-Mann in 1998...  ~  [facility] 6/10 w/ hematemesis & UGI bleed from DU +HPylori... eval by [facility] & Rx w/ OMPEPRAZOLE 40mg  & PYLERA (broken down to the generic Bismuth, Metronidazole, Tetracycline- she is Penicillin allergic) ~  GI f/u DrStark 9/10- HPylori eradicated, continue Omep40mg /d...  DIVERTICULOSIS OF COLON (ICD-562.10)  COLON POLYPS  ~  last procedure= FlexSig 1998 by DrStark showing divertics only... 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  NEPHROLITHIASIS & Mild RENAL INSUFFIC >> ~  Her renal function is normal w/ Creat = 1.1 - 1.2 range... ~  Labs 10/13 showed BUN= 23, Creat= 1.3  DEGENERATIVE JOINT DISEASE (ICD-715.90) - uses VICODIN Prn... ~  10/12:  She noted fall w/ ?ligament damage in right knee & treated by DrACarter w/ Vicodin...  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 sudden HA- eval revealed AVM & conservative approach recommended by Neurosurgery w/ BP control... ~  She has been reminded on numerous occas about the importance of BP control, taking meds everyday to prevent recurrent ICH, stroke, etc...  NEUROPATHY (ICD-355.9)  ANXIETY (ICD-300.00)   Past Surgical History  Procedure Date  . Knee surgery     left  . Shoulder surgery     right  . Cataract extraction     Outpatient Encounter Prescriptions as of 06/01/2012  Medication Sig Dispense Refill  . amLODipine (NORVASC) 2.5 MG tablet Take 1 tablet (2.5 mg total) by mouth  daily.  30 tablet  11  . amLODipine (NORVASC) 5 MG tablet TAKE 1 TABLET BY MOUTH EVERY DAY  30 tablet  0  . bromocriptine (PARLODEL) 2.5 MG tablet Take 1 tablet (2.5 mg total) by mouth daily.  30 tablet  11  . carvedilol (COREG) 12.5 MG tablet TAKE 1 & 1/2 TABLETS BY MOUTH 2 TIMES A DAY  90 tablet  0  . glimepiride (AMARYL) 2 MG tablet TAKE 1 TABLET BY MOUTH EVERY MORNING  30 tablet  6  . HYDROcodone-acetaminophen (LORCET) 10-650 MG per tablet Take 1 tablet by mouth every 6 (six) hours as needed.        Marland Kitchen losartan (COZAAR) 100 MG tablet TAKE 1 TABLET BY MOUTH ONCE DAILY...  30 tablet  3  . PRECISION XTRA TEST STRIPS test strip TEST BLOOD SUGAR ONCE DAILY AS  DIRECTED  100 each  5  . sitaGLIPtan (JANUVIA) 100 MG tablet Take 100 mg by mouth daily.          Allergies  Allergen Reactions  . Lisinopril     REACTION: INTOL to ACE's w/ cough  . Penicillins     REACTION: rash  . Statins     Muscle Aches    Current Medications, Allergies, Past Medical History, Past Surgical History, Family History, and Social History were reviewed in [employer] record.   Review of Systems         See HPI - all other systems neg except as noted... The patient complains of dyspnea on exertion.  The patient denies anorexia, fever, weight loss, weight gain, vision loss, decreased hearing, hoarseness, chest pain, syncope, peripheral edema, prolonged cough, headaches, hemoptysis, abdominal pain, melena, hematochezia, severe indigestion/heartburn, hematuria, incontinence, muscle weakness, suspicious skin lesions, transient blindness, difficulty walking, depression, unusual weight change, abnormal bleeding, enlarged lymph nodes, and angioedema.     Objective:   Physical Exam      WD, WN, 78 y/o BF in NAD... GENERAL:  Alert & oriented; pleasant & cooperative... HEENT:  [place]/AT, EOM-wnl, PERRLA, EACs-clear, TMs-wnl, NOSE-clear, THROAT-clear & wnl. NECK:  Supple w/ fairROM; no JVD; normal carotid impulses w/o bruits; no thyromegaly but sm nodule palpated; no lymphadenopathy. CHEST:  Clear to P & A; without wheezes/ rales/ or rhonchi. HEART:  Regular Rhythm; gr 1/6 SEM, no rubs or gallops heard... ABDOMEN:  Soft & nontender; normal bowel sounds; no organomegaly or masses detected. EXT: without deformities, mild arthritic changes; no varicose veins/ venous insuffic/ or edema. NEURO:  CN's intact; motor testing normal; sensory testing normal; gait normal & balance OK. DERM:  No lesions noted; no rash etc...  RADIOLOGY DATA:  Reviewed in the EPIC EMR & discussed w/ the patient...  LABORATORY DATA:  Reviewed in the EPIC EMR & discussed w/ the  patient...   Assessment & Plan:    CP, known CAD>  She was adm 4/12 w/ atypCP & ruled out, likely musculoskeletal, TH service consulted DrMcLean & contacted DrHochrein who indicated out-pt work up & agreed w/ stopping her Coumadin; she saw DrHochrein 9/13 w/ Myoview as above; on Coreg, Norvasc, Cozaar...  Hx PE in 2010>  She had neg CT Angio 4/12 hosp & her coumadin was stopped...  HBP>  Poorly controlled due to poor medication compliance- she is encouraged to take all meds everyday & fill her prescriptions every month; she is reminded to bring meds to each & every visit...  HxPAF>  She has maintained NSR and is followed by DrHochrein...  CHOL>  She refuses med Rx; stopped our prev Prav40 prescription; f/u FLP shows poor numbers & she is referred to the Lipid Clinic for their help.  DM>  Followed by DrEllison & her DM control is poor due to medication noncompliance & perceived side effects; she is rec to f/u w/ DrEllison ASAP to discuss options for treatment & better control.  Multinodular thyroid>  Clinically & biochem euthyroid...  GI>  Stable on her herbal remedies that she likes so much...  Renal Insuffic>  Creat= 1.3 at present...  HxAVM & TIA>  Aware, she denies cerebral ischemic symptoms; she is reminded of the critically important aspect of BP control to avoid ICH w/ her AVM.Marland KitchenMarland Kitchen   Patient's Medications  New Prescriptions   No medications on file  Previous Medications   AMLODIPINE (NORVASC) 2.5 MG TABLET    Take 1 tablet (2.5 mg total) by mouth daily.   AMLODIPINE (NORVASC) 5 MG TABLET    TAKE 1 TABLET BY MOUTH EVERY DAY   BROMOCRIPTINE (PARLODEL) 2.5 MG TABLET    Take 1 tablet (2.5 mg total) by mouth daily.   CARVEDILOL (COREG) 12.5 MG TABLET    TAKE 1 & 1/2 TABLETS BY MOUTH 2 TIMES A DAY   GLIMEPIRIDE (AMARYL) 2 MG TABLET    TAKE 1 TABLET BY MOUTH EVERY MORNING   PRECISION XTRA TEST STRIPS TEST STRIP    TEST BLOOD SUGAR ONCE DAILY AS DIRECTED   SITAGLIPTAN (JANUVIA) 100  MG TABLET    Take 100 mg by mouth daily.    Modified Medications   Modified Medication Previous Medication   LOSARTAN (COZAAR) 100 MG TABLET losartan (COZAAR) 100 MG tablet      Take 1 tablet (100 mg total) by mouth daily.    TAKE 1 TABLET BY MOUTH ONCE DAILY...  Discontinued Medications   HYDROCODONE-ACETAMINOPHEN (LORCET) 10-650 MG PER TABLET    Take 1 tablet by mouth every 6 (six) hours as needed.

## 2012-06-07 ENCOUNTER — Other Ambulatory Visit: Payer: Self-pay | Admitting: Pulmonary Disease

## 2012-06-07 DIAGNOSIS — E7800 Pure hypercholesterolemia, unspecified: Secondary | ICD-10-CM

## 2012-06-10 ENCOUNTER — Ambulatory Visit (INDEPENDENT_AMBULATORY_CARE_PROVIDER_SITE_OTHER): Payer: Medicare Other | Admitting: Pharmacist

## 2012-06-10 VITALS — Wt 130.5 lb

## 2012-06-10 DIAGNOSIS — E7800 Pure hypercholesterolemia, unspecified: Secondary | ICD-10-CM

## 2012-06-10 NOTE — Patient Instructions (Addendum)
Start Crestor 5mg  on Monday, Wednesday, and Friday.  If you have any problems, please call Kennon Rounds at [phone].   With your diet:  1.  Try to eat yogurt with fresh fruit rather than grits for breakfast/lunch 2.  Limit milk to 1-2 8 oz per day and limit juices to 4-8 oz per day  3.  Try not to snack as much during the middle of the night  Continue to work around your yard on a regular basis.    Recheck labs in 2-3 months.  We will call you in December to make an appt.

## 2012-06-10 NOTE — Assessment & Plan Note (Signed)
Pt's cholesterol uncontrolled.  TC- 254 (goal<200), TG- 189 (goal<150), HDL- 75 (goal>45), LDL- 154 (goal<70).  LFTs are WNL.  Pt states she is statin intolerant but she doesn't remember which ones she has tried and we do not have any intolerances documented- just noncompliance.  She is willing to retry medications as well as lifestyle changes.  Will try low dose Crestor 5mg  on MWF since she has issues with compliance.  Will follow up in 3 months.

## 2012-06-10 NOTE — Progress Notes (Signed)
HPI:  39 yoF presents to clinic in good spirits for a follow up visit with Lipid Clinic.  She was last seen ~1 year ago and started on pravastatin 40mg  MWF but pt never came back for her follow up appt.  She states she quit taking the pravastatin due to muscle aches in her L arm but there are no comments in the chart where she has ever mentioned this before.  She has a long history of noncompliance.  Reviewed pill bottles she brought in with her today.  She did have a 90 day supply of carvediolol that was filled on 04/11/12 but it was hard to tell how many she still had in the bottle.  Her losartan bottle was empty and last filled in July.  Bromocriptine was also empty but she states she gets samples of this from Dr. Everardo All.  The only other pills she had were the 2 amlodipine bottles, both of which were filled in early September and still had 14-18 tablets in the bottle.  She says her reason for not taking medications is that it is hard to make a habit and she doesn't like taking them.    Diet:  Pt's diet is poor.  She reports living alone and only eating 1-2 meals a day, with her first meal usually consisting of buttery grits,  eggs, toast, and hot dogs.  She likes to drink water, milk (up to 1/2 gallon a day in the summer), cranberry juice, or sprite zero.   Her second meal is chicken with salad, turnip greens, collards, cabbage, peas, string beans, broccoli, or califlower.  She does snack between 12-2am most mornings on graham crackers and peanut butter.   Exercise:  Pt states she walk around her 2 acre property and does the yard work.  She also likes to hip hop dance at her home.    Current Outpatient Prescriptions  Medication Sig Dispense Refill  . carvedilol (COREG) 12.5 MG tablet TAKE 1 & 1/2 TABLETS BY MOUTH 2 TIMES A DAY  90 tablet  0  . amLODipine (NORVASC) 2.5 MG tablet Take 1 tablet (2.5 mg total) by mouth daily.  30 tablet  11  . amLODipine (NORVASC) 5 MG tablet TAKE 1 TABLET BY MOUTH EVERY  DAY  30 tablet  0  . bromocriptine (PARLODEL) 2.5 MG tablet Take 1 tablet (2.5 mg total) by mouth daily.  30 tablet  11  . glimepiride (AMARYL) 2 MG tablet TAKE 1 TABLET BY MOUTH EVERY MORNING  30 tablet  6  . losartan (COZAAR) 100 MG tablet Take 1 tablet (100 mg total) by mouth daily.  30 tablet  11  . PRECISION XTRA TEST STRIPS test strip TEST BLOOD SUGAR ONCE DAILY AS DIRECTED  100 each  5  . sitaGLIPtan (JANUVIA) 100 MG tablet Take 100 mg by mouth daily.         Allergies  Allergen Reactions  . Lisinopril     REACTION: INTOL to ACE's w/ cough  . Penicillins     REACTION: rash  . Statins     Muscle Aches

## 2012-06-18 ENCOUNTER — Other Ambulatory Visit: Payer: Self-pay | Admitting: Cardiology

## 2012-06-20 NOTE — Telephone Encounter (Signed)
..   Requested Prescriptions   Pending Prescriptions Disp Refills  . carvedilol (COREG) 12.5 MG tablet [Pharmacy Med Name: CARVEDILOL 12.5 MG TABLET] 90 tablet 6    Sig: TAKE 1 & 1/2 TABLETS BY MOUTH 2 TIMES A DAY

## 2012-06-27 ENCOUNTER — Telehealth: Payer: Self-pay | Admitting: Endocrinology

## 2012-06-27 NOTE — Telephone Encounter (Signed)
Pt advised we do not have samples at that this time , pt states she haS a rx that she wll get filled

## 2012-06-27 NOTE — Telephone Encounter (Signed)
Pt advised no samples at this time, pt states she has a rx that she will get filled

## 2012-06-27 NOTE — Telephone Encounter (Signed)
Patient is requesting samples of januvia and bromocriptine

## 2012-08-22 ENCOUNTER — Encounter: Payer: Self-pay | Admitting: Endocrinology

## 2012-08-22 ENCOUNTER — Ambulatory Visit (INDEPENDENT_AMBULATORY_CARE_PROVIDER_SITE_OTHER): Payer: Medicare Other | Admitting: Endocrinology

## 2012-08-22 VITALS — BP 128/82 | HR 76 | Wt 129.0 lb

## 2012-08-22 DIAGNOSIS — E1129 Type 2 diabetes mellitus with other diabetic kidney complication: Secondary | ICD-10-CM

## 2012-08-22 DIAGNOSIS — E1165 Type 2 diabetes mellitus with hyperglycemia: Secondary | ICD-10-CM

## 2012-08-22 NOTE — Patient Instructions (Addendum)
Please make a follow-up appointment in 4 months.  check your blood sugar 1 time a day.  vary the time of day when you check, between before the 3 meals, and at bedtime.  also check if you have symptoms of your blood sugar being too high or too low.  please keep a record of the readings and bring it to your next appointment here.  please call us sooner if you are having low blood sugar episodes.   blood tests are being requested for you today.  We'll contact you with results.  The results may again say you need to take insulin.  Here is a sample of a "lantus" pen.  i'll advise you if you need to take this.

## 2012-08-22 NOTE — Progress Notes (Signed)
Subjective:    Patient ID: [name], female    DOB: [birth year], 79 y.o.   MRN: [id number]  HPI Pt returns for f/u of type 2 DM (dx'ed 1992; complicated by TIA, CAD, peripheral sensory neuropathy, and renal insufficiency; oral rx has been limited by renal insufficiency; she says she really does not want to take insulin, but she will take it now if advised).  no cbg record, but states cbg's are well-controlled.   Past Medical History  Diagnosis Date  . Pulmonary embolism   . Hypertension   . CAD (coronary artery disease)   . Cardiomyopathy   . Palpitation   . Paroxysmal atrial fibrillation   . Hypercholesterolemia   . Diabetes mellitus   . Nontoxic multinodular goiter   . Pure hypercholesterolemia   . Peptic ulcer, unspecified site, unspecified as acute or chronic, without mention of hemorrhage, perforation, or obstruction   . GERD (gastroesophageal reflux disease)   . Benign neoplasm of adrenal gland   . Diverticulosis of colon (without mention of hemorrhage)   . Calculus of kidney   . 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 DJD (degenerative joint disease)   . Unspecified transient cerebral ischemia   . Anxiety   . Neuropathy   . Unspecified transient cerebral ischemia   . H. pylori infection     Past Surgical History  Procedure Date  . Knee surgery     left  . Shoulder surgery     right  . Cataract extraction     History   Social History  . Marital Status: Widowed    Spouse Name: N/A    Number of Children: 1  . Years of Education: N/A   Occupational History  . retired    Social History Main Topics  . Smoking status: Former Smoker    Types: Cigarettes    Quit date: 08/18/1983  . Smokeless tobacco: Never Used  . Alcohol Use: Yes     Comment: occ wine, beer during holidays or at social events  . Drug Use: No  . Sexually Active: Not on file   Other Topics Concern  . Not on file   Social History Narrative  . No  narrative on file    Current Outpatient Prescriptions on File Prior to Visit  Medication Sig Dispense Refill  . amLODipine (NORVASC) 2.5 MG tablet Take 1 tablet (2.5 mg total) by mouth daily.  30 tablet  11  . amLODipine (NORVASC) 5 MG tablet TAKE 1 TABLET BY MOUTH EVERY DAY  30 tablet  0  . bromocriptine (PARLODEL) 2.5 MG tablet Take 1 tablet (2.5 mg total) by mouth daily.  30 tablet  11  . carvedilol (COREG) 12.5 MG tablet TAKE 1 & 1/2 TABLETS BY MOUTH 2 TIMES A DAY  90 tablet  6  . glimepiride (AMARYL) 2 MG tablet TAKE 1 TABLET BY MOUTH EVERY MORNING  30 tablet  6  . losartan (COZAAR) 100 MG tablet Take 1 tablet (100 mg total) by mouth daily.  30 tablet  11  . PRECISION XTRA TEST STRIPS test strip TEST BLOOD SUGAR ONCE DAILY AS DIRECTED  100 each  5  . sitaGLIPtan (JANUVIA) 100 MG tablet Take 100 mg by mouth daily.          Allergies  Allergen Reactions  . Lisinopril     REACTION: INTOL to ACE's w/ cough  . Penicillins     REACTION: rash  . Statins  Muscle Aches    Family History  Problem Relation Age of Onset  . Diabetes Maternal Grandmother   . Heart disease Maternal Aunt     x2  . Lung cancer Mother   . Brain cancer Mother   . Goiter Mother   . Colon cancer Neg Hx     BP 128/82  Pulse 76  Wt 129 lb (58.514 kg)  SpO2 93%  Review of Systems denies hypoglycemia.      Objective:   Physical Exam VITAL SIGNS:  See vs page GENERAL: no distress Pulses: dorsalis pedis intact bilat.   Feet: no deformity.  no ulcer on the feet.  feet are of normal color and temp.  no edema.   Neuro: sensation is intact to touch on the feet.      Assessment & Plan:  DM.  She needs insulin.  I demonstrated lantus pen

## 2012-08-23 LAB — HEMOGLOBIN A1C
Hgb A1c MFr Bld: 8.4 % — ABNORMAL HIGH (ref <5.7)
Mean Plasma Glucose: 194 mg/dL — ABNORMAL HIGH (ref <117)

## 2012-08-23 LAB — MICROALBUMIN / CREATININE URINE RATIO: Creatinine, Urine: 65.9 mg/dL

## 2012-09-07 ENCOUNTER — Ambulatory Visit: Payer: Medicare Other | Admitting: Pharmacist

## 2012-09-14 ENCOUNTER — Ambulatory Visit: Payer: Medicare Other | Admitting: Endocrinology

## 2012-09-14 ENCOUNTER — Ambulatory Visit: Payer: Medicare Other | Admitting: Pharmacist

## 2012-09-19 ENCOUNTER — Ambulatory Visit (INDEPENDENT_AMBULATORY_CARE_PROVIDER_SITE_OTHER): Payer: Medicare Other | Admitting: Pharmacist

## 2012-09-19 ENCOUNTER — Ambulatory Visit (INDEPENDENT_AMBULATORY_CARE_PROVIDER_SITE_OTHER): Payer: Medicare Other | Admitting: Endocrinology

## 2012-09-19 ENCOUNTER — Encounter: Payer: Self-pay | Admitting: Endocrinology

## 2012-09-19 VITALS — BP 126/80 | HR 62 | Wt 128.0 lb

## 2012-09-19 VITALS — Wt 130.0 lb

## 2012-09-19 DIAGNOSIS — E7800 Pure hypercholesterolemia, unspecified: Secondary | ICD-10-CM

## 2012-09-19 DIAGNOSIS — E1129 Type 2 diabetes mellitus with other diabetic kidney complication: Secondary | ICD-10-CM

## 2012-09-19 DIAGNOSIS — E1165 Type 2 diabetes mellitus with hyperglycemia: Secondary | ICD-10-CM

## 2012-09-19 MED ORDER — INSULIN GLARGINE 100 UNIT/ML ~~LOC~~ SOLN: 10.0000 [IU] | SUBCUTANEOUS | Status: DC

## 2012-09-19 NOTE — Progress Notes (Signed)
Subjective:    Patient ID: [name], female    DOB: May 15, 1928, 79 y.o.   MRN: [id number]  HPI Pt returns for f/u of type 2 DM (dx'ed 1992; complicated by TIA, CAD, peripheral sensory neuropathy, and renal insufficiency; oral rx has been limited by renal insufficiency; she takes insulin as rx'ed).  no cbg record, but states cbg's are in the low-100's.   Past Medical History  Diagnosis Date  . Pulmonary embolism   . Hypertension   . CAD (coronary artery disease)   . Cardiomyopathy   . Palpitation   . Paroxysmal atrial fibrillation   . Hypercholesterolemia   . Diabetes mellitus   . Nontoxic multinodular goiter   . Pure hypercholesterolemia   . Peptic ulcer, unspecified site, unspecified as acute or chronic, without mention of hemorrhage, perforation, or obstruction   . GERD (gastroesophageal reflux disease)   . Benign neoplasm of adrenal gland   . Diverticulosis of colon (without mention of hemorrhage)   . Calculus of kidney   . 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 DJD (degenerative joint disease)   . Unspecified transient cerebral ischemia   . Anxiety   . Neuropathy   . Unspecified transient cerebral ischemia   . H. pylori infection     Past Surgical History  Procedure Date  . Knee surgery     left  . Shoulder surgery     right  . Cataract extraction     History   Social History  . Marital Status: Widowed    Spouse Name: N/A    Number of Children: 1  . Years of Education: N/A   Occupational History  . retired    Social History Main Topics  . Smoking status: Former Smoker    Types: Cigarettes    Quit date: 08/18/1983  . Smokeless tobacco: Never Used  . Alcohol Use: Yes     Comment: occ wine, beer during holidays or at social events  . Drug Use: No  . Sexually Active: Not on file   Other Topics Concern  . Not on file   Social History Narrative  . No narrative on file    Current Outpatient Prescriptions  on File Prior to Visit  Medication Sig Dispense Refill  . amLODipine (NORVASC) 2.5 MG tablet Take 1 tablet (2.5 mg total) by mouth daily.  30 tablet  11  . amLODipine (NORVASC) 5 MG tablet TAKE 1 TABLET BY MOUTH EVERY DAY  30 tablet  0  . carvedilol (COREG) 12.5 MG tablet TAKE 1 & 1/2 TABLETS BY MOUTH 2 TIMES A DAY  90 tablet  6  . losartan (COZAAR) 100 MG tablet Take 1 tablet (100 mg total) by mouth daily.  30 tablet  11  . PRECISION XTRA TEST STRIPS test strip TEST BLOOD SUGAR ONCE DAILY AS DIRECTED  100 each  5  . insulin glargine (LANTUS SOLOSTAR) 100 UNIT/ML injection Inject 10 Units into the skin every morning. And pen needles 1/day  5 pen  PRN    Allergies  Allergen Reactions  . Lisinopril     REACTION: INTOL to ACE's w/ cough  . Penicillins     REACTION: rash  . Statins     Muscle Aches    Family History  Problem Relation Age of Onset  . Diabetes Maternal Grandmother   . Heart disease Maternal Aunt     x2  . Lung cancer Mother   . Brain cancer Mother   .  Goiter Mother   . Colon cancer Neg Hx     BP 126/80  Pulse 62  Wt 128 lb (58.06 kg)  SpO2 95%  Review of Systems denies hypoglycemia    Objective:   Physical Exam Pulses: dorsalis pedis intact bilat.   Feet: no deformity.  no ulcer on the feet.  feet are of normal color and temp.  no edema Neuro: sensation is intact to touch on the feet.       Assessment & Plan:  DM: she is ready to transition from orals to insulin

## 2012-09-19 NOTE — Progress Notes (Signed)
HPI:  55 yoF presents to clinic in good spirits for a follow up visit with [facility].  She was last seen in October 2013. At that time she was started on Crestor 5mg  MWF.   She states she quit taking the Crestor due to muscle aches in her L arm and right thigh.  She states this is the same pain she has gotten with all statins.  She has a long history of noncompliance.   She states compliance with other meds but given history of non-compliance - see Sally's note from 05/2012, I am not sure.   Diet:  Pt's diet is poor.  She reports living alone and only eating 1-2 meals a day, with her first meal usually consisting of buttery grits,  eggs, toast, and hot dogs.  She likes to drink water, milk (up to 1/2 gallon a day in the summer), cranberry juice, or sprite zero.   Her second meal is chicken with salad, turnip greens, collards, cabbage, peas, string beans, broccoli, or califlower.  She does snack between 12-2am most mornings on graham crackers and peanut butter.   Exercise:  Pt states she walk around her 2 acre property and does the yard work.  She also likes to hip hop dance at her home. She states [place] 1[place]2 [place] to the [place] and [facility] from her home which she walks to about 1 time week.     Current Outpatient Prescriptions  Medication Sig Dispense Refill  . amLODipine (NORVASC) 2.5 MG tablet Take 1 tablet (2.5 mg total) by mouth daily.  30 tablet  11  . amLODipine (NORVASC) 5 MG tablet TAKE 1 TABLET BY MOUTH EVERY DAY  30 tablet  0  . carvedilol (COREG) 12.5 MG tablet TAKE 1 & 1/2 TABLETS BY MOUTH 2 TIMES A DAY  90 tablet  6  . insulin glargine (LANTUS SOLOSTAR) 100 UNIT/ML injection Inject 10 Units into the skin every morning. And pen needles 1/day  5 pen  PRN  . losartan (COZAAR) 100 MG tablet Take 1 tablet (100 mg total) by mouth daily.  30 tablet  11  . PRECISION XTRA TEST STRIPS test strip TEST BLOOD SUGAR ONCE DAILY AS DIRECTED  100 each  5   Allergies  Allergen Reactions  . Lisinopril      REACTION: INTOL to ACE's w/ cough  . Penicillins     REACTION: rash  . Statins     Muscle Aches

## 2012-09-19 NOTE — Patient Instructions (Addendum)
Your bad cholesterol is too high. 1.  Walk to [facility] or [facility] 3 times a week 2.  Throw out the yellow part of the egg 3.  Take Zetia 10mg  1 tablet each day 4.  Decrease milk to 1 cup a day 5.  Go to Dr. Jodelle Green office for fasting lab work the week of March 3.   6.  Come for cholesterol appointment Mon March 10th at 2pm

## 2012-09-19 NOTE — Patient Instructions (Addendum)
Please make a follow-up appointment in 1 month.  check your blood sugar 1 time a day.  vary the time of day when you check, between before the 3 meals, and at bedtime.  also check if you have symptoms of your blood sugar being too high or too low.  please keep a record of the readings and bring it to your next appointment here.  please call us sooner if you are having low blood sugar episodes.   Please stop taking the glimepiride, bromocriptine, and januvia. Increase the insulin to 10 units each morning.  This probably is not enough, so please call if it is over 200.

## 2012-09-22 ENCOUNTER — Other Ambulatory Visit: Payer: Self-pay | Admitting: Pulmonary Disease

## 2012-09-22 ENCOUNTER — Telehealth: Payer: Self-pay | Admitting: Endocrinology

## 2012-09-22 ENCOUNTER — Telehealth: Payer: Self-pay | Admitting: Cardiology

## 2012-09-22 NOTE — Telephone Encounter (Signed)
New Problem    Pt states she is getting labs done at the [place] office but she states they do not have an order for the labs. Pt was unable to successfully schedule an appt to get her labs drawn.

## 2012-09-22 NOTE — Telephone Encounter (Signed)
PATIENT NOTIFIED OF LAB ORDERS ARE NOW PUT IN FOR HER TO GO [facility] .

## 2012-09-22 NOTE — Telephone Encounter (Signed)
NA at home number -- Dr Antoine Poche has not ordered and lab work on pt.  Orders are in the computer system for lipid and hepatic panel - placed 09/22/2012 for lipid clinic.  An appt for blood work is noted scheduled in March.  I can not tell from other documentation that any thing else has been ordered or is needed.

## 2012-09-22 NOTE — Assessment & Plan Note (Signed)
[name] is a spunky 84yof with Hx med non-compliance especially in regards to statin therapy.  Apparently, all statins have cause L upper arm pain and right thigh pain.   TC 254 > goal < 200, TG 189 > goal < 150, LDL 154 > goal < 100, HDL 75 at goal > 40.   We spent much of the visit discussing low fat and low CHO options.  We discussed exercise of walking 1mi 3xweek.   She is willing to try Zetia 10mg  daily and samples were given.   She will follow up in 4 weeks to assess lifestyle modification and medication compliance/benefit.

## 2012-09-22 NOTE — Telephone Encounter (Signed)
During further review it is noted the pt contacted primary and they placed the orders.  She was notified.

## 2012-09-22 NOTE — Telephone Encounter (Signed)
Pt called lab at [facility] office, she needs an order for a fasting lab. [facility] told her they did not have an order in the system. Please call pt once order has been placed at [phone]. Sherri S.

## 2012-09-23 ENCOUNTER — Other Ambulatory Visit (INDEPENDENT_AMBULATORY_CARE_PROVIDER_SITE_OTHER): Payer: Medicare Other

## 2012-09-23 DIAGNOSIS — E7800 Pure hypercholesterolemia, unspecified: Secondary | ICD-10-CM

## 2012-09-23 LAB — HEPATIC FUNCTION PANEL
Bilirubin, Direct: 0.1 mg/dL (ref 0.0–0.3)
Total Bilirubin: 0.6 mg/dL (ref 0.3–1.2)

## 2012-09-23 LAB — LIPID PANEL
Cholesterol: 291 mg/dL — ABNORMAL HIGH (ref 0–200)
HDL: 77.1 mg/dL
Total CHOL/HDL Ratio: 4
Triglycerides: 176 mg/dL — ABNORMAL HIGH (ref 0.0–149.0)
VLDL: 35.2 mg/dL (ref 0.0–40.0)

## 2012-09-27 ENCOUNTER — Other Ambulatory Visit: Payer: Self-pay | Admitting: Pharmacist

## 2012-09-27 DIAGNOSIS — E7800 Pure hypercholesterolemia, unspecified: Secondary | ICD-10-CM

## 2012-10-17 ENCOUNTER — Ambulatory Visit: Payer: Medicare Other | Admitting: Endocrinology

## 2012-10-17 ENCOUNTER — Other Ambulatory Visit: Payer: Medicare Other

## 2012-10-19 ENCOUNTER — Ambulatory Visit: Payer: Medicare Other | Admitting: Endocrinology

## 2012-10-19 DIAGNOSIS — Z0289 Encounter for other administrative examinations: Secondary | ICD-10-CM

## 2012-10-24 ENCOUNTER — Ambulatory Visit: Payer: Medicare Other | Admitting: Endocrinology

## 2012-10-24 ENCOUNTER — Ambulatory Visit: Payer: Medicare Other | Admitting: Pharmacist

## 2012-10-27 ENCOUNTER — Other Ambulatory Visit: Payer: Medicare Other

## 2012-10-28 ENCOUNTER — Other Ambulatory Visit (INDEPENDENT_AMBULATORY_CARE_PROVIDER_SITE_OTHER): Payer: Medicare Other

## 2012-10-28 ENCOUNTER — Encounter: Payer: Self-pay | Admitting: Endocrinology

## 2012-10-28 ENCOUNTER — Ambulatory Visit (INDEPENDENT_AMBULATORY_CARE_PROVIDER_SITE_OTHER): Payer: Medicare Other | Admitting: Endocrinology

## 2012-10-28 VITALS — BP 134/82 | HR 67 | Wt 128.0 lb

## 2012-10-28 DIAGNOSIS — E1129 Type 2 diabetes mellitus with other diabetic kidney complication: Secondary | ICD-10-CM

## 2012-10-28 DIAGNOSIS — E7800 Pure hypercholesterolemia, unspecified: Secondary | ICD-10-CM

## 2012-10-28 DIAGNOSIS — E1165 Type 2 diabetes mellitus with hyperglycemia: Secondary | ICD-10-CM

## 2012-10-28 LAB — LIPID PANEL
Cholesterol: 251 mg/dL — ABNORMAL HIGH (ref 0–200)
HDL: 86.5 mg/dL (ref >39.00)
Total CHOL/HDL Ratio: 3
Triglycerides: 135 mg/dL (ref 0.0–149.0)
VLDL: 27 mg/dL (ref 0.0–40.0)

## 2012-10-28 LAB — LDL CHOLESTEROL, DIRECT: Direct LDL: 121.4 mg/dL

## 2012-10-28 LAB — HEPATIC FUNCTION PANEL: Albumin: 4 g/dL (ref 3.5–5.2)

## 2012-10-28 NOTE — Patient Instructions (Addendum)
Please make a follow-up appointment in 1 month.  check your blood sugar twice a day.  vary the time of day when you check, between before the 3 meals, and at bedtime.  also check if you have symptoms of your blood sugar being too high or too low.  please keep a record of the readings and bring it to your next appointment here.  please call us sooner if you are having low blood sugar episodes.   Please continue the same insulin for now. On this type of insulin, it is not safe to miss meals.

## 2012-10-28 NOTE — Progress Notes (Signed)
Subjective:    Patient ID: [name], female    DOB: [birth year], 79 y.o.   MRN: [id number]  HPI Pt returns for f/u of type 2 DM (dx'ed 1992; complicated by TIA, CAD, peripheral sensory neuropathy, and renal insufficiency; oral rx has been limited by renal insufficiency, so she takes insulin).  She was recently started on insulin.  no cbg record, but states cbg's vary from 79-200's.  It is lowest when she misses lunch. Past Medical History  Diagnosis Date  . Pulmonary embolism   . Hypertension   . CAD (coronary artery disease)   . Cardiomyopathy   . Palpitation   . Paroxysmal atrial fibrillation   . Hypercholesterolemia   . Diabetes mellitus   . Nontoxic multinodular goiter   . Pure hypercholesterolemia   . Peptic ulcer, unspecified site, unspecified as acute or chronic, without mention of hemorrhage, perforation, or obstruction   . GERD (gastroesophageal reflux disease)   . Benign neoplasm of adrenal gland   . Diverticulosis of colon (without mention of hemorrhage)   . Calculus of kidney   . 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 DJD (degenerative joint disease)   . Unspecified transient cerebral ischemia   . Anxiety   . Neuropathy   . Unspecified transient cerebral ischemia   . H. pylori infection     Past Surgical History  Procedure Laterality Date  . Knee surgery      left  . Shoulder surgery      right  . Cataract extraction      History   Social History  . Marital Status: Widowed    Spouse Name: N/A    Number of Children: 1  . Years of Education: N/A   Occupational History  . retired    Social History Main Topics  . Smoking status: Former Smoker    Types: Cigarettes    Quit date: 08/18/1983  . Smokeless tobacco: Never Used  . Alcohol Use: Yes     Comment: occ wine, beer during holidays or at social events  . Drug Use: No  . Sexually Active: Not on file   Other Topics Concern  . Not on file   Social  History Narrative  . No narrative on file    Current Outpatient Prescriptions on File Prior to Visit  Medication Sig Dispense Refill  . amLODipine (NORVASC) 2.5 MG tablet Take 1 tablet (2.5 mg total) by mouth daily.  30 tablet  11  . amLODipine (NORVASC) 5 MG tablet TAKE 1 TABLET BY MOUTH EVERY DAY  30 tablet  6  . carvedilol (COREG) 12.5 MG tablet TAKE 1 & 1/2 TABLETS BY MOUTH 2 TIMES A DAY  90 tablet  6  . insulin glargine (LANTUS SOLOSTAR) 100 UNIT/ML injection Inject 10 Units into the skin every morning. And pen needles 1/day  5 pen  PRN  . losartan (COZAAR) 100 MG tablet Take 1 tablet (100 mg total) by mouth daily.  30 tablet  11  . PRECISION XTRA TEST STRIPS test strip TEST BLOOD SUGAR ONCE DAILY AS DIRECTED  100 each  5   No current facility-administered medications on file prior to visit.    Allergies  Allergen Reactions  . Lisinopril     REACTION: INTOL to ACE's w/ cough  . Penicillins     REACTION: rash  . Statins     Muscle Aches    Family History  Problem Relation Age of Onset  . Diabetes  Maternal Grandmother   . Heart disease Maternal Aunt     x2  . Lung cancer Mother   . Brain cancer Mother   . Goiter Mother   . Colon cancer Neg Hx     BP 134/82  Pulse 67  Wt 128 lb (58.06 kg)  BMI 23.41 kg/m2  SpO2 96%   Review of Systems denies hypoglycemia    Objective:   Physical Exam VITAL SIGNS:  See vs page GENERAL: no distress PSYCH: Alert and oriented x 3.  Does not appear anxious nor depressed.    Assessment & Plan:  DM: control is improved on insulin

## 2012-11-01 ENCOUNTER — Ambulatory Visit: Payer: Medicare Other | Admitting: Pharmacist

## 2012-11-02 ENCOUNTER — Ambulatory Visit (INDEPENDENT_AMBULATORY_CARE_PROVIDER_SITE_OTHER): Payer: Medicare Other | Admitting: Pharmacist

## 2012-11-02 VITALS — Wt 127.8 lb

## 2012-11-02 DIAGNOSIS — E7800 Pure hypercholesterolemia, unspecified: Secondary | ICD-10-CM

## 2012-11-02 NOTE — Patient Instructions (Signed)
Continue Zetia 10mg  daily Good job on decreasing fats in your diet Ok to eat small bowl of grits a few mornings per week and have some yogurt if still hungry Keep up your dancing  Start walking when the weather gets better Follow up in Lipid Clinic with Kennon Rounds in 6 months Tues Sept 16 at 11AM  Come for fasting blood work the week before appt

## 2012-11-02 NOTE — Assessment & Plan Note (Signed)
TC 291>251 (>goal < 200) TG 176> 135 (at goal < 150) HDL 77>86 ( at goal >40) LDL 150-121 ( >goal < 100) She states compliance with Zetia 10mg  daily Ms Landry has made improvements in her diet since our last visit.  She has decreased the fats, CHO and eggs she was eating - except for some sausage patties occassionally.   She has been making soups in her slow cooker most recently.  She has not been walking much d/t the weather but will restart as the weather improves.  She also likes to dance for exercise - she puts music on in her house and hip-hops, electric slide, jitter bug and wants to learn to [place]ble. We will see her back in 6 months

## 2012-11-02 NOTE — Progress Notes (Signed)
HPI:  80 yoF presents to clinic in good spirits for a follow up visit with [facility]id Clinic.  She was last seen in February 2014 At that time she was started on Zetia 10mg  daily.   She states she previously quit taking the Crestor due to muscle aches in her L arm and right thigh.  She states this is the same pain she has gotten with all statins.  She has a long history of noncompliance.   She states compliance with other meds but given history of non-compliance - see Sally's note from 05/2012, I am not sure.   Diet:  Pt's diet is poor.  She reports living alone and only eating 1-2 meals a day, with her first meal usually consisting of 2 pieces of toast, or sausage patty and eggs which she has changed to egg beaters or a hot dog.  She likes to drink water, milk 1 cup/day down from 1/2 gallon/day, cranberry juice, or sprite zero and coffee.   Her second meal is chicken with salad, turnip greens, collards, cabbage, peas, string beans, broccoli, or califlower.  She does snack between 12-2am most mornings on graham crackers and peanut butter.   Exercise:  Pt states she walk around her 2 acre property and does the yard work.  She also likes to hip hop dance at her home. She states [place] 1[place]2 [place] to the [place] and [facility] [place] her [place] which she walks to about 1 time week.     Current Outpatient Prescriptions  Medication Sig Dispense Refill  . amLODipine (NORVASC) 2.5 MG tablet Take 1 tablet (2.5 mg total) by mouth daily.  30 tablet  11  . amLODipine (NORVASC) 5 MG tablet TAKE 1 TABLET BY MOUTH EVERY DAY  30 tablet  6  . carvedilol (COREG) 12.5 MG tablet TAKE 1 & 1/2 TABLETS BY MOUTH 2 TIMES A DAY  90 tablet  6  . ezetimibe (ZETIA) 10 MG tablet Take 10 mg by mouth daily.      . insulin glargine (LANTUS SOLOSTAR) 100 UNIT/ML injection Inject 10 Units into the skin every morning. And pen needles 1/day  5 pen  PRN  . losartan (COZAAR) 100 MG tablet Take 1 tablet (100 mg total) by mouth daily.  30 tablet  11  .  PRECISION XTRA TEST STRIPS test strip TEST BLOOD SUGAR ONCE DAILY AS DIRECTED  100 each  5   No current facility-administered medications for this visit.   Allergies  Allergen Reactions  . Lisinopril     REACTION: INTOL to ACE's w/ cough  . Penicillins     REACTION: rash  . Statins     Muscle Aches

## 2012-11-07 ENCOUNTER — Telehealth: Payer: Self-pay | Admitting: Pulmonary Disease

## 2012-11-07 NOTE — Telephone Encounter (Signed)
Have you seen this form?  Thank you!

## 2012-11-08 NOTE — Telephone Encounter (Signed)
I called [name]. She is going to refax this to triage. Will await fax.

## 2012-11-08 NOTE — Telephone Encounter (Signed)
i have not seen this form on this pt.  thanks

## 2012-11-09 NOTE — Telephone Encounter (Signed)
Form has been completed and faxed back to [phone].  Placed in the scan folder to be scanned into the pts chart.

## 2012-11-09 NOTE — Telephone Encounter (Signed)
Leigh, did you receive the form?

## 2012-11-11 ENCOUNTER — Telehealth: Payer: Self-pay | Admitting: Pulmonary Disease

## 2012-11-11 NOTE — Telephone Encounter (Signed)
Marcellus Scott, CMA at 11/09/2012 11:16 AM   Status: Signed            Form has been completed and faxed back to [phone]. Placed in the scan folder to be scanned into the pts chart   ----  I called and spoke with Selena Batten. They never received the fax. They asked if we can re fax this. Marliss Czar is aware and will refax this over. Nothing further was eneded

## 2012-11-18 ENCOUNTER — Telehealth: Payer: Self-pay | Admitting: Pulmonary Disease

## 2012-11-18 NOTE — Telephone Encounter (Signed)
Call Documentation    Tommie Sams, CMA at 11/11/2012 10:55 AM    Status: Signed             Marcellus Scott, CMA at 11/09/2012 11:16 AM       Status:  Signed                  Form has been completed and faxed back to [phone]. Placed in the scan folder to be scanned into the pts chart      ----  I called and spoke with Selena Batten. They never received the fax. They asked if we can re fax this. Marliss Czar is aware and will refax this over. Nothing further was needed   ----- I called and spoke with [place]. I advised her we have faxed this over twice already. She stated she will let her supervisor know and she if they can find the fax. Nothing further was needed

## 2012-11-22 ENCOUNTER — Telehealth: Payer: Self-pay | Admitting: Pulmonary Disease

## 2012-11-22 NOTE — Telephone Encounter (Signed)
Form has been faxed back twice. This is scanned into pt chart now. I have re faxed this over to them AGAIN. Will sign off message

## 2012-11-30 ENCOUNTER — Other Ambulatory Visit (INDEPENDENT_AMBULATORY_CARE_PROVIDER_SITE_OTHER): Payer: Medicare Other

## 2012-11-30 ENCOUNTER — Encounter: Payer: Self-pay | Admitting: Pulmonary Disease

## 2012-11-30 ENCOUNTER — Ambulatory Visit (INDEPENDENT_AMBULATORY_CARE_PROVIDER_SITE_OTHER): Payer: Medicare Other | Admitting: Pulmonary Disease

## 2012-11-30 VITALS — BP 134/90 | HR 62 | Temp 97.6°F | Ht 62.0 in | Wt 127.8 lb

## 2012-11-30 DIAGNOSIS — I4891 Unspecified atrial fibrillation: Secondary | ICD-10-CM

## 2012-11-30 DIAGNOSIS — E7800 Pure hypercholesterolemia, unspecified: Secondary | ICD-10-CM

## 2012-11-30 DIAGNOSIS — Z860100 Personal history of colon polyps, unspecified: Secondary | ICD-10-CM | POA: Insufficient documentation

## 2012-11-30 DIAGNOSIS — K5730 Diverticulosis of large intestine without perforation or abscess without bleeding: Secondary | ICD-10-CM

## 2012-11-30 DIAGNOSIS — F411 Generalized anxiety disorder: Secondary | ICD-10-CM

## 2012-11-30 DIAGNOSIS — N259 Disorder resulting from impaired renal tubular function, unspecified: Secondary | ICD-10-CM

## 2012-11-30 DIAGNOSIS — K219 Gastro-esophageal reflux disease without esophagitis: Secondary | ICD-10-CM

## 2012-11-30 DIAGNOSIS — G589 Mononeuropathy, unspecified: Secondary | ICD-10-CM

## 2012-11-30 DIAGNOSIS — I119 Hypertensive heart disease without heart failure: Secondary | ICD-10-CM

## 2012-11-30 DIAGNOSIS — I10 Essential (primary) hypertension: Secondary | ICD-10-CM

## 2012-11-30 DIAGNOSIS — I2510 Atherosclerotic heart disease of native coronary artery without angina pectoris: Secondary | ICD-10-CM

## 2012-11-30 DIAGNOSIS — I43 Cardiomyopathy in diseases classified elsewhere: Secondary | ICD-10-CM

## 2012-11-30 DIAGNOSIS — M1990 Unspecified osteoarthritis, unspecified site: Secondary | ICD-10-CM

## 2012-11-30 DIAGNOSIS — N200 Calculus of kidney: Secondary | ICD-10-CM

## 2012-11-30 DIAGNOSIS — E1129 Type 2 diabetes mellitus with other diabetic kidney complication: Secondary | ICD-10-CM

## 2012-11-30 LAB — LDL CHOLESTEROL, DIRECT: Direct LDL: 155.7 mg/dL

## 2012-11-30 LAB — LIPID PANEL
Total CHOL/HDL Ratio: 4
Triglycerides: 190 mg/dL — ABNORMAL HIGH (ref 0.0–149.0)
VLDL: 38 mg/dL (ref 0.0–40.0)

## 2012-11-30 LAB — HEPATIC FUNCTION PANEL
AST: 17 U/L (ref 0–37)
Albumin: 3.8 g/dL (ref 3.5–5.2)
Total Bilirubin: 0.6 mg/dL (ref 0.3–1.2)

## 2012-11-30 NOTE — Patient Instructions (Addendum)
Today we updated your med list in our EPIC system...    Continue your current medications the same...  Call for any questions...  Let's plan a follow up visit in 6mo, sooner if needed for problems...   

## 2012-12-07 ENCOUNTER — Ambulatory Visit (INDEPENDENT_AMBULATORY_CARE_PROVIDER_SITE_OTHER): Payer: Medicare Other | Admitting: Endocrinology

## 2012-12-07 ENCOUNTER — Encounter: Payer: Self-pay | Admitting: Endocrinology

## 2012-12-07 VITALS — BP 142/82 | HR 73 | Wt 125.0 lb

## 2012-12-07 DIAGNOSIS — E1165 Type 2 diabetes mellitus with hyperglycemia: Secondary | ICD-10-CM

## 2012-12-07 DIAGNOSIS — E042 Nontoxic multinodular goiter: Secondary | ICD-10-CM

## 2012-12-07 MED ORDER — GLUCOSE BLOOD VI STRP: ORAL_STRIP | Status: DC

## 2012-12-07 NOTE — Progress Notes (Signed)
Subjective:    Patient ID: [name], female    DOB: 1927/10/20, 79 y.o.   MRN: [id number]  HPI Pt returns for f/u of type 2 DM (dx'ed 1992; complicated by TIA, CAD, peripheral sensory neuropathy, and renal insufficiency; oral rx has been limited by renal insufficiency, so she takes insulin; insulin therapy has been limited by pt's need for a simple regimen).  She was started on insulin in early 2014.  She lost her glucose meter, so she does not know how her cbg's are.  Past Medical History  Diagnosis Date  . Pulmonary embolism   . Hypertension   . CAD (coronary artery disease)   . Cardiomyopathy   . Palpitation   . Paroxysmal atrial fibrillation   . Hypercholesterolemia   . Diabetes mellitus   . Nontoxic multinodular goiter   . Pure hypercholesterolemia   . Peptic ulcer, unspecified site, unspecified as acute or chronic, without mention of hemorrhage, perforation, or obstruction   . GERD (gastroesophageal reflux disease)   . Benign neoplasm of adrenal gland   . Diverticulosis of colon (without mention of hemorrhage)   . Calculus of kidney   . 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 DJD (degenerative joint disease)   . Unspecified transient cerebral ischemia   . Anxiety   . Neuropathy   . Unspecified transient cerebral ischemia   . H. pylori infection     Past Surgical History  Procedure Laterality Date  . Knee surgery      left  . Shoulder surgery      right  . Cataract extraction      History   Social History  . Marital Status: Widowed    Spouse Name: N/A    Number of Children: 1  . Years of Education: N/A   Occupational History  . retired    Social History Main Topics  . Smoking status: Former Smoker -- 0.30 packs/day for 36 years    Types: Cigarettes    Quit date: 08/18/1983  . Smokeless tobacco: Never Used  . Alcohol Use: Yes     Comment: occ wine, beer during holidays or at social events  . Drug Use: No  .  Sexually Active: Not on file   Other Topics Concern  . Not on file   Social History Narrative  . No narrative on file    Current Outpatient Prescriptions on File Prior to Visit  Medication Sig Dispense Refill  . amLODipine (NORVASC) 2.5 MG tablet Take 1 tablet (2.5 mg total) by mouth daily.  30 tablet  11  . amLODipine (NORVASC) 5 MG tablet TAKE 1 TABLET BY MOUTH EVERY DAY  30 tablet  6  . carvedilol (COREG) 12.5 MG tablet TAKE 1 & 1/2 TABLETS BY MOUTH 2 TIMES A DAY  90 tablet  6  . ezetimibe (ZETIA) 10 MG tablet Take 10 mg by mouth daily.      Marland Kitchen losartan (COZAAR) 100 MG tablet Take 1 tablet (100 mg total) by mouth daily.  30 tablet  11   No current facility-administered medications on file prior to visit.    Allergies  Allergen Reactions  . Lisinopril     REACTION: INTOL to ACE's w/ cough  . Penicillins     REACTION: rash  . Statins     Muscle Aches    Family History  Problem Relation Age of Onset  . Diabetes Maternal Grandmother   . Heart disease Maternal Aunt  x2  . Lung cancer Mother   . Brain cancer Mother   . Goiter Mother   . Colon cancer Neg Hx     BP 142/82  Pulse 73  Wt 125 lb (56.7 kg)  BMI 22.86 kg/m2  SpO2 97%    Review of Systems /\denies hypoglycemia    Objective:   Physical Exam NECK: small multinodular goiter, (L>R)   Lab Results  Component Value Date   TSH 1.53 06/01/2012   Lab Results  Component Value Date   HGBA1C 10.2* 12/07/2012      Assessment & Plan:  DM: therapy limited by noncompliance.  i'll do the best i can.  needs increased rx Multinodular goiter, due for recheck

## 2012-12-07 NOTE — Patient Instructions (Addendum)
Please make a follow-up appointment in 3 months.  check your blood sugar twice a day.  vary the time of day when you check, between before the 3 meals, and at bedtime.  also check if you have symptoms of your blood sugar being too high or too low.  please keep a record of the readings and bring it to your next appointment here.  please call us sooner if you are having low blood sugar episodes.   Please continue the same insulin for now. blood tests are being requested for you today.  We'll contact you with results.  Let's recheck the thyroid ultrasound.  you will receive a phone call, about a day and time for an appointment

## 2012-12-12 ENCOUNTER — Other Ambulatory Visit: Payer: Self-pay | Admitting: Endocrinology

## 2012-12-12 ENCOUNTER — Ambulatory Visit
Admission: RE | Admit: 2012-12-12 | Discharge: 2012-12-12 | Disposition: A | Discharge status: Home / Self Care | Payer: Medicare Other | Source: Ambulatory Visit | Attending: Endocrinology | Admitting: Endocrinology

## 2012-12-12 DIAGNOSIS — E042 Nontoxic multinodular goiter: Secondary | ICD-10-CM

## 2012-12-12 MED ORDER — ACARBOSE 25 MG PO TABS: 25.0000 mg | ORAL_TABLET | Freq: Three times a day (TID) | ORAL | Status: DC

## 2012-12-13 ENCOUNTER — Telehealth: Payer: Self-pay | Admitting: Endocrinology

## 2012-12-13 NOTE — Telephone Encounter (Signed)
Pt requests r/s regarding thyroid u/s. CB# [phone] / Sherri S.

## 2012-12-13 NOTE — Telephone Encounter (Signed)
Pt advised results not back yet. 

## 2012-12-19 ENCOUNTER — Telehealth: Payer: Self-pay

## 2012-12-19 NOTE — Telephone Encounter (Signed)
Pt advised and states an understanding 

## 2012-12-19 NOTE — Telephone Encounter (Signed)
Pt would like to get ultrasound results from 12/08/12

## 2012-12-19 NOTE — Telephone Encounter (Signed)
Thyroid is slightly smaller--good.

## 2012-12-20 ENCOUNTER — Ambulatory Visit: Payer: Medicare Other | Admitting: Endocrinology

## 2013-01-09 ENCOUNTER — Encounter: Payer: Self-pay | Admitting: Pulmonary Disease

## 2013-01-09 NOTE — Progress Notes (Deleted)
Patient ID: BRENT NOTO, female   DOB: [birth year], 79 y.o.   MRN: [id number]

## 2013-01-09 NOTE — Progress Notes (Signed)
Subjective:    Patient ID: [name], female    DOB: September 17, 1927, 79 y.o.   MRN: [id number]  HPI 79 y/o BF here for a follow up visit... he has multiple medical problems as noted below...    ~  June 15, 2011:  67mo ROV> she tells me that she fell about a month ago & "pulled the tendons in my knee" per DrACollins, treated w/ Hydrocodone 10mg  per Ortho;  BP appears poorly controlled largely due to poor med compliance- she has once again failed to bring her med bottle or her med list to the OV & she is encouraged to bring all bottle toevery doctor visit for review;  In addition she has stopped her statin med (Prev40) & her FLP today shows HKVQQ595 & LDL 170> we will refer to the [facility] for help;  She is overdue for a follow up visit w/ Cards & will call DrHochrein's office for appt; See prob list below:    >She saw DrEllison 9/12 & her A1c= 9.6 on Amaryl monotherapy> he felt her poor control was due to noncompliance (she states intol to Metformin, won't take Actos "due to my heart" & stopped Januvia on her own);  She requested that we do labs and Bs=206, A1c=8.9 & she is asked to f/u w/ her Diabetic specialist to discuss the options left open to her for better control of her DM...    >She saw DrStark 5/12 for sm vol hematochezia, known divertics on CT Abd in 2006, hx prev FlexSig so he performed colonoscopy 5/12 w/mod divertics in sigmoid region & 2 polyps removed (20mm & 12mm), path= tubulovillous adenomas & repeat colon planned ?42yrs per DrStark...  ~  December 01, 2011:  5-267mo ROV & [name] will be 62 in a few days; she has been doing well she says "just the usual aches & pains" but she notes that she "fell over the dog" w/ torn lig in knee> went to ER & f/u w/ DrACarter- given brace & conservative Rx, improved;  She tells me she is "tired of medications" & this is a bad sign usually meaning that she has stopped several meds etc (eg- she is not taking the Prav40);  BP appears ok on her 3 meds below &  she denies CP, palpit, SOB, edema, etc;  DM regulated by DrEllison for Endocrine on below but recent A1c= 9.4 & his note of 3/13 is reviewed...  See prob list below>>  ~  May 31, 2012:  67mo ROV & [name] reports a good 67mo interval- denies any new complaints or concerns...    HBP> on Coreg12.5gm-1.5tabsBid, Norvasc5+2.5 & Cozaar100; BP= 138/80 & she denies CP, palpit, SOB, edema...    CAD, Cardiomyop, PAF> followed by DrHochrein & seen 9/13- Hx HBP, nonobstructive CAD, & nonischemic cardiomyop; Myoview showed no evid scar or ischemia, EF+44%, HK in septum & infer wall; he incr the Norvasc to 7.5mg /d.    Chol> refuses meds, ?on diet; FLP showed TChol 254, TG 189, HDL 75, LDL 154 and she continues to refuse med rx, referred to [facility].    DM> on Glimep2, Januv100, Parlodel2.5 per DrEllison (last seen 9/13); ?if taking regularly, she has refused additional meds or insulin; she says "meds can lead to other disorders- maybe I should stop reading"; labs showed BS=161, A1c=9.1; Rec to take meds every day & f/u w/ DrEllison...    Multinod Thyroid> gland feels unchanged & TSH=1.53; Sonar 3/11 showed mult nodules, diffuse inhomogeneous echotexture & dominant left  loer pole nodule- Bx= hyperplastic nodule...    Renal Insuffic> mild prob w/ Creat= 1.3 & we are following... We reviewed prob list, meds, xrays and labs> see below for updates >> she declines the Flu vaccine... LABS 10/13:  FLP- not at goals on diet alone;  Chems- ok x BS=161 A1c=9.1;  CBC- wnl;  TSH=1.53   ~  November 30, 2012:  44mo ROV & [name] is c/o some leg cramps, dry mouth, itchy eyes, and stress dealing w/ a 37 y/o aunt!  She notes that mustard, vinegar water, & tonic water all help the leg cramps; Zyrtek, Benedryl etc help the eye symptoms; and she is dealing w/ her aunt (declines offer for anxiolytic therapy... We reviewed the following medical problems during today's office visit >>     HBP> on Coreg12.5gm-1.5tabsBid, Norvasc5+2.5 &  Cozaar100; BP= 134/90 & she denies CP, palpit, SOB, edema; reminded to elim salt & take meds every day...    CAD, Cardiomyop, PAF> followed by DrHochrein & seen 9/13- Hx HBP, nonobstructive CAD, & nonischemic cardiomyop; Myoview showed no evid scar or ischemia, EF+44%, HK in septum & infer wall; he incr the Norvasc to 7.5mg /d...    Chol> on Zetia10 now from [facility], ?on diet; FLP 3/14 showed TChol 251, TG 135, HDL 87, LDL 121; she has hx INTOL to all statins, referred to [facility] & seen by [facility]...    DM> on Lantus10u now; off all oral meds per DrEllison; she says "meds can lead to other disorders- maybe I should stop reading"; last A1c in 1/14 was 8.4; she declines f/u labs today wants to wait for DrEllison appt.    Multinod Thyroid> gland feels unchanged & TSH 10/13 was1.53; Sonar 3/11 showed mult nodules, diffuse inhomogeneous echotexture & dominant left loer pole nodule- Bx= hyperplastic nodule...    Renal Insuffic> mild prob w/ Creat= 1.3 range & we are following... We reviewed prob list, meds, xrays and labs> see below for updates >>           Problem List:      Hx of PULMONARY EMBOLISM (ICD-415.19) - presented w/ pleuritic right CP 7/10 w/ CT angio showing pulm emboli & Rx w/ hep> coumadin... protimes were followed w/ the [facility] office protocol... ~  hosp by [facility] 9/10 for CP- she ruled out for MI & had f/u CT Angio- NEG for emboli (prev emboli resolved), & VenDopplers- NEG for DVT... ~  Protime 11/10= 49.4/ INR 4.8 (on Coumadin 5mg /d) & she's hi risk for bleed w/ hx DU & AVM- therefore coumadin stopped. ~  1/11:  Coumadin restarted by DrHochrein for PAF on Holter and elevated CHADS2 score, note: she is high risk for coumadin due to non-compliance, hx DU w/ bleed, & hx AVM in brain w/ [facility] in 2001. ~  4/12:  Coumadin stopped during the 4/12 hospitalization by [facility] in consult w/ Cards- DrHochrein (Disch summary reviewed). ~  CXR 4/12 showed cardiomeg, clear lungs, NAD.Marland KitchenMarland Kitchen ~  CT Angio Chest 4/12  showed no PE, scat atheroclerotic calcif in Ao & coronaries, basilar atx, DJD spine...  HYPERTENSION (ICD-401.9) - long hx of signif HBP w/ hosp for [facility] in 2001 by DrKritzer (due to an AVM)> on COREG 12.5mg - 1.5tabsBid, COZAAR 100mg /d, & AMLODIPINE 7.5mg /d;  previously on Toprol XL & Lotrel- developed an ACE cough; long hx non-compliance w/ med Rx- she prefers garlic and lemon juice!!!  ~  4/10:  BP= 160/90 today, pt supposed to be on Bisoprolol 2.5mg  daily, and add Losartan  100mg /d. ~  7/10:  BP= 140/80 on Ziac2.5 + Cozaar100... reminded to take meds every day!!! ~  9/10:  BP= 132/82 on Ziac2.5 + Cozaar 100... continue same! ~  10/10: Hosp by [facility] and meds changed- COREG 3.125Bid + COZAAR100- 1/2 tab/d...  ~  11/10:  BP today= 146/90 & states she doesn't feel well- rec incr Cozaar back to 100mg /d. ~  2/11:  BP= 154/90 & supposed to be on Coreg 6.25Bid & Losartan 100mg /d- Coreg incr to 12.5Bid. ~  9/11:  BP= 150/90 & pt reminded to take meds every day! ~  11/11:  DrHochrein up-titrated Coreg to 18.75mg  Bid, +Cozaar 100mg /d, BP= 142/84> ?compliance. ~  4/12:  BP= 132/76 & stable, continue same meds & remember to bring all meds to the office visits... ~  10/12:  BP= 160/88 & I suspect noncompliance w/ meds; encouraged to take all meds every day & bring bottles to every visit. ~  4/13:  BP= 142/80 (on Coreg, Norvasc, Cozaar), & she remains asymptomatic; denies CP, palpit, SOB, edema, etc... ~  10/13:  BP= 138/80 (on Coreg18.75, Norvasc7.5, Cozaar100) & she remains asymptomatic... ~  4/14: on Coreg12.5gm-1.5tabsBid, Norvasc5+2.5 & Cozaar100; BP= 134/90 & she denies CP, palpit, SOB, edema; reminded to elim salt & take meds every day.  CORONARY ARTERY DISEASE (ICD-414.00) & CARDIOMYOPATHY (ICD-425.4) - followed by DrHochrein for Cards> she is ACE intolerant w/ cough, and has long hx of non-compliance w/ med Rx. ~  abn EKG w/ LBBB... ~  2DEcho 3/10 showed diffuse LV HK w/ EF~25%, mild calcif AoV, mild  MR, mod dil LA & atrial septal aneurysm. ~  NuclearStressTest 2/10 showed cardiomyop w/ diffuse HK & EF~35%. ~  Cath 11/15/08 w/ EF= 30-35%, & non-obstructive CAD w/ 30-40% midLAD, 40-70% Diag branch of LAD, calcif in proxCIRC, & 30% midRCA. ~  Repeat 2DECho 7/10 showed infer AK, septal dysynergy, atrial septal aneurym, EF= 30-35%... ~  Hosp 10/10 by Cards & r/o for MI, meds adjusted as noted. ~  4/12:  Hosp by [facility] w/ atyp CP & r/o for MI> disch for Cards f/u and any further out pt work up... ~  9/13:  She had f/u DrHochrein & EKG showed SBrady, rate54, IVCD, LAD; he increased her Norvasc to 7.5mg /d. ~  Myoview 9/13 showed no evid scar or ischemia, EF+44%, HK in septum & infer wall...  PAROXYSMAL ATRIAL FIBRILLATION (ICD-427.31) - eval for palpit by DrHochrein w/ PAF on Holter> he ordered Coumadin restarted 2/11 ==> it was discontinued again 4/12 Hosp...  HYPERCHOLESTEROLEMIA (ICD-272.0) - she refused meds in past & preferred Rx w/ red yeast rice, oatmeal, herbal remedies. ~  FLP 11/07 showed TChol 285, TG 155, HDL 80, LDL 165... obviously not taking med. ~  FLP 10/09 showed TChol 294, TG 205, HDL 78, LDL 157... she does not want to start statin Rx. ~  Discussed starting Rx in light of her cardiomyopathy & CAD on cath- she refuses statin Rx. ~  FLP 4/10 on diet showed TChol 211, TG 48, HDL 94, LDL 98... rec> keep up the great work! ~  FLP 2/11 on diet showed TChol 278, TG 303, HDL 74, LDL 154... rec> try PRAVACHOL40. ~  pt admits to taking the Prav40 "only now & again"> asked to take it daily. ~  FLP 1/12 on Prav40 showed TChol 198, TG 175, HDL 71, LDL 92 ~  FLP 10/12 off meds showed TChol 273, TG 151, HDL 79, LDL 170 (goal<70) & she was referred to Lipid  Clinic. ~  11/12: she established w/ the Lipid Clinic & told them the only med she took regularly was the Coreg; diet noted to be poor, & counseled appropriately, asked to restart PRAV40- MWF but she never followed up w/ them... ~  4/13:  Pt  advised on diet, exercise, & asked to restart PRAV40 as requested by Lipid Clinic; she was asked to sched f/u in the clinic to help lipid management... ~  FLP 10/13 showed TChol 254, TG 189, HDL 75, LDL 154... She is encouraged to f/u w/ LC... ~  Lipid Clinic started [facility] & has counseled her on diet as well...  DIABETES MELLITUS (ICD-250.00) - long hx of chronic non-compliance w/ med Rx ~  labs 10/09 showed BS= 319, HgA1c= 11.8.Marland KitchenMarland Kitchen re-started Metformin 500mg -2Bid + Glimepiride 4mg /d, but she c/o abd pain on the Metformin and stopped the Glimepiride on her own... referred to Endocrine. ~  DrEllison opted for Rx w/ ACTOS 45mg /d, & GLIMEPIRIDE 1mg - 1/2 tabAM (intol Metformin). ~  labs 4/10 showed BS= 102, A1c= 6.9.Marland KitchenMarland Kitchen continue same. ~  7/10: regulated in hosp w/ GLUCOPHAGE 500- 1/2Bid + GLIMEPIRIDE 1mg /d... labs showed BS= 144, A1c= 7.4. ~  9/10: labs showed BS= 270, A1c= 7.3.Marland KitchenMarland Kitchen prev intol to Metform500, therefore incr GLIMEPIRIDE 2mg Qam (she refused). ~  11/10: Bs= 201, A1c= 8.4.Marland KitchenMarland Kitchen rec try to incr Metform500Bid & Glimep2mg /d... ~  labs 2/11 showed BS= 188, A1c= 9.0 (hasn't filled Metform in months & ?taking Glimep1mg ?). ~  labs 6/11 showed BS= 238, A1c=9.4 (Pharm confirms not taking meds regularly)... rec> take meds daily!  NOTE: Creat=1.2, Umicroalb=pos. ~  followed by DrEllison every 3 months- he tried to add Januvia, she never filled the Rx. ~  labs 1/12 showed BS= 122, A1c= 8.5.Marland KitchenMarland Kitchen rec to take Metform500Bid, Glimep2mg /d, Januvia100mg /d. ~  4/12 Hosp showed BS= 111-310 & A1c= 7.6; disch back on Metform500Bid, Amaryl 2mg , ?Januvia100 (?if taking)... ~  Labs 10/12 showed BS= 206, A1c= 8.9 ?on Amaryl monotherapy> poor control & encouraged to f/u w/ DrEllison/ DM management. ~  4/13:  We reviewed her DM mamagement under the direction of DrEllison; recent A1c=9.4 & compliance w/ diet & meds is the major issue> discussed w/ pt. ~  10/13:  Supposed to be on Glimep2, Januv100, Parlodel2.5- BS=161, A1c=9.1  and she will sched f/u w/ DrEllison... ~  DrEllison has stopped oral meds and switched to LANTUS insulin...  GERD (ICD-530.81) - previously on herbal remedies w/ prev eval from [facility] & DrNat-Mann in 1998...  ~  [facility] 6/10 w/ hematemesis & UGI bleed from DU +HPylori... eval by [facility] & Rx w/ OMPEPRAZOLE 40mg  & PYLERA (broken down to the generic Bismuth, Metronidazole, Tetracycline- she is Penicillin allergic) ~  GI f/u DrStark 9/10- HPylori eradicated, continue Omep40mg /d...  DIVERTICULOSIS OF COLON (ICD-562.10)  COLON POLYPS  ~  last procedure= FlexSig 1998 by DrStark showing divertics only... 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  NEPHROLITHIASIS & Mild RENAL INSUFFIC >> ~  Her renal function is normal w/ Creat = 1.1 - 1.2 range... ~  Labs 10/13 showed BUN= 23, Creat= 1.3  DEGENERATIVE JOINT DISEASE (ICD-715.90) - uses VICODIN Prn... ~  10/12:  She noted fall w/ ?ligament damage in right knee & treated by DrACarter  w/ Vicodin...  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 sudden HA- eval revealed AVM & conservative approach recommended by Neurosurgery w/ BP control... ~  She has been reminded on numerous occas about the importance of BP control, taking meds everyday to prevent recurrent ICH, stroke, etc...  NEUROPATHY (ICD-355.9)  ANXIETY (ICD-300.00)   Past Surgical History  Procedure Laterality Date  . Knee surgery      left  . Shoulder surgery      right  . Cataract extraction      Outpatient Encounter Prescriptions as of 11/30/2012  Medication Sig Dispense  Refill  . amLODipine (NORVASC) 2.5 MG tablet Take 1 tablet (2.5 mg total) by mouth daily.  30 tablet  11  . amLODipine (NORVASC) 5 MG tablet TAKE 1 TABLET BY MOUTH EVERY DAY  30 tablet  6  . carvedilol (COREG) 12.5 MG tablet TAKE 1 & 1/2 TABLETS BY MOUTH 2 TIMES A DAY  90 tablet  6  . ezetimibe (ZETIA) 10 MG tablet Take 10 mg by mouth daily.      Marland Kitchen losartan (COZAAR) 100 MG tablet Take 1 tablet (100 mg total) by mouth daily.  30 tablet  11  . [DISCONTINUED] insulin glargine (LANTUS SOLOSTAR) 100 UNIT/ML injection Inject 10 Units into the skin every morning. And pen needles 1/day  5 pen  PRN  . [DISCONTINUED] PRECISION XTRA TEST STRIPS test strip TEST BLOOD SUGAR ONCE DAILY AS DIRECTED  100 each  5   No facility-administered encounter medications on file as of 11/30/2012.    Allergies  Allergen Reactions  . Lisinopril     REACTION: INTOL to ACE's w/ cough  . Penicillins     REACTION: rash  . Statins     Muscle Aches    Current Medications, Allergies, Past Medical History, Past Surgical History, Family History, and Social History were reviewed in [employer] record.   Review of Systems         See HPI - all other systems neg except as noted... The patient complains of dyspnea on exertion.  The patient denies anorexia, fever, weight loss, weight gain, vision loss, decreased hearing, hoarseness, chest pain, syncope, peripheral edema, prolonged cough, headaches, hemoptysis, abdominal pain, melena, hematochezia, severe indigestion/heartburn, hematuria, incontinence, muscle weakness, suspicious skin lesions, transient blindness, difficulty walking, depression, unusual weight change, abnormal bleeding, enlarged lymph nodes, and angioedema.     Objective:   Physical Exam      WD, WN, 79 y/o BF in NAD... GENERAL:  Alert & oriented; pleasant & cooperative... HEENT:  /AT, EOM-wnl, PERRLA, EACs-clear, TMs-wnl, NOSE-clear, THROAT-clear & wnl. NECK:  Supple w/ fairROM; no  JVD; normal carotid impulses w/o bruits; no thyromegaly but sm nodule palpated; no lymphadenopathy. CHEST:  Clear to P & A; without wheezes/ rales/ or rhonchi. HEART:  Regular Rhythm; gr 1/6 SEM, no rubs or gallops heard... ABDOMEN:  Soft & nontender; normal bowel sounds; no organomegaly or masses detected. EXT: without deformities, mild arthritic changes; no varicose veins/ venous insuffic/ or edema. NEURO:  CN's intact; motor testing normal; sensory testing normal; gait normal & balance OK. DERM:  No lesions noted; no rash etc...  RADIOLOGY DATA:  Reviewed in the EPIC EMR & discussed w/ the patient...  LABORATORY DATA:  Reviewed in the EPIC EMR & discussed w/ the patient...   Assessment & Plan:  CP, known CAD>  She was adm 4/12 w/ atypCP & ruled out, likely musculoskeletal, TH service consulted DrMcLean & contacted DrHochrein who indicated out-pt work up & agreed w/ stopping her Coumadin; she saw DrHochrein 9/13 w/ Myoview as above; on Coreg, Norvasc, Cozaar...  Hx PE in 2010>  She had neg CT Angio 4/12 hosp & her coumadin was stopped...  HBP>  Poorly controlled due to poor medication compliance- she is encouraged to take all meds everyday & fill her prescriptions every month; she is reminded to bring meds to each & every visit...  HxPAF>  She has maintained NSR and is followed by DrHochrein...  CHOL>  she was referred to the Lipid Clinic for their help & started on Zetia10...  DM>  Followed by DrEllison & her DM control is poor due to medication noncompliance & perceived side effects; he switched her to Lantus insulin w/ control...  Multinodular thyroid>  Clinically & biochem euthyroid...  GI>  Stable on her herbal remedies that she likes so much...  Renal Insuffic>  Creat= 1.3 at present...  HxAVM & TIA>  Aware, she denies cerebral ischemic symptoms; she is reminded of the critically important aspect of BP control to avoid ICH w/ her AVM.Marland KitchenMarland Kitchen   Patient's Medications  New  Prescriptions   ACARBOSE (PRECOSE) 25 MG TABLET    Take 1 tablet (25 mg total) by mouth 3 (three) times daily with meals.  Previous Medications   AMLODIPINE (NORVASC) 2.5 MG TABLET    Take 1 tablet (2.5 mg total) by mouth daily.   AMLODIPINE (NORVASC) 5 MG TABLET    TAKE 1 TABLET BY MOUTH EVERY DAY   CARVEDILOL (COREG) 12.5 MG TABLET    TAKE 1 & 1/2 TABLETS BY MOUTH 2 TIMES A DAY   EZETIMIBE (ZETIA) 10 MG TABLET    Take 10 mg by mouth daily.   LOSARTAN (COZAAR) 100 MG TABLET    Take 1 tablet (100 mg total) by mouth daily.  Modified Medications   Modified Medication Previous Medication   GLUCOSE BLOOD (ONETOUCH VERIO) TEST STRIP glucose blood (ONETOUCH VERIO) test strip      And lancets 2/day 250.41    1 each by Other route 2 (two) times daily. And lancets 2/day 250.41   INSULIN GLARGINE (LANTUS) 100 UNIT/ML INJECTION insulin glargine (LANTUS SOLOSTAR) 100 UNIT/ML injection      Inject 20 Units into the skin every morning. And pen needles 1/day    Inject 10 Units into the skin every morning. And pen needles 1/day  Discontinued Medications   PRECISION XTRA TEST STRIPS TEST STRIP    TEST BLOOD SUGAR ONCE DAILY AS DIRECTED

## 2013-01-12 ENCOUNTER — Telehealth: Payer: Self-pay | Admitting: Cardiology

## 2013-01-12 MED ORDER — AMLODIPINE BESYLATE 2.5 MG PO TABS: 2.5000 mg | ORAL_TABLET | Freq: Every day | ORAL | Status: DC

## 2013-01-12 NOTE — Telephone Encounter (Signed)
New Prob     Requesting new prescription of AMLODIPINE.

## 2013-01-12 NOTE — Telephone Encounter (Signed)
rx sent into pharmacy

## 2013-01-13 ENCOUNTER — Encounter: Payer: Self-pay | Admitting: Gastroenterology

## 2013-01-20 ENCOUNTER — Telehealth: Payer: Self-pay | Admitting: Cardiology

## 2013-01-20 NOTE — Telephone Encounter (Signed)
Spoke with pt who states she is going on a cruise and wants to know what to do.  Advised pt to take all of her meds with her in the bottles they came in.  Make sure she keep her feet and legs elevated as much as possible to prevent edema.  She is to watch her NA intake as much as possible and not add any salt to her food.  She should see the MD on the ship should she have any problems.  Pt states understanding.

## 2013-01-20 NOTE — Telephone Encounter (Signed)
New problem    Question about what she needs to do if a situation comes up because she's going on a cruise

## 2013-03-01 ENCOUNTER — Ambulatory Visit (INDEPENDENT_AMBULATORY_CARE_PROVIDER_SITE_OTHER): Payer: Medicare Other | Admitting: Endocrinology

## 2013-03-01 ENCOUNTER — Encounter: Payer: Self-pay | Admitting: Endocrinology

## 2013-03-01 VITALS — BP 140/80 | HR 62 | Temp 97.8°F | Resp 12 | Wt 124.0 lb

## 2013-03-01 DIAGNOSIS — E1165 Type 2 diabetes mellitus with hyperglycemia: Secondary | ICD-10-CM

## 2013-03-01 LAB — HEMOGLOBIN A1C: Hgb A1c MFr Bld: 12.5 % — ABNORMAL HIGH (ref 4.6–6.5)

## 2013-03-01 NOTE — Patient Instructions (Addendum)
Please make a follow-up appointment in 3 months.  check your blood sugar twice a day.  vary the time of day when you check, between before the 3 meals, and at bedtime.  also check if you have symptoms of your blood sugar being too high or too low.  please keep a record of the readings and bring it to your next appointment here.  please call us sooner if you are having low blood sugar episodes.  blood tests are being requested for you today.  We'll contact you with results.  Please increase your insulin to 30 units each morning.

## 2013-03-01 NOTE — Progress Notes (Signed)
Subjective:    Patient ID: [name], female    DOB: 03-23-1928, 79 y.o.   MRN: [id number]  HPI Pt returns for f/u of type 2 DM (dx'ed 1992; she has moderate neuropathy of the lower extremities; she has associated TIA, CAD, and renal insufficiency; oral rx has been limited by renal insufficiency, so she takes insulin; insulin therapy has been limited by pt's need for a simple regimen).  She was started on insulin in early 2014.  no cbg record, but states cbg's vary from 200-30.  There is no trend throughout the day. Past Medical History  Diagnosis Date  . Pulmonary embolism   . Hypertension   . CAD (coronary artery disease)   . Cardiomyopathy   . Palpitation   . Paroxysmal atrial fibrillation   . Hypercholesterolemia   . Diabetes mellitus   . Nontoxic multinodular goiter   . Pure hypercholesterolemia   . Peptic ulcer, unspecified site, unspecified as acute or chronic, without mention of hemorrhage, perforation, or obstruction   . GERD (gastroesophageal reflux disease)   . Benign neoplasm of adrenal gland   . Diverticulosis of colon (without mention of hemorrhage)   . Calculus of kidney   . 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 DJD (degenerative joint disease)   . Unspecified transient cerebral ischemia   . Anxiety   . Neuropathy   . Unspecified transient cerebral ischemia   . H. pylori infection     Past Surgical History  Procedure Laterality Date  . Knee surgery      left  . Shoulder surgery      right  . Cataract extraction      History   Social History  . Marital Status: Widowed    Spouse Name: N/A    Number of Children: 1  . Years of Education: N/A   Occupational History  . retired    Social History Main Topics  . Smoking status: Former Smoker -- 0.30 packs/day for 36 years    Types: Cigarettes    Quit date: 08/18/1983  . Smokeless tobacco: Never Used  . Alcohol Use: Yes     Comment: occ wine, beer during  holidays or at social events  . Drug Use: No  . Sexually Active: Not on file   Other Topics Concern  . Not on file   Social History Narrative  . No narrative on file    Current Outpatient Prescriptions on File Prior to Visit  Medication Sig Dispense Refill  . acarbose (PRECOSE) 25 MG tablet Take 1 tablet (25 mg total) by mouth 3 (three) times daily with meals.  90 tablet  11  . amLODipine (NORVASC) 2.5 MG tablet Take 1 tablet (2.5 mg total) by mouth daily.  30 tablet  11  . amLODipine (NORVASC) 5 MG tablet TAKE 1 TABLET BY MOUTH EVERY DAY  30 tablet  6  . carvedilol (COREG) 12.5 MG tablet TAKE 1 & 1/2 TABLETS BY MOUTH 2 TIMES A DAY  90 tablet  6  . ezetimibe (ZETIA) 10 MG tablet Take 10 mg by mouth daily.      Marland Kitchen glucose blood (ONETOUCH VERIO) test strip And lancets 2/day 250.41  100 each  3  . insulin glargine (LANTUS) 100 UNIT/ML injection Inject 30 Units into the skin every morning. And pen needles 1/day      . losartan (COZAAR) 100 MG tablet Take 1 tablet (100 mg total) by mouth daily.  30 tablet  11   No current facility-administered medications on file prior to visit.    Allergies  Allergen Reactions  . Lisinopril     REACTION: INTOL to ACE's w/ cough  . Penicillins     REACTION: rash  . Statins     Muscle Aches    Family History  Problem Relation Age of Onset  . Diabetes Maternal Grandmother   . Heart disease Maternal Aunt     x2  . Lung cancer Mother   . Brain cancer Mother   . Goiter Mother   . Colon cancer Neg Hx     BP 140/80  Pulse 62  Temp(Src) 97.8 F (36.6 C) (Oral)  Resp 12  Wt 124 lb (56.246 kg)  BMI 22.67 kg/m2  SpO2 97%    Review of Systems denies hypoglycemia and weight change    Objective:   Physical Exam VITAL SIGNS:  See vs page GENERAL: no distress    Lab Results  Component Value Date   HGBA1C 12.5* 03/01/2013      Assessment & Plan:  DM: This insulin regimen was chosen from multiple options, for its simplicity.  The  benefits of glycemic control must be weighed against the risks of hypoglycemia.  She needs increased rx

## 2013-03-22 ENCOUNTER — Other Ambulatory Visit: Payer: Self-pay

## 2013-04-11 ENCOUNTER — Other Ambulatory Visit: Payer: Self-pay

## 2013-04-11 MED ORDER — INSULIN GLARGINE 100 UNIT/ML ~~LOC~~ SOLN: 30.0000 [IU] | SUBCUTANEOUS | Status: DC

## 2013-04-26 ENCOUNTER — Other Ambulatory Visit: Payer: Self-pay | Admitting: Pharmacist

## 2013-04-26 ENCOUNTER — Other Ambulatory Visit: Payer: Medicare Other

## 2013-04-26 ENCOUNTER — Other Ambulatory Visit (INDEPENDENT_AMBULATORY_CARE_PROVIDER_SITE_OTHER): Payer: Medicare Other

## 2013-04-26 DIAGNOSIS — E785 Hyperlipidemia, unspecified: Secondary | ICD-10-CM

## 2013-04-26 LAB — HEPATIC FUNCTION PANEL
Albumin: 3.8 g/dL (ref 3.5–5.2)
Bilirubin, Direct: 0 mg/dL (ref 0.0–0.3)
Total Protein: 7.2 g/dL (ref 6.0–8.3)

## 2013-04-26 LAB — LIPID PANEL
HDL: 75 mg/dL (ref >39.00)
Total CHOL/HDL Ratio: 3
VLDL: 48.4 mg/dL — ABNORMAL HIGH (ref 0.0–40.0)

## 2013-05-02 ENCOUNTER — Ambulatory Visit (INDEPENDENT_AMBULATORY_CARE_PROVIDER_SITE_OTHER): Payer: Medicare Other | Admitting: Pharmacist

## 2013-05-02 DIAGNOSIS — E1129 Type 2 diabetes mellitus with other diabetic kidney complication: Secondary | ICD-10-CM

## 2013-05-02 DIAGNOSIS — E7800 Pure hypercholesterolemia, unspecified: Secondary | ICD-10-CM

## 2013-05-02 NOTE — Assessment & Plan Note (Addendum)
Patient's previous A1c is not at goal and patient reports blood sugars that are elevated.  Her uncontrolled blood sugars are possibly secondary to noncompliance with Lantus.  Today we encouraged her to take her Lantus as prescribed and watch her carbohydrate intake.

## 2013-05-02 NOTE — Progress Notes (Signed)
HPI:  71 yoF presents to clinic in good spirits for a follow up visit with [facility]. In Feb 2014 she was started on Zetia 10mg  daily. She has previously discontinued Crestor; stating that all statins cause her muscle pains.  She has a long history of noncompliance.  She has been off her Zetia since July when she ran out of medication. She also states she has only been taking her Lantus 3-4 times per week.     Diet:  Patient has tried to improve diet, but states that she is constantly hungry if she eats what we tell her.  She eats eggs for breakfast with deer sausage.  She admits to snacking between 12-2am most mornings on peanut butter, cabbage, or a bowl of cheerios with fruit. She does like noodles, but has tried to cut back.  She has also changed to sherbet instead of ice cream and whole wheat instead of white bread. She drinks a lot of milk (~20 oz/per day) and tonic water. She also states she has a glass of wine or can of beer occasionally.   Exercise:  Patient states that she tries to stay active, but as she gets older it is harder for her.     Current Outpatient Prescriptions  Medication Sig Dispense Refill  . amLODipine (NORVASC) 2.5 MG tablet Take 1 tablet (2.5 mg total) by mouth daily.  30 tablet  11  . amLODipine (NORVASC) 5 MG tablet TAKE 1 TABLET BY MOUTH EVERY DAY  30 tablet  6  . carvedilol (COREG) 12.5 MG tablet TAKE 1 & 1/2 TABLETS BY MOUTH 2 TIMES A DAY  90 tablet  6  . ezetimibe (ZETIA) 10 MG tablet Take 10 mg by mouth daily.      Marland Kitchen glucose blood (ONETOUCH VERIO) test strip And lancets 2/day 250.41  100 each  3  . insulin glargine (LANTUS) 100 UNIT/ML injection Inject 0.3 mLs (30 Units total) into the skin every morning. And pen needles 1/day  10 mL  6  . losartan (COZAAR) 100 MG tablet Take 1 tablet (100 mg total) by mouth daily.  30 tablet  11   No current facility-administered medications for this visit.   Allergies  Allergen Reactions  . Lisinopril     REACTION: INTOL  to ACE's w/ cough  . Penicillins     REACTION: rash  . Statins     Muscle Aches

## 2013-05-02 NOTE — Assessment & Plan Note (Signed)
Cholesterol is not at goal possibly secondary to noncompliance with Zetia and increased blood sugars.  Today we encouraged her to take her medications as prescribed. We will call her when we receive samples of Zetia.  She was instructed to call the clinic the next time she runs out of Zetia.

## 2013-05-05 NOTE — Patient Instructions (Signed)
We will call you once we have some Zetia samples.    Follow up with Dr. Kriste Basque for further management.

## 2013-05-23 ENCOUNTER — Other Ambulatory Visit: Payer: Self-pay | Admitting: Pulmonary Disease

## 2013-05-24 ENCOUNTER — Telehealth: Payer: Self-pay | Admitting: Pulmonary Disease

## 2013-05-24 NOTE — Telephone Encounter (Signed)
Per SN: take OTC mucinex 600 mg 2 po BID w/ plenty of fluids, otc delsym 2 tsp BID, MMW 1 tsp gargle and swallow QID PRN #4 OZ 0 refills  ATC pt home # to make aware and line just rang several times. NO VM WCB Called mobile # and LMTCB x1

## 2013-05-24 NOTE — Telephone Encounter (Signed)
Pt states she has had sinus congestion and PND, and productive cough with white phlegm. Pt states she does not feel like she has any congestion in her chest, it feels like it is in her throat. Pt denies any fever, SOB, wheezing at this time. Pt is taking an OTC cough and cold medication without any relief. While on the phone the pt is was clearing her throat a lot. Please advise. Carron Curie, CMA Allergies  Allergen Reactions  . Lisinopril     REACTION: INTOL to ACE's w/ cough  . Penicillins     REACTION: rash  . Statins     Muscle Aches

## 2013-05-25 MED ORDER — FIRST-DUKES MOUTHWASH MT SUSP: OROMUCOSAL | Status: DC

## 2013-05-25 NOTE — Telephone Encounter (Signed)
Called, spoke with pt.  Informed her of below per SN.  She verbalized understanding of this, is aware MMW was sent to [facility], and is to call back if symptoms do not improve or worsens.

## 2013-05-26 ENCOUNTER — Telehealth: Payer: Self-pay | Admitting: Cardiology

## 2013-05-26 NOTE — Telephone Encounter (Signed)
New Problwm  Pt states that her Home nurse was checking her heart and heard a "clicking sound"... Bp was @ 180/100.Marland Kitchen Pt request a call back to discuss.

## 2013-05-26 NOTE — Telephone Encounter (Signed)
Spoke with patient in regards to the message she left.  I let her know the nurse would need to call in with report if she felt she needed to give updates.  She was started on medications yesterday for her cough.  She states she went to the pharmacy after the [facility] left her house and her BP there was back down to normal.  She is trying to be more compliant with her medications but also has some stressful issues  going on at present that she feels are running her BP up.  She will have the [facility] nurse call the office if she feels she needs [place] for anything.  I have asked her to follow up with pulmonary for her cough  She verbalized understanding and agrees with plan

## 2013-06-01 ENCOUNTER — Ambulatory Visit (INDEPENDENT_AMBULATORY_CARE_PROVIDER_SITE_OTHER): Payer: Medicare Other | Admitting: Endocrinology

## 2013-06-01 ENCOUNTER — Encounter: Payer: Self-pay | Admitting: Endocrinology

## 2013-06-01 VITALS — BP 126/80 | HR 67 | Ht 64.0 in | Wt 124.0 lb

## 2013-06-01 DIAGNOSIS — E1165 Type 2 diabetes mellitus with hyperglycemia: Secondary | ICD-10-CM

## 2013-06-01 DIAGNOSIS — E1129 Type 2 diabetes mellitus with other diabetic kidney complication: Secondary | ICD-10-CM

## 2013-06-01 DIAGNOSIS — E042 Nontoxic multinodular goiter: Secondary | ICD-10-CM

## 2013-06-01 LAB — TSH: TSH: 2.67 u[IU]/mL (ref 0.35–5.50)

## 2013-06-01 NOTE — Progress Notes (Signed)
Subjective:    Patient ID: [name], female    DOB: [birth year], 79 y.o.   MRN: [id number]  HPI Pt returns for f/u of type 2 DM (dx'ed 1992; she has moderate neuropathy of the lower extremities; she has associated TIA, CAD, and renal insufficiency; oral rx has been limited by renal insufficiency, so she takes insulin; insulin therapy has been limited by pt's need for a simple regimen; she was started on insulin in early 2014).  no cbg record, but states cbg's vary from 109-169.  There is no trend throughout the day. Past Medical History  Diagnosis Date  . Pulmonary embolism   . Hypertension   . CAD (coronary artery disease)   . Cardiomyopathy   . Palpitation   . Paroxysmal atrial fibrillation   . Hypercholesterolemia   . Diabetes mellitus   . Nontoxic multinodular goiter   . Pure hypercholesterolemia   . Peptic ulcer, unspecified site, unspecified as acute or chronic, without mention of hemorrhage, perforation, or obstruction   . GERD (gastroesophageal reflux disease)   . Benign neoplasm of adrenal gland   . Diverticulosis of colon (without mention of hemorrhage)   . Calculus of kidney   . 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 DJD (degenerative joint disease)   . Unspecified transient cerebral ischemia   . Anxiety   . Neuropathy   . Unspecified transient cerebral ischemia   . H. pylori infection     Past Surgical History  Procedure Laterality Date  . Knee surgery      left  . Shoulder surgery      right  . Cataract extraction      History   Social History  . Marital Status: Widowed    Spouse Name: N/A    Number of Children: 1  . Years of Education: N/A   Occupational History  . retired    Social History Main Topics  . Smoking status: Former Smoker -- 0.30 packs/day for 36 years    Types: Cigarettes    Quit date: 08/18/1983  . Smokeless tobacco: Never Used  . Alcohol Use: Yes     Comment: occ wine, beer during  holidays or at social events  . Drug Use: No  . Sexual Activity: Not on file   Other Topics Concern  . Not on file   Social History Narrative  . No narrative on file    Current Outpatient Prescriptions on File Prior to Visit  Medication Sig Dispense Refill  . amLODipine (NORVASC) 2.5 MG tablet Take 1 tablet (2.5 mg total) by mouth daily.  30 tablet  11  . amLODipine (NORVASC) 5 MG tablet TAKE 1 TABLET BY MOUTH EVERY DAY  30 tablet  6  . carvedilol (COREG) 12.5 MG tablet TAKE 1 & 1/2 TABLETS BY MOUTH 2 TIMES A DAY  90 tablet  6  . Diphenhyd-Hydrocort-Nystatin (FIRST-DUKES MOUTHWASH) SUSP 1 tsp gargle and swallow four times daily as needed  120 mL  0  . ezetimibe (ZETIA) 10 MG tablet Take 10 mg by mouth daily.      Marland Kitchen glucose blood (ONETOUCH VERIO) test strip And lancets 2/day 250.41  100 each  3  . insulin glargine (LANTUS) 100 UNIT/ML injection Inject 0.3 mLs (30 Units total) into the skin every morning. And pen needles 1/day  10 mL  6  . losartan (COZAAR) 100 MG tablet Take 1 tablet (100 mg total) by mouth daily.  30 tablet  11  .  PRECISION XTRA TEST STRIPS test strip TEST BLOOD SUGAR ONCE DAILY AS DIRECTED  10 each  5   No current facility-administered medications on file prior to visit.    Allergies  Allergen Reactions  . Lisinopril     REACTION: INTOL to ACE's w/ cough  . Penicillins     REACTION: rash  . Statins     Muscle Aches   Family History  Problem Relation Age of Onset  . Diabetes Maternal Grandmother   . Heart disease Maternal Aunt     x2  . Lung cancer Mother   . Brain cancer Mother   . Goiter Mother   . Colon cancer Neg Hx    BP 126/80  Pulse 67  Ht 5\' 4"  (1.626 m)  Wt 124 lb (56.246 kg)  BMI 21.27 kg/m2  SpO2 96%  Review of Systems denies hypoglycemia and weight change.    Objective:   Physical Exam VITAL SIGNS:  See vs page GENERAL: no distress  Lab Results  Component Value Date   HGBA1C 9.4* 06/01/2013      Assessment & Plan:  DM:  This insulin regimen was chosen from multiple options, for its simplicity.  The benefits of glycemic control must be weighed against the risks of hypoglycemia.  She needs increased rx. Renal insufficiency: this increases the duration of action of insulin

## 2013-06-01 NOTE — Patient Instructions (Addendum)
Please make a follow-up appointment in 3 months.  check your blood sugar twice a day.  vary the time of day when you check, between before the 3 meals, and at bedtime.  also check if you have symptoms of your blood sugar being too high or too low.  please keep a record of the readings and bring it to your next appointment here.  please call us sooner if you are having low blood sugar episodes.  blood tests are being requested for you today.  We'll contact you with results.

## 2013-06-02 ENCOUNTER — Ambulatory Visit: Payer: Medicare Other | Admitting: Pulmonary Disease

## 2013-06-19 ENCOUNTER — Encounter: Payer: Self-pay | Admitting: Pulmonary Disease

## 2013-06-19 ENCOUNTER — Ambulatory Visit (INDEPENDENT_AMBULATORY_CARE_PROVIDER_SITE_OTHER): Payer: Medicare Other | Admitting: Pulmonary Disease

## 2013-06-19 VITALS — BP 128/72 | HR 60 | Temp 97.8°F | Ht 62.0 in | Wt 128.6 lb

## 2013-06-19 DIAGNOSIS — I10 Essential (primary) hypertension: Secondary | ICD-10-CM

## 2013-06-19 DIAGNOSIS — I428 Other cardiomyopathies: Secondary | ICD-10-CM

## 2013-06-19 DIAGNOSIS — K5730 Diverticulosis of large intestine without perforation or abscess without bleeding: Secondary | ICD-10-CM

## 2013-06-19 DIAGNOSIS — F411 Generalized anxiety disorder: Secondary | ICD-10-CM

## 2013-06-19 DIAGNOSIS — E7800 Pure hypercholesterolemia, unspecified: Secondary | ICD-10-CM

## 2013-06-19 DIAGNOSIS — I4891 Unspecified atrial fibrillation: Secondary | ICD-10-CM

## 2013-06-19 DIAGNOSIS — E1129 Type 2 diabetes mellitus with other diabetic kidney complication: Secondary | ICD-10-CM

## 2013-06-19 DIAGNOSIS — N259 Disorder resulting from impaired renal tubular function, unspecified: Secondary | ICD-10-CM

## 2013-06-19 DIAGNOSIS — Z86010 Personal history of colonic polyps: Secondary | ICD-10-CM

## 2013-06-19 DIAGNOSIS — M1990 Unspecified osteoarthritis, unspecified site: Secondary | ICD-10-CM

## 2013-06-19 DIAGNOSIS — I119 Hypertensive heart disease without heart failure: Secondary | ICD-10-CM

## 2013-06-19 DIAGNOSIS — G589 Mononeuropathy, unspecified: Secondary | ICD-10-CM

## 2013-06-19 DIAGNOSIS — K219 Gastro-esophageal reflux disease without esophagitis: Secondary | ICD-10-CM

## 2013-06-19 DIAGNOSIS — I2510 Atherosclerotic heart disease of native coronary artery without angina pectoris: Secondary | ICD-10-CM

## 2013-06-19 NOTE — Progress Notes (Signed)
Subjective:    Patient ID: [name], female    DOB: 1928/03/16, 79 y.o.   MRN: [id number]  HPI 79 y/o BF here for a follow up visit... he has multiple medical problems as noted below...    ~  December 01, 2011:  5-433mo ROV & [name] will be 89 in a few days; she has been doing well she says "just the usual aches & pains" but she notes that she "fell over the dog" w/ torn lig in knee> went to ER & f/u w/ DrACarter- given brace & conservative Rx, improved;  She tells me she is "tired of medications" & this is a bad sign usually meaning that she has stopped several meds etc (eg- she is not taking the Prav40);  BP appears ok on her 3 meds below & she denies CP, palpit, SOB, edema, etc;  DM regulated by DrEllison for Endocrine on below but recent A1c= 9.4 & his note of 3/13 is reviewed...  See prob list below>>  ~  May 31, 2012:  33mo ROV & [name] reports a good 33mo interval- denies any new complaints or concerns...    HBP> on Coreg12.5gm-1.5tabsBid, Norvasc5+2.5 & Cozaar100; BP= 138/80 & she denies CP, palpit, SOB, edema...    CAD, Cardiomyop, PAF> followed by DrHochrein & seen 9/13- Hx HBP, nonobstructive CAD, & nonischemic cardiomyop; Myoview showed no evid scar or ischemia, EF+44%, HK in septum & infer wall; he incr the Norvasc to 7.5mg /d.    Chol> refuses meds, ?on diet; FLP showed TChol 254, TG 189, HDL 75, LDL 154 and she continues to refuse med rx, referred to [facility].    DM> on Glimep2, Januv100, Parlodel2.5 per DrEllison (last seen 9/13); ?if taking regularly, she has refused additional meds or insulin; she says "meds can lead to other disorders- maybe I should stop reading"; labs showed BS=161, A1c=9.1; Rec to take meds every day & f/u w/ DrEllison...    Multinod Thyroid> gland feels unchanged & TSH=1.53; Sonar 3/11 showed mult nodules, diffuse inhomogeneous echotexture & dominant left loer pole nodule- Bx= hyperplastic nodule...    Renal Insuffic> mild prob w/ Creat= 1.3 & we are  following... We reviewed prob list, meds, xrays and labs> see below for updates >> she declines the Flu vaccine... LABS 10/13:  FLP- not at goals on diet alone;  Chems- ok x BS=161 A1c=9.1;  CBC- wnl;  TSH=1.53   ~  November 30, 2012:  33mo ROV & [name] is c/o some leg cramps, dry mouth, itchy eyes, and stress dealing w/ a 55 y/o aunt!  She notes that mustard, vinegar water, & tonic water all help the leg cramps; Zyrtek, Benedryl etc help the eye symptoms; and she is dealing w/ her aunt (declines offer for anxiolytic therapy... We reviewed the following medical problems during today's office visit >>     HBP> on Coreg12.5gm-1.5tabsBid, Norvasc5+2.5 & Cozaar100; BP= 134/90 & she denies CP, palpit, SOB, edema; reminded to elim salt & take meds every day...    CAD, Cardiomyop, PAF> followed by DrHochrein & seen 9/13- Hx HBP, nonobstructive CAD, & nonischemic cardiomyop; Myoview showed no evid scar or ischemia, EF+44%, HK in septum & infer wall; he incr the Norvasc to 7.5mg /d...    Chol> on Zetia10 now from [facility], ?on diet; FLP 3/14 showed TChol 251, TG 135, HDL 87, LDL 121; she has hx INTOL to all statins, referred to [facility] & seen by [facility]...    DM> on Lantus10u now; off all oral  meds per DrEllison; she says "meds can lead to other disorders- maybe I should stop reading"; last A1c in 1/14 was 8.4; she declines f/u labs today wants to wait for DrEllison appt.    Multinod Thyroid> gland feels unchanged & TSH 10/13 was1.53; Sonar 3/11 showed mult nodules, diffuse inhomogeneous echotexture & dominant left loer pole nodule- Bx= hyperplastic nodule...    Renal Insuffic> mild prob w/ Creat= 1.3 range & we are following... We reviewed prob list, meds, xrays and labs> see below for updates >>   ~  June 19, 2013:  21mo ROV & [name] remains stoic and is w/o complaints or concerns...     BP controlled on Coreg12.5mg =1.5 tabs Bid, Amlod5+2.5 daily, Losar100;  BP= 128/72 & she denies CP, palpit, dizzy, SOB,  edema...    CAD, Cardiomyop, PAF> followed by DrHochrein and due for Cards f/u, last seen 9/13 w/ EF=44% w/ HK & no ischemia on Myoview...     Chol is treated w/ Zetia10 (via [facility]) but she only takes it when she has samples and refuses to purchase this med, doesn't even know her co-pay; Last FLP 9/14 showed TChol 252, TG 242, HDL 75, LDL 136...    DM treated by DrEllison on Labntus 30u daily; Last A1c was 10/14 = 9.4 and she is rec to incr to 40u and f/u w/ Endocrine...    Stress caring for an elderly aunt... We reviewed prob list, meds, xrays and labs> see below for updates >> she declines the 2014 Flu vaccine          Problem List:      Hx of PULMONARY EMBOLISM (ICD-415.19) - presented w/ pleuritic right CP 7/10 w/ CT angio showing pulm emboli & Rx w/ hep> coumadin... protimes were followed w/ the [facility] office protocol... ~  hosp by Cards 9/10 for CP- she ruled out for MI & had f/u CT Angio- NEG for emboli (prev emboli resolved), & VenDopplers- NEG for DVT... ~  Protime 11/10= 49.4/ INR 4.8 (on Coumadin 5mg /d) & she's hi risk for bleed w/ hx DU & AVM- therefore coumadin stopped. ~  1/11:  Coumadin restarted by DrHochrein for PAF on Holter and elevated CHADS2 score, note: she is high risk for coumadin due to non-compliance, hx DU w/ bleed, & hx AVM in brain w/ SA[facility] in 2001. ~  4/12:  Coumadin stopped during the 4/12 hospitalization by [facility] in consult w/ Cards- DrHochrein (Disch summary reviewed). ~  CXR 4/12 showed cardiomeg, clear lungs, NAD.Marland KitchenMarland Kitchen ~  CT Angio Chest 4/12 showed no PE, scat atheroclerotic calcif in Ao & coronaries, basilar atx, DJD spine...  HYPERTENSION (ICD-401.9) - long hx of signif HBP w/ hosp for [facility] in 2001 by DrKritzer (due to an AVM)> on COREG 12.5mg - 1.5tabsBid, COZAAR 100mg /d, & AMLODIPINE 7.5mg /d;  previously on Toprol XL & Lotrel- developed an ACE cough; long hx non-compliance w/ med Rx- she prefers garlic and lemon juice!!!  ~  4/10:  BP= 160/90 today, pt supposed  to be on Bisoprolol 2.5mg  daily, and add Losartan 100mg /d. ~  7/10:  BP= 140/80 on Ziac2.5 + Cozaar100... reminded to take meds every day!!! ~  9/10:  BP= 132/82 on Ziac2.5 + Cozaar 100... continue same! ~  10/10: Hosp by Cards and meds changed- COREG 3.125Bid + COZAAR100- 1/2 tab/d...  ~  11/10:  BP today= 146/90 & states she doesn't feel well- rec incr Cozaar back to 100mg /d. ~  2/11:  BP= 154/90 & supposed to be on Coreg  6.25Bid & Losartan 100mg /d- Coreg incr to 12.5Bid. ~  9/11:  BP= 150/90 & pt reminded to take meds every day! ~  11/11:  DrHochrein up-titrated Coreg to 18.75mg  Bid, +Cozaar 100mg /d, BP= 142/84> ?compliance. ~  4/12:  BP= 132/76 & stable, continue same meds & remember to bring all meds to the office visits... ~  10/12:  BP= 160/88 & I suspect noncompliance w/ meds; encouraged to take all meds every day & bring bottles to every visit. ~  4/13:  BP= 142/80 (on Coreg, Norvasc, Cozaar), & she remains asymptomatic; denies CP, palpit, SOB, edema, etc... ~  10/13:  BP= 138/80 (on Coreg18.75, Norvasc7.5, Cozaar100) & she remains asymptomatic... ~  4/14: on Coreg12.5gm-1.5tabsBid, Norvasc5+2.5 & Cozaar100; BP= 134/90 & she denies CP, palpit, SOB, edema; reminded to elim salt & take meds every day. ~  11/14: BP controlled on Coreg12.5mg =1.5 tabs Bid, Amlod5+2.5 daily, Losar100;  BP= 128/72 & she denies CP, palpit, dizzy, SOB, edema.  CORONARY ARTERY DISEASE (ICD-414.00) & CARDIOMYOPATHY (ICD-425.4) - followed by DrHochrein for Cards> she is ACE intolerant w/ cough, and has long hx of non-compliance w/ med Rx. ~  abn EKG w/ LBBB... ~  2DEcho 3/10 showed diffuse LV HK w/ EF~25%, mild calcif AoV, mild MR, mod dil LA & atrial septal aneurysm. ~  NuclearStressTest 2/10 showed cardiomyop w/ diffuse HK & EF~35%. ~  Cath 11/15/08 w/ EF= 30-35%, & non-obstructive CAD w/ 30-40% midLAD, 40-70% Diag branch of LAD, calcif in proxCIRC, & 30% midRCA. ~  Repeat 2DECho 7/10 showed infer AK, septal  dysynergy, atrial septal aneurym, EF= 30-35%... ~  Hosp 10/10 by Cards & r/o for MI, meds adjusted as noted. ~  4/12:  Hosp by [facility] w/ atyp CP & r/o for MI> disch for Cards f/u and any further out pt work up... ~  9/13:  She had f/u DrHochrein & EKG showed SBrady, rate54, IVCD, LAD; he increased her Norvasc to 7.5mg /d. ~  Myoview 9/13 showed no evid scar or ischemia, EF+44%, HK in septum & infer wall... ~  11/14: CAD, Cardiomyop, PAF> followed by DrHochrein and due for Cards f/u, last seen 9/13 w/ EF=44% w/ HK & no ischemia on Myoview.  PAROXYSMAL ATRIAL FIBRILLATION (ICD-427.31) - eval for palpit by DrHochrein w/ PAF on Holter> he ordered Coumadin restarted 2/11 ==> it was discontinued again 4/12 Hosp...  HYPERCHOLESTEROLEMIA (ICD-272.0) - she refused meds in past & preferred Rx w/ red yeast rice, oatmeal, herbal remedies. ~  FLP 11/07 showed TChol 285, TG 155, HDL 80, LDL 165... obviously not taking med. ~  FLP 10/09 showed TChol 294, TG 205, HDL 78, LDL 157... she does not want to start statin Rx. ~  Discussed starting Rx in light of her cardiomyopathy & CAD on cath- she refuses statin Rx. ~  FLP 4/10 on diet showed TChol 211, TG 48, HDL 94, LDL 98... rec> keep up the great work! ~  FLP 2/11 on diet showed TChol 278, TG 303, HDL 74, LDL 154... rec> try PRAVACHOL40. ~  pt admits to taking the Prav40 "only now & again"> asked to take it daily. ~  FLP 1/12 on Prav40 showed TChol 198, TG 175, HDL 71, LDL 92 ~  FLP 10/12 off meds showed TChol 273, TG 151, HDL 79, LDL 170 (goal<70) & she was referred to [facility]. ~  11/12: she established w/ the [facility] & told them the only med she took regularly was the Coreg; diet noted to be poor, &  counseled appropriately, asked to restart PRAV40- MWF but she never followed up w/ them... ~  4/13:  Pt advised on diet, exercise, & asked to restart PRAV40 as requested by [facility]; she was asked to sched f/u in the clinic to help lipid management... ~   FLP 10/13 showed TChol 254, TG 189, HDL 75, LDL 154... She is encouraged to f/u w/ LC... ~  [facility] Clinic started [facility] & has counseled her on diet as well... ~  FLP 9/14 on Zetia10 when she can get samples showed TChol 252, TG 242, HDL 75, LDL 136  DIABETES MELLITUS (ICD-250.00) - long hx of chronic non-compliance w/ med Rx ~  labs 10/09 showed BS= 319, HgA1c= 11.8.Marland KitchenMarland Kitchen re-started Metformin 500mg -2Bid + Glimepiride 4mg /d, but she c/o abd pain on the Metformin and stopped the Glimepiride on her own... referred to Endocrine. ~  DrEllison opted for Rx w/ ACTOS 45mg /d, & GLIMEPIRIDE 1mg - 1/2 tabAM (intol Metformin). ~  labs 4/10 showed BS= 102, A1c= 6.9.Marland KitchenMarland Kitchen continue same. ~  7/10: regulated in hosp w/ GLUCOPHAGE 500- 1/2Bid + GLIMEPIRIDE 1mg /d... labs showed BS= 144, A1c= 7.4. ~  9/10: labs showed BS= 270, A1c= 7.3.Marland KitchenMarland Kitchen prev intol to Metform500, therefore incr GLIMEPIRIDE 2mg Qam (she refused). ~  11/10: Bs= 201, A1c= 8.4.Marland KitchenMarland Kitchen rec try to incr Metform500Bid & Glimep2mg /d... ~  labs 2/11 showed BS= 188, A1c= 9.0 (hasn't filled Metform in months & ?taking Glimep1mg ?). ~  labs 6/11 showed BS= 238, A1c=9.4 (Pharm confirms not taking meds regularly)... rec> take meds daily!  NOTE: Creat=1.2, Umicroalb=pos. ~  followed by DrEllison every 3 months- he tried to add Januvia, she never filled the Rx. ~  labs 1/12 showed BS= 122, A1c= 8.5.Marland KitchenMarland Kitchen rec to take Metform500Bid, Glimep2mg /d, Januvia100mg /d. ~  4/12 Hosp showed BS= 111-310 & A1c= 7.6; disch back on Metform500Bid, Amaryl 2mg , ?Januvia100 (?if taking)... ~  Labs 10/12 showed BS= 206, A1c= 8.9 ?on Amaryl monotherapy> poor control & encouraged to f/u w/ DrEllison/ DM management. ~  4/13:  We reviewed her DM mamagement under the direction of DrEllison; recent A1c=9.4 & compliance w/ diet & meds is the major issue> discussed w/ pt. ~  10/13:  Supposed to be on Glimep2, Januv100, Parlodel2.5- BS=161, A1c=9.1 and she will sched f/u w/ DrEllison... ~  DrEllison has stopped  oral meds and switched to LANTUS insulin... ~  11/14: DM treated by DrEllison on Labntus 30u daily; Last A1c was 10/14 = 9.4 and she is rec to incr to 40u and f/u w/ Endocrine.  GERD (ICD-530.81) - previously on herbal remedies w/ prev eval from [facility] & DrNat-Mann in 1998...  ~  [facility] 6/10 w/ hematemesis & UGI bleed from DU +HPylori... eval by [facility] & Rx w/ OMPEPRAZOLE 40mg  & PYLERA (broken down to the generic Bismuth, Metronidazole, Tetracycline- she is Penicillin allergic) ~  GI f/u DrStark 9/10- HPylori eradicated, continue Omep40mg /d...  DIVERTICULOSIS OF COLON (ICD-562.10)  COLON POLYPS  ~  last procedure= FlexSig 1998 by DrStark showing divertics only... 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  NEPHROLITHIASIS & Mild RENAL INSUFFIC >> ~  Her renal function is normal w/ Creat = 1.1 - 1.2 range... ~  Labs 10/13 showed BUN= 23, Creat= 1.3  DEGENERATIVE JOINT DISEASE (ICD-715.90) -  uses VICODIN Prn... ~  10/12:  She noted fall w/ ?ligament damage in right knee & treated by DrACarter w/ Vicodin...  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 sudden HA- eval revealed AVM & conservative approach recommended by Neurosurgery w/ BP control... ~  She has been reminded on numerous occas about the importance of BP control, taking meds everyday to prevent recurrent ICH, stroke, etc...  NEUROPATHY (ICD-355.9)  ANXIETY (ICD-300.00)   Past Surgical History  Procedure Laterality Date  . Knee surgery      left  . Shoulder surgery      right  . Cataract extraction      Outpatient  Encounter Prescriptions as of 06/19/2013  Medication Sig  . amLODipine (NORVASC) 2.5 MG tablet Take 1 tablet (2.5 mg total) by mouth daily.  Marland Kitchen amLODipine (NORVASC) 5 MG tablet TAKE 1 TABLET BY MOUTH EVERY DAY  . carvedilol (COREG) 12.5 MG tablet TAKE 1 & 1/2 TABLETS BY MOUTH 2 TIMES A DAY  . Diphenhyd-Hydrocort-Nystatin (FIRST-DUKES MOUTHWASH) SUSP 1 tsp gargle and swallow four times daily as needed  . ezetimibe (ZETIA) 10 MG tablet Take 10 mg by mouth daily.  Marland Kitchen glucose blood (ONETOUCH VERIO) test strip And lancets 2/day 250.41  . insulin glargine (LANTUS) 100 UNIT/ML injection Inject 40 Units into the skin every morning. And pen needles 1/day  . losartan (COZAAR) 100 MG tablet Take 1 tablet (100 mg total) by mouth daily.  Marland Kitchen PRECISION XTRA TEST STRIPS test strip TEST BLOOD SUGAR ONCE DAILY AS DIRECTED    Allergies  Allergen Reactions  . Lisinopril     REACTION: INTOL to ACE's w/ cough  . Penicillins     REACTION: rash  . Statins     Muscle Aches    Current Medications, Allergies, Past Medical History, Past Surgical History, Family History, and Social History were reviewed in [employer] record.   Review of Systems         See HPI - all other systems neg except as noted... The patient complains of dyspnea on exertion.  The patient denies anorexia, fever, weight loss, weight gain, vision loss, decreased hearing, hoarseness, chest pain, syncope, peripheral edema, prolonged cough, headaches, hemoptysis, abdominal pain, melena, hematochezia, severe indigestion/heartburn, hematuria, incontinence, muscle weakness, suspicious skin lesions, transient blindness, difficulty walking, depression, unusual weight change, abnormal bleeding, enlarged lymph nodes, and angioedema.     Objective:   Physical Exam      WD, WN, 79 y/o BF in NAD... GENERAL:  Alert & oriented; pleasant & cooperative... HEENT:  [place]/AT, EOM-wnl, PERRLA, EACs-clear, TMs-wnl, NOSE-clear, THROAT-clear &  wnl. NECK:  Supple w/ fairROM; no JVD; normal carotid impulses w/o bruits; no thyromegaly but sm nodule palpated; no lymphadenopathy. CHEST:  Clear to P & A; without wheezes/ rales/ or rhonchi. HEART:  Regular Rhythm; gr 1/6 SEM, no rubs or gallops heard... ABDOMEN:  Soft & nontender; normal bowel sounds; no organomegaly or masses detected. EXT: without deformities, mild arthritic changes; no varicose veins/ venous insuffic/ or edema. NEURO:  CN's intact; motor testing normal; sensory testing normal; gait normal & balance OK. DERM:  No lesions noted; no rash etc...  RADIOLOGY DATA:  Reviewed in the EPIC EMR & discussed w/ the patient...  LABORATORY DATA:  Reviewed in the EPIC EMR & discussed w/ the patient...   Assessment & Plan:    CP, known  CAD>  She was adm 4/12 w/ atypCP & ruled out, likely musculoskeletal, TH service consulted DrMcLean & contacted DrHochrein who indicated out-pt work up & agreed w/ stopping her Coumadin; she saw DrHochrein 9/13 w/ Myoview as above; on Coreg, Norvasc, Cozaar...  Hx PE in 2010>  She had neg CT Angio 4/12 hosp & her coumadin was stopped...  HBP>  Poorly controlled due to poor medication compliance- she is encouraged to take all meds everyday & fill her prescriptions every month; she is reminded to bring meds to each & every visit...  HxPAF>  She has maintained NSR and is followed by DrHochrein...  CHOL>  she was referred to the [facility] for their help & started on Zetia10 but only takes it when she can get samples...  DM>  Followed by DrEllison & her DM control is poor due to medication noncompliance & perceived side effects; he switched her to Lantus insulin w/ control...  Multinodular thyroid>  Clinically & biochem euthyroid...  GI>  Stable on her herbal remedies that she likes so much...  Renal Insuffic>  Creat= 1.3 at present...  HxAVM & TIA>  Aware, she denies cerebral ischemic symptoms; she is reminded of the critically important aspect  of BP control to avoid ICH w/ her AVM.Marland KitchenMarland Kitchen   Patient's Medications  New Prescriptions   No medications on file  Previous Medications   AMLODIPINE (NORVASC) 2.5 MG TABLET    Take 1 tablet (2.5 mg total) by mouth daily.   AMLODIPINE (NORVASC) 5 MG TABLET    TAKE 1 TABLET BY MOUTH EVERY DAY   CALCIUM CARBONATE (OS-CAL) 600 MG TABS TABLET    Take 600 mg by mouth 2 (two) times daily with a meal.   CHOLECALCIFEROL (VITAMIN D) 1000 UNITS TABLET    Take 1,000 Units by mouth daily.   EZETIMIBE (ZETIA) 10 MG TABLET    Take 10 mg by mouth daily.   GLUCOSE BLOOD (ONETOUCH VERIO) TEST STRIP    And lancets 2/day 250.41   INSULIN GLARGINE (LANTUS) 100 UNIT/ML INJECTION    Inject 45 Units into the skin every morning. And pen needles 1/day  Modified Medications   Modified Medication Previous Medication   CARVEDILOL (COREG) 12.5 MG TABLET carvedilol (COREG) 12.5 MG tablet      TAKE 1 & 1/2 TABLETS BY MOUTH 2 TIMES A DAY    TAKE 1 & 1/2 TABLETS BY MOUTH 2 TIMES A DAY   LOSARTAN (COZAAR) 100 MG TABLET losartan (COZAAR) 100 MG tablet      TAKE 1 TABLET (100 MG TOTAL) BY MOUTH DAILY.    Take 1 tablet (100 mg total) by mouth daily.  Discontinued Medications   DIPHENHYD-HYDROCORT-NYSTATIN (FIRST-DUKES MOUTHWASH) SUSP    1 tsp gargle and swallow four times daily as needed   PRECISION XTRA TEST STRIPS TEST STRIP    TEST BLOOD SUGAR ONCE DAILY AS DIRECTED

## 2013-06-19 NOTE — Patient Instructions (Signed)
Today we updated your med list in our EPIC system...    Continue your current medications the same...  I found the message from drElison asking you to increase the Lantus to 40u per day...  Let's get on track w/ our diet & exercise program...  Call for any questions...  Let's plan a follow up visit in 8mo, sooner if needed for problems.Marland KitchenMarland Kitchen

## 2013-06-22 ENCOUNTER — Other Ambulatory Visit: Payer: Self-pay

## 2013-08-21 ENCOUNTER — Other Ambulatory Visit: Payer: Self-pay | Admitting: Pulmonary Disease

## 2013-08-21 ENCOUNTER — Other Ambulatory Visit: Payer: Self-pay | Admitting: Cardiology

## 2013-08-31 ENCOUNTER — Ambulatory Visit: Payer: Medicare Other | Admitting: Endocrinology

## 2013-09-01 ENCOUNTER — Ambulatory Visit (INDEPENDENT_AMBULATORY_CARE_PROVIDER_SITE_OTHER): Payer: Medicare Other | Admitting: Endocrinology

## 2013-09-01 ENCOUNTER — Encounter: Payer: Self-pay | Admitting: Endocrinology

## 2013-09-01 VITALS — BP 122/76 | HR 63 | Temp 97.8°F | Ht 62.0 in | Wt 125.0 lb

## 2013-09-01 DIAGNOSIS — E1129 Type 2 diabetes mellitus with other diabetic kidney complication: Secondary | ICD-10-CM

## 2013-09-01 DIAGNOSIS — E1165 Type 2 diabetes mellitus with hyperglycemia: Principal | ICD-10-CM

## 2013-09-01 LAB — MICROALBUMIN / CREATININE URINE RATIO
Creatinine,U: 66.9 mg/dL
Microalb Creat Ratio: 110.9 mg/g — ABNORMAL HIGH (ref 0.0–30.0)
Microalb, Ur: 74.2 mg/dL — ABNORMAL HIGH (ref 0.0–1.9)

## 2013-09-01 LAB — HEMOGLOBIN A1C: Hgb A1c MFr Bld: 8.5 % — ABNORMAL HIGH (ref 4.6–6.5)

## 2013-09-01 NOTE — Progress Notes (Signed)
Subjective:    Patient ID: [name], female    DOB: 11/03/1927, 80 y.o.   MRN: [id number]  HPI Pt returns for f/u of type 2 DM (dx'ed 1992, after a beesting; she has moderate neuropathy of the lower extremities; she has associated TIA, CAD, and renal insufficiency; oral rx was limited by renal insufficiency, so she was started on insulin in early 2014; she was willing to take as often as qd only; she has never had severe hypoglycemia or DKA).  no cbg record, but states cbg's vary from 79-196.  There is no trend throughout the day, but it is lowest after a missed meal. Past Medical History  Diagnosis Date  . Pulmonary embolism   . Hypertension   . CAD (coronary artery disease)   . Cardiomyopathy   . Palpitation   . Paroxysmal atrial fibrillation   . Hypercholesterolemia   . Diabetes mellitus   . Nontoxic multinodular goiter   . Pure hypercholesterolemia   . Peptic ulcer, unspecified site, unspecified as acute or chronic, without mention of hemorrhage, perforation, or obstruction   . GERD (gastroesophageal reflux disease)   . Benign neoplasm of adrenal gland   . Diverticulosis of colon (without mention of hemorrhage)   . Calculus of kidney   . 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 DJD (degenerative joint disease)   . Unspecified transient cerebral ischemia   . Anxiety   . Neuropathy   . Unspecified transient cerebral ischemia   . H. pylori infection     Past Surgical History  Procedure Laterality Date  . Knee surgery      left  . Shoulder surgery      right  . Cataract extraction      History   Social History  . Marital Status: Widowed    Spouse Name: N/A    Number of Children: 1  . Years of Education: N/A   Occupational History  . retired    Social History Main Topics  . Smoking status: Former Smoker -- 0.30 packs/day for 36 years    Types: Cigarettes    Quit date: 08/18/1983  . Smokeless tobacco: Never Used  .  Alcohol Use: Yes     Comment: occ wine, beer during holidays or at social events  . Drug Use: No  . Sexual Activity: Not on file   Other Topics Concern  . Not on file   Social History Narrative  . No narrative on file    Current Outpatient Prescriptions on File Prior to Visit  Medication Sig Dispense Refill  . amLODipine (NORVASC) 2.5 MG tablet Take 1 tablet (2.5 mg total) by mouth daily.  30 tablet  11  . amLODipine (NORVASC) 5 MG tablet TAKE 1 TABLET BY MOUTH EVERY DAY  30 tablet  6  . carvedilol (COREG) 12.5 MG tablet TAKE 1 & 1/2 TABLETS BY MOUTH 2 TIMES A DAY  90 tablet  0  . ezetimibe (ZETIA) 10 MG tablet Take 10 mg by mouth daily.      Marland Kitchen glucose blood (ONETOUCH VERIO) test strip And lancets 2/day 250.41  100 each  3  . insulin glargine (LANTUS) 100 UNIT/ML injection Inject 45 Units into the skin every morning. And pen needles 1/day      . losartan (COZAAR) 100 MG tablet TAKE 1 TABLET (100 MG TOTAL) BY MOUTH DAILY.  30 tablet  6   No current facility-administered medications on file prior to visit.  Allergies  Allergen Reactions  . Lisinopril     REACTION: INTOL to ACE's w/ cough  . Penicillins     REACTION: rash  . Statins     Muscle Aches    Family History  Problem Relation Age of Onset  . Diabetes Maternal Grandmother   . Heart disease Maternal Aunt     x2  . Lung cancer Mother   . Brain cancer Mother   . Goiter Mother   . Colon cancer Neg Hx     BP 122/76  Pulse 63  Temp(Src) 97.8 F (36.6 C) (Oral)  Ht 5\' 2"  (1.575 m)  Wt 125 lb (56.7 kg)  BMI 22.86 kg/m2  SpO2 97%  Review of Systems denies hypoglycemia and weight change.      Objective:   Physical Exam VITAL SIGNS:  See vs page GENERAL: no distress  Lab Results  Component Value Date   HGBA1C 8.5* 09/01/2013      Assessment & Plan:  DM: therapy limited by pt's need for a simple regimen.  She needs increased rx, but she also needs to avoid hypoglycemia by not missing meals.  CAD: in  this context, she should avoid hypoglycemia.

## 2013-09-01 NOTE — Patient Instructions (Addendum)
Please make a follow-up appointment in 3 months.  check your blood sugar twice a day.  vary the time of day when you check, between before the 3 meals, and at bedtime.  also check if you have symptoms of your blood sugar being too high or too low.  please keep a record of the readings and bring it to your next appointment here.  please call us sooner if you are having low blood sugar episodes.  blood and urine tests are being requested for you today.  We'll contact you with results.  On this type of insulin schedule, you should eat meals on a regular schedule.  If a meal is missed or significantly delayed, your blood sugar could go low.

## 2013-09-18 ENCOUNTER — Telehealth: Payer: Self-pay | Admitting: Cardiology

## 2013-09-18 NOTE — Telephone Encounter (Signed)
New Problem:  Pt states she had a scary event yesterday morning. Pt states she woke up at 2am b/c she felt like she wasn't getting any air. Pt states she set up and was able to breath a little better. Pt states later that day around 9am she was getting ready for church and had an aching pain behind her left breast. Pt would like to speak to the nurse. Pt states she is not SOB now or in any pain now.

## 2013-09-18 NOTE — Telephone Encounter (Signed)
Pt states she woke up yesterday morning about 2 AM with SOB. She got up and put her face out the door to breath  cold air, her breathing got better. When she got up about 8 or 9 AM she started having an aching doll pain under her arm pint beside her  left breast. Which lasted a little while.which got better with rest. Pt feels fine today she denies SOB nor pain. Pt was seen by Dr. Percival Spanish last O/v on 05/05/12. An appointment was made with Estella Husk PA on 09/20/13/ at 12:45 pm . PT IS AWARE TO CALL THIS OFFICE OF GO TO THE er IF SYMPTOMS COMEBACK AND GET WORSE.Pt verbalized understanding.

## 2013-09-20 ENCOUNTER — Encounter: Payer: Self-pay | Admitting: Physician Assistant

## 2013-09-20 ENCOUNTER — Ambulatory Visit (INDEPENDENT_AMBULATORY_CARE_PROVIDER_SITE_OTHER): Payer: Medicare Other | Admitting: Physician Assistant

## 2013-09-20 VITALS — BP 168/96 | HR 100 | Ht 63.0 in | Wt 123.0 lb

## 2013-09-20 DIAGNOSIS — I10 Essential (primary) hypertension: Secondary | ICD-10-CM

## 2013-09-20 DIAGNOSIS — I2510 Atherosclerotic heart disease of native coronary artery without angina pectoris: Secondary | ICD-10-CM

## 2013-09-20 DIAGNOSIS — R002 Palpitations: Secondary | ICD-10-CM

## 2013-09-20 DIAGNOSIS — R079 Chest pain, unspecified: Secondary | ICD-10-CM

## 2013-09-20 NOTE — Progress Notes (Signed)
HPI:  This is an 80 year old African American female patient Dr.Hochrein who has a nonischemic cardiomyopathy ejection fraction 44% on Lexi scan  04/2012 with no ischemia. She also has nonobstructive CAD on cath in 2010. At that time she had higher grade small vessel disease.  She comes in today complaining of several week history of rapid heartbeat followed by a dull ache behind her left breast that radiates under her armpit. She also becomes short of breath with it. She says it occurs usually when she is agitated and the only thing that relieves it is sitting down and trying to calm herself. She has chronic dyspnea on exertion that has unchanged. She watches her salt closely and denies any excess of salt or edema. She was awakened from sleep with her heart pounding and shortness of breath early Sunday morning. She does admit to not taking her carvedilol twice a day since last fall. She says she just doesn't like to take medications.    Allergies-- Lisinopril    --  REACTION: INTOL to ACE's w/ cough  -- Penicillins    --  REACTION: rash  -- Statins    --  Muscle Aches  Current Outpatient Prescriptions on File Prior to Visit: amLODipine (NORVASC) 2.5 MG tablet, Take 1 tablet (2.5 mg total) by mouth daily., Disp: 30 tablet, Rfl: 11 amLODipine (NORVASC) 5 MG tablet, TAKE 1 TABLET BY MOUTH EVERY DAY, Disp: 30 tablet, Rfl: 6 calcium carbonate (OS-CAL) 600 MG TABS tablet, Take 600 mg by mouth 2 (two) times daily with a meal., Disp: , Rfl:  carvedilol (COREG) 12.5 MG tablet, TAKE 1 & 1/2 TABLETS BY MOUTH 2 TIMES A DAY, Disp: 90 tablet, Rfl: 0 cholecalciferol (VITAMIN D) 1000 UNITS tablet, Take 1,000 Units by mouth daily., Disp: , Rfl:  ezetimibe (ZETIA) 10 MG tablet, Take 10 mg by mouth daily., Disp: , Rfl:  glucose blood (ONETOUCH VERIO) test strip, And lancets 2/day 250.41, Disp: 100 each, Rfl: 3 insulin glargine (LANTUS) 100 UNIT/ML injection, Inject 45 Units into the skin every morning. And pen  needles 1/day, Disp: , Rfl:  losartan (COZAAR) 100 MG tablet, TAKE 1 TABLET (100 MG TOTAL) BY MOUTH DAILY., Disp: 30 tablet, Rfl: 6  No current facility-administered medications on file prior to visit.   Past Medical History:   Pulmonary embolism                                           Hypertension                                                 CAD (coronary artery disease)                                Cardiomyopathy                                               Palpitation  Paroxysmal atrial fibrillation                               Hypercholesterolemia                                         Diabetes mellitus                                            Nontoxic multinodular goiter                                 Pure hypercholesterolemia                                    Peptic ulcer, unspecified site, unspecified as*              GERD (gastroesophageal reflux disease)                       Benign neoplasm of adrenal gland                             Diverticulosis of colon (without mention of he*              Calculus of kidney                                           Congenital anomaly of the peripheral vascular *              DJD (degenerative joint disease)                             Unspecified transient cerebral ischemia                      Anxiety                                                      Neuropathy                                                   Unspecified transient cerebral ischemia                      H. pylori infection                                         Past Surgical History:   KNEE SURGERY  Comment:left   SHOULDER SURGERY                                                Comment:right   CATARACT EXTRACTION                                          Review of patient's family history indicates:   Diabetes                       Maternal  Grandmother     Heart disease                  Maternal Aunt              Comment: x2   Lung cancer                    Mother                   Brain cancer                   Mother                   Goiter                         Mother                   Colon cancer                   Neg Hx                   Social History   Marital Status: Widowed             Spouse Name:                      Years of Education:                 Number of children: 1           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 Neck: No JVD, HJR, Bruit, or thyroid enlargement  Lungs: No tachypnea, clear without wheezing, rales, or rhonchi  Cardiovascular: RRR, positive S4 and 2/6 systolic murmur at the left sternal border, no bruit, thrill, or heave.  Abdomen: BS normal. Soft without organomegaly, masses, lesions or tenderness.  Extremities: without cyanosis, clubbing or edema. Good distal pulses bilateral  SKin: Warm, no lesions or rashes   Musculoskeletal: No deformities  Neuro: no focal signs  BP 168/96  Pulse 100  Ht 5\' 3"  (1.6 m)  Wt 123 lb (55.792 kg)  BMI 21.79 kg/m2   EKG: Sinus tachycardia at 100 beats per minute with nonspecific intraventricular block and T wave inversion inferior laterally. T wave changes are unchanged  from last EKG. Rate is faster. 2-D echo 2010 Study Conclusions   1. Left ventricle: There is akinesis of the inferior wall. There is     septal dyssynergy. The apex moves. Overall EF is in the 30-35%     range. The cavity size was normal. Wall thickness was increased     in a pattern of mild LVH.  2. 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     The aneurysm bows from the LA to the RA. There is no obvious     shunt.  Lexi scan 05/11/12 Impression Exercise Capacity:  Lexiscan with no exercise. BP Response:  Normal blood pressure response. Clinical Symptoms:  flushed sensation ECG Impression:  No significant ST segment change suggestive of ischemia. Comparison with Prior Nuclear Study: No images to compare  Overall Impression:  Wall motion is not normal. The patient has a history of abnormal wall motion. The nuclear images revealed no evidence of scar or ischemia.  LV Ejection Fraction: 44%.  LV Wall Motion:  There is hypokinesis of the septum and the inferior wall.

## 2013-09-20 NOTE — Assessment & Plan Note (Signed)
Patient has been complaining of 2 week history of worsening chest pain. I believe a lot of it is because she's not taking her carvedilol twice a day and her heart begins to race. She does have history of nonobstructive disease with more significant disease in the smaller vessels. I will order a Lexi scan to rule out ischemia. Recommend carvedilol 18.75 twice a day. Will see her back in 2 weeks and titrate this up.

## 2013-09-20 NOTE — Patient Instructions (Signed)
Your Physician recommends you take carvedilol 18.75 mg twice daily  Your physician recommends that you schedule a follow-up appointment in: 2 weeks with Ermalinda Barrios

## 2013-09-20 NOTE — Assessment & Plan Note (Signed)
Patient's blood pressure is elevated today. I believe it's due to to her not taking her carvedilol twice a day. Increase carvedilol to 18.75 mg twice a day. I'll see her back in 2 weeks and titrate up if necessary.

## 2013-09-20 NOTE — Assessment & Plan Note (Addendum)
Had a long talk with the patient about compliance with carvedilol. If she cannot take this twice a day we may consider changing her to Toprol once a day. Her past medical history says she has paroxysmal atrial fibrillation but I could not find a documentation of this. If her symptoms persist after increasing her carvedilol to twice a day consider event recorder.

## 2013-09-20 NOTE — Assessment & Plan Note (Signed)
Lexi scan Myoview to rule out ischemia with recurrent chest pain

## 2013-10-04 ENCOUNTER — Ambulatory Visit: Payer: Medicare Other | Admitting: Physician Assistant

## 2013-10-09 ENCOUNTER — Encounter (HOSPITAL_COMMUNITY): Payer: Self-pay | Admitting: Emergency Medicine

## 2013-10-09 ENCOUNTER — Emergency Department (HOSPITAL_COMMUNITY)
Admission: EM | Admit: 2013-10-09 | Discharge: 2013-10-09 | Disposition: A | Discharge status: Home / Self Care | Payer: Medicare Other | Attending: Emergency Medicine | Admitting: Emergency Medicine

## 2013-10-09 ENCOUNTER — Emergency Department (HOSPITAL_COMMUNITY): Payer: Medicare Other

## 2013-10-09 DIAGNOSIS — I10 Essential (primary) hypertension: Secondary | ICD-10-CM | POA: Insufficient documentation

## 2013-10-09 DIAGNOSIS — Z8659 Personal history of other mental and behavioral disorders: Secondary | ICD-10-CM | POA: Insufficient documentation

## 2013-10-09 DIAGNOSIS — E7800 Pure hypercholesterolemia, unspecified: Secondary | ICD-10-CM | POA: Insufficient documentation

## 2013-10-09 DIAGNOSIS — E119 Type 2 diabetes mellitus without complications: Secondary | ICD-10-CM | POA: Insufficient documentation

## 2013-10-09 DIAGNOSIS — Z8673 Personal history of transient ischemic attack (TIA), and cerebral infarction without residual deficits: Secondary | ICD-10-CM | POA: Insufficient documentation

## 2013-10-09 DIAGNOSIS — Z87442 Personal history of urinary calculi: Secondary | ICD-10-CM | POA: Insufficient documentation

## 2013-10-09 DIAGNOSIS — Z8619 Personal history of other infectious and parasitic diseases: Secondary | ICD-10-CM | POA: Insufficient documentation

## 2013-10-09 DIAGNOSIS — I4891 Unspecified atrial fibrillation: Secondary | ICD-10-CM | POA: Insufficient documentation

## 2013-10-09 DIAGNOSIS — Z8719 Personal history of other diseases of the digestive system: Secondary | ICD-10-CM | POA: Insufficient documentation

## 2013-10-09 DIAGNOSIS — Z794 Long term (current) use of insulin: Secondary | ICD-10-CM | POA: Insufficient documentation

## 2013-10-09 DIAGNOSIS — Z86711 Personal history of pulmonary embolism: Secondary | ICD-10-CM | POA: Insufficient documentation

## 2013-10-09 DIAGNOSIS — R109 Unspecified abdominal pain: Secondary | ICD-10-CM | POA: Insufficient documentation

## 2013-10-09 DIAGNOSIS — Z880 Allergy status to penicillin: Secondary | ICD-10-CM | POA: Insufficient documentation

## 2013-10-09 DIAGNOSIS — Z79899 Other long term (current) drug therapy: Secondary | ICD-10-CM | POA: Insufficient documentation

## 2013-10-09 DIAGNOSIS — Z8739 Personal history of other diseases of the musculoskeletal system and connective tissue: Secondary | ICD-10-CM | POA: Insufficient documentation

## 2013-10-09 DIAGNOSIS — Z87891 Personal history of nicotine dependence: Secondary | ICD-10-CM | POA: Insufficient documentation

## 2013-10-09 DIAGNOSIS — I2510 Atherosclerotic heart disease of native coronary artery without angina pectoris: Secondary | ICD-10-CM | POA: Insufficient documentation

## 2013-10-09 DIAGNOSIS — Z8669 Personal history of other diseases of the nervous system and sense organs: Secondary | ICD-10-CM | POA: Insufficient documentation

## 2013-10-09 LAB — CBC WITH DIFFERENTIAL/PLATELET
BASOS PCT: 0 % (ref 0–1)
Basophils Absolute: 0 10*3/uL (ref 0.0–0.1)
EOS ABS: 0.1 10*3/uL (ref 0.0–0.7)
Eosinophils Relative: 1 % (ref 0–5)
HCT: 35.4 % — ABNORMAL LOW (ref 36.0–46.0)
Hemoglobin: 12 g/dL (ref 12.0–15.0)
Lymphocytes Relative: 36 % (ref 12–46)
Lymphs Abs: 2.4 10*3/uL (ref 0.7–4.0)
MCH: 28.3 pg (ref 26.0–34.0)
MCHC: 33.9 g/dL (ref 30.0–36.0)
MCV: 83.5 fL (ref 78.0–100.0)
Monocytes Absolute: 0.4 10*3/uL (ref 0.1–1.0)
Monocytes Relative: 6 % (ref 3–12)
NEUTROS PCT: 57 % (ref 43–77)
Neutro Abs: 3.8 10*3/uL (ref 1.7–7.7)
Platelets: 151 10*3/uL (ref 150–400)
RBC: 4.24 MIL/uL (ref 3.87–5.11)
RDW: 13.4 % (ref 11.5–15.5)
WBC: 6.6 10*3/uL (ref 4.0–10.5)

## 2013-10-09 LAB — URINALYSIS, ROUTINE W REFLEX MICROSCOPIC
Bilirubin Urine: NEGATIVE
GLUCOSE, UA: 250 mg/dL — AB
KETONES UR: NEGATIVE mg/dL
Nitrite: NEGATIVE
PROTEIN: 100 mg/dL — AB
Specific Gravity, Urine: 1.011 (ref 1.005–1.030)
UROBILINOGEN UA: 0.2 mg/dL (ref 0.0–1.0)
pH: 7 (ref 5.0–8.0)

## 2013-10-09 LAB — BASIC METABOLIC PANEL
BUN: 24 mg/dL — AB (ref 6–23)
CO2: 23 mEq/L (ref 19–32)
CREATININE: 1.4 mg/dL — AB (ref 0.50–1.10)
Calcium: 9.1 mg/dL (ref 8.4–10.5)
Chloride: 103 mEq/L (ref 96–112)
GFR calc Af Amer: 39 mL/min — ABNORMAL LOW (ref >90)
GFR, EST NON AFRICAN AMERICAN: 34 mL/min — AB (ref >90)
Glucose, Bld: 226 mg/dL — ABNORMAL HIGH (ref 70–99)
Potassium: 4.3 mEq/L (ref 3.7–5.3)
Sodium: 138 mEq/L (ref 137–147)

## 2013-10-09 LAB — I-STAT CHEM 8, ED
BUN: 25 mg/dL — ABNORMAL HIGH (ref 6–23)
Calcium, Ion: 1.21 mmol/L (ref 1.13–1.30)
Chloride: 105 mEq/L (ref 96–112)
Creatinine, Ser: 1.5 mg/dL — ABNORMAL HIGH (ref 0.50–1.10)
GLUCOSE: 225 mg/dL — AB (ref 70–99)
HEMATOCRIT: 40 % (ref 36.0–46.0)
Hemoglobin: 13.6 g/dL (ref 12.0–15.0)
POTASSIUM: 4.1 meq/L (ref 3.7–5.3)
Sodium: 140 mEq/L (ref 137–147)
TCO2: 24 mmol/L (ref 0–100)

## 2013-10-09 LAB — I-STAT TROPONIN, ED: Troponin i, poc: 0.03 ng/mL (ref 0.00–0.08)

## 2013-10-09 LAB — URINE MICROSCOPIC-ADD ON

## 2013-10-09 LAB — TROPONIN I: Troponin I: <0.3 ng/mL (ref <0.30)

## 2013-10-09 LAB — I-STAT CG4 LACTIC ACID, ED: Lactic Acid, Venous: 1.1 mmol/L (ref 0.5–2.2)

## 2013-10-09 LAB — LACTIC ACID, PLASMA: Lactic Acid, Venous: 1 mmol/L (ref 0.5–2.2)

## 2013-10-09 MED ORDER — FENTANYL CITRATE 0.05 MG/ML IJ SOLN
50.0000 ug | Freq: Once | INTRAMUSCULAR | Status: AC
  Administered 2013-10-09: 50 ug via INTRAVENOUS
  Filled 2013-10-09: qty 2

## 2013-10-09 MED ORDER — DOCUSATE SODIUM 100 MG PO CAPS: 100.0000 mg | ORAL_CAPSULE | Freq: Two times a day (BID) | ORAL | Status: DC

## 2013-10-09 MED ORDER — HYDROCODONE-ACETAMINOPHEN 5-325 MG PO TABS: 1.0000 | ORAL_TABLET | ORAL | Status: DC | PRN

## 2013-10-09 MED ORDER — IOHEXOL 300 MG/ML  SOLN
80.0000 mL | Freq: Once | INTRAMUSCULAR | Status: AC | PRN
  Administered 2013-10-09: 75 mL via INTRAVENOUS

## 2013-10-09 NOTE — ED Provider Notes (Signed)
CSN: [id number]     Arrival date & time 10/09/13  0217 History   First MD Initiated Contact with Patient 10/09/13 0248     Chief Complaint  Patient presents with  . Abdominal Pain     (Consider location/radiation/quality/duration/timing/severity/associated sxs/prior Treatment) HPI   Patient is a very pleasant 80 yo woman with multiple chronic medical problems who presents with complaints of left flank pain which began around 1800 yesterday as she was riding as passenger in a car. She says it feels like a deep, aching pain which is nonradiating. Pain was 10/10 when paramedics arrived. The patient was treated with Fentanyl 144mcg IV and her pain is now 8/10. She says that her pain is worse with walking around.   She denies paresthesias and motor weakness. NO fevers. No nausea, vomiting, diarrhea or GU sx. Patient notes two previous episodes of renal colic and says that today's pain feels different. She has never experienced this type of pain before.  Past Medical History  Diagnosis Date  . Pulmonary embolism   . Hypertension   . CAD (coronary artery disease)   . Cardiomyopathy   . Palpitation   . Paroxysmal atrial fibrillation   . Hypercholesterolemia   . Diabetes mellitus   . Nontoxic multinodular goiter   . Pure hypercholesterolemia   . Peptic ulcer, unspecified site, unspecified as acute or chronic, without mention of hemorrhage, perforation, or obstruction   . GERD (gastroesophageal reflux disease)   . Benign neoplasm of adrenal gland   . Diverticulosis of colon (without mention of hemorrhage)   . Calculus of kidney   . 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 DJD (degenerative joint disease)   . Unspecified transient cerebral ischemia   . Anxiety   . Neuropathy   . Unspecified transient cerebral ischemia   . H. pylori infection    Past Surgical History  Procedure Laterality Date  . Knee surgery      left  . Shoulder surgery      right   . Cataract extraction     Family History  Problem Relation Age of Onset  . Diabetes Maternal Grandmother   . Heart disease Maternal Aunt     x2  . Lung cancer Mother   . Brain cancer Mother   . Goiter Mother   . Colon cancer Neg Hx    History  Substance Use Topics  . Smoking status: Former Smoker -- 0.30 packs/day for 36 years    Types: Cigarettes    Quit date: 08/18/1983  . Smokeless tobacco: Never Used  . Alcohol Use: Yes     Comment: occ wine, beer during holidays or at social events   OB History   Grav Para Term Preterm Abortions TAB SAB Ect Mult Living                 Review of Systems Ten point review of symptoms performed and is negative with the exception of symptoms noted above.    Allergies  Lisinopril; Penicillins; and Statins  Home Medications   Current Outpatient Rx  Name  Route  Sig  Dispense  Refill  . amLODipine (NORVASC) 2.5 MG tablet   Oral   Take 1 tablet (2.5 mg total) by mouth daily.   30 tablet   11   . amLODipine (NORVASC) 5 MG tablet      TAKE 1 TABLET BY MOUTH EVERY DAY   30 tablet   6  NEEDS REFILLS   . calcium carbonate (OS-CAL) 600 MG TABS tablet   Oral   Take 600 mg by mouth 2 (two) times daily with a meal.         . carvedilol (COREG) 12.5 MG tablet      TAKE 1 & 1/2 TABLETS BY MOUTH 2 TIMES A DAY   90 tablet   0     NEEDS REFILLS   . cholecalciferol (VITAMIN D) 1000 UNITS tablet   Oral   Take 1,000 Units by mouth daily.         Marland Kitchen ezetimibe (ZETIA) 10 MG tablet   Oral   Take 10 mg by mouth daily.         Marland Kitchen glucose blood (ONETOUCH VERIO) test strip      And lancets 2/day 250.41   100 each   3   . insulin glargine (LANTUS) 100 UNIT/ML injection   Subcutaneous   Inject 45 Units into the skin every morning. And pen needles 1/day         . losartan (COZAAR) 100 MG tablet      TAKE 1 TABLET (100 MG TOTAL) BY MOUTH DAILY.   30 tablet   6     NEEDS REFILLS    BP 166/120  Pulse 62  Temp(Src)  97.7 F (36.5 C) (Oral)  Resp 18  Ht 5\' 3"  (1.6 m)  Wt 121 lb 4 oz (54.999 kg)  BMI 21.48 kg/m2  SpO2 97% Physical Exam Gen: well developed and well nourished appearing, mildly uncomfortable appearing.  Head: NCAT Eyes: PERL, EOMI Nose: no epistaixis or rhinorrhea Mouth/throat: mucosa is moist and pink Neck: supple, no stridor Lungs: CTA B, no wheezing, rhonchi or rales CV: RRR, no murmur, extremities appear well perfused.  Abd: soft, notender, nondistended, ttp over the left flank Back: no ttp, no cva ttp, no cva ttp Skin: warm and dry Ext: normal to inspection, no dependent edema Neuro: CN ii-xii grossly intact, no focal deficits, normal speech Psyche; normal affect,  calm and cooperative.  ED Course  Procedures (including critical care time) Labs Review  Results for orders placed during the hospital encounter of 10/09/13 (from the past 24 hour(s))  BASIC METABOLIC PANEL     Status: Abnormal   Collection Time    10/09/13  3:45 AM      Result Value Ref Range   Sodium 138  137 - 147 mEq/L   Potassium 4.3  3.7 - 5.3 mEq/L   Chloride 103  96 - 112 mEq/L   CO2 23  19 - 32 mEq/L   Glucose, Bld 226 (*) 70 - 99 mg/dL   BUN 24 (*) 6 - 23 mg/dL   Creatinine, Ser 1.40 (*) 0.50 - 1.10 mg/dL   Calcium 9.1  8.4 - 10.5 mg/dL   GFR calc non Af Amer 34 (*) >90 mL/min   GFR calc Af Amer 39 (*) >90 mL/min  CBC WITH DIFFERENTIAL     Status: Abnormal   Collection Time    10/09/13  3:45 AM      Result Value Ref Range   WBC 6.6  4.0 - 10.5 K/uL   RBC 4.24  3.87 - 5.11 MIL/uL   Hemoglobin 12.0  12.0 - 15.0 g/dL   HCT 35.4 (*) 36.0 - 46.0 %   MCV 83.5  78.0 - 100.0 fL   MCH 28.3  26.0 - 34.0 pg   MCHC 33.9  30.0 - 36.0 g/dL   RDW 13.4  11.5 - 15.5 %   Platelets 151  150 - 400 K/uL   Neutrophils Relative % 57  43 - 77 %   Neutro Abs 3.8  1.7 - 7.7 K/uL   Lymphocytes Relative 36  12 - 46 %   Lymphs Abs 2.4  0.7 - 4.0 K/uL   Monocytes Relative 6  3 - 12 %   Monocytes Absolute 0.4   0.1 - 1.0 K/uL   Eosinophils Relative 1  0 - 5 %   Eosinophils Absolute 0.1  0.0 - 0.7 K/uL   Basophils Relative 0  0 - 1 %   Basophils Absolute 0.0  0.0 - 0.1 K/uL  LACTIC ACID, PLASMA     Status: None   Collection Time    10/09/13  3:45 AM      Result Value Ref Range   Lactic Acid, Venous 1.0  0.5 - 2.2 mmol/L  TROPONIN I     Status: None   Collection Time    10/09/13  3:45 AM      Result Value Ref Range   Troponin I <0.30  <0.30 ng/mL  URINALYSIS, ROUTINE W REFLEX MICROSCOPIC     Status: Abnormal   Collection Time    10/09/13  3:46 AM      Result Value Ref Range   Color, Urine YELLOW  YELLOW   APPearance CLEAR  CLEAR   Specific Gravity, Urine 1.011  1.005 - 1.030   pH 7.0  5.0 - 8.0   Glucose, UA 250 (*) NEGATIVE mg/dL   Hgb urine dipstick SMALL (*) NEGATIVE   Bilirubin Urine NEGATIVE  NEGATIVE   Ketones, ur NEGATIVE  NEGATIVE mg/dL   Protein, ur 100 (*) NEGATIVE mg/dL   Urobilinogen, UA 0.2  0.0 - 1.0 mg/dL   Nitrite NEGATIVE  NEGATIVE   Leukocytes, UA TRACE (*) NEGATIVE  URINE MICROSCOPIC-ADD ON     Status: Abnormal   Collection Time    10/09/13  3:46 AM      Result Value Ref Range   Squamous Epithelial / LPF RARE  RARE   WBC, UA 0-2  <3 WBC/hpf   RBC / HPF 0-2  <3 RBC/hpf   Bacteria, UA FEW (*) RARE  I-STAT TROPOININ, ED     Status: None   Collection Time    10/09/13  4:46 AM      Result Value Ref Range   Troponin i, poc 0.03  0.00 - 0.08 ng/mL   Comment 3           I-STAT CHEM 8, ED     Status: Abnormal   Collection Time    10/09/13  4:48 AM      Result Value Ref Range   Sodium 140  137 - 147 mEq/L   Potassium 4.1  3.7 - 5.3 mEq/L   Chloride 105  96 - 112 mEq/L   BUN 25 (*) 6 - 23 mg/dL   Creatinine, Ser 1.50 (*) 0.50 - 1.10 mg/dL   Glucose, Bld 225 (*) 70 - 99 mg/dL   Calcium, Ion 1.21  1.13 - 1.30 mmol/L   TCO2 24  0 - 100 mmol/L   Hemoglobin 13.6  12.0 - 15.0 g/dL   HCT 40.0  36.0 - 46.0 %  I-STAT CG4 LACTIC ACID, ED     Status: None   Collection  Time    10/09/13  4:48 AM      Result Value Ref Range   Lactic Acid, Venous 1.10  0.5 -  2.2 mmol/L   CT abd/pelvis - no acute abnormalities, no AAA, no ureteral stone or cause for left flank pain identified.   MDM   DDX: myofascial pain, UTI, renal colic, pyelonephritis, splenic infarct, pneumonia, ACS.   After extensive work up which is non-diagnostic and in light of pain which is reproducible with palpation, I suspect that the patient's pain is myofascial in origin. Her VS have remained wnl in the ED and she is stable for d/c with plan for symptomatic management and f/u with PCP.     Elyn Peers, MD 10/09/13 [phone]

## 2013-10-09 NOTE — ED Notes (Signed)
Per patient left abdominal flank pain started last night around 1800, that progressively getting worse  This morning. Pt has hx of kidney stone. 100 mcg of fentanyl and 4mg  of Zofran given by EMS., Patient stated pain med given by EMS relieved her from 9/10 to 5/10.

## 2013-10-09 NOTE — ED Notes (Signed)
ED EKG entered in error. EKG was not performed on this pt

## 2013-10-09 NOTE — ED Notes (Signed)
Pt reports having Left flank pain and pain that radiates to the left back.  Pt denies pain during urination, no vaginal discharge, no chest pain.    Phlebotomy at bedside.

## 2013-10-09 NOTE — ED Notes (Signed)
Pt returned from CT °

## 2013-10-12 ENCOUNTER — Ambulatory Visit: Payer: Medicare Other | Admitting: Adult Health

## 2013-10-16 ENCOUNTER — Ambulatory Visit: Payer: Medicare Other | Admitting: Adult Health

## 2013-10-18 ENCOUNTER — Ambulatory Visit (INDEPENDENT_AMBULATORY_CARE_PROVIDER_SITE_OTHER): Payer: Medicare Other | Admitting: Physician Assistant

## 2013-10-18 ENCOUNTER — Encounter: Payer: Self-pay | Admitting: Physician Assistant

## 2013-10-18 VITALS — BP 171/89 | HR 58 | Ht 63.0 in | Wt 124.0 lb

## 2013-10-18 DIAGNOSIS — R109 Unspecified abdominal pain: Secondary | ICD-10-CM

## 2013-10-18 DIAGNOSIS — R002 Palpitations: Secondary | ICD-10-CM

## 2013-10-18 DIAGNOSIS — I10 Essential (primary) hypertension: Secondary | ICD-10-CM

## 2013-10-18 DIAGNOSIS — R079 Chest pain, unspecified: Secondary | ICD-10-CM

## 2013-10-18 NOTE — Assessment & Plan Note (Signed)
Chest pain has resolved since she's taking carvedilol I prescribed dose. She did not have a Lexi scan done. Will hold off for now. She will needed if she has any further chest pain.

## 2013-10-18 NOTE — Assessment & Plan Note (Signed)
Palpitations have resolved on carvedilol

## 2013-10-18 NOTE — Progress Notes (Signed)
HPI:   [name] 80 year old African American female patient of Dr.Hochrein who I saw 2 weeks ago with worsening chest pain and palpitations. She had not been taking her carvedilol as prescribed. I increased her carvedilol to 18.75 mg twice a day and had ordered a Lexi scan Myoview. The patient did not have a Lexi scan done because she went to see how she did on the medicine. She's had no further chest pain or palpitations since she's taking her carvedilol as prescribed.  The patient did represent to the emergency room with severe left flank pain. CT scan did not show any acute abnormalities. The patient remains uncomfortable and says hydrocodone and silly thing that helps. She is scheduled to see Dr. Teressa Lower on Friday. She did have Lower lumbar facet degeneration with mild grade 1 spondylolisthesis at L4-L5. No acute osseous abnormality identified. I'm wondering if this may be contributing to her pain.  Allergies:  -- Lisinopril    --  REACTION: INTOL to ACE's w/ cough  -- Penicillins    --  REACTION: rash  -- Statins    --  Muscle Aches  Current Outpatient Prescriptions on File Prior to Visit: amLODipine (NORVASC) 2.5 MG tablet, Take 2.5 mg by mouth daily., Disp: , Rfl:  amLODipine (NORVASC) 5 MG tablet, Take 5 mg by mouth daily., Disp: , Rfl:  Calcium Carb-Cholecalciferol (CALCIUM 600 + D) 600-200 MG-UNIT TABS, Take 1 tablet by mouth daily., Disp: , Rfl:  carvedilol (COREG) 12.5 MG tablet, TAKE 1 & 1/2 TABLETS BY MOUTH 2 TIMES A DAY, Disp: 90 tablet, Rfl: 0 cholecalciferol (VITAMIN D) 400 UNITS TABS tablet, Take 400 Units by mouth daily., Disp: , Rfl:  docusate sodium (COLACE) 100 MG capsule, Take 1 capsule (100 mg total) by mouth every 12 (twelve) hours., Disp: 60 capsule, Rfl: 0 ezetimibe (ZETIA) 10 MG tablet, Take 10 mg by mouth daily., Disp: , Rfl:  glucose blood (ONETOUCH VERIO) test strip, And lancets 2/day 250.41, Disp: 100 each, Rfl: 3 HYDROcodone-acetaminophen (NORCO/VICODIN)  5-325 MG per tablet, Take 1-2 tablets by mouth every 4 (four) hours as needed., Disp: 15 tablet, Rfl: 0 insulin glargine (LANTUS) 100 UNIT/ML injection, Inject 45 Units into the skin every morning. And pen needles 1/day, Disp: , Rfl:  losartan (COZAAR) 100 MG tablet, TAKE 1 TABLET (100 MG TOTAL) BY MOUTH DAILY., Disp: 30 tablet, Rfl: 6  No current facility-administered medications on file prior to visit.   Past Medical History:   Pulmonary embolism                                           Hypertension                                                 CAD (coronary artery disease)                                Cardiomyopathy                                               Palpitation  Paroxysmal atrial fibrillation                               Hypercholesterolemia                                         Diabetes mellitus                                            Nontoxic multinodular goiter                                 Pure hypercholesterolemia                                    Peptic ulcer, unspecified site, unspecified as*              GERD (gastroesophageal reflux disease)                       Benign neoplasm of adrenal gland                             Diverticulosis of colon (without mention of he*              Calculus of kidney                                           Congenital anomaly of the peripheral vascular *              DJD (degenerative joint disease)                             Unspecified transient cerebral ischemia                      Anxiety                                                      Neuropathy                                                   Unspecified transient cerebral ischemia                      H. pylori infection                                         Past Surgical History:   KNEE SURGERY  Comment:left   SHOULDER SURGERY                                                 Comment:right   CATARACT EXTRACTION                                          Review of patient's family history indicates:   Diabetes                       Maternal Grandmother     Heart disease                  Maternal Aunt              Comment: x2   Lung cancer                    Mother                   Brain cancer                   Mother                   Goiter                         Mother                   Colon cancer                   Neg Hx                   Social History   Marital Status: Widowed             Spouse Name:                      Years of Education:                 Number of children: 1           Occupational History Occupation          Fish farm manager            Comment              retired                                   Social History Main Topics   Smoking Status: Former Smoker                   Packs/Day: 0.30  Years: 23        Types: Cigarettes     Quit date: 08/18/1983   Smokeless Status: Never Used                       Alcohol Use: Yes               Comment: occ wine, beer during holidays or at  social events   Drug Use: No             Sexual Activity: Not on file        Other Topics            Concern   None on file  Social History Narrative   None on file    ROS: See history of present illness otherwise negative   PHYSICAL EXAM: Well-nournished, in no acute distress. Neck: No JVD, HJR, Bruit, or thyroid enlargement  Lungs: No tachypnea, clear without wheezing, rales, or rhonchi  Cardiovascular: RRR, PMI not displaced, positive S4, 2/6 systolic murmur at the left sternal border, no bruit, thrill, or heave.  Abdomen: BS normal. Soft without organomegaly, masses, lesions or tenderness.  Extremities: without cyanosis, clubbing or edema. Good distal pulses bilateral  SKin: Warm, no lesions or rashes   Musculoskeletal: No deformities  Neuro: no focal signs  BP 171/89  Pulse 58  Ht 5\' 3"   (1.6 m)  Wt 124 lb (56.246 kg)  BMI 21.97 kg/m2

## 2013-10-18 NOTE — Assessment & Plan Note (Signed)
Patient continues to have left flank pain after going to the emergency room for the same. He on relief she gets is from hydrocodone. She did have some L4 and L5 problems on CT scan. We'll have her followup with Dr. Lenna Gilford for further workup.

## 2013-10-18 NOTE — Assessment & Plan Note (Signed)
Blood pressure is up today but she is having significant flank pain and taking hydrocodone. She sees Dr. Lenna Gilford on Friday.

## 2013-10-18 NOTE — Patient Instructions (Signed)
Your physician recommends that you schedule a follow-up appointment in: WITH DR. Polo IN 2 MONTHS  Your physician recommends that you continue on your current medications as directed. Please refer to the Current Medication list given to you today.

## 2013-10-20 ENCOUNTER — Encounter: Payer: Self-pay | Admitting: Adult Health

## 2013-10-20 ENCOUNTER — Other Ambulatory Visit: Payer: Medicare Other

## 2013-10-20 ENCOUNTER — Ambulatory Visit (INDEPENDENT_AMBULATORY_CARE_PROVIDER_SITE_OTHER): Payer: Medicare Other | Admitting: Adult Health

## 2013-10-20 VITALS — BP 142/84 | HR 60 | Temp 97.1°F | Ht 62.0 in | Wt 123.8 lb

## 2013-10-20 DIAGNOSIS — L0292 Furuncle, unspecified: Secondary | ICD-10-CM

## 2013-10-20 DIAGNOSIS — L0293 Carbuncle, unspecified: Secondary | ICD-10-CM

## 2013-10-20 DIAGNOSIS — R109 Unspecified abdominal pain: Secondary | ICD-10-CM

## 2013-10-20 NOTE — Assessment & Plan Note (Signed)
Left Flank Pain resolve -self limiting.  ER workup unrevealing with neg CT abd/pelvis  Labs w/out acute changes noted

## 2013-10-20 NOTE — Progress Notes (Signed)
Subjective:    Patient ID: [name], female    DOB: 1928/04/05, 79 y.o.   MRN: [id number]  HPI 80 y/o BF     ~  December 01, 2011:  5-54mo ROV & [name] will be 38 in a few days; she has been doing well she says "just the usual aches & pains" but she notes that she "fell over the dog" w/ torn lig in knee> went to ER & f/u w/ DrACarter- given brace & conservative Rx, improved;  She tells me she is "tired of medications" & this is a bad sign usually meaning that she has stopped several meds etc (eg- she is not taking the Prav40);  BP appears ok on her 3 meds below & she denies CP, palpit, SOB, edema, etc;  DM regulated by DrEllison for Endocrine on 45meds below but recent A1c= 9.4 & his note of 3/13 is reviewed...  See prob list below>>  ~  May 31, 2012:  72mo ROV & [name] reports a good 63mo interval- denies any new complaints or concerns...    HBP> on Coreg12.5gm-1.5tabsBid, Norvasc5+2.5 & Cozaar100; BP= 138/80 & she denies CP, palpit, SOB, edema...    CAD, Cardiomyop, PAF> followed by DrHochrein & seen 9/13- Hx HBP, nonobstructive CAD, & nonischemic cardiomyop; Myoview showed no evid scar or ischemia, EF+44%, HK in septum & infer wall; he incr the Norvasc to 7.5mg /d.    Chol> refuses meds, ?on diet; FLP showed TChol 254, TG 189, HDL 75, LDL 154 and she continues to refuse med rx, referred to [facility].    DM> on Glimep2, Januv100, Parlodel2.5 per DrEllison (last seen 9/13); ?if taking regularly, she has refused additional meds or insulin; she says "meds can lead to other disorders- maybe I should stop reading"; labs showed BS=161, A1c=9.1; Rec to take meds every day & f/u w/ DrEllison...    Multinod Thyroid> gland feels unchanged & TSH=1.53; Sonar 3/11 showed mult nodules, diffuse inhomogeneous echotexture & dominant left loer pole nodule- Bx= hyperplastic nodule...    Renal Insuffic> mild prob w/ Creat= 1.3 & we are following... We reviewed prob list, meds, xrays and labs> see below for updates  >> she declines the Flu vaccine... LABS 10/13:  FLP- not at goals on diet alone;  Chems- ok x BS=161 A1c=9.1;  CBC- wnl;  TSH=1.53   ~  November 30, 2012:  69mo ROV & [name] is c/o some leg cramps, dry mouth, itchy eyes, and stress dealing w/ a 45 y/o aunt!  She notes that mustard, vinegar water, & tonic water all help the leg cramps; Zyrtek, Benedryl etc help the eye symptoms; and she is dealing w/ her aunt (declines offer for anxiolytic therapy... We reviewed the following medical problems during today's office visit >>     HBP> on Coreg12.5gm-1.5tabsBid, Norvasc5+2.5 & Cozaar100; BP= 134/90 & she denies CP, palpit, SOB, edema; reminded to elim salt & take meds every day...    CAD, Cardiomyop, PAF> followed by DrHochrein & seen 9/13- Hx HBP, nonobstructive CAD, & nonischemic cardiomyop; Myoview showed no evid scar or ischemia, EF+44%, HK in septum & infer wall; he incr the Norvasc to 7.5mg /d...    Chol> on Zetia10 now from [facility], ?on diet; [place] 3/14 showed TChol 251, TG 135, HDL 87, LDL 121; she has hx INTOL to all statins, referred to [facility] & seen by [facility]...    DM> on Lantus10u now; off all oral meds per DrEllison; she says "meds can lead to other disorders- maybe I  should stop reading"; last A1c in 1/14 was 8.4; she declines f/u labs today wants to wait for DrEllison appt.    Multinod Thyroid> gland feels unchanged & TSH 10/13 was1.53; Sonar 3/11 showed mult nodules, diffuse inhomogeneous echotexture & dominant left loer pole nodule- Bx= hyperplastic nodule...    Renal Insuffic> mild prob w/ Creat= 1.3 range & we are following... We reviewed prob list, meds, xrays and labs> see below for updates >>   ~  June 19, 2013:  36mo ROV & [name] remains stoic and is w/o complaints or concerns...     BP controlled on Coreg12.5mg =1.5 tabs Bid, Amlod5+2.5 daily, Losar100;  BP= 128/72 & she denies CP, palpit, dizzy, SOB, edema...    CAD, Cardiomyop, PAF> followed by DrHochrein and due for Cards f/u, last  seen 9/13 w/ EF=44% w/ HK & no ischemia on Myoview...     Chol is treated w/ Zetia10 (via [facility]) but she only takes it when she has samples and refuses to purchase this med, doesn't even know her co-pay; Last FLP 9/14 showed TChol 252, TG 242, HDL 75, LDL 136...    DM treated by DrEllison on Labntus 30u daily; Last A1c was 10/14 = 9.4 and she is rec to incr to 40u and f/u w/ Endocrine...    Stress caring for an elderly aunt... We reviewed prob list, meds, xrays and labs> see below for updates >> she declines the 2014 Flu vaccine  10/20/2013 ER  follow up  Patient returns for an emergency room followup. Patient was seen in emergency room on February 23 for sudden onset of left flank pain. Extensive workup Including CT abdomen and pelvis and lab work was unrevealing. Patient was given pain medications and symptoms resolved. Reports she is feeling better today with no pain. Has not had any pain return since seen in ER.   Does c/o of a boil at the right axilla with soreness x1 week. No drainage or fever. , no sign redness.          Problem List:      Hx of PULMONARY EMBOLISM (ICD-415.19) - presented w/ pleuritic right CP 7/10 w/ CT angio showing pulm emboli & Rx w/ hep> coumadin... protimes were followed w/ the [facility] office protocol... ~  hosp by Cards 9/10 for CP- she ruled out for MI & had f/u CT Angio- NEG for emboli (prev emboli resolved), & VenDopplers- NEG for DVT... ~  [place] 11/10= 49.4/ INR 4.8 (on Coumadin 5mg /d) & she's hi risk for bleed w/ hx DU & AVM- therefore coumadin stopped. ~  1/11:  Coumadin restarted by DrHochrein for PAF on Holter and elevated CHADS2 score, note: she is high risk for coumadin due to non-compliance, hx DU w/ bleed, & hx AVM in brain w/ SAH in 2001. ~  4/12:  Coumadin stopped during the 4/12 hospitalization by [facility] in consult w/ Cards- DrHochrein (Disch summary reviewed). ~  CXR 4/12 showed cardiomeg, clear lungs, NAD.Marland KitchenMarland Kitchen ~  CT Angio Chest 4/12 showed no PE,  scat atheroclerotic calcif in Ao & coronaries, basilar atx, DJD spine...  HYPERTENSION (ICD-401.9) - long hx of signif HBP w/ hosp for [facility] in 2001 by DrKritzer (due to an AVM)> on COREG 12.5mg - 1.5tabsBid, COZAAR 100mg /d, & AMLODIPINE 7.5mg /d;  previously on Toprol XL & Lotrel- developed an ACE cough; long hx non-compliance w/ med Rx- she prefers garlic and lemon juice!!!  ~  4/10:  BP= 160/90 today, pt supposed to be on Bisoprolol 2.5mg  daily, and  add Losartan 100mg /d. ~  7/10:  BP= 140/80 on Ziac2.5 + Cozaar100... reminded to take meds every day!!! ~  9/10:  BP= 132/82 on Ziac2.5 + Cozaar 100... continue same! ~  10/10: Hosp by Cards and meds changed- COREG 3.125Bid + COZAAR100- 1/2 tab/d...  ~  11/10:  BP today= 146/90 & states she doesn't feel well- rec incr Cozaar back to 100mg /d. ~  2/11:  BP= 154/90 & supposed to be on Coreg 6.25Bid & Losartan 100mg /d- Coreg incr to 12.5Bid. ~  9/11:  BP= 150/90 & pt reminded to take meds every day! ~  11/11:  DrHochrein up-titrated Coreg to 18.75mg  Bid, +Cozaar 100mg /d, BP= 142/84> ?compliance. ~  4/12:  BP= 132/76 & stable, continue same meds & remember to bring all meds to the office visits... ~  10/12:  BP= 160/88 & I suspect noncompliance w/ meds; encouraged to take all meds every day & bring bottles to every visit. ~  4/13:  BP= 142/80 (on Coreg, Norvasc, Cozaar), & she remains asymptomatic; denies CP, palpit, SOB, edema, etc... ~  10/13:  BP= 138/80 (on Coreg18.75, Norvasc7.5, Cozaar100) & she remains asymptomatic... ~  4/14: on Coreg12.5gm-1.5tabsBid, Norvasc5+2.5 & Cozaar100; BP= 134/90 & she denies CP, palpit, SOB, edema; reminded to elim salt & take meds every day. ~  11/14: BP controlled on Coreg12.5mg =1.5 tabs Bid, Amlod5+2.5 daily, Losar100;  BP= 128/72 & she denies CP, palpit, dizzy, SOB, edema.  CORONARY ARTERY DISEASE (ICD-414.00) & CARDIOMYOPATHY (ICD-425.4) - followed by DrHochrein for Cards> she is ACE intolerant w/ cough, and has long hx  of non-compliance w/ med Rx. ~  abn EKG w/ LBBB... ~  2DEcho 3/10 showed diffuse LV HK w/ EF~25%, mild calcif AoV, mild MR, mod dil LA & atrial septal aneurysm. ~  NuclearStressTest 2/10 showed cardiomyop w/ diffuse HK & EF~35%. ~  Cath 11/15/08 w/ EF= 30-35%, & non-obstructive CAD w/ 30-40% midLAD, 40-70% Diag branch of LAD, calcif in proxCIRC, & 30% midRCA. ~  Repeat 2DECho 7/10 showed infer AK, septal dysynergy, atrial septal aneurym, EF= 30-35%... ~  Hosp 10/10 by Cards & r/o for MI, meds adjusted as noted. ~  4/12:  Hosp by [facility] w/ atyp CP & r/o for MI> disch for Cards f/u and any further out pt work up... ~  9/13:  She had f/u DrHochrein & EKG showed SBrady, rate54, IVCD, LAD; he increased her Norvasc to 7.5mg /d. ~  Myoview 9/13 showed no evid scar or ischemia, EF+44%, HK in septum & infer wall... ~  11/14: CAD, Cardiomyop, PAF> followed by DrHochrein and due for Cards f/u, last seen 9/13 w/ EF=44% w/ HK & no ischemia on Myoview.  PAROXYSMAL ATRIAL FIBRILLATION (ICD-427.31) - eval for palpit by DrHochrein w/ PAF on Holter> he ordered Coumadin restarted 2/11 ==> it was discontinued again 4/12 Hosp...  HYPERCHOLESTEROLEMIA (ICD-272.0) - she refused meds in past & preferred Rx w/ red yeast rice, oatmeal, herbal remedies. ~  FLP 11/07 showed TChol 285, TG 155, HDL 80, LDL 165... obviously not taking med. ~  [place] 10/09 showed TChol 294, TG 205, HDL 78, LDL 157... she does not want to start statin Rx. ~  Discussed starting Rx in light of her cardiomyopathy & CAD on cath- she refuses statin Rx. ~  FLP 4/10 on diet showed TChol 211, TG 48, HDL 94, LDL 98... rec> keep up the great work! ~  [place] 2/11 on diet showed TChol 278, TG 303, HDL 74, LDL 154... rec> try PRAVACHOL40. ~  pt  admits to taking the Prav40 "only now & again"> asked to take it daily. ~  [place] 1/12 on Prav40 showed TChol 198, TG 175, HDL 71, LDL 92 ~  FLP 10/12 off meds showed TChol 273, TG 151, HDL 79, LDL 170 (goal<70) & she was referred  to [facility]id Clinic. ~  11/12: she established w/ the [facility]id Clinic & told them the only med she took regularly was the Coreg; diet noted to be poor, & counseled appropriately, asked to restart PRAV40- MWF but she never followed up w/ them... ~  4/13:  Pt advised on diet, exercise, & asked to restart PRAV40 as requested by [facility]; she was asked to sched f/u in the clinic to help lipid management... ~  FLP 10/13 showed TChol 254, TG 189, HDL 75, LDL 154... She is encouraged to f/u w/ LC... ~  Lipid Clinic started [facility] & has counseled her on diet as well... ~  [place] 9/14 on Zetia10 when she can get samples showed TChol 252, TG 242, HDL 75, LDL 136  DIABETES MELLITUS (ICD-250.00) - long hx of chronic non-compliance w/ med Rx ~  labs 10/09 showed BS= 319, HgA1c= 11.8.Marland KitchenMarland Kitchen re-started Metformin 500mg -2Bid + Glimepiride 4mg /d, but she c/o abd pain on the Metformin and stopped the Glimepiride on her own... referred to Endocrine. ~  DrEllison opted for Rx w/ ACTOS 45mg /d, & GLIMEPIRIDE 1mg - 1/2 tabAM (intol Metformin). ~  labs 4/10 showed BS= 102, A1c= 6.9.Marland KitchenMarland Kitchen continue same. ~  7/10: regulated in hosp w/ GLUCOPHAGE 500- 1/2Bid + GLIMEPIRIDE 1mg /d... labs showed BS= 144, A1c= 7.4. ~  9/10: labs showed BS= 270, A1c= 7.3.Marland KitchenMarland Kitchen prev intol to Metform500, therefore incr GLIMEPIRIDE 2mg Qam (she refused). ~  11/10: Bs= 201, A1c= 8.4.Marland KitchenMarland Kitchen rec try to incr Metform500Bid & Glimep2mg /d... ~  labs 2/11 showed BS= 188, A1c= 9.0 (hasn't filled Metform in months & ?taking Glimep1mg ?). ~  labs 6/11 showed BS= 238, A1c=9.4 (Pharm confirms not taking meds regularly)... rec> take meds daily!  NOTE: Creat=1.2, Umicroalb=pos. ~  followed by DrEllison every 3 months- he tried to add Januvia, she never filled the Rx. ~  labs 1/12 showed BS= 122, A1c= 8.5.Marland KitchenMarland Kitchen rec to take Metform500Bid, Glimep2mg /d, Januvia100mg /d. ~  4/12 Hosp showed BS= 111-310 & A1c= 7.6; disch back on Metform500Bid, Amaryl 2mg , ?Januvia100 (?if taking)... ~  Labs 10/12  showed BS= 206, A1c= 8.9 ?on Amaryl monotherapy> poor control & encouraged to f/u w/ DrEllison/ DM management. ~  4/13:  We reviewed her DM mamagement under the direction of DrEllison; recent A1c=9.4 & compliance w/ diet & meds is the major issue> discussed w/ pt. ~  10/13:  Supposed to be on [place], [place], [place].5- BS=161, A1c=9.1 and she will sched f/u w/ DrEllison... ~  DrEllison has stopped oral meds and switched to LANTUS insulin... ~  11/14: DM treated by DrEllison on Labntus 30u daily; Last A1c was 10/14 = 9.4 and she is rec to incr to 40u and f/u w/ Endocrine.  GERD (ICD-530.81) - previously on herbal remedies w/ prev eval from [place] in 1998...  ~  Hosp 6/10 w/ hematemesis & UGI bleed from DU +HPylori... eval by [facility] & Rx w/ OMPEPRAZOLE 40mg  & PYLERA (broken down to the generic Bismuth, Metronidazole, Tetracycline- she is Penicillin allergic) ~  GI f/u DrStark 9/10- HPylori eradicated, continue Omep40mg /d...  DIVERTICULOSIS OF COLON (ICD-562.10)  COLON POLYPS  ~  last procedure= FlexSig 1998 by DrStark showing divertics only... 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  NEPHROLITHIASIS & Mild RENAL INSUFFIC >> ~  Her renal function is normal w/ Creat = 1.1 - 1.2 range... ~  Labs 10/13 showed BUN= 23, Creat= 1.3  DEGENERATIVE JOINT DISEASE (ICD-715.90) - uses VICODIN Prn... ~  10/12:  She noted fall w/ ?ligament damage in right knee & treated by DrACarter w/ Vicodin...  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 sudden HA- eval revealed AVM &  conservative approach recommended by Neurosurgery w/ BP control... ~  She has been reminded on numerous occas about the importance of BP control, taking meds everyday to prevent recurrent ICH, stroke, etc...  NEUROPATHY (ICD-355.9)  ANXIETY (ICD-300.00)   Past Surgical History  Procedure Laterality Date  . Knee surgery      left  . Shoulder surgery      right  . Cataract extraction      Outpatient Encounter Prescriptions as of 10/20/2013  Medication Sig  . amLODipine (NORVASC) 2.5 MG tablet Take 2.5 mg by mouth daily.  Marland Kitchen amLODipine (NORVASC) 5 MG tablet Take 5 mg by mouth daily.  . Calcium Carb-Cholecalciferol (CALCIUM 600 + D) 600-200 MG-UNIT TABS Take 1 tablet by mouth daily.  . carvedilol (COREG) 12.5 MG tablet TAKE 1 & 1/2 TABLETS BY MOUTH 2 TIMES A DAY  . cholecalciferol (VITAMIN D) 400 UNITS TABS tablet Take 400 Units by mouth daily.  Marland Kitchen docusate sodium (COLACE) 100 MG capsule Take 1 capsule (100 mg total) by mouth every 12 (twelve) hours.  Marland Kitchen ezetimibe (ZETIA) 10 MG tablet Take 10 mg by mouth daily.  Marland Kitchen glucose blood (ONETOUCH VERIO) test strip And lancets 2/day 250.41  . HYDROcodone-acetaminophen (NORCO/VICODIN) 5-325 MG per tablet Take 1-2 tablets by mouth every 4 (four) hours as needed.  . insulin glargine (LANTUS) 100 UNIT/ML injection Inject 45 Units into the skin every morning. And pen needles 1/day  . losartan (COZAAR) 100 MG tablet TAKE 1 TABLET (100 MG TOTAL) BY MOUTH DAILY.    Allergies  Allergen Reactions  . Lisinopril     REACTION: INTOL to ACE's w/ cough  . Penicillins     REACTION: rash  . Statins     Muscle Aches    Current Medications, Allergies, Past Medical History, Past Surgical History, Family History, and Social History were reviewed in [employer] record.   Review of Systems         See HPI - all other systems neg except as noted... The patient complains of dyspnea on exertion.  The patient denies anorexia, fever, weight  loss, weight gain, vision loss, decreased hearing, hoarseness, chest pain, syncope, peripheral edema, prolonged cough, headaches, hemoptysis, abdominal pain, melena, hematochezia, severe indigestion/heartburn, hematuria, incontinence, muscle weakness,  transient blindness, difficulty walking, depression, unusual weight change, abnormal bleeding, enlarged lymph nodes, and angioedema.     Objective:   Physical Exam      WD, WN, 80 y/o BF in NAD... GENERAL:  Alert & oriented; pleasant & cooperative... HEENT:  [place]/AT, EOM-wnl, PERRLA, EACs-clear, TMs-wnl, NOSE-clear, THROAT-clear & wnl. NECK:  Supple w/ fairROM; no JVD; normal carotid impulses w/o bruits; no thyromegaly but sm nodule  palpated; no lymphadenopathy. CHEST:  Clear to P & A; without wheezes/ rales/ or rhonchi. HEART:  Regular Rhythm; gr 1/6 SEM, no rubs or gallops heard... ABDOMEN:  Soft & nontender; normal bowel sounds; no organomegaly or masses detected. EXT: without deformities, mild arthritic changes; no varicose veins/ venous insuffic/ or edema. NEURO:   gait normal & balance OK. DERM:  Along right axillae w/ small carbuncle noted, scant drainage noted, tender to touch, no sign surrounding redness.

## 2013-10-20 NOTE — Addendum Note (Signed)
Addended by: Parke Poisson E on: 10/20/2013 03:04 PM   Modules accepted: Orders

## 2013-10-20 NOTE — Assessment & Plan Note (Signed)
Right axillae carbuncle   Plan  Wash are with soap and water, pat day daily .  Apply warm moist compresses several times daily . If not resolving will need to call back for referral to surgeon for incision and drainage.  We sent wound culture , will call with results.  Call if develop redness, pain or fever.  Please contact office for sooner follow up if symptoms do not improve or worsen or seek emergency care

## 2013-10-20 NOTE — Patient Instructions (Addendum)
Wash are with soap and water, pat day daily .  Apply warm moist compresses several times daily . If not resolving will need to call back for referral to surgeon for incision and drainage.  We sent wound culture , will call with results.  Call if develop redness, pain or fever.  Please contact office for sooner follow up if symptoms do not improve or worsen or seek emergency care     Dr. Lenna Gilford is retiring and we will need to set you up with a new Primary Care provider.  Please let us know if you would like [place] to assist with this referral.

## 2013-10-23 LAB — WOUND CULTURE
Gram Stain: NONE SEEN
Organism ID, Bacteria: NO GROWTH

## 2013-10-24 NOTE — Progress Notes (Signed)
Quick Note:  Called spoke with patient, advised of lab results / recs as stated by TP. Pt verbalized her understanding and denied any questions. ______ 

## 2013-11-15 ENCOUNTER — Telehealth: Payer: Self-pay

## 2013-11-15 ENCOUNTER — Telehealth: Payer: Self-pay | Admitting: Cardiology

## 2013-11-15 NOTE — Telephone Encounter (Signed)
i would be happy to change to [facility] insulin, which is much cheaper.  Ok?

## 2013-11-15 NOTE — Telephone Encounter (Signed)
Case worker with [employer] called stating the the pt has been out of her insulin for about a month. Pt states that she could not afford the medication and has been taking Bitter melon to help with Blood sugar. Case worker is in the process of trying to get the pt on an assistant program so her medication will be cheaper. Pt is coming in for appointment on 12/01/2013.  Wanted Dr to be notified.

## 2013-11-15 NOTE — Telephone Encounter (Signed)
New message      FYI Pt had an "episode" Sunday of chest pain--pain rate was an 8.  No sob, jaw pain or other symptoms.  The pain went away within 19min.  She has not had any chest pain since Sunday.  She does not have nitro.  She took her bp one day last week at a drug store and it was 160/91. Just want to let the doctor know.  Nurse will fax this in a report today.

## 2013-11-15 NOTE — Telephone Encounter (Signed)
Requested call back.  

## 2013-11-15 NOTE — Telephone Encounter (Signed)
Called to speak with pt about the chest pain she had and her BP.  Pt states on Sunday she had 2 spells where she felt her heart speed up and it caused her to have a "spreading ache across her chest and up into throat."  She denies any other radiation or N/V.  She reports she can at times bring this feeling on with walking up a lot of stairs or walking quickly or up an incline.  She will have SOB when this occurs.  She feels like her HR is fast and irregular but doesn't know her HR or how to check it.  It resolves with rest, usually about 30 mins.  She feels it "fading away."  She it taking Amlodipine 7.5 mg a day, carvedilol 12.5 mg 1 and 1/2 tablets twice a day and losartan 100 mg a day.  Her BP was elevated per her report the last time she checked it at the pharmacy however she had not taken any of her medication that day.  Advised she needs to take meds as RXed and that MD will not be able to adjust medications for the treatment of her HTN without 1) knowing the effect of the medications on her BP and 2) we need more than 1 BP reading.  Advised pt to take BP daily about an hour after she has had her medications.  She states understanding.  Of note- she was to have had a lexiscan ordered by Ermalinda Barrios but pt wanted to wait until after she had increased her carvediolol as ordered.  She has since seen [place] back in the office to follow up and it was decided to wait on further testing.  Pt is aware I will forward this information to Dr Percival Spanish for his review and call her back with any orders or recommendations.

## 2013-11-16 NOTE — Telephone Encounter (Signed)
Left detailed message for vicki

## 2013-11-16 NOTE — Telephone Encounter (Signed)
Note reviewed.  No change in therapy.  Continue to record BPs.

## 2013-11-16 NOTE — Telephone Encounter (Signed)
Requested call back.  

## 2013-11-21 NOTE — Telephone Encounter (Signed)
Was able to reach pt and she states she would be ok with changing to [facility] insulin.

## 2013-11-22 ENCOUNTER — Telehealth: Payer: Self-pay | Admitting: Endocrinology

## 2013-11-22 NOTE — Telephone Encounter (Signed)
Pt states that she was ok to have her Lantus called in at [facility] is not aware of which [facility] into  Can you advise please    Thank You :)  P.S. We miss you

## 2013-11-23 NOTE — Telephone Encounter (Signed)
Message sent to MD waiting on response

## 2013-11-23 NOTE — Telephone Encounter (Signed)
Contacted pt and she states she would be ok with changing to [facility] insulin.

## 2013-11-24 NOTE — Telephone Encounter (Signed)
please call patient: Pt has appt next week.  We'll address then

## 2013-11-27 NOTE — Telephone Encounter (Signed)
Noted, left message informing pt.

## 2013-12-01 ENCOUNTER — Ambulatory Visit (INDEPENDENT_AMBULATORY_CARE_PROVIDER_SITE_OTHER): Payer: Medicare Other | Admitting: Endocrinology

## 2013-12-01 ENCOUNTER — Encounter: Payer: Self-pay | Admitting: Endocrinology

## 2013-12-01 VITALS — BP 130/90 | HR 59 | Temp 97.9°F | Ht 62.0 in | Wt 120.0 lb

## 2013-12-01 DIAGNOSIS — E1129 Type 2 diabetes mellitus with other diabetic kidney complication: Secondary | ICD-10-CM

## 2013-12-01 DIAGNOSIS — E1165 Type 2 diabetes mellitus with hyperglycemia: Principal | ICD-10-CM

## 2013-12-01 LAB — HEMOGLOBIN A1C: Hgb A1c MFr Bld: 8.7 % — ABNORMAL HIGH (ref 4.6–6.5)

## 2013-12-01 NOTE — Progress Notes (Signed)
Subjective:    Patient ID: [name], female    DOB: 11-28-1927, 80 y.o.   MRN: [id number]  HPI Pt returns for f/u of type 2 DM (dx'ed 1992, after a beesting; she has moderate neuropathy of the lower extremities; she has associated TIA, CAD, and renal insufficiency; oral rx was limited by renal insufficiency, so she was started on insulin in early 2014; she was willing to take as often as qd only; she has never had severe hypoglycemia or DKA).  no cbg record, but states cbg's vary from 118-184.  There is no trend throughout the day, but it is lowest after a missed meal.   Past Medical History  Diagnosis Date  . Pulmonary embolism   . Hypertension   . CAD (coronary artery disease)   . Cardiomyopathy   . Palpitation   . Paroxysmal atrial fibrillation   . Hypercholesterolemia   . Diabetes mellitus   . Nontoxic multinodular goiter   . Pure hypercholesterolemia   . Peptic ulcer, unspecified site, unspecified as acute or chronic, without mention of hemorrhage, perforation, or obstruction   . GERD (gastroesophageal reflux disease)   . Benign neoplasm of adrenal gland   . Diverticulosis of colon (without mention of hemorrhage)   . Calculus of kidney   . 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 DJD (degenerative joint disease)   . Unspecified transient cerebral ischemia   . Anxiety   . Neuropathy   . Unspecified transient cerebral ischemia   . H. pylori infection     Past Surgical History  Procedure Laterality Date  . Knee surgery      left  . Shoulder surgery      right  . Cataract extraction      History   Social History  . Marital Status: Widowed    Spouse Name: N/A    Number of Children: 1  . Years of Education: N/A   Occupational History  . retired    Social History Main Topics  . Smoking status: Former Smoker -- 0.30 packs/day for 36 years    Types: Cigarettes    Quit date: 08/18/1983  . Smokeless tobacco: Never Used    . Alcohol Use: Yes     Comment: occ wine, beer during holidays or at social events  . Drug Use: No  . Sexual Activity: Not on file   Other Topics Concern  . Not on file   Social History Narrative  . No narrative on file    Current Outpatient Prescriptions on File Prior to Visit  Medication Sig Dispense Refill  . amLODipine (NORVASC) 2.5 MG tablet Take 2.5 mg by mouth daily.      Marland Kitchen amLODipine (NORVASC) 5 MG tablet Take 5 mg by mouth daily.      . Calcium Carb-Cholecalciferol (CALCIUM 600 + D) 600-200 MG-UNIT TABS Take 1 tablet by mouth daily.      . carvedilol (COREG) 12.5 MG tablet TAKE 1 & 1/2 TABLETS BY MOUTH 2 TIMES A DAY  90 tablet  0  . cholecalciferol (VITAMIN D) 400 UNITS TABS tablet Take 400 Units by mouth daily.      Marland Kitchen ezetimibe (ZETIA) 10 MG tablet Take 10 mg by mouth daily.      Marland Kitchen glucose blood (ONETOUCH VERIO) test strip And lancets 2/day 250.41  100 each  3  . HYDROcodone-acetaminophen (NORCO/VICODIN) 5-325 MG per tablet Take 1-2 tablets by mouth every 4 (four) hours as needed.  15 tablet  0  . insulin glargine (LANTUS) 100 UNIT/ML injection Inject 45 Units into the skin every morning. And pen needles 1/day      . losartan (COZAAR) 100 MG tablet TAKE 1 TABLET (100 MG TOTAL) BY MOUTH DAILY.  30 tablet  6   No current facility-administered medications on file prior to visit.    Allergies  Allergen Reactions  . Lisinopril     REACTION: INTOL to ACE's w/ cough  . Penicillins     REACTION: rash  . Statins     Muscle Aches    Family History  Problem Relation Age of Onset  . Diabetes Maternal Grandmother   . Heart disease Maternal Aunt     x2  . Lung cancer Mother   . Brain cancer Mother   . Goiter Mother   . Colon cancer Neg Hx     BP 130/90  Pulse 59  Temp(Src) 97.9 F (36.6 C) (Oral)  Ht 5\' 2"  (1.575 m)  Wt 120 lb (54.432 kg)  BMI 21.94 kg/m2  SpO2 96%  Review of Systems She denies hypoglycemia and weight change.      Objective:   Physical  Exam VITAL SIGNS:  See vs page GENERAL: no distress SKIN:  Insulin injection sites at the anterior abdomen are normal.  Lab Results  Component Value Date   HGBA1C 8.7* 12/01/2013      Assessment & Plan:  DM: therapy limited by pt's need for a simple regimen.  She needs increased rx, but she also needs to avoid hypoglycemia by not missing meals.  CAD: in this context, she should avoid hypoglycemia.

## 2013-12-01 NOTE — Patient Instructions (Addendum)
Please make a follow-up appointment in 3 months.  check your blood sugar twice a day.  vary the time of day when you check, between before the 3 meals, and at bedtime.  also check if you have symptoms of your blood sugar being too high or too low.  please keep a record of the readings and bring it to your next appointment here.  please call us sooner if you are having low blood sugar episodes.  A diabetes blood test is requested for you today.  We'll contact you with results.  On this type of insulin schedule, you should eat meals on a regular schedule.  If a meal is missed or significantly delayed, your blood sugar could go low.

## 2013-12-18 ENCOUNTER — Ambulatory Visit: Payer: Medicare Other | Admitting: Pulmonary Disease

## 2013-12-25 ENCOUNTER — Ambulatory Visit (INDEPENDENT_AMBULATORY_CARE_PROVIDER_SITE_OTHER): Payer: Medicare Other | Admitting: Cardiology

## 2013-12-25 ENCOUNTER — Encounter: Payer: Self-pay | Admitting: Cardiology

## 2013-12-25 VITALS — BP 167/81 | HR 61 | Ht 62.0 in | Wt 121.0 lb

## 2013-12-25 DIAGNOSIS — I428 Other cardiomyopathies: Secondary | ICD-10-CM

## 2013-12-25 DIAGNOSIS — I10 Essential (primary) hypertension: Secondary | ICD-10-CM

## 2013-12-25 DIAGNOSIS — I43 Cardiomyopathy in diseases classified elsewhere: Secondary | ICD-10-CM

## 2013-12-25 DIAGNOSIS — I2510 Atherosclerotic heart disease of native coronary artery without angina pectoris: Secondary | ICD-10-CM

## 2013-12-25 DIAGNOSIS — I4891 Unspecified atrial fibrillation: Secondary | ICD-10-CM

## 2013-12-25 DIAGNOSIS — I119 Hypertensive heart disease without heart failure: Secondary | ICD-10-CM

## 2013-12-25 NOTE — Patient Instructions (Signed)
Your physician has requested that you have an echocardiogram. Echocardiography is a painless test that uses sound waves to create images of your heart. It provides your doctor with information about the size and shape of your heart and how well your heart's chambers and valves are working. This procedure takes approximately one hour. There are no restrictions for this procedure.  Your physician recommends that you continue on your current medications as directed. Please refer to the Current Medication list given to you today.  Your physician wants you to follow-up in: [place] [address]. [place]. You will receive a reminder letter in the mail two months in advance. If you don't receive a letter, please call our office to schedule the follow-up appointment.

## 2013-12-25 NOTE — Progress Notes (Signed)
HPI The patient presents for evaluation of chest discomfort.  She has recently seen Ermalinda Barrios.  She was having some chest pain or palpitations but she was not taking her beta blocker twice daily. When this was increased to the appropriate dose and she says her symptoms have. She says she will get a rapid heart rate if she walks up an incline but this has not changed from previous. Otherwise she is able to do her chores of daily living. She denies any chest pressure, neck or arm discomfort. She denies any palpitations other than when she is active. She's not having any PND or orthopnea. She's not having any weight gain or edema.    Allergies  Allergen Reactions  . Lisinopril     REACTION: INTOL to ACE's w/ cough  . Penicillins     REACTION: rash  . Statins     Muscle Aches    Current Outpatient Prescriptions  Medication Sig Dispense Refill  . amLODipine (NORVASC) 2.5 MG tablet Take 2.5 mg by mouth daily.      Marland Kitchen amLODipine (NORVASC) 5 MG tablet Take 5 mg by mouth daily.      . Calcium Carb-Cholecalciferol (CALCIUM 600 + D) 600-200 MG-UNIT TABS Take 1 tablet by mouth daily.      . carvedilol (COREG) 12.5 MG tablet TAKE 1 & 1/2 TABLETS BY MOUTH 2 TIMES A DAY  90 tablet  0  . cholecalciferol (VITAMIN D) 400 UNITS TABS tablet Take 400 Units by mouth daily.      Marland Kitchen ezetimibe (ZETIA) 10 MG tablet Take 10 mg by mouth daily.      Marland Kitchen glucose blood (ONETOUCH VERIO) test strip And lancets 2/day 250.41  100 each  3  . HYDROcodone-acetaminophen (NORCO/VICODIN) 5-325 MG per tablet Take 1-2 tablets by mouth every 4 (four) hours as needed.  15 tablet  0  . insulin glargine (LANTUS) 100 UNIT/ML injection Inject 45 Units into the skin every morning. And pen needles 1/day      . losartan (COZAAR) 100 MG tablet TAKE 1 TABLET (100 MG TOTAL) BY MOUTH DAILY.  30 tablet  6   No current facility-administered medications for this visit.    Past Medical History  Diagnosis Date  . Pulmonary embolism   .  Hypertension   . CAD (coronary artery disease)   . Cardiomyopathy   . Palpitation   . Paroxysmal atrial fibrillation   . Hypercholesterolemia   . Diabetes mellitus   . Nontoxic multinodular goiter   . Pure hypercholesterolemia   . Peptic ulcer, unspecified site, unspecified as acute or chronic, without mention of hemorrhage, perforation, or obstruction   . GERD (gastroesophageal reflux disease)   . Benign neoplasm of adrenal gland   . Diverticulosis of colon (without mention of hemorrhage)   . Calculus of kidney   . 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 DJD (degenerative joint disease)   . Unspecified transient cerebral ischemia   . Anxiety   . Neuropathy   . Unspecified transient cerebral ischemia   . H. pylori infection     Past Surgical History  Procedure Laterality Date  . Knee surgery      left  . Shoulder surgery      right  . Cataract extraction      ROS:  As stated in the HPI and negative for all other systems.  PHYSICAL EXAM BP 167/81  Pulse 61  Ht 5\' 2"  (1.575 m)  Wt 121 lb (54.885 kg)  BMI 22.13 kg/m2 GENERAL:  Well appearing HEENT:  Pupils equal round and reactive, fundi not visualized, oral mucosa unremarkable NECK:  No jugular venous distention, waveform within normal limits, carotid upstroke brisk and symmetric, no bruits, no thyromegaly LYMPHATICS:  No cervical, inguinal adenopathy LUNGS:  Clear to auscultation bilaterally BACK:  No CVA tenderness CHEST:  Unremarkable HEART:  PMI not displaced or sustained, positive right ventricular lift,S1 and S2 within normal limits, no S3, no S4, no clicks, no rubs, no murmurs ABD:  Flat, positive bowel sounds normal in frequency in pitch, no bruits, no rebound, no guarding, no midline pulsatile mass, no hepatomegaly, no splenomegaly EXT:  2 plus pulses throughout, no edema, no cyanosis no clubbing SKIN:  No rashes no nodules NEURO:  Cranial nerves II through XII grossly  intact, motor grossly intact throughout PSYCH:  Cognitively intact, oriented to person place and time   ASSESSMENT AND PLAN   CHEST PAIN The patient did have nonobstructive coronary disease on cath in 2010. She had higher grade small vessel disease. She is having no further symptoms since she increased her beta blocker. No further ischemia evaluation is planned. Her last perfusion study I reviewed was in 2013.  CARDIOMYOPATHY  I will check her EF with another echocardiogram. She agrees to come back for this.  Of note she has a more prominent RV lift and I have appreciated but no other evidence of right ventricular failure or pulmonary hypertension on exam.  HYPERTENSION  In the past she has been reluctant to take more medications.  She says that her blood pressure is up because of the "crazy drivers". She says will be down at home and it is not typically is elevated here.  RENAL INSUFFICIENCY  No further evaluation is warrented at this point.  Her last creatinine was 1.5.

## 2013-12-30 ENCOUNTER — Other Ambulatory Visit: Payer: Self-pay | Admitting: Pulmonary Disease

## 2014-01-10 ENCOUNTER — Ambulatory Visit (HOSPITAL_COMMUNITY): Payer: Medicare Other | Attending: Cardiovascular Disease | Admitting: Radiology

## 2014-01-10 DIAGNOSIS — I2510 Atherosclerotic heart disease of native coronary artery without angina pectoris: Secondary | ICD-10-CM

## 2014-01-10 DIAGNOSIS — I428 Other cardiomyopathies: Secondary | ICD-10-CM

## 2014-01-10 DIAGNOSIS — R072 Precordial pain: Secondary | ICD-10-CM

## 2014-01-10 DIAGNOSIS — I119 Hypertensive heart disease without heart failure: Secondary | ICD-10-CM | POA: Insufficient documentation

## 2014-01-10 DIAGNOSIS — I43 Cardiomyopathy in diseases classified elsewhere: Secondary | ICD-10-CM | POA: Insufficient documentation

## 2014-01-10 NOTE — Progress Notes (Signed)
Echocardiogram performed.  

## 2014-01-19 ENCOUNTER — Other Ambulatory Visit: Payer: Self-pay | Admitting: *Deleted

## 2014-01-19 MED ORDER — CARVEDILOL 12.5 MG PO TABS: ORAL_TABLET | ORAL | Status: DC

## 2014-02-12 ENCOUNTER — Ambulatory Visit: Payer: Medicare Other | Admitting: Internal Medicine

## 2014-02-16 ENCOUNTER — Other Ambulatory Visit: Payer: Self-pay | Admitting: Endocrinology

## 2014-02-19 ENCOUNTER — Other Ambulatory Visit: Payer: Self-pay | Admitting: Pulmonary Disease

## 2014-02-19 MED ORDER — AMLODIPINE BESYLATE 5 MG PO TABS: ORAL_TABLET | ORAL | Status: DC

## 2014-02-19 MED ORDER — LOSARTAN POTASSIUM 100 MG PO TABS: ORAL_TABLET | ORAL | Status: DC

## 2014-03-02 ENCOUNTER — Ambulatory Visit (INDEPENDENT_AMBULATORY_CARE_PROVIDER_SITE_OTHER): Payer: Medicare Other | Admitting: Endocrinology

## 2014-03-02 ENCOUNTER — Encounter: Payer: Self-pay | Admitting: Endocrinology

## 2014-03-02 VITALS — BP 136/94 | HR 67 | Temp 98.0°F | Ht 62.0 in | Wt 120.0 lb

## 2014-03-02 DIAGNOSIS — E1129 Type 2 diabetes mellitus with other diabetic kidney complication: Secondary | ICD-10-CM

## 2014-03-02 DIAGNOSIS — E1165 Type 2 diabetes mellitus with hyperglycemia: Principal | ICD-10-CM

## 2014-03-02 LAB — HEMOGLOBIN A1C: Hgb A1c MFr Bld: 8.6 % — ABNORMAL HIGH (ref 4.6–6.5)

## 2014-03-02 MED ORDER — SITAGLIPTIN PHOSPHATE 100 MG PO TABS: ORAL_TABLET | ORAL | Status: DC

## 2014-03-02 NOTE — Patient Instructions (Addendum)
Please make a follow-up appointment in 3 months.  check your blood sugar twice a day.  vary the time of day when you check, between before the 3 meals, and at bedtime.  also check if you have symptoms of your blood sugar being too high or too low.  please keep a record of the readings and bring it to your next appointment here.  please call us sooner if you are having low blood sugar episodes.  A diabetes blood test is requested for you today.  We'll contact you with results.  If it is high, i'll prescribe for you "januvia."

## 2014-03-02 NOTE — Progress Notes (Signed)
Subjective:    Patient ID: [name], female    DOB: 02-28-1928, 80 y.o.   MRN: [id number]  HPI Pt returns for f/u of type 2 DM (dx'ed 1992, after a beesting; she has moderate neuropathy of the lower extremities; she has associated TIA, CAD, and renal insufficiency; oral rx was limited by renal insufficiency, so she was started on insulin in early 2014; she is willing to take as often as qd only; she has never had severe hypoglycemia, pancreatitis, or DKA).  Pt stopped taking insulin, due to cost.  i offered syringe and vial at last ov, but she declined. She drinks indian tea, which she says helps her cbg's.  no cbg record, but states cbg's vary from 100-313.  There is no trend throughout the day.   Past Medical History  Diagnosis Date  . Pulmonary embolism   . Hypertension   . CAD (coronary artery disease)   . Cardiomyopathy   . Palpitation   . Paroxysmal atrial fibrillation   . Hypercholesterolemia   . Diabetes mellitus   . Nontoxic multinodular goiter   . Pure hypercholesterolemia   . Peptic ulcer, unspecified site, unspecified as acute or chronic, without mention of hemorrhage, perforation, or obstruction   . GERD (gastroesophageal reflux disease)   . Benign neoplasm of adrenal gland   . Diverticulosis of colon (without mention of hemorrhage)   . Calculus of kidney   . 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 DJD (degenerative joint disease)   . Unspecified transient cerebral ischemia   . Anxiety   . Neuropathy   . Unspecified transient cerebral ischemia   . H. pylori infection     Past Surgical History  Procedure Laterality Date  . Knee surgery      left  . Shoulder surgery      right  . Cataract extraction      History   Social History  . Marital Status: Widowed    Spouse Name: N/A    Number of Children: 1  . Years of Education: N/A   Occupational History  . retired    Social History Main Topics  . Smoking status:  Former Smoker -- 0.30 packs/day for 36 years    Types: Cigarettes    Quit date: 08/18/1983  . Smokeless tobacco: Never Used  . Alcohol Use: Yes     Comment: occ wine, beer during holidays or at social events  . Drug Use: No  . Sexual Activity: Not on file   Other Topics Concern  . Not on file   Social History Narrative  . No narrative on file    Current Outpatient Prescriptions on File Prior to Visit  Medication Sig Dispense Refill  . amLODipine (NORVASC) 5 MG tablet TAKE 1 TABLET BY MOUTH EVERY DAY  30 tablet  2  . Calcium Carb-Cholecalciferol (CALCIUM 600 + D) 600-200 MG-UNIT TABS Take 1 tablet by mouth daily.      . carvedilol (COREG) 12.5 MG tablet TAKE 1 & 1/2 TABLETS BY MOUTH 2 TIMES A DAY  90 tablet  5  . cholecalciferol (VITAMIN D) 400 UNITS TABS tablet Take 400 Units by mouth daily.      Marland Kitchen ezetimibe (ZETIA) 10 MG tablet Take 10 mg by mouth daily.      Marland Kitchen HYDROcodone-acetaminophen (NORCO/VICODIN) 5-325 MG per tablet Take 1-2 tablets by mouth every 4 (four) hours as needed.  15 tablet  0  . losartan (COZAAR) 100 MG  tablet TAKE 1 TABLET (100 MG TOTAL) BY MOUTH DAILY.  30 tablet  2  . ONETOUCH VERIO test strip TEST 2 TIMES A DAY  100 each  2   No current facility-administered medications on file prior to visit.    Allergies  Allergen Reactions  . Lisinopril     REACTION: INTOL to ACE's w/ cough  . Penicillins     REACTION: rash  . Statins     Muscle Aches    Family History  Problem Relation Age of Onset  . Diabetes Maternal Grandmother   . Heart disease Maternal Aunt     x2  . Lung cancer Mother   . Brain cancer Mother   . Goiter Mother   . Colon cancer Neg Hx     BP 136/94  Pulse 67  Temp(Src) 98 F (36.7 C) (Oral)  Ht 5\' 2"  (1.575 m)  Wt 120 lb (54.432 kg)  BMI 21.94 kg/m2  SpO2 96%    Review of Systems She denies hypoglycemia and weight change.      Objective:   Physical Exam VITAL SIGNS:  See vs page GENERAL: no distress Pulses: dorsalis  pedis intact bilat.   Feet: no deformity. normal color and temp.  no edema Skin:  no ulcer on the feet.   Neuro: sensation is intact to touch on the feet   Lab Results  Component Value Date   HGBA1C 8.6* 03/02/2014      Assessment & Plan:  DM: moderate exacerbation Noncompliance with cbg recording: I'll work around this as best I can.   Patient is advised the following: Patient Instructions  Please make a follow-up appointment in 3 months.  check your blood sugar twice a day.  vary the time of day when you check, between before the 3 meals, and at bedtime.  also check if you have symptoms of your blood sugar being too high or too low.  please keep a record of the readings and bring it to your next appointment here.  please call us sooner if you are having low blood sugar episodes.  A diabetes blood test is requested for you today.  We'll contact you with results.  If it is high, i'll prescribe for you "januvia."

## 2014-03-06 ENCOUNTER — Telehealth: Payer: Self-pay | Admitting: *Deleted

## 2014-03-06 MED ORDER — NATEGLINIDE 120 MG PO TABS: 120.0000 mg | ORAL_TABLET | Freq: Three times a day (TID) | ORAL | Status: DC

## 2014-03-06 NOTE — Telephone Encounter (Signed)
Pt advised via voicemail. 

## 2014-03-06 NOTE — Telephone Encounter (Signed)
Patient wants to know if there is a cheaper medication that the Januvia, she said it's too expensive.  Please advise CB# [phone]

## 2014-03-06 NOTE — Telephone Encounter (Signed)
See below and please advise, Thanks!  

## 2014-03-06 NOTE — Telephone Encounter (Signed)
Ok, i have sent a prescription to your pharmacy.  This one you take with each meal.

## 2014-03-28 ENCOUNTER — Encounter: Payer: Self-pay | Admitting: Internal Medicine

## 2014-03-28 ENCOUNTER — Ambulatory Visit (INDEPENDENT_AMBULATORY_CARE_PROVIDER_SITE_OTHER): Payer: Medicare Other | Admitting: Internal Medicine

## 2014-03-28 ENCOUNTER — Telehealth: Payer: Self-pay | Admitting: Internal Medicine

## 2014-03-28 VITALS — BP 185/84 | HR 50 | Temp 97.4°F | Ht 62.0 in | Wt 123.5 lb

## 2014-03-28 DIAGNOSIS — Z0000 Encounter for general adult medical examination without abnormal findings: Secondary | ICD-10-CM

## 2014-03-28 DIAGNOSIS — Z9119 Patient's noncompliance with other medical treatment and regimen: Secondary | ICD-10-CM

## 2014-03-28 DIAGNOSIS — Z23 Encounter for immunization: Secondary | ICD-10-CM

## 2014-03-28 DIAGNOSIS — E042 Nontoxic multinodular goiter: Secondary | ICD-10-CM

## 2014-03-28 DIAGNOSIS — I10 Essential (primary) hypertension: Secondary | ICD-10-CM

## 2014-03-28 DIAGNOSIS — E1165 Type 2 diabetes mellitus with hyperglycemia: Secondary | ICD-10-CM

## 2014-03-28 DIAGNOSIS — Z91199 Patient's noncompliance with other medical treatment and regimen due to unspecified reason: Secondary | ICD-10-CM

## 2014-03-28 DIAGNOSIS — E7800 Pure hypercholesterolemia, unspecified: Secondary | ICD-10-CM

## 2014-03-28 DIAGNOSIS — E1129 Type 2 diabetes mellitus with other diabetic kidney complication: Secondary | ICD-10-CM

## 2014-03-28 MED ORDER — AMLODIPINE BESYLATE 5 MG PO TABS: 5.0000 mg | ORAL_TABLET | Freq: Every day | ORAL | Status: DC

## 2014-03-28 MED ORDER — METOPROLOL SUCCINATE ER 50 MG PO TB24: 50.0000 mg | ORAL_TABLET | Freq: Every day | ORAL | Status: DC

## 2014-03-28 MED ORDER — AMLODIPINE BESYLATE 2.5 MG PO TABS
2.5000 mg | ORAL_TABLET | Freq: Every day | ORAL | Status: DC
Start: 2014-03-28 — End: 2014-09-21

## 2014-03-28 MED ORDER — PNEUMOCOCCAL VAC POLYVALENT 25 MCG/0.5ML IJ INJ: 0.5000 mL | INJECTION | Freq: Once | INTRAMUSCULAR | Status: DC

## 2014-03-28 MED ORDER — LOSARTAN POTASSIUM 100 MG PO TABS: 100.0000 mg | ORAL_TABLET | Freq: Every day | ORAL | Status: DC

## 2014-03-28 MED ORDER — EZETIMIBE 10 MG PO TABS: 10.0000 mg | ORAL_TABLET | Freq: Every day | ORAL | Status: DC

## 2014-03-28 NOTE — Progress Notes (Signed)
Pre visit review using our clinic review tool, if applicable. No additional management support is needed unless otherwise documented below in the visit note. 

## 2014-03-28 NOTE — Assessment & Plan Note (Signed)
Multinod Thyroid> size appears unchanged  TSH 10/13 was 1.53 ultrasound 3/11 showed mult nodules, diffuse inhomogeneous echotexture & dominant left loer pole nodule- Bx= hyperplastic nodule.Marland KitchenMarland Kitchen

## 2014-03-28 NOTE — Assessment & Plan Note (Signed)
Intol to statin and refusal to take same documented Prev on Zetia, but no longer compliance with same due to lack of samples encouraged diet control of same nd to follow up with lipid clinic on same Prefers "bitter melon [place] tea", cucumbers, garlic, cinnamon, lemon juice and vinegar to treat her cardiac risk factors

## 2014-03-28 NOTE — Patient Instructions (Addendum)
It was good to see you today.  We have reviewed your prior records including labs and tests today  Health Maintenance reviewed - pneumonia vaccine updated today - all recommended immunizations and age-appropriate screenings are up-to-date or declined  Medications reviewed and updated Will change carvedilol to extended release metoprolol for blood pressure control - Your prescription(s) have been submitted to your pharmacy. Please take as directed and contact our office if you believe you are having problem(s) with the medication(s).  I strongly recommend you TAKE ALL MEDICATIONS as we reviewed today  Continue working with your specialists as reviewed today  Please schedule followup in 12 months for annual exam and labs, call sooner if problems.  Health Maintenance Adopting a healthy lifestyle and getting preventive care can go a long way to promote health and wellness. Talk with your health care provider about what schedule of regular examinations is right for you. This is a good chance for you to check in with your provider about disease prevention and staying healthy. In between checkups, there are plenty of things you can do on your own. Experts have done a lot of research about which lifestyle changes and preventive measures are most likely to keep you healthy. Ask your health care provider for more information. WEIGHT AND DIET  Eat a healthy diet  Be sure to include plenty of vegetables, fruits, low-fat dairy products, and lean protein.  Do not eat a lot of foods high in solid fats, added sugars, or salt.  Get regular exercise. This is one of the most important things you can do for your health.  Most adults should exercise for at least 150 minutes each week. The exercise should increase your heart rate and make you sweat (moderate-intensity exercise).  Most adults should also do strengthening exercises at least twice a week. This is in addition to the moderate-intensity exercise.   Maintain a healthy weight  Body mass index (BMI) is a measurement that can be used to identify possible weight problems. It estimates body fat based on height and weight. Your health care provider can help determine your BMI and help you achieve or maintain a healthy weight.  For females 28 years of age and older:   A BMI below 18.5 is considered underweight.  A BMI of 18.5 to 24.9 is normal.  A BMI of 25 to 29.9 is considered overweight.  A BMI of 30 and above is considered obese.  Watch levels of cholesterol and blood lipids  You should start having your blood tested for lipids and cholesterol at 78 years of age, then have this test every 5 years.  You may need to have your cholesterol levels checked more often if:  Your lipid or cholesterol levels are high.  You are older than 78 years of age.  You are at high risk for heart disease.  CANCER SCREENING   Lung Cancer  Lung cancer screening is recommended for adults 34-35 years old who are at high risk for lung cancer because of a history of smoking.  A yearly low-dose CT scan of the lungs is recommended for people who:  Currently smoke.  Have quit within the past 15 years.  Have at least a 30-pack-year history of smoking. A pack year is smoking an average of one pack of cigarettes a day for 1 year.  Yearly screening should continue until it has been 15 years since you quit.  Yearly screening should stop if you develop a health problem that would prevent  you from having lung cancer treatment.  Breast Cancer  Practice breast self-awareness. This means understanding how your breasts normally appear and feel.  It also means doing regular breast self-exams. Let your health care provider know about any changes, no matter how small.  If you are in your 20s or 30s, you should have a clinical breast exam (CBE) by a health care provider every 1-3 years as part of a regular health exam.  If you are 30 or older, have a  CBE every year. Also consider having a breast X-ray (mammogram) every year.  If you have a family history of breast cancer, talk to your health care provider about genetic screening.  If you are at high risk for breast cancer, talk to your health care provider about having an MRI and a mammogram every year.  Breast cancer gene (BRCA) assessment is recommended for women who have family members with BRCA-related cancers. BRCA-related cancers include:  Breast.  Ovarian.  Tubal.  Peritoneal cancers.  Results of the assessment will determine the need for genetic counseling and BRCA1 and BRCA2 testing. Cervical Cancer Routine pelvic examinations to screen for cervical cancer are no longer recommended for nonpregnant women who are considered low risk for cancer of the pelvic organs (ovaries, uterus, and vagina) and who do not have symptoms. A pelvic examination may be necessary if you have symptoms including those associated with pelvic infections. Ask your health care provider if a screening pelvic exam is right for you.   The Pap test is the screening test for cervical cancer for women who are considered at risk.  If you had a hysterectomy for a problem that was not cancer or a condition that could lead to cancer, then you no longer need Pap tests.  If you are older than 65 years, and you have had normal Pap tests for the past 10 years, you no longer need to have Pap tests.  If you have had past treatment for cervical cancer or a condition that could lead to cancer, you need Pap tests and screening for cancer for at least 20 years after your treatment.  If you no longer get a Pap test, assess your risk factors if they change (such as having a new sexual partner). This can affect whether you should start being screened again.  Some women have medical problems that increase their chance of getting cervical cancer. If this is the case for you, your health care provider may recommend more  frequent screening and Pap tests.  The human papillomavirus (HPV) test is another test that may be used for cervical cancer screening. The HPV test looks for the virus that can cause cell changes in the cervix. The cells collected during the Pap test can be tested for HPV.  The HPV test can be used to screen women 58 years of age and older. Getting tested for HPV can extend the interval between normal Pap tests from three to five years.  An HPV test also should be used to screen women of any age who have unclear Pap test results.  After 78 years of age, women should have HPV testing as often as Pap tests.  Colorectal Cancer  This type of cancer can be detected and often prevented.  Routine colorectal cancer screening usually begins at 78 years of age and continues through 78 years of age.  Your health care provider may recommend screening at an earlier age if you have risk factors for colon cancer.  Your  health care provider may also recommend using home test kits to check for hidden blood in the stool.  A small camera at the end of a tube can be used to examine your colon directly (sigmoidoscopy or colonoscopy). This is done to check for the earliest forms of colorectal cancer.  Routine screening usually begins at age 28.  Direct examination of the colon should be repeated every 5-10 years through 78 years of age. However, you may need to be screened more often if early forms of precancerous polyps or small growths are found. Skin Cancer  Check your skin from head to toe regularly.  Tell your health care provider about any new moles or changes in moles, especially if there is a change in a mole's shape or color.  Also tell your health care provider if you have a mole that is larger than the size of a pencil eraser.  Always use sunscreen. Apply sunscreen liberally and repeatedly throughout the day.  Protect yourself by wearing long sleeves, pants, a wide-brimmed hat, and sunglasses  whenever you are outside. HEART DISEASE, DIABETES, AND HIGH BLOOD PRESSURE   Have your blood pressure checked at least every 1-2 years. High blood pressure causes heart disease and increases the risk of stroke.  If you are between 54 years and 35 years old, ask your health care provider if you should take aspirin to prevent strokes.  Have regular diabetes screenings. This involves taking a blood sample to check your fasting blood sugar level.  If you are at a normal weight and have a low risk for diabetes, have this test once every three years after 78 years of age.  If you are overweight and have a high risk for diabetes, consider being tested at a younger age or more often. PREVENTING INFECTION  Hepatitis B  If you have a higher risk for hepatitis B, you should be screened for this virus. You are considered at high risk for hepatitis B if:  You were born in a country where hepatitis B is common. Ask your health care provider which countries are considered high risk.  Your parents were born in a high-risk country, and you have not been immunized against hepatitis B (hepatitis B vaccine).  You have HIV or AIDS.  You use needles to inject street drugs.  You live with someone who has hepatitis B.  You have had sex with someone who has hepatitis B.  You get hemodialysis treatment.  You take certain medicines for conditions, including cancer, organ transplantation, and autoimmune conditions. Hepatitis C  Blood testing is recommended for:  Everyone born from [birth year] through 1963.  Anyone with known risk factors for hepatitis C. Sexually transmitted infections (STIs)  You should be screened for sexually transmitted infections (STIs) including gonorrhea and chlamydia if:  You are sexually active and are younger than 78 years of age.  You are older than 78 years of age and your health care provider tells you that you are at risk for this type of infection.  Your sexual activity  has changed since you were last screened and you are at an increased risk for chlamydia or gonorrhea. Ask your health care provider if you are at risk.  If you do not have HIV, but are at risk, it may be recommended that you take a prescription medicine daily to prevent HIV infection. This is called pre-exposure prophylaxis (PrEP). You are considered at risk if:  You are sexually active and do not regularly use condoms  or know the HIV status of your partner(s).  You take drugs by injection.  You are sexually active with a partner who has HIV. Talk with your health care provider about whether you are at high risk of being infected with HIV. If you choose to begin PrEP, you should first be tested for HIV. You should then be tested every 3 months for as long as you are taking PrEP.  PREGNANCY   If you are premenopausal and you may become pregnant, ask your health care provider about preconception counseling.  If you may become pregnant, take 400 to 800 micrograms (mcg) of folic acid every day.  If you want to prevent pregnancy, talk to your health care provider about birth control (contraception). OSTEOPOROSIS AND MENOPAUSE   Osteoporosis is a disease in which the bones lose minerals and strength with aging. This can result in serious bone fractures. Your risk for osteoporosis can be identified using a bone density scan.  If you are 20 years of age or older, or if you are at risk for osteoporosis and fractures, ask your health care provider if you should be screened.  Ask your health care provider whether you should take a calcium or vitamin D supplement to lower your risk for osteoporosis.  Menopause may have certain physical symptoms and risks.  Hormone replacement therapy may reduce some of these symptoms and risks. Talk to your health care provider about whether hormone replacement therapy is right for you.  HOME CARE INSTRUCTIONS   Schedule regular health, dental, and eye  exams.  Stay current with your immunizations.   Do not use any tobacco products including cigarettes, chewing tobacco, or electronic cigarettes.  If you are pregnant, do not drink alcohol.  If you are breastfeeding, limit how much and how often you drink alcohol.  Limit alcohol intake to no more than 1 drink per day for nonpregnant women. One drink equals 12 ounces of beer, 5 ounces of wine, or 1 ounces of hard liquor.  Do not use street drugs.  Do not share needles.  Ask your health care provider for help if you need support or information about quitting drugs.  Tell your health care provider if you often feel depressed.  Tell your health care provider if you have ever been abused or do not feel safe at home. Document Released: 02/16/2011 Document Revised: 12/18/2013 Document Reviewed: 07/05/2013 [facility] Patient Information 2015 [place], [place]. This information is not intended to replace advice given to you by your health care provider. Make sure you discuss any questions you have with your health care provider.  How to Avoid Diabetes Problems You can do a lot to prevent or slow down diabetes problems. Following your diabetes plan and taking care of yourself can reduce your risk of serious or life-threatening complications. Below, you will find certain things you can do to prevent diabetes problems. MANAGE YOUR DIABETES Follow your health care provider's, nurse educator's, and dietitian's instructions for managing your diabetes. They will teach you the basics of diabetes care. They can help answer questions you may have. Learn about diabetes and make healthy choices regarding eating and physical activity. Monitor your blood glucose level regularly. Your health care provider will help you decide how often to check your blood glucose level depending on your treatment goals and how well you are meeting them.  DO NOT USE NICOTINE Nicotine and diabetes are a dangerous combination.  Nicotine raises your risk for diabetes problems. If you quit using nicotine, you  will lower your risk for heart attack, stroke, nerve disease, and kidney disease. Your cholesterol and your blood pressure levels may improve. Your blood circulation will also improve. Do not use any tobacco products, including cigarettes, chewing tobacco, or electronic cigarettes. If you need help quitting, ask your health care provider. KEEP YOUR BLOOD PRESSURE UNDER CONTROL Keeping your blood pressure under control will help prevent damage to your eyes, kidneys, heart, and blood vessels. Blood pressure consists of two numbers. The top number should be below 120, and the bottom number should be below 80 (120/80). Keep your blood pressure as close to these numbers as you can. If you already have kidney disease, you may want even lower blood pressure to protect your kidneys. Talk to your health care provider to make sure that your blood pressure goal is right for your needs. Meal planning, medicines, and exercise can help you reach your blood pressure target. Have your blood pressure checked at every visit with your health care provider. KEEP YOUR CHOLESTEROL UNDER CONTROL Normal cholesterol levels will help prevent heart disease and stroke. These are the biggest health problems for people with diabetes. Keeping cholesterol levels under control can also help with blood flow. Have your cholesterol level checked at least once a year. Your health care provider may prescribe a medicine known as a statin. Statins lower your cholesterol. If you are not taking a statin, ask your health care provider if you should be. Meal planning, exercise, and medicines can help you reach your cholesterol targets.  SCHEDULE AND KEEP YOUR ANNUAL PHYSICAL EXAMS AND EYE EXAMS Your health care provider will tell you how often he or she wants to see you depending on your plan of treatment. It is important that you keep these appointments so that possible  problems can be identified early and complications can be avoided or treated.  Every visit with your health care provider should include your weight, blood pressure, and an evaluation of your blood glucose control.  Your hemoglobin A1c should be checked:  At least twice a year if you are at your goal.  Every 3 months if there are changes in treatment.  If you are not meeting your goals.  Your blood lipids should be checked yearly. You should also be checked yearly to see if you have protein in your urine (microalbumin).  Schedule a dilated eye exam within 5 years of your diagnosis if you have type 1 diabetes, and then yearly. Schedule a dilated eye exam at diagnosis if you have type 2 diabetes, and then yearly. All exams thereafter can be extended to every 2 to 3 years if one or more exams have been normal. KEEP YOUR VACCINES CURRENT The flu vaccine is recommended yearly. The formula for the vaccine changes every year and needs to be updated for the best protection against current viruses. It is recommended that people with diabetes who are over 72 years old get the pneumonia vaccine. In some cases, two separate shots may be given. Ask your health care provider if your pneumonia vaccination is up-to-date. However, there are some instances where another vaccine is recommended. Check with your health care provider. TAKE CARE OF YOUR FEET  Diabetes may cause you to have a poor blood supply (circulation) to your legs and feet. Because of this, the skin may be thinner, break easier, and heal more slowly. You also may have nerve damage in your legs and feet, causing decreased feeling. You may not notice minor injuries to your feet  that could lead to serious problems or infections. Taking care of your feet is very important. Visual foot exams are performed at every routine medical visit. The exams check for cuts, injuries, or other problems with the feet. A comprehensive foot exam should be done yearly.  This includes visual inspection as well as assessing foot pulses and testing for loss of sensation. You should also do the following:  Inspect your feet daily for cuts, calluses, blisters, ingrown toenails, and signs of infection, such as redness, swelling, or pus.  Wash and dry your feet thoroughly, especially between the toes.  Avoid soaking your feet regularly in hot water baths.  Moisturize dry skin with lotion, avoiding areas between your toes.  Cut toenails straight across and file the edges.  Avoid shoes that do not fit well or have areas that irritate your skin.  Avoid going barefooted or wearing only socks. Your feet need protection. TAKE CARE OF YOUR TEETH People with poorly controlled diabetes are more likely to have gum (periodontal) disease. These infections make diabetes harder to control. Periodontal diseases, if left untreated, can lead to tooth loss. Brush your teeth twice a day, floss, and see your dentist for checkups and cleaning every 6 months, or 2 times a year. ASK YOUR HEALTH CARE PROVIDER ABOUT TAKING ASPIRIN Taking aspirin daily is recommended to help prevent cardiovascular disease in people with and without diabetes. Ask your health care provider if this would benefit you and what dose he or she would recommend. DRINK RESPONSIBLY Moderate amounts of alcohol (less than 1 drink per day for adult women and less than 2 drinks per day for adult men) have a minimal effect on blood glucose if ingested with food. It is important to eat food with alcohol to avoid hypoglycemia. People should avoid alcohol if they have a history of alcohol abuse or dependence, if they are pregnant, and if they have liver disease, pancreatitis, advanced neuropathy, or severe hypertriglyceridemia. LESSEN STRESS Living with diabetes can be stressful. When you are under stress, your blood glucose may be affected in two ways:  Stress hormones may cause your blood glucose to rise.  You may be  distracted from taking good care of yourself. It is a good idea to be aware of your stress level and make changes that are necessary to help you better manage challenging situations. Support groups, planned relaxation, a hobby you enjoy, meditation, healthy relationships, and exercise all work to lower your stress level. If your efforts do not seem to be helping, get help from your health care provider or a trained mental health professional. Document Released: 04/21/2011 Document Revised: 12/18/2013 Document Reviewed: 09/27/2013 [facility] Patient Information 2015 [place], [place]. This information is not intended to replace advice given to you by your health care provider. Make sure you discuss any questions you have with your health care provider.

## 2014-03-28 NOTE — Assessment & Plan Note (Signed)
Long history of medication non adherence Follows with endo for same - little impact on a1c Has declined Lantus, Januvia and does not take OHA as rx'd No metformin due to CKD Long discussion, education provided on long term effects of uncontrolled DM and encouraged compliance with OHA as rx'd Lab Results  Component Value Date   HGBA1C 8.6* 03/02/2014

## 2014-03-28 NOTE — Telephone Encounter (Signed)
Relevant patient education assigned to patient using Emmi. ° °

## 2014-03-28 NOTE — Progress Notes (Signed)
Subjective:    Patient ID: [name], female    DOB: Aug 07, 1928, 80 y.o.   MRN: [id number]  HPI  New pt to me, here to establish care with new PCP since [facility] retiring   Here for medicare wellness  Diet: noncompliant with heart healthy and diabetic Physical activity: sedentary Depression/mood screen: negative Hearing: intact to whispered voice Visual acuity: grossly normal- declines annual eye exam  ADLs: capable Fall risk: none Home safety: good Cognitive evaluation: intact to orientation, naming, recall and repetition EOL planning: adv directives, full code/ I agree  I have personally reviewed and have noted 1. The patient's medical and social history 2. Their use of alcohol, tobacco or illicit drugs 3. Their current medications and supplements 4. The patient's functional ability including ADL's, fall risks, home safety risks and hearing or visual impairment. 5. Diet and physical activities 6. Evidence for depression or mood disorders  Also reviewed chronic medical issues and interval medical events Medical care complicated by long history of medication non adherence   Past Medical History  Diagnosis Date  . Pulmonary embolism 02/2009    on anticoag thru 11/2010 - stopped due to risk>benefit  . Hypertension   . CAD (coronary artery disease)     nonobst on cath 2010  . Cardiomyopathy     hypertensive  . Paroxysmal atrial fibrillation     not anticoag candidate  . Hypercholesterolemia   . Diabetes mellitus     noncompliance with rx'd tx  . Nontoxic multinodular goiter   . Pure hypercholesterolemia   . GERD (gastroesophageal reflux disease)     hx DU with H pylori 01/2009 and GIB, s/p triple rx  . Benign neoplasm of adrenal gland   . Diverticulosis of colon (without mention of hemorrhage)   . Calculus of kidney   . DJD (degenerative joint disease)   . Anxiety   . H. pylori infection    Family History  Problem Relation Age of Onset  . Diabetes  Maternal Grandmother   . Heart disease Maternal Aunt     x2  . Lung cancer Mother   . Brain cancer Mother   . Goiter Mother   . Colon cancer Neg Hx    History  Substance Use Topics  . Smoking status: Former Smoker -- 0.30 packs/day for 36 years    Types: Cigarettes    Quit date: 08/18/1983  . Smokeless tobacco: Never Used  . Alcohol Use: Yes     Comment: occ wine, beer during holidays or at social events    Review of Systems  Constitutional: Negative for fatigue and unexpected weight change.  Respiratory: Negative for cough, shortness of breath and wheezing.   Cardiovascular: Negative for chest pain, palpitations and leg swelling.  Gastrointestinal: Negative for nausea, abdominal pain and diarrhea.  Neurological: Negative for dizziness, weakness, light-headedness and headaches.  Psychiatric/Behavioral: Negative for dysphoric mood. The patient is not nervous/anxious.   All other systems reviewed and are negative.      Objective:   Physical Exam  BP 185/84  Pulse 50  Temp(Src) 97.4 F (36.3 C) (Oral)  Ht 5\' 2"  (1.575 m)  Wt 123 lb 8 oz (56.019 kg)  BMI 22.58 kg/m2  SpO2 99% Wt Readings from Last 3 Encounters:  03/28/14 123 lb 8 oz (56.019 kg)  03/02/14 120 lb (54.432 kg)  12/25/13 121 lb (54.885 kg)   Constitutional: She appears well-developed and well-nourished. No distress.  Neck: Normal range of motion. Neck supple. No JVD  present. No thyromegaly present.  Cardiovascular: Normal rate, regular rhythm and normal heart sounds.  No murmur heard. No BLE edema. Pulmonary/Chest: Effort normal and breath sounds normal. No respiratory distress. She has no wheezes.  Psychiatric: She has a normal mood and affect. Her behavior is normal. Judgment and thought content normal.   Lab Results  Component Value Date   WBC 6.6 10/09/2013   HGB 13.6 10/09/2013   HCT 40.0 10/09/2013   PLT 151 10/09/2013   GLUCOSE 225* 10/09/2013   CHOL 252* 04/26/2013   TRIG 242.0* 04/26/2013   HDL  75.00 04/26/2013   LDLDIRECT 135.7 04/26/2013   LDLCALC  Value: 94        Total Cholesterol/HDL:CHD Risk Coronary Heart Disease Risk Table                     Men   Women  1/2 Average Risk   3.4   3.3  Average Risk       5.0   4.4  2 X Average Risk   9.6   7.1  3 X Average Risk  23.4   11.0        Use the calculated Patient Ratio above and the CHD Risk Table to determine the patient's CHD Risk.        ATP III CLASSIFICATION (LDL):  <100     mg/dL   Optimal  100-129  mg/dL   Near or Above                    Optimal  130-159  mg/dL   Borderline  160-189  mg/dL   High  >190     mg/dL   Very High 11/29/2010   ALT 12 04/26/2013   AST 19 04/26/2013   NA 140 10/09/2013   K 4.1 10/09/2013   CL 105 10/09/2013   CREATININE 1.50* 10/09/2013   BUN 25* 10/09/2013   CO2 23 10/09/2013   TSH 2.67 06/01/2013   INR 1.0 01/07/2011   HGBA1C 8.6* 03/02/2014   MICROALBUR 74.2* 09/01/2013    Ct Abdomen Pelvis W Contrast  10/09/2013   CLINICAL DATA:  80 year old female left flank pain. Left lower quadrant pain. Initial encounter.  EXAM: CT ABDOMEN AND PELVIS WITH CONTRAST  TECHNIQUE: Multidetector CT imaging of the abdomen and pelvis was performed using the standard protocol following bolus administration of intravenous contrast.  CONTRAST:  71mL OMNIPAQUE IOHEXOL 300 MG/ML  SOLN  COMPARISON:  [facility] CT Abdomen and Pelvis 07/28/2005.  FINDINGS: Cardiomegaly which has increased. No pericardial or pleural effusion. Some respiratory motion artifact at the lung bases. Mild atelectasis in the left lower lobe.  Lower lumbar facet degeneration with mild grade 1 spondylolisthesis at L4-L5. No acute osseous abnormality identified.  No pelvic free fluid. Negative uterus and adnexa. Negative rectum. Decompressed bladder. Multiple bilateral pelvic phleboliths.  Redundant sigmoid colon with diverticulosis, and some indistinctness of the sigmoid wall, but no definite mesenteric stranding. Diverticulosis continues into the left colon.  Negative transverse colon. Occasional diverticula in the right colon. The appendix is normal. No dilated small bowel. There is some floculated material in distal small bowel loops. Negative stomach and duodenum.  Negative liver, gallbladder, spleen (subcentimeter hypodensity on series 2, image 23, too small to characterize but probably a small benign cyst), pancreas and adrenal glands (stable thickening). Portal venous system within normal limits.  No hydronephrosis or perinephric stranding. Multiple small probable benign renal cysts. No nephrolithiasis. No hydroureter or periureteral  stranding. Negative course of the left ureter. No abdominal free fluid. No lymphadenopathy.  Major arterial structures in the abdomen and pelvis are patent. There is mild abdominal aortic and proximal iliac artery ectasia in addition to aortoiliac calcified atherosclerosis.  IMPRESSION: 1. No acute or inflammatory process identified in the abdomen or pelvis. 2. Diverticulosis of the descending and sigmoid colon.   Electronically Signed   By: Lars Pinks M.D.   On: 10/09/2013 07:24   Dg Chest Port 1 View  10/09/2013   CLINICAL DATA:  Left flank pain.  EXAM: PORTABLE CHEST - 1 VIEW  COMPARISON:  CT ANGIO CHEST W/CM &/OR WO/CM dated 11/29/2010; DG CHEST 1V PORT dated 11/28/2010  FINDINGS: The cardiac silhouette remains mildly enlarged, mediastinal silhouette is unremarkable. Trace bronchial wall thickening is similar without pleural effusions or focal consolidations. No pneumothorax. Soft tissue planes and included osseous structures are nonsuspicious. Mild degenerative change of the thoracic spine. Suture anchor in the right humeral head.  IMPRESSION: Stable cardiomegaly and mild bronchial wall thickening which may reflect bronchitis without focal consolidation.   Electronically Signed   By: Elon Alas   On: 10/09/2013 03:53       Assessment & Plan:   AWV/v70.0 - Today patient counseled on age appropriate routine health  concerns for screening and prevention, each reviewed and up to date or declined. Immunizations reviewed and up to date or declined. Labs ordered and reviewed. Risk factors for depression reviewed and negative. Hearing function and visual acuity are intact. ADLs screened and addressed as needed. Functional ability and level of safety reviewed and appropriate. Education, counseling and referrals performed based on assessed risks today. Patient provided with a copy of personalized plan for preventive services.  Problem List Items Addressed This Visit   GOITER, MULTINODULAR     Multinod Thyroid> size appears unchanged  TSH 10/13 was 1.53 ultrasound 3/11 showed mult nodules, diffuse inhomogeneous echotexture & dominant left loer pole nodule- Bx= hyperplastic nodule...    Relevant Medications      metoprolol succinate (TOPROL-XL) 24 hr tablet   HYPERCHOLESTEROLEMIA     Intol to statin and refusal to take same documented Prev on Zetia, but no longer compliance with same due to lack of samples encouraged diet control of same nd to follow up with lipid clinic on same Prefers "bitter melon [place] tea", cucumbers, garlic, cinnamon, lemon juice and vinegar to treat her cardiac risk factors    Relevant Medications      metoprolol succinate (TOPROL-XL) 24 hr tablet      ezetimibe (ZETIA) 10 MG tablet      losartan (COZAAR) tablet      amLODIpine (NORVASC) tablet      amLODIpine (NORVASC)  tablet   HYPERTENSION     Reports her home BP "not high", but no home log to review Goals of BP reviewed - treatment complicated by medication non adherence encouraged compliance and education on importance of same provided    Relevant Medications      metoprolol succinate (TOPROL-XL) 24 hr tablet      ezetimibe (ZETIA) 10 MG tablet      losartan (COZAAR) tablet      amLODIpine (NORVASC) tablet      amLODIpine (NORVASC)  tablet   Non-adherence to medical treatment   Type II or unspecified type diabetes mellitus  with renal manifestations, uncontrolled(250.42)      Long history of medication non adherence Follows with endo for same - little impact on a1c Has declined  Lantus, Januvia and does not take OHA as rx'd No metformin due to CKD Long discussion, education provided on long term effects of uncontrolled DM and encouraged compliance with OHA as rx'd Lab Results  Component Value Date   HGBA1C 8.6* 03/02/2014      Relevant Medications      losartan (COZAAR) tablet    Other Visit Diagnoses   Routine general medical examination at a health care facility    -  Primary

## 2014-03-28 NOTE — Assessment & Plan Note (Signed)
Reports her home BP "not high", but no home log to review Goals of BP reviewed - treatment complicated by medication non adherence encouraged compliance and education on importance of same provided

## 2014-04-19 ENCOUNTER — Other Ambulatory Visit (INDEPENDENT_AMBULATORY_CARE_PROVIDER_SITE_OTHER): Payer: Medicare Other

## 2014-04-19 ENCOUNTER — Ambulatory Visit: Payer: Medicare Other | Admitting: Nurse Practitioner

## 2014-04-19 ENCOUNTER — Encounter: Payer: Self-pay | Admitting: Nurse Practitioner

## 2014-04-19 VITALS — BP 140/100 | HR 55 | Temp 98.2°F | Ht 62.0 in | Wt 121.8 lb

## 2014-04-19 DIAGNOSIS — Z0000 Encounter for general adult medical examination without abnormal findings: Secondary | ICD-10-CM

## 2014-04-19 DIAGNOSIS — E1129 Type 2 diabetes mellitus with other diabetic kidney complication: Secondary | ICD-10-CM

## 2014-04-19 DIAGNOSIS — E1165 Type 2 diabetes mellitus with hyperglycemia: Secondary | ICD-10-CM

## 2014-04-19 LAB — LIPID PANEL
CHOL/HDL RATIO: 4
Cholesterol: 302 mg/dL — ABNORMAL HIGH (ref 0–200)
HDL: 81.5 mg/dL (ref >39.00)
LDL Cholesterol: 184 mg/dL — ABNORMAL HIGH (ref 0–99)
NONHDL: 220.5
TRIGLYCERIDES: 181 mg/dL — AB (ref 0.0–149.0)
VLDL: 36.2 mg/dL (ref 0.0–40.0)

## 2014-04-19 NOTE — Patient Instructions (Addendum)
Follow up with Dr. Asa Lente in 8 weeks for BP check Bring in copy of living will for your medical record.  Continue to exercise and maintain low carbohydrate diet. Check lipid level now  Call clinic with questions or concerns   Advance Directive Advance directives are the legal documents that allow you to make choices about your health care and medical treatment if you cannot speak for yourself. Advance directives are a way for you to communicate your wishes to family, friends, and health care providers. The specified people can then convey your decisions about end-of-life care to avoid confusion if you should become unable to communicate. Ideally, the process of discussing and writing advance directives should happen over time rather than making decisions all at once. Advance directives can be modified as your situation changes, and you can change your mind at any time, even after you have signed the advance directives. Each state has its own laws regarding advance directives. You may want to check with your health care provider, attorney, or state representative about the law in your state. Below are some examples of advance directives. LIVING WILL A living will is a set of instructions documenting your wishes about medical care when you cannot care for yourself. It is used if you become:  Terminally ill.  Incapacitated.  Unable to communicate.  Unable to make decisions. Items to consider in your living will include:  The use or non-use of life-sustaining equipment, such as dialysis machines and breathing machines (ventilators).  A do not resuscitate (DNR) order, which is the instruction not to use cardiopulmonary resuscitation (CPR) if breathing or heartbeat stops.  Tube feeding.  Withholding of food and fluids.  Comfort (palliative) care when the goal becomes comfort rather than a cure.  Organ and tissue donation. A living will does not give instructions about distribution of  your money and property if you should pass away. It is advisable to seek the expert advice of a lawyer in drawing up a will regarding your possessions. Decisions about taxes, beneficiaries, and asset distribution will be legally binding. This process can relieve your family and friends of any burdens surrounding disputes or questions that may come up about the allocation of your assets. DO NOT RESUSCITATE (DNR) A do not resuscitate (DNR) order is a request to not have CPR in the event that your heart stops beating or you stop breathing. Unless given other instructions, a health care provider will try to help any patient whose heart has stopped or who has stopped breathing.  HEALTH CARE PROXY AND DURABLE POWER OF ATTORNEY FOR HEALTH CARE A health care proxy is a person (agent) appointed to make medical decisions for you if you cannot. Generally, people choose someone they know well and trust to represent their preferences when they can no longer do so. You should be sure to ask this person for agreement to act as your agent. An agent may have to exercise judgment in the event of a medical decision for which your wishes are not known. The durable power of attorney for health care is the legal document that names your health care proxy. Once written, it should be:  Signed.  Notarized.  Dated.  Copied.  Witnessed.  Incorporated into your medical record. You may also want to appoint someone to manage your financial affairs if you cannot. This is called a durable power of attorney for finances. It is a separate legal document from the durable power of attorney for health care. You may  choose the same person or someone different from your health care proxy to act as your agent in financial matters. Document Released: 11/10/2007 Document Revised: 08/08/2013 Document Reviewed: 12/21/2012 [facility] Patient Information 2015 [place], [place]. This information is not intended to replace advice given to you by  your health care provider. Make sure you discuss any questions you have with your health care provider.

## 2014-04-19 NOTE — Progress Notes (Signed)
Pre visit review using our clinic review tool, if applicable. No additional management support is needed unless otherwise documented below in the visit note. 

## 2014-04-19 NOTE — Progress Notes (Signed)
Subjective:    JANMARIE SMOOT is a 80 y.o. female who presents for Medicare Annual/Subsequent preventive examination.  Preventive Screening-Counseling & Management  Tobacco History  Smoking status  . Former Smoker -- 0.30 packs/day for 36 years  . Types: Cigarettes  . Quit date: 08/18/1983  Smokeless tobacco  . Never Used     Problems Prior to Visit 1.   Current Problems (verified) Patient Active Problem List   Diagnosis Date Noted  . Non-adherence to medical treatment 03/28/2014  . Type II or unspecified type diabetes mellitus with renal manifestations, uncontrolled(250.42) 10/20/2011  . PAROXYSMAL ATRIAL FIBRILLATION 09/30/2009  . RENAL INSUFFICIENCY 08/05/2009  . PULMONARY EMBOLISM 07/03/2009  . GOITER, MULTINODULAR 05/11/2009  . ULCER-DUODENAL 04/12/2009  . PEPTIC ULCER DISEASE, HELICOBACTER PYLORI POSITIVE 03/11/2009  . CORONARY ARTERY DISEASE 11/29/2008  . Hypertensive cardiomyopathy 11/12/2008  . BENIGN NEOPLASM OF ADRENAL GLAND 06/20/2008  . ANXIETY 06/20/2008  . NEUROPATHY 06/20/2008  . DIVERTICULOSIS OF COLON 06/20/2008  . ARTERIOVENOUS MALFORMATION 06/20/2008  . HYPERCHOLESTEROLEMIA 05/10/2008  . HYPERTENSION 05/10/2008  . TRANSIENT ISCHEMIC ATTACK 05/10/2008  . GERD 05/10/2008  . NEPHROLITHIASIS 05/10/2008  . DEGENERATIVE JOINT DISEASE 05/10/2008    Medications Prior to Visit Current Outpatient Prescriptions on File Prior to Visit  Medication Sig Dispense Refill  . amLODipine (NORVASC) 2.5 MG tablet Take 1 tablet (2.5 mg total) by mouth daily. TAKE with 5mg  tab for total 7.5mg  daily  30 tablet  11  . amLODipine (NORVASC) 5 MG tablet Take 1 tablet (5 mg total) by mouth daily. TAKE with 2.5mg  tab for total 7.5mg  daily  30 tablet  11  . cholecalciferol (VITAMIN D) 400 UNITS TABS tablet Take 400 Units by mouth daily.      Marland Kitchen HYDROcodone-acetaminophen (NORCO/VICODIN) 5-325 MG per tablet Take 1-2 tablets by mouth every 4 (four) hours as needed.  15 tablet   0  . losartan (COZAAR) 100 MG tablet Take 1 tablet (100 mg total) by mouth daily.  30 tablet  5  . metoprolol succinate (TOPROL-XL) 50 MG 24 hr tablet Take 1 tablet (50 mg total) by mouth daily. Take with or immediately following a meal.  30 tablet  5  . nateglinide (STARLIX) 120 MG tablet Take 1 tablet (120 mg total) by mouth 3 (three) times daily with meals.  90 tablet  11  . ONETOUCH VERIO test strip TEST 2 TIMES A DAY  100 each  2   Current Facility-Administered Medications on File Prior to Visit  Medication Dose Route Frequency Provider Last Rate Last Dose  . pneumococcal 23 valent vaccine (PNU-IMMUNE) injection 0.5 mL  0.5 mL Intramuscular Once Rowe Clack, MD        Current Medications (verified) Current Outpatient Prescriptions  Medication Sig Dispense Refill  . amLODipine (NORVASC) 2.5 MG tablet Take 1 tablet (2.5 mg total) by mouth daily. TAKE with 5mg  tab for total 7.5mg  daily  30 tablet  11  . amLODipine (NORVASC) 5 MG tablet Take 1 tablet (5 mg total) by mouth daily. TAKE with 2.5mg  tab for total 7.5mg  daily  30 tablet  11  . cholecalciferol (VITAMIN D) 400 UNITS TABS tablet Take 400 Units by mouth daily.      Marland Kitchen HYDROcodone-acetaminophen (NORCO/VICODIN) 5-325 MG per tablet Take 1-2 tablets by mouth every 4 (four) hours as needed.  15 tablet  0  . losartan (COZAAR) 100 MG tablet Take 1 tablet (100 mg total) by mouth daily.  30 tablet  5  . metoprolol succinate (TOPROL-XL)  50 MG 24 hr tablet Take 1 tablet (50 mg total) by mouth daily. Take with or immediately following a meal.  30 tablet  5  . nateglinide (STARLIX) 120 MG tablet Take 1 tablet (120 mg total) by mouth 3 (three) times daily with meals.  90 tablet  11  . ONETOUCH VERIO test strip TEST 2 TIMES A DAY  100 each  2   Current Facility-Administered Medications  Medication Dose Route Frequency Provider Last Rate Last Dose  . pneumococcal 23 valent vaccine (PNU-IMMUNE) injection 0.5 mL  0.5 mL Intramuscular Once  Rowe Clack, MD         Allergies (verified) Lisinopril; Penicillins; and Statins   PAST HISTORY  Family History Family History  Problem Relation Age of Onset  . Diabetes Maternal Grandmother   . Heart disease Maternal Aunt     x2  . Lung cancer Mother   . Brain cancer Mother   . Goiter Mother   . Cancer Mother   . Heart disease Mother   . Diabetes Mother   . Colon cancer Neg Hx     Social History History  Substance Use Topics  . Smoking status: Former Smoker -- 0.30 packs/day for 36 years    Types: Cigarettes    Quit date: 08/18/1983  . Smokeless tobacco: Never Used  . Alcohol Use: 0.6 oz/week    1 Glasses of wine per week     Comment: occ wine, beer during holidays or at social events     Are there smokers in your home (other than you)? No  Risk Factors Current exercise habits: Home exercise routine includes walking 1 hour 3 days weekly hrs per week.  Dietary issues discussed: eats a well balanced diet   Cardiac risk factors: advanced age (older than 41 for men, 54 for women) and diabetes mellitus. Hyperlipidemia, hypertension  Depression Screen (Note: if answer to either of the following is "Yes", a more complete depression screening is indicated)   Over the past two weeks, have you felt down, depressed or hopeless? No  Over the past two weeks, have you felt little interest or pleasure in doing things? No  Have you lost interest or pleasure in daily life? No  Do you often feel hopeless? No  Do you cry easily over simple problems? No  Activities of Daily Living In your present state of health, do you have any difficulty performing the following activities?:  Driving? No Managing money?  No Feeding yourself? No Getting from bed to chair? NoNo exam performed today, not indicated. Climbing a flight of stairs? Yes Preparing food and eating?: No Bathing or showering? No Getting dressed: No Getting to the toilet? No Using the toilet:No Moving around  from place to place: No In the past year have you fallen or had a near fall?:No   Are you sexually active?  Yes  Do you have more than one partner?  No  Hearing Difficulties: No Do you often ask people to speak up or repeat themselves? No Do you experience ringing or noises in your ears? No Do you have difficulty understanding soft or whispered voices? No   Do you feel that you have a problem with memory? No  Do you often misplace items? No  Do you feel safe at home?  Yes  Cognitive Testing  Alert? Yes  Normal Appearance?Yes  Oriented to person? Yes  Place? Yes   Time? Yes  Recall of three objects?  Yes  Can perform simple  calculations? Yes  Displays appropriate judgment?Yes  Can read the correct time from a watch face?Yes   Advanced Directives have been discussed with the patient? Yes  List the Names of Other Physician/Practitioners you currently use: 1. Patient Care Team: Rowe Clack, MD as PCP - General (Internal Medicine) Renato Shin, MD (Endocrinology) Minus Breeding, MD (Cardiology) Ladene Artist, MD (Gastroenterology)    Indicate any recent Medical Services you may have received from other than [facility] providers in the past year (date may be approximate).  Immunization History  Administered Date(s) Administered  . Pneumococcal Polysaccharide-23 03/28/2014    Screening Tests Health Maintenance  Topic Date Due  . Foot Exam  12/05/1939  . Ophthalmology Exam  12/05/1939  . Tetanus/tdap  12/04/1948  . Zostavax  12/04/1989  . Colonoscopy  12/25/2012  . Influenza Vaccine  03/17/2014  . Urine Microalbumin  09/01/2014  . Hemoglobin A1c  09/02/2014  . Pneumococcal Polysaccharide Vaccine Age 19 And Over  Completed    All answers were reviewed with the patient and necessary referrals were made:  Carmelina Paddock, NP   04/19/2014   History reviewed: allergies, current medications, past family history, past medical history, past social history, past surgical  history and problem list  Review of Systems not indicated    Objective:     Vision by Snellen chart: right eye:20/20, left HGD:JMEQAST wears glasses  Body mass index is 22.27 kg/(m^2). BP 140/100  Pulse 55  Temp(Src) 98.2 F (36.8 C) (Oral)  Ht 5\' 2"  (1.575 m)  Wt 121 lb 12.8 oz (55.248 kg)  BMI 22.27 kg/m2  SpO2 99%  No exam performed today, not indicated. Diabeteic foot exam normal  MMSE score 30     Assessment:     Patient presents for yearly preventative medicine examination. Medicare questionnaire was completed  All immunizations and health maintenance protocols were reviewed with the patient and needed orders were placed.  Appropriate screening laboratory values were ordered for the patient including screening of hyperlipidemia, renal function and hepatic function. If indicated by BPH, a PSA was ordered.  Medication reconciliation,  past medical history, social history, problem list and allergies were reviewed in detail with the patient  Goals were established with regard to weight loss, exercise, and  diet in compliance with medications  End of life planning was discussed.       Plan:     During the course of the visit the patient was educated and counseled about appropriate screening and preventive services including:    Influenza vaccine  Td vaccine  Nutrition counseling   Advanced directives: has NO advanced directive  - add't info requested. Referral to SW: yes  Diet review for nutrition referral? Yes ____  Not Indicated __x__   Patient Instructions (the written plan) was given to the patient.   Medicare Attestation I have personally reviewed: The patient's medical and social history Their use of alcohol, tobacco or illicit drugs Their current medications and supplements The patient's functional ability including ADLs,fall risks, home safety risks, cognitive, and hearing and visual impairment Diet and physical activities Evidence for  depression or mood disorders  The patient's weight, height, BMI, and visual acuity have been recorded in the chart.  I have made referrals, counseling, and provided education to the patient based on review of the above and I have provided the patient with a written personalized care plan for preventive services.   Discussed with patient low fat diet. Patient will continue regular exercising with yard  work and walking 3 times weekly  BP recheck 142/94.  Follow up appointment with Dr. Asa Lente in 2 months for BP recheck and evaluation.  Advanced directive information given.  Patient declines influenza  And Zostavax.  Patient will make appointment with Opthalmologist Dr. Marvel Plan for evaluation.    Steva Colder A, NP   04/19/2014

## 2014-06-04 ENCOUNTER — Ambulatory Visit: Payer: Medicare Other | Admitting: Endocrinology

## 2014-06-04 ENCOUNTER — Telehealth: Payer: Self-pay | Admitting: *Deleted

## 2014-06-04 NOTE — Telephone Encounter (Signed)
Printed last lipid panel fax to # below...[name]

## 2014-06-04 NOTE — Telephone Encounter (Signed)
Left msg on triage Friday afternoon @ 4:46 pm stating pt participate in their heart failure & diabetes wellness program. Needing to get a update lab on her lipid & B-met. Pls call or fax to [phone]...Johny Chess

## 2014-06-12 ENCOUNTER — Encounter: Payer: Self-pay | Admitting: Endocrinology

## 2014-06-12 ENCOUNTER — Ambulatory Visit (INDEPENDENT_AMBULATORY_CARE_PROVIDER_SITE_OTHER): Payer: Medicare Other | Admitting: Endocrinology

## 2014-06-12 VITALS — BP 130/82 | HR 68 | Temp 98.0°F | Ht 62.0 in | Wt 125.0 lb

## 2014-06-12 DIAGNOSIS — E1122 Type 2 diabetes mellitus with diabetic chronic kidney disease: Secondary | ICD-10-CM

## 2014-06-12 DIAGNOSIS — N189 Chronic kidney disease, unspecified: Secondary | ICD-10-CM

## 2014-06-12 LAB — HEMOGLOBIN A1C: HEMOGLOBIN A1C: 7.9 % — AB (ref 4.6–6.5)

## 2014-06-12 NOTE — Patient Instructions (Addendum)
Please make a follow-up appointment in 3 months.  check your blood sugar once a day.  vary the time of day when you check, between before the 3 meals, and at bedtime.  also check if you have symptoms of your blood sugar being too high or too low.  please keep a record of the readings and bring it to your next appointment here.  please call us sooner if you are having low blood sugar episodes.   A diabetes blood test is requested for you today.  We'll contact you with results.  If it is high, we can change the nateglinide to "repaglinide."

## 2014-06-12 NOTE — Progress Notes (Signed)
Subjective:    Patient ID: [name], female    DOB: [birth year], 80 y.o.   MRN: [id number]  HPI Pt returns for f/u of diabetes mellitus: DM type: 2 Dx'ed: 8563 Complications: polyneuropathy,  TIA, and CAD Therapy: nateglinide GDM: never DKA: never Severe hypoglycemia: never Pancreatitis: never Other: she took insulin 2014-2015; she declines [place], due to cost Interval history: no cbg record, but states cbg's are in the low to mid-100's.  pt states she feels well in general.   Past Medical History  Diagnosis Date  . Pulmonary embolism 02/2009    on anticoag thru 11/2010 - stopped due to risk>benefit  . Hypertension   . CAD (coronary artery disease)     nonobst on cath 2010  . Cardiomyopathy     hypertensive  . Paroxysmal atrial fibrillation     not anticoag candidate  . Hypercholesterolemia   . Diabetes mellitus     noncompliance with rx'd tx  . Nontoxic multinodular goiter   . Pure hypercholesterolemia   . GERD (gastroesophageal reflux disease)     hx DU with H pylori 01/2009 and GIB, s/p triple rx  . Benign neoplasm of adrenal gland   . Diverticulosis of colon (without mention of hemorrhage)   . Calculus of kidney   . Anxiety   . H. pylori infection   . DJD (degenerative joint disease)     Past Surgical History  Procedure Laterality Date  . Knee surgery      left  . Shoulder surgery      right  . Cataract extraction      History   Social History  . Marital Status: Widowed    Spouse Name: N/A    Number of Children: 1  . Years of Education: N/A   Occupational History  . retired    Social History Main Topics  . Smoking status: Former Smoker -- 0.30 packs/day for 36 years    Types: Cigarettes    Quit date: 08/18/1983  . Smokeless tobacco: Never Used  . Alcohol Use: 0.6 oz/week    1 Glasses of wine per week     Comment: occ wine, beer during holidays or at social events  . Drug Use: No  . Sexual Activity: Not Currently   Other Topics  Concern  . Not on file   Social History Narrative  . No narrative on file    Current Outpatient Prescriptions on File Prior to Visit  Medication Sig Dispense Refill  . amLODipine (NORVASC) 2.5 MG tablet Take 1 tablet (2.5 mg total) by mouth daily. TAKE with 5mg  tab for total 7.5mg  daily  30 tablet  11  . amLODipine (NORVASC) 5 MG tablet Take 1 tablet (5 mg total) by mouth daily. TAKE with 2.5mg  tab for total 7.5mg  daily  30 tablet  11  . cholecalciferol (VITAMIN D) 400 UNITS TABS tablet Take 400 Units by mouth daily.      Marland Kitchen HYDROcodone-acetaminophen (NORCO/VICODIN) 5-325 MG per tablet Take 1-2 tablets by mouth every 4 (four) hours as needed.  15 tablet  0  . losartan (COZAAR) 100 MG tablet Take 1 tablet (100 mg total) by mouth daily.  30 tablet  5  . metoprolol succinate (TOPROL-XL) 50 MG 24 hr tablet Take 1 tablet (50 mg total) by mouth daily. Take with or immediately following a meal.  30 tablet  5  . nateglinide (STARLIX) 120 MG tablet Take 1 tablet (120 mg total) by mouth 3 (three) times daily with  meals.  90 tablet  11  . ONETOUCH VERIO test strip TEST 2 TIMES A DAY  100 each  2   Current Facility-Administered Medications on File Prior to Visit  Medication Dose Route Frequency Provider Last Rate Last Dose  . pneumococcal 23 valent vaccine (PNU-IMMUNE) injection 0.5 mL  0.5 mL Intramuscular Once Rowe Clack, MD        Allergies  Allergen Reactions  . Lisinopril     REACTION: INTOL to ACE's w/ cough  . Penicillins     REACTION: rash  . Statins     Muscle Aches    Family History  Problem Relation Age of Onset  . Diabetes Maternal Grandmother   . Heart disease Maternal Aunt     x2  . Lung cancer Mother   . Brain cancer Mother   . Goiter Mother   . Cancer Mother   . Heart disease Mother   . Diabetes Mother   . Colon cancer Neg Hx     BP 130/82  Pulse 68  Temp(Src) 98 F (36.7 C) (Oral)  Ht 5\' 2"  (1.575 m)  Wt 125 lb (56.7 kg)  BMI 22.86 kg/m2  SpO2  93%    Review of Systems She denies hypoglycemia and weight change    Objective:   Physical Exam VITAL SIGNS:  See vs page GENERAL: no distress Pulses: dorsalis pedis intact bilat.   Feet: no deformity.  no edema Skin:  no ulcer on the feet.  normal color and temp. Neuro: sensation is intact to touch on the feet   Lab Results  Component Value Date   HGBA1C 7.9* 06/12/2014       Assessment & Plan:  DM: given her advanced age, this is well-controlled  Patient is advised the following: Patient Instructions  Please make a follow-up appointment in 3 months.  check your blood sugar once a day.  vary the time of day when you check, between before the 3 meals, and at bedtime.  also check if you have symptoms of your blood sugar being too high or too low.  please keep a record of the readings and bring it to your next appointment here.  please call us sooner if you are having low blood sugar episodes.   A diabetes blood test is requested for you today.  We'll contact you with results.  If it is high, we can change the nateglinide to "repaglinide."

## 2014-07-11 ENCOUNTER — Ambulatory Visit (INDEPENDENT_AMBULATORY_CARE_PROVIDER_SITE_OTHER): Payer: Medicare Other | Admitting: Internal Medicine

## 2014-07-11 ENCOUNTER — Encounter: Payer: Self-pay | Admitting: Internal Medicine

## 2014-07-11 VITALS — BP 132/90 | HR 70 | Temp 97.7°F | Ht 62.0 in | Wt 124.0 lb

## 2014-07-11 DIAGNOSIS — I10 Essential (primary) hypertension: Secondary | ICD-10-CM

## 2014-07-11 DIAGNOSIS — E1122 Type 2 diabetes mellitus with diabetic chronic kidney disease: Secondary | ICD-10-CM

## 2014-07-11 DIAGNOSIS — Z23 Encounter for immunization: Secondary | ICD-10-CM

## 2014-07-11 DIAGNOSIS — N189 Chronic kidney disease, unspecified: Secondary | ICD-10-CM

## 2014-07-11 NOTE — Assessment & Plan Note (Signed)
Long history of medication non adherence Follows with endo for same - little impact on a1c Has declined Lantus, Januvia and does not take regular OHA as rx'd No metformin due to CKD Also rx'd ARB Refer to optho Long discussion, education provided on long term effects of uncontrolled DM and encouraged compliance with OHA as rx'd Lab Results  Component Value Date   HGBA1C 7.9* 06/12/2014

## 2014-07-11 NOTE — Progress Notes (Signed)
Pre visit review using our clinic review tool, if applicable. No additional management support is needed unless otherwise documented below in the visit note. 

## 2014-07-11 NOTE — Progress Notes (Signed)
Subjective:    Patient ID: [name], female    DOB: May 31, 1928, 80 y.o.   MRN: [id number]  HPI  Patient is here for follow up  Reviewed chronic medical issues and interval medical events  Past Medical History  Diagnosis Date  . Pulmonary embolism 02/2009    on anticoag thru 11/2010 - stopped due to risk>benefit  . Hypertension   . CAD (coronary artery disease)     nonobst on cath 2010  . Cardiomyopathy     hypertensive  . Paroxysmal atrial fibrillation     not anticoag candidate  . Hypercholesterolemia   . Diabetes mellitus     hx noncompliance with rx'd tx  . Nontoxic multinodular goiter   . Pure hypercholesterolemia   . GERD (gastroesophageal reflux disease)     hx DU with H pylori 01/2009 and GIB, s/p triple rx  . Benign neoplasm of adrenal gland   . Diverticulosis of colon (without mention of hemorrhage)   . Calculus of kidney   . Anxiety   . H. pylori infection   . DJD (degenerative joint disease)     Review of Systems  Constitutional: Negative for fever and fatigue.  Respiratory: Positive for cough (x 2 weeks, improving but white and thick in AM - taking OTC chest decongestant). Negative for shortness of breath and wheezing.   Cardiovascular: Negative for chest pain and leg swelling.       Objective:   Physical Exam  BP 132/90 mmHg  Pulse 70  Temp(Src) 97.7 F (36.5 C) (Oral)  Ht 5\' 2"  (1.575 m)  Wt 124 lb (56.246 kg)  BMI 22.67 kg/m2  SpO2 97% Wt Readings from Last 3 Encounters:  07/11/14 124 lb (56.246 kg)  06/12/14 125 lb (56.7 kg)  04/19/14 121 lb 12.8 oz (55.248 kg)   Constitutional: She appears well-developed and well-nourished. No distress.  Neck: Normal range of motion. Neck supple. No JVD present. No thyromegaly present.  Cardiovascular: Normal rate, regular rhythm and normal heart sounds.  No murmur heard. No BLE edema. Pulmonary/Chest: Effort normal and breath sounds normal. No respiratory distress. She has no wheezes.    Psychiatric: She has a normal mood and affect. Her behavior is normal. Judgment and thought content normal.   Lab Results  Component Value Date   WBC 6.6 10/09/2013   HGB 13.6 10/09/2013   HCT 40.0 10/09/2013   PLT 151 10/09/2013   GLUCOSE 225* 10/09/2013   CHOL 302* 04/19/2014   TRIG 181.0* 04/19/2014   HDL 81.50 04/19/2014   LDLDIRECT 135.7 04/26/2013   LDLCALC 184* 04/19/2014   ALT 12 04/26/2013   AST 19 04/26/2013   NA 140 10/09/2013   K 4.1 10/09/2013   CL 105 10/09/2013   CREATININE 1.50* 10/09/2013   BUN 25* 10/09/2013   CO2 23 10/09/2013   TSH 2.67 06/01/2013   INR 1.0 01/07/2011   HGBA1C 7.9* 06/12/2014   MICROALBUR 74.2* 09/01/2013    Ct Abdomen Pelvis W Contrast  10/09/2013   CLINICAL DATA:  80 year old female left flank pain. Left lower quadrant pain. Initial encounter.  EXAM: CT ABDOMEN AND PELVIS WITH CONTRAST  TECHNIQUE: Multidetector CT imaging of the abdomen and pelvis was performed using the standard protocol following bolus administration of intravenous contrast.  CONTRAST:  35mL OMNIPAQUE IOHEXOL 300 MG/ML  SOLN  COMPARISON:  [place] CT Abdomen and Pelvis 07/28/2005.  FINDINGS: Cardiomegaly which has increased. No pericardial or pleural effusion. Some respiratory motion artifact at the lung bases.  Mild atelectasis in the left lower lobe.  Lower lumbar facet degeneration with mild grade 1 spondylolisthesis at L4-L5. No acute osseous abnormality identified.  No pelvic free fluid. Negative uterus and adnexa. Negative rectum. Decompressed bladder. Multiple bilateral pelvic phleboliths.  Redundant sigmoid colon with diverticulosis, and some indistinctness of the sigmoid wall, but no definite mesenteric stranding. Diverticulosis continues into the left colon. Negative transverse colon. Occasional diverticula in the right colon. The appendix is normal. No dilated small bowel. There is some floculated material in distal small bowel loops. Negative stomach and  duodenum.  Negative liver, gallbladder, spleen (subcentimeter hypodensity on series 2, image 23, too small to characterize but probably a small benign cyst), pancreas and adrenal glands (stable thickening). Portal venous system within normal limits.  No hydronephrosis or perinephric stranding. Multiple small probable benign renal cysts. No nephrolithiasis. No hydroureter or periureteral stranding. Negative course of the left ureter. No abdominal free fluid. No lymphadenopathy.  Major arterial structures in the abdomen and pelvis are patent. There is mild abdominal aortic and proximal iliac artery ectasia in addition to aortoiliac calcified atherosclerosis.  IMPRESSION: 1. No acute or inflammatory process identified in the abdomen or pelvis. 2. Diverticulosis of the descending and sigmoid colon.   Electronically Signed   By: Lars Pinks M.D.   On: 10/09/2013 07:24   Dg Chest Port 1 View  10/09/2013   CLINICAL DATA:  Left flank pain.  EXAM: PORTABLE CHEST - 1 VIEW  COMPARISON:  CT ANGIO CHEST W/CM &/OR WO/CM dated 11/29/2010; DG CHEST 1V PORT dated 11/28/2010  FINDINGS: The cardiac silhouette remains mildly enlarged, mediastinal silhouette is unremarkable. Trace bronchial wall thickening is similar without pleural effusions or focal consolidations. No pneumothorax. Soft tissue planes and included osseous structures are nonsuspicious. Mild degenerative change of the thoracic spine. Suture anchor in the right humeral head.  IMPRESSION: Stable cardiomegaly and mild bronchial wall thickening which may reflect bronchitis without focal consolidation.   Electronically Signed   By: Elon Alas   On: 10/09/2013 03:53       Assessment & Plan:   Post viral cough. O2 sats normal, afebrile, symptoms improving with over-the-counter chest decongestant -reassurance provided. Patient will call if symptoms worsen or unimproved  Problem List Items Addressed This Visit    Diabetes - Primary    Long history of medication  non adherence Follows with endo for same - little impact on a1c Has declined Lantus, Januvia and does not take regular OHA as rx'd No metformin due to CKD Also rx'd ARB Refer to optho Long discussion, education provided on long term effects of uncontrolled DM and encouraged compliance with OHA as rx'd Lab Results  Component Value Date   HGBA1C 7.9* 06/12/2014      Relevant Orders      Ambulatory referral to Ophthalmology   Essential hypertension    BP Readings from Last 3 Encounters:  07/11/14 132/90  06/12/14 130/82  04/19/14 140/100   Reports her home BP "not high", but no home log to review Goals of BP reviewed - treatment complicated by hx medication non adherence encouraged compliance and education on importance of same provided      Other Visit Diagnoses    Need for prophylactic vaccination against Streptococcus pneumoniae (pneumococcus)        Relevant Orders       Pneumococcal conjugate vaccine 13-valent (Completed)

## 2014-07-11 NOTE — Assessment & Plan Note (Signed)
BP Readings from Last 3 Encounters:  07/11/14 132/90  06/12/14 130/82  04/19/14 140/100   Reports her home BP "not high", but no home log to review Goals of BP reviewed - treatment complicated by hx medication non adherence encouraged compliance and education on importance of same provided

## 2014-07-11 NOTE — Patient Instructions (Signed)
It was good to see you today.  We have reviewed your prior records including labs and tests today  Prevnar, pneumonia vaccine #2, administered today  Medications reviewed and updated, no changes recommended at this time.  we'll make referral to eye doctor. Our office will contact you regarding appointment(s) once made.  Please schedule followup in 6 months, call sooner if problems.

## 2014-07-30 ENCOUNTER — Telehealth: Payer: Self-pay | Admitting: Internal Medicine

## 2014-07-30 NOTE — Telephone Encounter (Signed)
Pt states she is being charged for a wellness exam. She said she had two exams a month apart, she is wondering why nobody picked up on this at this office? Pt states she is allowed 1 per year. She rec'd bill for $172 fo pay for second wellness exam. Pls advise.  [phone]

## 2014-08-08 ENCOUNTER — Telehealth: Payer: Self-pay

## 2014-08-08 NOTE — Telephone Encounter (Signed)
Crystal, RN with [facility], called and requested the BP and the Pulse of the LOV with PCP.

## 2014-09-11 ENCOUNTER — Telehealth: Payer: Self-pay | Admitting: Internal Medicine

## 2014-09-11 NOTE — Telephone Encounter (Signed)
[facility]  Patient Name: [name]  DOB: Aug 21, 1927    Initial Comment Caller states her feet and ankles are beginning to swell. Caller states when she presses on her leg it leaves an indention. Caller states she has been getting short of breath easier than normal. Caller states she does have heart issues.    Nurse Assessment  Nurse: Erlene Quan, RN, Manuela Schwartz Date/Time Eilene Ghazi Time): 09/11/2014 12:42:39 PM  Confirm and document reason for call. If symptomatic, describe symptoms. ---Caller states her feet and ankles are beginning to swell - Caller states when she presses on her leg it leaves an indention. Caller states she has been getting short of breath easier than normal - she is not short of breath now but she burns wood in her heater and if she is out picking up wood she will get short of breath - Caller states she does have heart issues - she has had the swelling in ankles and feet before just seems worse over last few days  Has the patient traveled out of the country within the last 30 days? ---Not Applicable  Does the patient require triage? ---Yes  Related visit to physician within the last 2 weeks? ---No  Does the PT have any chronic conditions? (i.e. diabetes, asthma, etc.) ---Yes     Guidelines    Guideline Title Affirmed Question Affirmed Notes  Leg Swelling and Edema [1] MODERATE leg swelling (e.g., swelling extends up to knees) AND [2] new onset or worsening    Final Disposition User   See Physician within 24 Hours Erlene Quan, RN, Manuela Schwartz    Comments  while looking to schedule patient for appointment tomorrow I found that she already has an appointment with Dr Loanne Drilling tomorrow at 11:00 am - caller states she totally forgot about that appointment , advised her to go ahead and keep that appointment and we will not make her a new one - she states she can do that - states in the past if she has seen a  physician other than her PCP Dr Janene Harvey , and she has any questions or concerns they tell her she will have to talk to her PCP about it - advised pt after seeing Dr Loanne Drilling tomorrow, if she is advised to talk to her PCP about any issues or concerns she is having then go ahead and try to schedule an appointment with her PCP before she leaves - she verbalized understanding

## 2014-09-12 ENCOUNTER — Ambulatory Visit (INDEPENDENT_AMBULATORY_CARE_PROVIDER_SITE_OTHER): Payer: Medicare Other | Admitting: Endocrinology

## 2014-09-12 ENCOUNTER — Encounter: Payer: Self-pay | Admitting: Endocrinology

## 2014-09-12 ENCOUNTER — Telehealth: Payer: Self-pay | Admitting: *Deleted

## 2014-09-12 VITALS — BP 136/84 | HR 84 | Temp 98.1°F | Ht 62.0 in | Wt 127.0 lb

## 2014-09-12 DIAGNOSIS — E1122 Type 2 diabetes mellitus with diabetic chronic kidney disease: Secondary | ICD-10-CM | POA: Diagnosis not present

## 2014-09-12 DIAGNOSIS — N189 Chronic kidney disease, unspecified: Secondary | ICD-10-CM

## 2014-09-12 DIAGNOSIS — I2510 Atherosclerotic heart disease of native coronary artery without angina pectoris: Secondary | ICD-10-CM

## 2014-09-12 LAB — HEMOGLOBIN A1C: Hgb A1c MFr Bld: 9.1 % — ABNORMAL HIGH (ref 4.6–6.5)

## 2014-09-12 MED ORDER — REPAGLINIDE 2 MG PO TABS: 2.0000 mg | ORAL_TABLET | Freq: Three times a day (TID) | ORAL | Status: DC

## 2014-09-12 NOTE — Telephone Encounter (Signed)
[facility] Patient Name: [name] Gender: Female DOB: [birth year] Age: 81 Y 9 M 6 D Return Phone Number: [phone] (Primary) Address: City/State/Zip: [place] [address] [facility] - Client Client Site [place] - Day Physician Gwendolyn Grant Contact Type Call Call Type Triage / Clinical Relationship To Patient Self Appointment Disposition EMR Appointment Not Necessary Return Phone Number [phone] (Primary) Chief Complaint BREATHING - shortness of breath or sounds breathless Initial Comment Caller states her feet and ankles are beginning to swell. Caller states when she presses on her leg it leaves an indention. Caller states she has been getting short of breath easier than normal. Caller states she does have heart issues. PreDisposition Call Doctor Info pasted into Epic Yes Nurse Assessment Nurse: Erlene Quan, RN, Manuela Schwartz Date/Time Eilene Ghazi Time): 09/11/2014 12:42:39 PM Confirm and document reason for call. If symptomatic, describe symptoms. ---Caller states her feet and ankles are beginning to swell - Caller states when she presses on her leg it leaves an indention. Caller states she has been getting short of breath easier than normal - she is not short of breath now but she burns wood in her heater and if she is out picking up wood she will get short of breath - Caller states she does have heart issues - she has had the swelling in ankles and feet before just seems worse over last few days Has the patient traveled out of the country within the last 30 days? ---Not Applicable Does the patient require triage? ---Yes Related visit to physician within the last 2 weeks? ---No Does the PT have any chronic conditions? (i.e. diabetes, asthma, etc.) ---Yes Guidelines Guideline Title Affirmed Question Affirmed Notes Nurse Date/Time  Eilene Ghazi Time) Leg Swelling and Edema [1] MODERATE leg swelling (e.g., swelling extends up to knees) AND [2] new onset or worsening Macario Carls 09/11/2014 12:48:12 PM PLEASE NOTE: All timestamps contained within this report are represented as [place] Standard Time. CONFIDENTIALTY NOTICE: This fax transmission is intended only for the addressee. It contains information that is legally privileged, confidential or otherwise protected from use or disclosure. If you are not the intended recipient, you are strictly prohibited from reviewing, disclosing, copying using or disseminating any of this information or taking any action in reliance on or regarding this information. If you have received this fax in error, please notify us immediately by telephone so that we can arrange for its return to [place]. Phone: [phone], Toll-Free: [phone], Fax: [phone] Page: 2 of 2 Call Id: [id number] [place]. Time Eilene Ghazi Time) Disposition Final User 09/11/2014 12:40:29 PM Send to Urgent Sandi Mealy 09/11/2014 1:01:35 PM See Physician within 24 Hours Yes Erlene Quan, RN, Edwena Bunde Understands: Yes Disagree/Comply: Comply Care Advice Given Per Guideline SEE PHYSICIAN WITHIN 24 HOURS: LEG SWELLING AND/OR EDEMA: * Elevate your legs or try to lay down once or twice daily for 20 minutes. * Avoid socks with an elastic band at the top. Wear comfortable shoes. CALL BACK IF: * Breathing difficulty or chest pain occurs * You become worse. CARE ADVICE given per Leg Swelling and Edema (Adult) guideline. After Care Instructions Given Call Event Type User Date / Time Description Comments User: Clint Guy, RN Date/Time Eilene Ghazi Time): 09/11/2014 1:10:52 PM while looking to schedule patient for appointment tomorrow I found that she already has an appointment with Dr Loanne Drilling tomorrow at 11:00 am - caller states she totally forgot about that  appointment , advised her to go ahead and keep that appointment and  we will not make her a new one - she states she can do that - states in the past if she has seen a physician other than her PCP Dr Janene Harvey , and she has any questions or concerns they tell her she will have to talk to her PCP about it - advised pt after seeing Dr Loanne Drilling tomorrow, if she is advised to talk to her PCP about any issues or concerns she is having then go ahead and try to schedule an appointment with her PCP before she leaves - she verbalized understanding Referrals REFERRED TO PCP OFFICE

## 2014-09-12 NOTE — Patient Instructions (Addendum)
The leg swelling is probably due to the amlodipine, but you should not stop taking it.  Please go back to see Dr Percival Spanish.   you will receive a phone call, about a day and time for an appointment blood tests are being requested for you today.  We'll let you know about the results.  check your blood sugar once a day.  vary the time of day when you check, between before the 3 meals, and at bedtime.  also check if you have symptoms of your blood sugar being too high or too low.  please keep a record of the readings and bring it to your next appointment here.  You can write it on any piece of paper.  please call us sooner if your blood sugar goes below 70, or if you have a lot of readings over 200.  Please come back for a follow-up appointment in 3 months.

## 2014-09-12 NOTE — Progress Notes (Signed)
Subjective:    Patient ID: [name], female    DOB: 28-Jan-1928, 81 y.o.   MRN: [id number]  HPI Pt returns for f/u of diabetes mellitus: DM type: 2 Dx'ed: 9735 Complications: polyneuropathy, renal insufficiency, TIA, and CAD Therapy: nateglinide GDM: never DKA: never Severe hypoglycemia: never Pancreatitis: never Other: she took insulin 2014-2015; she declines [place], due to cost; she cannot take metformin or invokana due to renal insufficiency.  Interval history: no cbg record, but states cbg's are in the low to mid-100's.   Pt states few days of slight swelling of the legs, but no assoc sob.  Past Medical History  Diagnosis Date  . Pulmonary embolism 02/2009    on anticoag thru 11/2010 - stopped due to risk>benefit  . Hypertension   . CAD (coronary artery disease)     nonobst on cath 2010  . Cardiomyopathy     hypertensive  . Paroxysmal atrial fibrillation     not anticoag candidate  . Hypercholesterolemia   . Diabetes mellitus     hx noncompliance with rx'd tx  . Nontoxic multinodular goiter   . Pure hypercholesterolemia   . GERD (gastroesophageal reflux disease)     hx DU with H pylori 01/2009 and GIB, s/p triple rx  . Benign neoplasm of adrenal gland   . Diverticulosis of colon (without mention of hemorrhage)   . Calculus of kidney   . Anxiety   . H. pylori infection   . DJD (degenerative joint disease)     Past Surgical History  Procedure Laterality Date  . Knee surgery      left  . Shoulder surgery      right  . Cataract extraction      History   Social History  . Marital Status: Widowed    Spouse Name: N/A    Number of Children: 1  . Years of Education: N/A   Occupational History  . retired    Social History Main Topics  . Smoking status: Former Smoker -- 0.30 packs/day for 36 years    Types: Cigarettes    Quit date: 08/18/1983  . Smokeless tobacco: Never Used  . Alcohol Use: 0.6 oz/week    1 Glasses of wine per week     Comment:  occ wine, beer during holidays or at social events  . Drug Use: No  . Sexual Activity: Not Currently   Other Topics Concern  . Not on file   Social History Narrative    Current Outpatient Prescriptions on File Prior to Visit  Medication Sig Dispense Refill  . amLODipine (NORVASC) 2.5 MG tablet Take 1 tablet (2.5 mg total) by mouth daily. TAKE with 5mg  tab for total 7.5mg  daily 30 tablet 11  . amLODipine (NORVASC) 5 MG tablet Take 1 tablet (5 mg total) by mouth daily. TAKE with 2.5mg  tab for total 7.5mg  daily 30 tablet 11  . cholecalciferol (VITAMIN D) 400 UNITS TABS tablet Take 400 Units by mouth daily.    Marland Kitchen HYDROcodone-acetaminophen (NORCO/VICODIN) 5-325 MG per tablet Take 1-2 tablets by mouth every 4 (four) hours as needed. 15 tablet 0  . losartan (COZAAR) 100 MG tablet Take 1 tablet (100 mg total) by mouth daily. 30 tablet 5  . metoprolol succinate (TOPROL-XL) 50 MG 24 hr tablet Take 1 tablet (50 mg total) by mouth daily. Take with or immediately following a meal. 30 tablet 5  . ONETOUCH VERIO test strip TEST 2 TIMES A DAY 100 each 2   No current  facility-administered medications on file prior to visit.    Allergies  Allergen Reactions  . Lisinopril     REACTION: INTOL to ACE's w/ cough  . Penicillins     REACTION: rash  . Statins     Muscle Aches    Family History  Problem Relation Age of Onset  . Diabetes Maternal Grandmother   . Heart disease Maternal Aunt     x2  . Lung cancer Mother   . Brain cancer Mother   . Goiter Mother   . Cancer Mother   . Heart disease Mother   . Diabetes Mother   . Colon cancer Neg Hx     BP 136/84 mmHg  Pulse 84  Temp(Src) 98.1 F (36.7 C) (Oral)  Ht 5\' 2"  (1.575 m)  Wt 127 lb (57.607 kg)  BMI 23.22 kg/m2  SpO2 97%  Review of Systems Denies weight change and hypoglycemia.    Objective:   Physical Exam VITAL SIGNS:  See vs page GENERAL: no distress Pulses: dorsalis pedis intact bilat.   MSK: no deformity of the feet CV:  trace leg edema Skin:  no ulcer on the feet.  normal color and temp on the feet. Neuro: sensation is intact to touch on the feet, but decreased from normal.   Lab Results  Component Value Date   HGBA1C 9.1* 09/12/2014       Assessment & Plan:  DM: moderate exacerbation Edema, new, prob due to norvasc.   Patient is advised the following: Patient Instructions  The leg swelling is probably due to the amlodipine, but you should not stop taking it.  Please go back to see Dr Percival Spanish.   you will receive a phone call, about a day and time for an appointment blood tests are being requested for you today.  We'll let you know about the results.  check your blood sugar once a day.  vary the time of day when you check, between before the 3 meals, and at bedtime.  also check if you have symptoms of your blood sugar being too high or too low.  please keep a record of the readings and bring it to your next appointment here.  You can write it on any piece of paper.  please call us sooner if your blood sugar goes below 70, or if you have a lot of readings over 200.  Please come back for a follow-up appointment in 3 months.      change nateglinide to repaglinide

## 2014-09-13 ENCOUNTER — Telehealth: Payer: Self-pay | Admitting: Cardiology

## 2014-09-13 NOTE — Telephone Encounter (Signed)
Spoke with pt, she has swelling in her legs,feet and ankles. It started 3-4 days ago. She c/o SOB if she has to park too far away from the store or going up hill.  No SOB within the home or lying flat. Follow up scheduled

## 2014-09-13 NOTE — Telephone Encounter (Signed)
Wants to know if you have heard from her diabetic doctor. He wants her to see Dr Warren Lacy because her feet and legs are swelling.

## 2014-09-21 ENCOUNTER — Encounter: Payer: Self-pay | Admitting: Cardiology

## 2014-09-21 ENCOUNTER — Ambulatory Visit (INDEPENDENT_AMBULATORY_CARE_PROVIDER_SITE_OTHER): Payer: Medicare Other | Admitting: Cardiology

## 2014-09-21 VITALS — BP 160/90 | HR 73 | Ht 62.0 in | Wt 122.1 lb

## 2014-09-21 DIAGNOSIS — I10 Essential (primary) hypertension: Secondary | ICD-10-CM

## 2014-09-21 MED ORDER — AMLODIPINE BESYLATE 10 MG PO TABS: 10.0000 mg | ORAL_TABLET | Freq: Every day | ORAL | Status: DC

## 2014-09-21 NOTE — Patient Instructions (Signed)
Your physician recommends that you schedule a follow-up appointment in: one year with Dr. Percival Spanish  Start taking amlodipine 10 mg daily  Stop taking the amlodipine  7.5 mg that you are taking now

## 2014-09-21 NOTE — Progress Notes (Signed)
HPI The patient presents for evaluation of chest discomfort and HTN.  At the last visit was having some chest pain.  Her BP has been difficult to control.  She did have her dose of beta blocker increased since I last saw her.  She is active and walks to the store which sounds to be about a half mile away. With this she denies any cardiovascular symptoms. She denies any chest pressure with this activity. She may however occasionally get some chest discomfort.  This happens sporadically. She does not report any significant increase in her symptoms. It is not reproducible with any activity. She has some mild shortness of breath if she walks up several stairs or inclines and this might be slowly progressive but she's not having any resting shortness of breath, PND or orthopnea. She's not having any palpitations, presyncope or syncope. She has no weight gain or edema.  Allergies  Allergen Reactions  . Lisinopril     REACTION: INTOL to ACE's w/ cough  . Penicillins     REACTION: rash  . Statins     Muscle Aches    Current Outpatient Prescriptions  Medication Sig Dispense Refill  . amLODipine (NORVASC) 2.5 MG tablet Take 1 tablet (2.5 mg total) by mouth daily. TAKE with 5mg  tab for total 7.5mg  daily 30 tablet 11  . amLODipine (NORVASC) 5 MG tablet Take 1 tablet (5 mg total) by mouth daily. TAKE with 2.5mg  tab for total 7.5mg  daily 30 tablet 11  . cholecalciferol (VITAMIN D) 400 UNITS TABS tablet Take 400 Units by mouth daily.    Marland Kitchen HYDROcodone-acetaminophen (NORCO/VICODIN) 5-325 MG per tablet Take 1-2 tablets by mouth every 4 (four) hours as needed. 15 tablet 0  . losartan (COZAAR) 100 MG tablet Take 1 tablet (100 mg total) by mouth daily. 30 tablet 5  . metoprolol succinate (TOPROL-XL) 50 MG 24 hr tablet Take 1 tablet (50 mg total) by mouth daily. Take with or immediately following a meal. 30 tablet 5  . ONETOUCH VERIO test strip TEST 2 TIMES A DAY 100 each 2  . repaglinide (PRANDIN) 2 MG tablet  Take 1 tablet (2 mg total) by mouth 3 (three) times daily before meals. 90 tablet 11   No current facility-administered medications for this visit.    Past Medical History  Diagnosis Date  . Pulmonary embolism 02/2009    on anticoag thru 11/2010 - stopped due to risk>benefit  . Hypertension   . CAD (coronary artery disease)     nonobst on cath 2010  . Cardiomyopathy     hypertensive  . Paroxysmal atrial fibrillation     not anticoag candidate  . Hypercholesterolemia   . Diabetes mellitus     hx noncompliance with rx'd tx  . Nontoxic multinodular goiter   . Pure hypercholesterolemia   . GERD (gastroesophageal reflux disease)     hx DU with H pylori 01/2009 and GIB, s/p triple rx  . Benign neoplasm of adrenal gland   . Diverticulosis of colon (without mention of hemorrhage)   . Calculus of kidney   . Anxiety   . H. pylori infection   . DJD (degenerative joint disease)     Past Surgical History  Procedure Laterality Date  . Knee surgery      left  . Shoulder surgery      right  . Cataract extraction      ROS:  As stated in the HPI and negative for all other systems.  PHYSICAL  EXAM BP 160/90 mmHg  Pulse 73  Ht 5\' 2"  (1.575 m)  Wt 122 lb 1.6 oz (55.384 kg)  BMI 22.33 kg/m2 GENERAL:  Well appearing HEENT:  Pupils equal round and reactive, fundi not visualized, oral mucosa unremarkable NECK:  No jugular venous distention, waveform within normal limits, carotid upstroke brisk and symmetric, no bruits, no thyromegaly LYMPHATICS:  No cervical, inguinal adenopathy LUNGS:  Clear to auscultation bilaterally BACK:  No CVA tenderness CHEST:  Unremarkable HEART:  PMI not displaced or sustained, positive right ventricular lift,S1 and S2 within normal limits, no S3, no S4, no clicks, no rubs, no murmurs ABD:  Flat, positive bowel sounds normal in frequency in pitch, no bruits, no rebound, no guarding, no midline pulsatile mass, no hepatomegaly, no splenomegaly EXT:  2 plus pulses  throughout, no edema, no cyanosis no clubbing SKIN:  No rashes no nodules NEURO:  Cranial nerves II through XII grossly intact, motor grossly intact throughout PSYCH:  Cognitively intact, oriented to person place and time  EKG:  Sinus rhythm, rate 73, left bundle branch block, left axis deviation, no change from previous. 09/21/2014  ASSESSMENT AND PLAN   CHEST PAIN The patient did have nonobstructive coronary disease on cath in 2010.  Her last perfusion study I reviewed was in 2013.  I don't think her symptoms have changed.  At this point I don't think further noninvasive or invasive study is indicated.  CARDIOMYOPATHY  Her EF was moderately reduced.  This was unchanged from previous on echo last year. I suspect a hypertensive cardiomyopathy. I will treat this as below.  HYPERTENSION :  I will increase the Norvasc to 10 mg daily. We will continue to titrate her medications.  RENAL INSUFFICIENCY  No further evaluation is warrented at this point.  Her last creatinine was 1.4.  This has been stable

## 2014-12-12 ENCOUNTER — Ambulatory Visit (INDEPENDENT_AMBULATORY_CARE_PROVIDER_SITE_OTHER): Payer: Medicare Other | Admitting: Endocrinology

## 2014-12-12 VITALS — BP 142/98 | HR 83 | Wt 121.0 lb

## 2014-12-12 DIAGNOSIS — N189 Chronic kidney disease, unspecified: Secondary | ICD-10-CM | POA: Diagnosis not present

## 2014-12-12 DIAGNOSIS — E1122 Type 2 diabetes mellitus with diabetic chronic kidney disease: Secondary | ICD-10-CM | POA: Diagnosis not present

## 2014-12-12 LAB — HEMOGLOBIN A1C: Hgb A1c MFr Bld: 8.2 % — ABNORMAL HIGH (ref 4.6–6.5)

## 2014-12-12 MED ORDER — SITAGLIPTIN PHOSPHATE 100 MG PO TABS: ORAL_TABLET | ORAL | Status: DC

## 2014-12-12 NOTE — Patient Instructions (Addendum)
blood tests are being requested for you today.  We'll let you know about the results.  If it is high, we'll add [place].  We can also add bromocriptine if necessary. check your blood sugar once a day.  vary the time of day when you check, between before the 3 meals, and at bedtime.  also check if you have symptoms of your blood sugar being too high or too low.  please keep a record of the readings and bring it to your next appointment here.  You can write it on any piece of paper.  please call us sooner if your blood sugar goes below 70, or if you have a lot of readings over 200.  Please come back for a follow-up appointment in 3 months.

## 2014-12-12 NOTE — Progress Notes (Signed)
Subjective:    Patient ID: [name], female    DOB: 1927-12-19, 81 y.o.   MRN: [id number]  HPI Pt returns for f/u of diabetes mellitus: DM type: 2 Dx'ed: 7829 Complications: polyneuropathy, renal insufficiency, TIA, and CAD Therapy: repaglinide GDM: never DKA: never Severe hypoglycemia: never.   Pancreatitis: never Other: she took insulin 2014-2015; she cannot take metformin or invokana due to renal insufficiency.  Interval history: pt says she does not check cbg's.  pt states she feels well in general.  She seldom misses the prandin. Past Medical History  Diagnosis Date  . Pulmonary embolism 02/2009    on anticoag thru 11/2010 - stopped due to risk>benefit  . Hypertension   . CAD (coronary artery disease)     nonobst on cath 2010  . Cardiomyopathy     hypertensive  . Paroxysmal atrial fibrillation     not anticoag candidate  . Hypercholesterolemia   . Diabetes mellitus     hx noncompliance with rx'd tx  . Nontoxic multinodular goiter   . Pure hypercholesterolemia   . GERD (gastroesophageal reflux disease)     hx DU with H pylori 01/2009 and GIB, s/p triple rx  . Benign neoplasm of adrenal gland   . Diverticulosis of colon (without mention of hemorrhage)   . Calculus of kidney   . Anxiety   . H. pylori infection   . DJD (degenerative joint disease)     Past Surgical History  Procedure Laterality Date  . Knee surgery      left  . Shoulder surgery      right  . Cataract extraction      History   Social History  . Marital Status: Widowed    Spouse Name: N/A  . Number of Children: 1  . Years of Education: N/A   Occupational History  . retired    Social History Main Topics  . Smoking status: Former Smoker -- 0.30 packs/day for 36 years    Types: Cigarettes    Quit date: 08/18/1983  . Smokeless tobacco: Never Used  . Alcohol Use: 0.6 oz/week    1 Glasses of wine per week     Comment: occ wine, beer during holidays or at social events  . Drug  Use: No  . Sexual Activity: Not Currently   Other Topics Concern  . Not on file   Social History Narrative    Current Outpatient Prescriptions on File Prior to Visit  Medication Sig Dispense Refill  . amLODipine (NORVASC) 10 MG tablet Take 10 mg by mouth daily.    . cholecalciferol (VITAMIN D) 400 UNITS TABS tablet Take 400 Units by mouth daily.    Marland Kitchen HYDROcodone-acetaminophen (NORCO/VICODIN) 5-325 MG per tablet Take 1-2 tablets by mouth every 4 (four) hours as needed. 15 tablet 0  . losartan (COZAAR) 100 MG tablet Take 1 tablet (100 mg total) by mouth daily. 30 tablet 5  . metoprolol succinate (TOPROL-XL) 50 MG 24 hr tablet Take 1 tablet (50 mg total) by mouth daily. Take with or immediately following a meal. 30 tablet 5  . ONETOUCH VERIO test strip TEST 2 TIMES A DAY 100 each 2  . repaglinide (PRANDIN) 2 MG tablet Take 1 tablet (2 mg total) by mouth 3 (three) times daily before meals. 90 tablet 11   No current facility-administered medications on file prior to visit.    Allergies  Allergen Reactions  . Lisinopril     REACTION: INTOL to ACE's w/ cough  .  Penicillins     REACTION: rash  . Statins     Muscle Aches    Family History  Problem Relation Age of Onset  . Diabetes Maternal Grandmother   . Heart disease Maternal Aunt     x2  . Lung cancer Mother   . Brain cancer Mother   . Goiter Mother   . Cancer Mother   . Heart disease Mother   . Diabetes Mother   . Colon cancer Neg Hx     BP 142/98 mmHg  Pulse 83  Wt 121 lb (54.885 kg)  SpO2 96%  Review of Systems She denies hypoglycemia.  She reports intermittent edema    Objective:   Physical Exam VITAL SIGNS:  See vs page GENERAL: no distress Pulses: dorsalis pedis intact bilat.   MSK: no deformity of the feet CV: no leg edema Skin:  no ulcer on the feet.  normal color and temp on the feet. Neuro: sensation is intact to touch on the feet   Lab Results  Component Value Date   HGBA1C 8.2* 12/12/2014        Assessment & Plan:  Noncompliance with cbg recording: I'll work around this as best I can, which is to avoid insulin if possible.  Also, she is not a candidate for a1c <7% in this setting (also advanced age) Edema is much better.  However, this still precludes pioglitizone. DM: she needs increased rx.   Patient is advised the following: Patient Instructions  blood tests are being requested for you today.  We'll let you know about the results.  If it is high, we'll add [place].  We can also add bromocriptine if necessary. check your blood sugar once a day.  vary the time of day when you check, between before the 3 meals, and at bedtime.  also check if you have symptoms of your blood sugar being too high or too low.  please keep a record of the readings and bring it to your next appointment here.  You can write it on any piece of paper.  please call us sooner if your blood sugar goes below 70, or if you have a lot of readings over 200.  Please come back for a follow-up appointment in 3 months.     addendum: i have sent a prescription to your pharmacy, to add "[place]."

## 2014-12-13 DIAGNOSIS — H43313 Vitreous membranes and strands, bilateral: Secondary | ICD-10-CM | POA: Diagnosis not present

## 2014-12-13 DIAGNOSIS — E119 Type 2 diabetes mellitus without complications: Secondary | ICD-10-CM | POA: Diagnosis not present

## 2014-12-13 LAB — HM DIABETES EYE EXAM

## 2014-12-14 ENCOUNTER — Encounter: Payer: Self-pay | Admitting: Endocrinology

## 2014-12-14 DIAGNOSIS — H43313 Vitreous membranes and strands, bilateral: Secondary | ICD-10-CM | POA: Diagnosis not present

## 2014-12-17 ENCOUNTER — Other Ambulatory Visit: Payer: Medicare Other

## 2014-12-17 ENCOUNTER — Telehealth: Payer: Self-pay

## 2014-12-17 NOTE — Telephone Encounter (Signed)
Pt is scheduled to have labs done today at 2:15. Patient just had her A1C checked on 12/12/2014. Please advise what labs should be ordered.  thanks!

## 2014-12-17 NOTE — Telephone Encounter (Signed)
I contacted patient advised of note below. Lab appointment cancelled.

## 2014-12-17 NOTE — Telephone Encounter (Signed)
i am unaware of any labs she needs today

## 2014-12-18 ENCOUNTER — Other Ambulatory Visit: Payer: Self-pay

## 2014-12-18 ENCOUNTER — Telehealth: Payer: Self-pay | Admitting: Cardiology

## 2014-12-18 ENCOUNTER — Telehealth: Payer: Self-pay | Admitting: Endocrinology

## 2014-12-18 MED ORDER — PIOGLITAZONE HCL 45 MG PO TABS: 45.0000 mg | ORAL_TABLET | Freq: Every day | ORAL | Status: DC

## 2014-12-18 NOTE — Telephone Encounter (Signed)
Pt called in wanting to speak with Dr. Rosezella [place] nurse about Actos. She says that this is a medication her diabetic doctor is wanting to put her on and she wasn't sure if this would counteract with any of the medications she is currently taking. Please f/u with her  Thanks

## 2014-12-18 NOTE — Telephone Encounter (Signed)
please call patient: Please change januvia to pioglitizone.  i have sent a prescription to your pharmacy Side-effect is swelling of the legs.  Please call if this happens. i'll see you next time.

## 2014-12-18 NOTE — Telephone Encounter (Signed)
See note below and please advise, Thanks! 

## 2014-12-18 NOTE — Telephone Encounter (Signed)
Forward to Dr Percival Spanish AND Lavella Hammock [facility] LPN

## 2014-12-18 NOTE — Telephone Encounter (Signed)
Left voicemail advising of note below. Requested call back if patient would like to discuss.

## 2014-12-18 NOTE — Telephone Encounter (Signed)
[facility] note dated 12/17/14 at 8:21 PM:  Prescription Refill or Medication Request (non symptomatic) Initial Comment Caller states she was prescribed meds that is too much money and needs a different prescription. Caller states she was prescribed a medication that is too expensive and needs a different prescription. Januvia is the Rx and it's $182 and she said that's more than she can afford. Disp. Time Eilene Ghazi Time) Disposition Final User 12/17/2014 8:25:17 PM Clinical Call Yes Timmothy Euler, RN, Joelene Millin After Care Instructions Given Call Event Type User Date / Time Description Comments User: Cristopher Estimable, RN Date/Time Eilene Ghazi Time): 12/17/2014 8:25:06 PM Informed caller to contact her dr. in the morning when they open to request a different medication that will cost less than the Januvia. Caller voiced understanding.

## 2014-12-19 NOTE — Telephone Encounter (Signed)
Left message instructing patient to return call.

## 2014-12-19 NOTE — Telephone Encounter (Signed)
There is not an absolute contraindication to Actos although it can cause fluid retention.  She needs to watch daily weight and let her prescribing MD know if she has increasing SOB.

## 2014-12-20 NOTE — Telephone Encounter (Signed)
Dr Hochrein's recommendations explained to patient. She voiced understanding. She has some reservations w/ starting medication, I advised her that no contraindication from [place] w/e of fluid retention; to bring up her concerns w/ the prescribing physician; she voiced agreement w/ this.

## 2015-01-01 ENCOUNTER — Ambulatory Visit (INDEPENDENT_AMBULATORY_CARE_PROVIDER_SITE_OTHER): Payer: Medicare Other | Admitting: Internal Medicine

## 2015-01-01 ENCOUNTER — Encounter: Payer: Self-pay | Admitting: Internal Medicine

## 2015-01-01 VITALS — BP 132/80 | HR 55 | Temp 97.4°F | Ht 62.0 in | Wt 122.5 lb

## 2015-01-01 DIAGNOSIS — Z23 Encounter for immunization: Secondary | ICD-10-CM

## 2015-01-01 DIAGNOSIS — E1122 Type 2 diabetes mellitus with diabetic chronic kidney disease: Secondary | ICD-10-CM | POA: Diagnosis not present

## 2015-01-01 DIAGNOSIS — Z0000 Encounter for general adult medical examination without abnormal findings: Secondary | ICD-10-CM | POA: Diagnosis not present

## 2015-01-01 DIAGNOSIS — I10 Essential (primary) hypertension: Secondary | ICD-10-CM

## 2015-01-01 DIAGNOSIS — N189 Chronic kidney disease, unspecified: Secondary | ICD-10-CM

## 2015-01-01 MED ORDER — METOPROLOL SUCCINATE ER 50 MG PO TB24: 50.0000 mg | ORAL_TABLET | Freq: Every day | ORAL | Status: DC

## 2015-01-01 MED ORDER — LOSARTAN POTASSIUM 100 MG PO TABS: 100.0000 mg | ORAL_TABLET | Freq: Every day | ORAL | Status: DC

## 2015-01-01 MED ORDER — AMLODIPINE BESYLATE 10 MG PO TABS: 10.0000 mg | ORAL_TABLET | Freq: Every day | ORAL | Status: DC

## 2015-01-01 MED ORDER — ASPIRIN EC 81 MG PO TBEC: 81.0000 mg | DELAYED_RELEASE_TABLET | Freq: Every day | ORAL | Status: DC

## 2015-01-01 NOTE — Patient Instructions (Signed)
It was good to see you today.  We have reviewed your prior records including labs and tests today  Health Maintenance reviewed - tetanus immunization updated today, good for 10 years. All other recommended immunizations and age-appropriate screenings are up-to-date.  Medications reviewed and updated Do not take nateglinide! Start aspirin 81 mg daily - this is a baby aspirin every day No other changes recommended at this time.  Continue working with her diabetes doctor and heart doctor as ongoing  Please schedule followup in 12 months for annual exam and labs, call sooner if problems.

## 2015-01-01 NOTE — Assessment & Plan Note (Signed)
BP Readings from Last 3 Encounters:  01/01/15 132/80  12/12/14 142/98  09/21/14 160/90   Reports her home BP "not high", but no home log to review Goals of BP reviewed - treatment complicated by hx medication non adherence encouraged compliance and education on importance of same provided

## 2015-01-01 NOTE — Progress Notes (Signed)
Subjective:    Patient ID: [name], female    DOB: 08-Aug-1928, 81 y.o.   MRN: [id number]  HPI   Here for medicare wellness  Diet: heart healthy - diabetic Physical activity: sedentary Depression/mood screen: negative Hearing: intact to whispered voice Visual acuity: grossly normal, performs annual eye exam  ADLs: capable Fall risk: none Home safety: good Cognitive evaluation: intact to orientation, naming, recall and repetition EOL planning: adv directives, full code/ I agree  I have personally reviewed and have noted 1. The patient's medical and social history 2. Their use of alcohol, tobacco or illicit drugs 3. Their current medications and supplements 4. The patient's functional ability including ADL's, fall risks, home safety risks and hearing or visual impairment. 5. Diet and physical activities 6. Evidence for depression or mood disorders  Also reviewed chronic conditions, interval events and current concerns  Past Medical History  Diagnosis Date  . Pulmonary embolism 02/2009    on anticoag thru 11/2010 - stopped due to risk>benefit  . Hypertension   . CAD (coronary artery disease)     nonobst on cath 2010  . Cardiomyopathy     hypertensive  . Paroxysmal atrial fibrillation     not anticoag candidate  . Hypercholesterolemia   . Diabetes mellitus     hx noncompliance with rx'd tx  . Nontoxic multinodular goiter   . Pure hypercholesterolemia   . GERD (gastroesophageal reflux disease)     hx DU with H pylori 01/2009 and GIB, s/p triple rx  . Benign neoplasm of adrenal gland   . Diverticulosis of colon (without mention of hemorrhage)   . Calculus of kidney   . Anxiety   . H. pylori infection   . DJD (degenerative joint disease)    Family History  Problem Relation Age of Onset  . Diabetes Maternal Grandmother   . Heart disease Maternal Aunt     x2  . Lung cancer Mother   . Brain cancer Mother   . Goiter Mother   . Cancer Mother   . Heart  disease Mother   . Diabetes Mother   . Colon cancer Neg Hx    History  Substance Use Topics  . Smoking status: Former Smoker -- 0.30 packs/day for 36 years    Types: Cigarettes    Quit date: 08/18/1983  . Smokeless tobacco: Never Used  . Alcohol Use: 0.6 oz/week    1 Glasses of wine per week     Comment: occ wine, beer during holidays or at social events    Review of Systems  Constitutional: Negative for fatigue and unexpected weight change.  Respiratory: Negative for cough, shortness of breath and wheezing.   Cardiovascular: Negative for chest pain, palpitations and leg swelling.  Gastrointestinal: Negative for nausea, abdominal pain and diarrhea.  Neurological: Negative for dizziness, weakness, light-headedness and headaches.  Psychiatric/Behavioral: Negative for dysphoric mood. The patient is not nervous/anxious.   All other systems reviewed and are negative.      Objective:    Physical Exam  Constitutional: She appears well-developed and well-nourished. No distress.  Cardiovascular: Normal rate, regular rhythm and normal heart sounds.   No murmur heard. Pulmonary/Chest: Effort normal and breath sounds normal. No respiratory distress.  Musculoskeletal: She exhibits no edema.    BP 132/80 mmHg  Pulse 55  Temp(Src) 97.4 F (36.3 C) (Oral)  Ht 5\' 2"  (1.575 m)  Wt 122 lb 8 oz (55.566 kg)  BMI 22.40 kg/m2  SpO2 97% Wt Readings from  Last 3 Encounters:  01/01/15 122 lb 8 oz (55.566 kg)  12/12/14 121 lb (54.885 kg)  09/21/14 122 lb 1.6 oz (55.384 kg)     Lab Results  Component Value Date   WBC 6.6 10/09/2013   HGB 13.6 10/09/2013   HCT 40.0 10/09/2013   PLT 151 10/09/2013   GLUCOSE 225* 10/09/2013   CHOL 302* 04/19/2014   TRIG 181.0* 04/19/2014   HDL 81.50 04/19/2014   LDLDIRECT 135.7 04/26/2013   LDLCALC 184* 04/19/2014   ALT 12 04/26/2013   AST 19 04/26/2013   NA 140 10/09/2013   K 4.1 10/09/2013   CL 105 10/09/2013   CREATININE 1.50* 10/09/2013   BUN  25* 10/09/2013   CO2 23 10/09/2013   TSH 2.67 06/01/2013   INR 1.0 01/07/2011   HGBA1C 8.2* 12/12/2014   MICROALBUR 74.2* 09/01/2013    Ct Abdomen Pelvis W Contrast  10/09/2013   CLINICAL DATA:  81 year old female left flank pain. Left lower quadrant pain. Initial encounter.  EXAM: CT ABDOMEN AND PELVIS WITH CONTRAST  TECHNIQUE: Multidetector CT imaging of the abdomen and pelvis was performed using the standard protocol following bolus administration of intravenous contrast.  CONTRAST:  3mL OMNIPAQUE IOHEXOL 300 MG/ML  SOLN  COMPARISON:  [facility] CT Abdomen and Pelvis 07/28/2005.  FINDINGS: Cardiomegaly which has increased. No pericardial or pleural effusion. Some respiratory motion artifact at the lung bases. Mild atelectasis in the left lower lobe.  Lower lumbar facet degeneration with mild grade 1 spondylolisthesis at L4-L5. No acute osseous abnormality identified.  No pelvic free fluid. Negative uterus and adnexa. Negative rectum. Decompressed bladder. Multiple bilateral pelvic phleboliths.  Redundant sigmoid colon with diverticulosis, and some indistinctness of the sigmoid wall, but no definite mesenteric stranding. Diverticulosis continues into the left colon. Negative transverse colon. Occasional diverticula in the right colon. The appendix is normal. No dilated small bowel. There is some floculated material in distal small bowel loops. Negative stomach and duodenum.  Negative liver, gallbladder, spleen (subcentimeter hypodensity on series 2, image 23, too small to characterize but probably a small benign cyst), pancreas and adrenal glands (stable thickening). Portal venous system within normal limits.  No hydronephrosis or perinephric stranding. Multiple small probable benign renal cysts. No nephrolithiasis. No hydroureter or periureteral stranding. Negative course of the left ureter. No abdominal free fluid. No lymphadenopathy.  Major arterial structures in the abdomen and pelvis are  patent. There is mild abdominal aortic and proximal iliac artery ectasia in addition to aortoiliac calcified atherosclerosis.  IMPRESSION: 1. No acute or inflammatory process identified in the abdomen or pelvis. 2. Diverticulosis of the descending and sigmoid colon.   Electronically Signed   By: Lars Pinks M.D.   On: 10/09/2013 07:24   Dg Chest Port 1 View  10/09/2013   CLINICAL DATA:  Left flank pain.  EXAM: PORTABLE CHEST - 1 VIEW  COMPARISON:  CT ANGIO CHEST W/CM &/OR WO/CM dated 11/29/2010; DG CHEST 1V PORT dated 11/28/2010  FINDINGS: The cardiac silhouette remains mildly enlarged, mediastinal silhouette is unremarkable. Trace bronchial wall thickening is similar without pleural effusions or focal consolidations. No pneumothorax. Soft tissue planes and included osseous structures are nonsuspicious. Mild degenerative change of the thoracic spine. Suture anchor in the right humeral head.  IMPRESSION: Stable cardiomegaly and mild bronchial wall thickening which may reflect bronchitis without focal consolidation.   Electronically Signed   By: Elon Alas   On: 10/09/2013 03:53       Assessment & Plan:  AWV/z00.00 - Today patient counseled on age appropriate routine health concerns for screening and prevention, each reviewed and up to date or declined. Immunizations reviewed and up to date or declined. Labs ordered and reviewed. Risk factors for depression reviewed and negative. Hearing function and visual acuity are intact. ADLs screened and addressed as needed. Functional ability and level of safety reviewed and appropriate. Education, counseling and referrals performed based on assessed risks today. Patient provided with a copy of personalized plan for preventive services.  Problem List Items Addressed This Visit    Diabetes    Long history of medication non adherence Follows with endo for same - little impact on a1c Has declined Lantus, Januvia and does not take regular OHA as rx'd No  metformin due to CKD Also rx'd ARB Long discussion, education provided on long term effects of uncontrolled DM and encouraged compliance with OHA as rx'd Lab Results  Component Value Date   HGBA1C 8.2* 12/12/2014        Relevant Medications   aspirin EC 81 MG tablet   Essential hypertension    BP Readings from Last 3 Encounters:  01/01/15 132/80  12/12/14 142/98  09/21/14 160/90   Reports her home BP "not high", but no home log to review Goals of BP reviewed - treatment complicated by hx medication non adherence encouraged compliance and education on importance of same provided      Relevant Medications   aspirin EC 81 MG tablet    Other Visit Diagnoses    Routine general medical examination at a health care facility    -  Primary        Gwendolyn Grant, MD

## 2015-01-01 NOTE — Assessment & Plan Note (Signed)
Long history of medication non adherence Follows with endo for same - little impact on a1c Has declined Lantus, Januvia and does not take regular OHA as rx'd No metformin due to CKD Also rx'd ARB Long discussion, education provided on long term effects of uncontrolled DM and encouraged compliance with OHA as rx'd Lab Results  Component Value Date   HGBA1C 8.2* 12/12/2014

## 2015-01-01 NOTE — Progress Notes (Signed)
Pre visit review using our clinic review tool, if applicable. No additional management support is needed unless otherwise documented below in the visit note. 

## 2015-01-09 ENCOUNTER — Ambulatory Visit: Payer: Medicare Other | Admitting: Internal Medicine

## 2015-01-30 ENCOUNTER — Telehealth: Payer: Self-pay | Admitting: Internal Medicine

## 2015-01-30 NOTE — Telephone Encounter (Signed)
disregard

## 2015-03-22 ENCOUNTER — Encounter (HOSPITAL_COMMUNITY): Payer: Self-pay | Admitting: Emergency Medicine

## 2015-03-22 ENCOUNTER — Telehealth: Payer: Self-pay | Admitting: Internal Medicine

## 2015-03-22 ENCOUNTER — Emergency Department (HOSPITAL_COMMUNITY): Payer: Medicare Other

## 2015-03-22 ENCOUNTER — Observation Stay (HOSPITAL_COMMUNITY)
Admission: EM | Admit: 2015-03-22 | Discharge: 2015-03-24 | Disposition: A | Discharge status: Home / Self Care | Payer: Medicare Other | Attending: Internal Medicine | Admitting: Internal Medicine

## 2015-03-22 DIAGNOSIS — I2699 Other pulmonary embolism without acute cor pulmonale: Secondary | ICD-10-CM

## 2015-03-22 DIAGNOSIS — F419 Anxiety disorder, unspecified: Secondary | ICD-10-CM | POA: Insufficient documentation

## 2015-03-22 DIAGNOSIS — I480 Paroxysmal atrial fibrillation: Secondary | ICD-10-CM | POA: Insufficient documentation

## 2015-03-22 DIAGNOSIS — I10 Essential (primary) hypertension: Secondary | ICD-10-CM | POA: Insufficient documentation

## 2015-03-22 DIAGNOSIS — N200 Calculus of kidney: Secondary | ICD-10-CM | POA: Insufficient documentation

## 2015-03-22 DIAGNOSIS — E780 Pure hypercholesterolemia: Secondary | ICD-10-CM | POA: Insufficient documentation

## 2015-03-22 DIAGNOSIS — I429 Cardiomyopathy, unspecified: Secondary | ICD-10-CM | POA: Diagnosis not present

## 2015-03-22 DIAGNOSIS — E1122 Type 2 diabetes mellitus with diabetic chronic kidney disease: Secondary | ICD-10-CM

## 2015-03-22 DIAGNOSIS — Z87891 Personal history of nicotine dependence: Secondary | ICD-10-CM | POA: Diagnosis not present

## 2015-03-22 DIAGNOSIS — R079 Chest pain, unspecified: Secondary | ICD-10-CM | POA: Diagnosis not present

## 2015-03-22 DIAGNOSIS — K219 Gastro-esophageal reflux disease without esophagitis: Secondary | ICD-10-CM | POA: Insufficient documentation

## 2015-03-22 DIAGNOSIS — E119 Type 2 diabetes mellitus without complications: Secondary | ICD-10-CM | POA: Insufficient documentation

## 2015-03-22 DIAGNOSIS — E042 Nontoxic multinodular goiter: Secondary | ICD-10-CM | POA: Diagnosis not present

## 2015-03-22 DIAGNOSIS — Z8619 Personal history of other infectious and parasitic diseases: Secondary | ICD-10-CM | POA: Diagnosis not present

## 2015-03-22 DIAGNOSIS — N189 Chronic kidney disease, unspecified: Secondary | ICD-10-CM

## 2015-03-22 DIAGNOSIS — Z7982 Long term (current) use of aspirin: Secondary | ICD-10-CM | POA: Insufficient documentation

## 2015-03-22 DIAGNOSIS — K5730 Diverticulosis of large intestine without perforation or abscess without bleeding: Secondary | ICD-10-CM | POA: Insufficient documentation

## 2015-03-22 DIAGNOSIS — R0789 Other chest pain: Secondary | ICD-10-CM | POA: Diagnosis not present

## 2015-03-22 DIAGNOSIS — M549 Dorsalgia, unspecified: Secondary | ICD-10-CM | POA: Insufficient documentation

## 2015-03-22 DIAGNOSIS — Z880 Allergy status to penicillin: Secondary | ICD-10-CM | POA: Diagnosis not present

## 2015-03-22 DIAGNOSIS — Z79899 Other long term (current) drug therapy: Secondary | ICD-10-CM | POA: Insufficient documentation

## 2015-03-22 DIAGNOSIS — Z86018 Personal history of other benign neoplasm: Secondary | ICD-10-CM | POA: Insufficient documentation

## 2015-03-22 DIAGNOSIS — I517 Cardiomegaly: Secondary | ICD-10-CM | POA: Diagnosis not present

## 2015-03-22 DIAGNOSIS — I2510 Atherosclerotic heart disease of native coronary artery without angina pectoris: Secondary | ICD-10-CM | POA: Insufficient documentation

## 2015-03-22 DIAGNOSIS — Z86711 Personal history of pulmonary embolism: Secondary | ICD-10-CM | POA: Insufficient documentation

## 2015-03-22 DIAGNOSIS — M79606 Pain in leg, unspecified: Secondary | ICD-10-CM | POA: Insufficient documentation

## 2015-03-22 DIAGNOSIS — R0602 Shortness of breath: Secondary | ICD-10-CM | POA: Insufficient documentation

## 2015-03-22 HISTORY — DX: Gastrointestinal hemorrhage, unspecified: K92.2

## 2015-03-22 HISTORY — DX: Personal history of other diseases of the circulatory system: Z86.79

## 2015-03-22 LAB — CBC WITH DIFFERENTIAL/PLATELET
Basophils Absolute: 0 10*3/uL (ref 0.0–0.1)
Basophils Relative: 0 % (ref 0–1)
EOS ABS: 0.1 10*3/uL (ref 0.0–0.7)
Eosinophils Relative: 2 % (ref 0–5)
HEMATOCRIT: 34.6 % — AB (ref 36.0–46.0)
HEMOGLOBIN: 11.6 g/dL — AB (ref 12.0–15.0)
Lymphocytes Relative: 27 % (ref 12–46)
Lymphs Abs: 1.6 10*3/uL (ref 0.7–4.0)
MCH: 28.9 pg (ref 26.0–34.0)
MCHC: 33.5 g/dL (ref 30.0–36.0)
MCV: 86.3 fL (ref 78.0–100.0)
Monocytes Absolute: 0.4 10*3/uL (ref 0.1–1.0)
Monocytes Relative: 6 % (ref 3–12)
NEUTROS PCT: 65 % (ref 43–77)
Neutro Abs: 3.9 10*3/uL (ref 1.7–7.7)
Platelets: 126 10*3/uL — ABNORMAL LOW (ref 150–400)
RBC: 4.01 MIL/uL (ref 3.87–5.11)
RDW: 13.8 % (ref 11.5–15.5)
WBC: 6 10*3/uL (ref 4.0–10.5)

## 2015-03-22 LAB — I-STAT BETA HCG BLOOD, ED (MC, WL, AP ONLY)

## 2015-03-22 LAB — I-STAT TROPONIN, ED: TROPONIN I, POC: 0.01 ng/mL (ref 0.00–0.08)

## 2015-03-22 LAB — BASIC METABOLIC PANEL
ANION GAP: 10 (ref 5–15)
BUN: 42 mg/dL — AB (ref 6–20)
CO2: 21 mmol/L — ABNORMAL LOW (ref 22–32)
Calcium: 9.4 mg/dL (ref 8.9–10.3)
Chloride: 109 mmol/L (ref 101–111)
Creatinine, Ser: 2.08 mg/dL — ABNORMAL HIGH (ref 0.44–1.00)
GFR calc Af Amer: 24 mL/min — ABNORMAL LOW (ref >60)
GFR, EST NON AFRICAN AMERICAN: 21 mL/min — AB (ref >60)
Glucose, Bld: 137 mg/dL — ABNORMAL HIGH (ref 65–99)
Potassium: 4.3 mmol/L (ref 3.5–5.1)
Sodium: 140 mmol/L (ref 135–145)

## 2015-03-22 MED ORDER — ASPIRIN EC 81 MG PO TBEC
81.0000 mg | DELAYED_RELEASE_TABLET | Freq: Every day | ORAL | Status: DC
  Administered 2015-03-23 (×2): 81 mg via ORAL
  Filled 2015-03-22 (×2): qty 1

## 2015-03-22 MED ORDER — INSULIN ASPART 100 UNIT/ML ~~LOC~~ SOLN: 0.0000 [IU] | Freq: Every day | SUBCUTANEOUS | Status: DC

## 2015-03-22 MED ORDER — HEPARIN SODIUM (PORCINE) 5000 UNIT/ML IJ SOLN: 5000.0000 [IU] | Freq: Three times a day (TID) | INTRAMUSCULAR | Status: DC

## 2015-03-22 MED ORDER — METOPROLOL SUCCINATE ER 50 MG PO TB24
50.0000 mg | ORAL_TABLET | Freq: Every day | ORAL | Status: DC
  Filled 2015-03-22: qty 1

## 2015-03-22 MED ORDER — ONDANSETRON HCL 4 MG/2ML IJ SOLN: 4.0000 mg | Freq: Four times a day (QID) | INTRAMUSCULAR | Status: DC | PRN

## 2015-03-22 MED ORDER — INSULIN ASPART 100 UNIT/ML ~~LOC~~ SOLN
0.0000 [IU] | Freq: Three times a day (TID) | SUBCUTANEOUS | Status: DC
Start: 2015-03-23 — End: 2015-03-24
  Administered 2015-03-23: 5 [IU] via SUBCUTANEOUS
  Administered 2015-03-24: 3 [IU] via SUBCUTANEOUS

## 2015-03-22 MED ORDER — METHOCARBAMOL 500 MG PO TABS: 500.0000 mg | ORAL_TABLET | Freq: Three times a day (TID) | ORAL | Status: DC | PRN

## 2015-03-22 MED ORDER — ACETAMINOPHEN 325 MG PO TABS: 650.0000 mg | ORAL_TABLET | ORAL | Status: DC | PRN

## 2015-03-22 MED ORDER — MORPHINE SULFATE 2 MG/ML IJ SOLN: 2.0000 mg | INTRAMUSCULAR | Status: DC | PRN

## 2015-03-22 MED ORDER — AMLODIPINE BESYLATE 10 MG PO TABS
10.0000 mg | ORAL_TABLET | Freq: Every day | ORAL | Status: DC
  Administered 2015-03-23 (×2): 10 mg via ORAL
  Filled 2015-03-22 (×2): qty 1

## 2015-03-22 NOTE — ED Notes (Signed)
Patient states she has also has had some groin pain starting today.

## 2015-03-22 NOTE — ED Provider Notes (Signed)
CSN: [id number]     Arrival date & time 03/22/15  1740 History   First MD Initiated Contact with Patient 03/22/15 2101     Chief Complaint  Patient presents with  . Chest Pain     (Consider location/radiation/quality/duration/timing/severity/associated sxs/prior Treatment) HPI Comments: Patient presents with intermittent left-sided chest pain that comes and goes throughout the day today. She feels this is related to her CHF. Pain lasted a few minutes. She has shortness of breath is worse with exertion. Patient with remote history of pulmonary embolism no longer on anticoagulation. She reports no history of heart attack or stent in the heart. She states compliance with her medications. She has a history of hypertension, high cholesterol, diabetes, nonobstructive coronary disease on cath in 2010. She denies any leg pain or leg swelling. She arrives here he is complaining of pain and swelling and cramping to her bilateral thighs.  The history is provided by the patient.    Past Medical History  Diagnosis Date  . Pulmonary embolism 02/2009    on anticoag thru 11/2010 - stopped due to risk>benefit  . Hypertension   . CAD (coronary artery disease)     nonobst on cath 2010  . Cardiomyopathy     hypertensive  . Paroxysmal atrial fibrillation     not anticoag candidate  . Hypercholesterolemia   . Diabetes mellitus     hx noncompliance with rx'd tx  . Nontoxic multinodular goiter   . Pure hypercholesterolemia   . GERD (gastroesophageal reflux disease)     hx DU with H pylori 01/2009 and GIB, s/p triple rx  . Benign neoplasm of adrenal gland   . Diverticulosis of colon (without mention of hemorrhage)   . Calculus of kidney   . Anxiety   . H. pylori infection   . DJD (degenerative joint disease)    Past Surgical History  Procedure Laterality Date  . Knee surgery      left  . Shoulder surgery      right  . Cataract extraction     Family History  Problem Relation Age of Onset  .  Diabetes Maternal Grandmother   . Heart disease Maternal Aunt     x2  . Lung cancer Mother   . Brain cancer Mother   . Goiter Mother   . Cancer Mother   . Heart disease Mother   . Diabetes Mother   . Colon cancer Neg Hx    History  Substance Use Topics  . Smoking status: Former Smoker -- 0.30 packs/day for 36 years    Types: Cigarettes    Quit date: 08/18/1983  . Smokeless tobacco: Never Used  . Alcohol Use: 0.6 oz/week    1 Glasses of wine per week     Comment: occ wine, beer during holidays or at social events   OB History    No data available     Review of Systems  Constitutional: Negative for fever, activity change and appetite change.  HENT: Negative for congestion and rhinorrhea.   Respiratory: Positive for chest tightness and shortness of breath. Negative for cough.   Cardiovascular: Positive for chest pain.  Gastrointestinal: Negative for nausea, vomiting and abdominal pain.  Genitourinary: Negative for dysuria, hematuria, vaginal bleeding and vaginal discharge.  Musculoskeletal: Negative for myalgias and arthralgias.  Skin: Negative for rash.  Neurological: Negative for dizziness, weakness, light-headedness and headaches.  A complete 10 system review of systems was obtained and all systems are negative except as noted in the  HPI and PMH.      Allergies  Lisinopril; Penicillins; and Statins  Home Medications   Prior to Admission medications   Medication Sig Start Date End Date Taking? Authorizing Provider  amLODipine (NORVASC) 10 MG tablet Take 1 tablet (10 mg total) by mouth daily. 01/01/15   Rowe Clack, MD  aspirin EC 81 MG tablet Take 1 tablet (81 mg total) by mouth daily. 01/01/15   Rowe Clack, MD  cholecalciferol (VITAMIN D) 400 UNITS TABS tablet Take 400 Units by mouth daily.    Historical Provider, MD  HYDROcodone-acetaminophen (NORCO/VICODIN) 5-325 MG per tablet Take 1-2 tablets by mouth every 4 (four) hours as needed. 10/09/13   Elyn Peers, MD  losartan (COZAAR) 100 MG tablet Take 1 tablet (100 mg total) by mouth daily. 01/01/15   Rowe Clack, MD  metoprolol succinate (TOPROL-XL) 50 MG 24 hr tablet Take 1 tablet (50 mg total) by mouth daily. Take with or immediately following a meal. 01/01/15   Rowe Clack, MD  [facility] VERIO test strip TEST 2 TIMES A DAY    Renato Shin, MD  pioglitazone (ACTOS) 45 MG tablet Take 1 tablet (45 mg total) by mouth daily. 12/18/14   Renato Shin, MD  repaglinide (PRANDIN) 2 MG tablet Take 1 tablet (2 mg total) by mouth 3 (three) times daily before meals. 09/12/14   Renato Shin, MD   BP 163/90 mmHg  Pulse 62  Temp(Src) 97.7 F (36.5 C) (Oral)  Resp 20  SpO2 100% Physical Exam  Constitutional: She is oriented to person, place, and time. She appears well-developed and well-nourished. No distress.  HENT:  Head: Normocephalic and atraumatic.  Mouth/Throat: Oropharynx is clear and moist. No oropharyngeal exudate.  Eyes: Conjunctivae and EOM are normal. Pupils are equal, round, and reactive to light.  Neck: Normal range of motion. Neck supple.  No meningismus.  Cardiovascular: Normal rate, regular rhythm, normal heart sounds and intact distal pulses.   No murmur heard. Pulmonary/Chest: Effort normal and breath sounds normal. No respiratory distress. She has no wheezes. She exhibits no tenderness.  Abdominal: Soft. There is no tenderness. There is no rebound and no guarding.  Musculoskeletal: Normal range of motion. She exhibits no edema or tenderness.  Bilateral thigh tenderness. No skin changes, compartments soft.  Neurological: She is alert and oriented to person, place, and time. No cranial nerve deficit. She exhibits normal muscle tone. Coordination normal.  No ataxia on finger to nose bilaterally. No pronator drift. 5/5 strength throughout. CN 2-12 intact. Negative Romberg. Equal grip strength. Sensation intact. Gait is normal.   Skin: Skin is warm.  Psychiatric: She has a  normal mood and affect. Her behavior is normal.  Nursing note and vitals reviewed.   ED Course  Procedures (including critical care time) Labs Review Labs Reviewed  BASIC METABOLIC PANEL - Abnormal; Notable for the following:    CO2 21 (*)    Glucose, Bld 137 (*)    BUN 42 (*)    Creatinine, Ser 2.08 (*)    GFR calc non Af Amer 21 (*)    GFR calc Af Amer 24 (*)    All other components within normal limits  CBC WITH DIFFERENTIAL/PLATELET - Abnormal; Notable for the following:    Hemoglobin 11.6 (*)    HCT 34.6 (*)    Platelets 126 (*)    All other components within normal limits  I-STAT TROPOININ, ED  I-STAT BETA HCG BLOOD, ED ([facility], [facility], [facility] ONLY)  Imaging Review Dg Chest 2 View  03/22/2015   CLINICAL DATA:  Left-sided chest discomfort since yesterday. History of pulmonary embolism, hypertension and diabetes. Initial encounter.  EXAM: CHEST  2 VIEW  COMPARISON:  Radiographs 10/09/2013.  CT 11/29/2010.  FINDINGS: There is stable cardiac enlargement. The mediastinal contours are unchanged. The lungs are clear. There is no pleural effusion or pneumothorax. Postsurgical changes are present at the right shoulder. No acute osseous findings seen.  IMPRESSION: Stable cardiomegaly.  No acute cardiopulmonary process.   Electronically Signed   By: Richardean Sale M.D.   On: 03/22/2015 18:33     EKG Interpretation   Date/Time:  Friday March 22 2015 17:47:25 EDT Ventricular Rate:  75 PR Interval:  156 QRS Duration: 152 QT Interval:  434 QTC Calculation: 484 R Axis:   -70 Text Interpretation:  Normal sinus rhythm Left axis deviation Left bundle  branch block Abnormal ECG No significant change was found Confirmed by  Hudson [phone]) on 03/22/2015 9:01:50 PM      MDM   Final diagnoses:  None   Intermittent left-sided chest pain coming and going lasting several minutes at a time. Several week history of exertional shortness of breath. Remote history of blood clot no longer  on anticoagulation. Patient with no hypoxia. EKG is unchanged from previous left bundle branch block.  Last cath 2010.   Chest pain free on arrival. Patient endorses several day history of exertional SOBs.  Also c/o "cramping" in her thighs. Labs with worsening CKD.  Initial troponin negative. Hx PE in past, but no hypoxia or tachycardia.  Doubt PE.  Concern for anginal equivalent given SOB on exertion. CP free at this time. Admission d/w Dr. Posey Pronto. VQ ordered.    Ezequiel Essex, MD 03/23/15 1011

## 2015-03-22 NOTE — H&P (Addendum)
[facility] History and Physical  Patient: [name]  MRN: [id number]  DOB: [birth year]  DOS: the patient was seen and examined on 03/22/2015 PCP: Gwendolyn Grant, MD  Referring physician: Dr. Kingsley Callander Chief Complaint: Chest pain  HPI: [name] is a 81 y.o. female with Past medical history of history of pulmonary embolism not on any anticoagulation, coronary artery disease nonobstructive, atrial fibrillation on any anticoagulation, diabetes mellitus, neuropathy, GERD, hypertension. The patient is presenting with complaints of chest pain the pain is starting tonight and was located on the left side of the chest. It is intermittent and comes and goes and feels like sharp stabbing pain. She also has progressively worsening shortness of breath since last one week which is worsening with exertion. She denies any fever or chills orthopnea or PND is denies any nausea or vomiting diarrhea or constipation. She mentions she is compliant with all her medications. She denies any fever or chills burning urination no focal deficit. At the time of my evaluation she remains chest pain-free  The patient is coming from home.  At her baseline ambulates without any support And is independent for most of her ADL manages her medication on her own.  Review of Systems: as mentioned in the history of present illness.  A comprehensive review of the other systems is negative.  Past Medical History  Diagnosis Date  . Pulmonary embolism 02/2009    on anticoag thru 11/2010 - stopped due to risk>benefit  . Hypertension   . CAD (coronary artery disease)     nonobst on cath 2010  . Cardiomyopathy     hypertensive  . Paroxysmal atrial fibrillation     not anticoag candidate  . Hypercholesterolemia   . Diabetes mellitus     hx noncompliance with rx'd tx  . Nontoxic multinodular goiter   . Pure hypercholesterolemia   . GERD (gastroesophageal reflux disease)     hx DU with H pylori 01/2009  and GIB, s/p triple rx  . Benign neoplasm of adrenal gland   . Diverticulosis of colon (without mention of hemorrhage)   . Calculus of kidney   . Anxiety   . H. pylori infection   . DJD (degenerative joint disease)    Past Surgical History  Procedure Laterality Date  . Knee surgery      left  . Shoulder surgery      right  . Cataract extraction     Social History:  reports that she quit smoking about 31 years ago. Her smoking use included Cigarettes. She has a 10.8 pack-year smoking history. She has never used smokeless tobacco. She reports that she drinks about 0.6 oz of alcohol per week. She reports that she does not use illicit drugs.  Allergies  Allergen Reactions  . Lisinopril     REACTION: INTOL to ACE's w/ cough  . Penicillins     REACTION: rash  . Statins     Muscle Aches    Family History  Problem Relation Age of Onset  . Diabetes Maternal Grandmother   . Heart disease Maternal Aunt     x2  . Lung cancer Mother   . Brain cancer Mother   . Goiter Mother   . Cancer Mother   . Heart disease Mother   . Diabetes Mother   . Colon cancer Neg Hx     Prior to Admission medications   Medication Sig Start Date End Date Taking? Authorizing Provider  acetaminophen (TYLENOL) 650 MG CR tablet Take  1,300 mg by mouth every 8 (eight) hours as needed for pain.   Yes Historical Provider, MD  amLODipine (NORVASC) 10 MG tablet Take 1 tablet (10 mg total) by mouth daily. Patient taking differently: Take 10 mg by mouth at bedtime.  01/01/15  Yes Rowe Clack, MD  aspirin EC 81 MG tablet Take 1 tablet (81 mg total) by mouth daily. Patient taking differently: Take 81 mg by mouth at bedtime.  01/01/15  Yes Rowe Clack, MD  losartan (COZAAR) 100 MG tablet Take 1 tablet (100 mg total) by mouth daily. Patient taking differently: Take 100 mg by mouth at bedtime.  01/01/15  Yes Rowe Clack, MD  metoprolol succinate (TOPROL-XL) 50 MG 24 hr tablet Take 1 tablet (50 mg  total) by mouth daily. Take with or immediately following a meal. 01/01/15  Yes Rowe Clack, MD  pioglitazone (ACTOS) 45 MG tablet Take 1 tablet (45 mg total) by mouth daily. Patient taking differently: Take 45 mg by mouth at bedtime.  12/18/14  Yes Renato Shin, MD  repaglinide (PRANDIN) 2 MG tablet Take 1 tablet (2 mg total) by mouth 3 (three) times daily before meals. 09/12/14  Yes Renato Shin, MD  HYDROcodone-acetaminophen (NORCO/VICODIN) 5-325 MG per tablet Take 1-2 tablets by mouth every 4 (four) hours as needed. Patient not taking: Reported on 03/22/2015 10/09/13   Elyn Peers, MD    Physical Exam: Filed Vitals:   03/22/15 2200 03/22/15 2215 03/22/15 2230 03/22/15 2255  BP: 171/90 164/80 161/81 177/90  Pulse: 75 48 49 76  Temp:    98.1 F (36.7 C)  TempSrc:    Oral  Resp: 14 17 17 18   Height:    5\' 3"  (1.6 m)  Weight:    57.788 kg (127 lb 6.4 oz)  SpO2: 100% 96% 100% 96%    General: Alert, Awake and Oriented to Time, Place and Person. Appear in mild distress Eyes: PERRL ENT: Oral Mucosa clear moist. Neck: no JVD Cardiovascular: S1 and S2 Present, no Murmur, Peripheral Pulses Present Respiratory: Bilateral Air entry equal and Decreased,  Clear to Auscultation, no Crackles, no wheezes Abdomen: Bowel Sound present, Soft and non tender Skin: no Rash Extremities: no Pedal edema, no calf tenderness Neurologic: Grossly no focal neuro deficit.  Labs on Admission:  CBC:  Recent Labs Lab 03/22/15 1755  WBC 6.0  NEUTROABS 3.9  HGB 11.6*  HCT 34.6*  MCV 86.3  PLT 126*    CMP     Component Value Date/Time   NA 140 03/22/2015 1755   K 4.3 03/22/2015 1755   CL 109 03/22/2015 1755   CO2 21* 03/22/2015 1755   GLUCOSE 137* 03/22/2015 1755   BUN 42* 03/22/2015 1755   CREATININE 2.08* 03/22/2015 1755   CALCIUM 9.4 03/22/2015 1755   PROT 7.2 04/26/2013 1008   ALBUMIN 3.8 04/26/2013 1008   AST 19 04/26/2013 1008   ALT 12 04/26/2013 1008   ALKPHOS 48 04/26/2013 1008    BILITOT 0.6 04/26/2013 1008   GFRNONAA 21* 03/22/2015 1755   GFRAA 24* 03/22/2015 1755    No results for input(s): LIPASE, AMYLASE in the last 168 hours.  No results for input(s): CKTOTAL, CKMB, CKMBINDEX, TROPONINI in the last 168 hours. BNP (last 3 results) No results for input(s): BNP in the last 8760 hours.  ProBNP (last 3 results) No results for input(s): PROBNP in the last 8760 hours.   Radiological Exams on Admission: Dg Chest 2 View  03/22/2015   CLINICAL  DATA:  Left-sided chest discomfort since yesterday. History of pulmonary embolism, hypertension and diabetes. Initial encounter.  EXAM: CHEST  2 VIEW  COMPARISON:  Radiographs 10/09/2013.  CT 11/29/2010.  FINDINGS: There is stable cardiac enlargement. The mediastinal contours are unchanged. The lungs are clear. There is no pleural effusion or pneumothorax. Postsurgical changes are present at the right shoulder. No acute osseous findings seen.  IMPRESSION: Stable cardiomegaly.  No acute cardiopulmonary process.   Electronically Signed   By: Richardean Sale M.D.   On: 03/22/2015 18:33   EKG: Independently reviewed. normal sinus rhythm, nonspecific ST and T waves changes. Old LBBB  Assessment/Plan Principal Problem:   Chest pain Active Problems:   Essential hypertension   GERD   Diabetes   1. Chest pain Patient is presenting with complaint of chest pain, initial EKG and troponins are unremarkable. Patient will be admitted in telemetry unit. She remains nothing by mouth. We'll continue her on aspirin and beta blocker. Echocardiogram in the morning. Serial troponin.  2. Essential hypertension. Continuing home medications.  3. Diabetes mellitus type 2. Placing her on sliding scale. Holding oral hypoglycemic agents.  4. Back pain. Would check lumbar spine x-ray.  5. History of DVT. History of PE. Would check VQ scan in the morning.  6. Acute on chronic kidney disease. We'll continue to closely monitor most likely  this is progression of her chronic kidney disease secondary to diabetes. Avoiding nephrotoxic medications.  Advance goals of care discussion: Full code as per my discussion with patient. Patient's daughter will be her power of attorney   DVT Prophylaxis: subcutaneous Heparin Nutrition: Nothing by mouth  Disposition: Admitted as observation, telemetry unit.  Author: Berle Mull, MD [facility] Pager: [phone] 03/22/2015  If 7PM-7AM, please contact night-coverage [email or web address] Password [email or web address]

## 2015-03-22 NOTE — Telephone Encounter (Signed)
[facility] Patient Name: [name] DOB: 20-Mar-1928 Initial Comment Caller states her legs are in pain. Nurse Assessment Nurse: Martyn Ehrich, RN, Felicia Date/Time (Eastern Time): 03/22/2015 4:15:46 PM Confirm and document reason for call. If symptomatic, describe symptoms. ---Pt is having terrible pains in her upper thighs onset for a few months. It is off and on. No fever Has the patient traveled out of the country within the last 30 days? ---No Does the patient require triage? ---Yes Related visit to physician within the last 2 weeks? ---No Does the PT have any chronic conditions? (i.e. diabetes, asthma, etc.) ---Yes List chronic conditions. ---DM HTN high cholesterol CHF Guidelines Guideline Title Affirmed Question Affirmed NotesLeg Pain History of prior "blood clot" in leg or lungs (i.e., deep vein thrombosis, pulmonary embolism) Final Disposition User See Physician within 4 Hours (or PCP triage) Martyn Ehrich, RN, Felicia Comments upgraded to ER in case it is related to circulation Told her get someone to drive her - she said she may have to wait until neighbors come home from work to take her Referrals [facility] - ED Disagree/Comply: Comply Call Id: [phone]

## 2015-03-22 NOTE — ED Notes (Signed)
Pt c/o CP today after having leg pain; pt sts hx of blood clot in past

## 2015-03-22 NOTE — ED Notes (Signed)
Attempted report 

## 2015-03-23 ENCOUNTER — Observation Stay (HOSPITAL_COMMUNITY): Payer: Medicare Other

## 2015-03-23 ENCOUNTER — Encounter (HOSPITAL_COMMUNITY): Payer: Self-pay | Admitting: Cardiology

## 2015-03-23 DIAGNOSIS — I10 Essential (primary) hypertension: Secondary | ICD-10-CM | POA: Diagnosis not present

## 2015-03-23 DIAGNOSIS — R0789 Other chest pain: Secondary | ICD-10-CM | POA: Diagnosis not present

## 2015-03-23 DIAGNOSIS — R7989 Other specified abnormal findings of blood chemistry: Secondary | ICD-10-CM

## 2015-03-23 DIAGNOSIS — M5136 Other intervertebral disc degeneration, lumbar region: Secondary | ICD-10-CM | POA: Diagnosis not present

## 2015-03-23 DIAGNOSIS — R079 Chest pain, unspecified: Secondary | ICD-10-CM | POA: Diagnosis not present

## 2015-03-23 DIAGNOSIS — N184 Chronic kidney disease, stage 4 (severe): Secondary | ICD-10-CM

## 2015-03-23 DIAGNOSIS — E119 Type 2 diabetes mellitus without complications: Secondary | ICD-10-CM | POA: Diagnosis not present

## 2015-03-23 DIAGNOSIS — R002 Palpitations: Secondary | ICD-10-CM

## 2015-03-23 DIAGNOSIS — I209 Angina pectoris, unspecified: Secondary | ICD-10-CM | POA: Diagnosis not present

## 2015-03-23 DIAGNOSIS — R0602 Shortness of breath: Secondary | ICD-10-CM | POA: Diagnosis not present

## 2015-03-23 LAB — CBC WITH DIFFERENTIAL/PLATELET
Basophils Absolute: 0 10*3/uL (ref 0.0–0.1)
Basophils Relative: 0 % (ref 0–1)
Eosinophils Absolute: 0.1 10*3/uL (ref 0.0–0.7)
Eosinophils Relative: 2 % (ref 0–5)
HCT: 35.1 % — ABNORMAL LOW (ref 36.0–46.0)
Hemoglobin: 11.3 g/dL — ABNORMAL LOW (ref 12.0–15.0)
LYMPHS ABS: 2.3 10*3/uL (ref 0.7–4.0)
LYMPHS PCT: 43 % (ref 12–46)
MCH: 27.6 pg (ref 26.0–34.0)
MCHC: 32.2 g/dL (ref 30.0–36.0)
MCV: 85.6 fL (ref 78.0–100.0)
MONO ABS: 0.3 10*3/uL (ref 0.1–1.0)
Monocytes Relative: 6 % (ref 3–12)
NEUTROS PCT: 49 % (ref 43–77)
Neutro Abs: 2.6 10*3/uL (ref 1.7–7.7)
Platelets: 137 10*3/uL — ABNORMAL LOW (ref 150–400)
RBC: 4.1 MIL/uL (ref 3.87–5.11)
RDW: 13.9 % (ref 11.5–15.5)
WBC: 5.3 10*3/uL (ref 4.0–10.5)

## 2015-03-23 LAB — TROPONIN I
Troponin I: 0.11 ng/mL — ABNORMAL HIGH (ref <0.031)
Troponin I: 0.12 ng/mL — ABNORMAL HIGH (ref <0.031)
Troponin I: 0.1 ng/mL — ABNORMAL HIGH (ref <0.031)

## 2015-03-23 LAB — COMPREHENSIVE METABOLIC PANEL
ALK PHOS: 47 U/L (ref 38–126)
ALT: 25 U/L (ref 14–54)
ANION GAP: 8 (ref 5–15)
AST: 36 U/L (ref 15–41)
Albumin: 3.6 g/dL (ref 3.5–5.0)
BUN: 33 mg/dL — ABNORMAL HIGH (ref 6–20)
CO2: 26 mmol/L (ref 22–32)
Calcium: 9.1 mg/dL (ref 8.9–10.3)
Chloride: 106 mmol/L (ref 101–111)
Creatinine, Ser: 2.03 mg/dL — ABNORMAL HIGH (ref 0.44–1.00)
GFR calc Af Amer: 25 mL/min — ABNORMAL LOW (ref >60)
GFR calc non Af Amer: 21 mL/min — ABNORMAL LOW (ref >60)
GLUCOSE: 141 mg/dL — AB (ref 65–99)
Potassium: 3.9 mmol/L (ref 3.5–5.1)
Sodium: 140 mmol/L (ref 135–145)
Total Bilirubin: 0.6 mg/dL (ref 0.3–1.2)
Total Protein: 7.3 g/dL (ref 6.5–8.1)

## 2015-03-23 LAB — GLUCOSE, CAPILLARY
GLUCOSE-CAPILLARY: 110 mg/dL — AB (ref 65–99)
GLUCOSE-CAPILLARY: 111 mg/dL — AB (ref 65–99)
GLUCOSE-CAPILLARY: 105 mg/dL — AB (ref 65–99)
GLUCOSE-CAPILLARY: 156 mg/dL — AB (ref 65–99)
Glucose-Capillary: 204 mg/dL — ABNORMAL HIGH (ref 65–99)

## 2015-03-23 MED ORDER — TECHNETIUM TO 99M ALBUMIN AGGREGATED
5.9600 | Freq: Once | INTRAVENOUS | Status: AC | PRN
  Administered 2015-03-23: 5.96 via INTRAVENOUS

## 2015-03-23 MED ORDER — HEPARIN SODIUM (PORCINE) 5000 UNIT/ML IJ SOLN
5000.0000 [IU] | Freq: Three times a day (TID) | INTRAMUSCULAR | Status: DC
  Filled 2015-03-23: qty 1

## 2015-03-23 MED ORDER — HYDRALAZINE HCL 25 MG PO TABS
25.0000 mg | ORAL_TABLET | Freq: Three times a day (TID) | ORAL | Status: DC
  Administered 2015-03-23 – 2015-03-24 (×3): 25 mg via ORAL
  Filled 2015-03-23 (×4): qty 1

## 2015-03-23 MED ORDER — METOPROLOL SUCCINATE ER 50 MG PO TB24
50.0000 mg | ORAL_TABLET | Freq: Every day | ORAL | Status: DC
  Administered 2015-03-23: 50 mg via ORAL
  Filled 2015-03-23: qty 1

## 2015-03-23 MED ORDER — HYDRALAZINE HCL 20 MG/ML IJ SOLN: 10.0000 mg | Freq: Four times a day (QID) | INTRAMUSCULAR | Status: DC | PRN

## 2015-03-23 MED ORDER — TECHNETIUM TC 99M DIETHYLENETRIAME-PENTAACETIC ACID: 38.3000 | Freq: Once | INTRAVENOUS | Status: AC | PRN

## 2015-03-23 MED ORDER — HEPARIN (PORCINE) IN NACL 100-0.45 UNIT/ML-% IJ SOLN: 700.0000 [IU]/h | INTRAMUSCULAR | Status: DC

## 2015-03-23 MED ORDER — HEPARIN SODIUM (PORCINE) 5000 UNIT/ML IJ SOLN
5000.0000 [IU] | Freq: Three times a day (TID) | INTRAMUSCULAR | Status: DC
  Administered 2015-03-23 – 2015-03-24 (×3): 5000 [IU] via SUBCUTANEOUS
  Filled 2015-03-23 (×2): qty 1

## 2015-03-23 MED ORDER — ROSUVASTATIN CALCIUM 10 MG PO TABS
5.0000 mg | ORAL_TABLET | Freq: Every day | ORAL | Status: DC
  Filled 2015-03-23: qty 1

## 2015-03-23 NOTE — Progress Notes (Signed)
  Echocardiogram 2D Echocardiogram has been performed.  Lysle Rubens 03/23/2015, 1:48 PM

## 2015-03-23 NOTE — Consult Note (Signed)
Reason for Consult: chest pain and elevated troponin   Referring Physician:  Dr. Posey Pronto  PCP:  Gwendolyn Grant, MD  Primary Cardiologist: Dr. Ramonita Lab [name] is an 81 y.o. female.    Chief Complaint:  Chest pain with admit on 03/22/15.  HPI: 67 YOF with hx of HTN, nonobstructive cath 2010 ( scattered disease of LAD with max stenosis in range of 50% after the diagonal takeoff), and last Nuc study 2013 was negative for ischemia.  She has moderately reduced EF, Dr. Percival Spanish suspected hypertensive cardiomyopathy with last echo 12/2013 with EF 30-35% and G2DD,  the findings indicate significant global left ventricular wall dyssynchrony.  She also has hx of stable chest pain.  Hx of PAF but not an anticoag. candidate.  Now presented to hospital yesterday and admitted with chest pain.  Pain began last pm, felt like aching pain lt ant chest that would wax and wane like the ocean.  Also complained of SOB especially with exertion.  But her greatest pain was bilateral cramping in her thighs.  Her chest pain and SOB are more chronic but seem somewhat more bothersome.  She has had to decrease activities due to both the pain and SOB.  Hills bring it out the most and she must stop and rest for 10-15 min before resolved.  None with walking in her home and none during the night.   Now with troponin elevation 0.11 to 0.12.  EKG with her chronic LBBB and deeper T wave inversions in lateral leads.  Past Medical History  Diagnosis Date  . Pulmonary embolism 02/2009    on anticoag thru 11/2010 - stopped due to risk>benefit  . Hypertension   . CAD (coronary artery disease)     nonobst on cath 2010  . Cardiomyopathy     hypertensive  . Paroxysmal atrial fibrillation     not anticoag candidate  . Hypercholesterolemia   . Diabetes mellitus     hx noncompliance with rx'd tx  . Nontoxic multinodular goiter   . Pure hypercholesterolemia   . GERD (gastroesophageal reflux disease)     hx  DU with H pylori 01/2009 and GIB, s/p triple rx  . Benign neoplasm of adrenal gland   . Diverticulosis of colon (without mention of hemorrhage)   . Calculus of kidney   . Anxiety   . H. pylori infection   . DJD (degenerative joint disease)   . Hx of subarachnoid hemorrhage 2001  . GI bleed 2012    duoedonal ulcer    Past Surgical History  Procedure Laterality Date  . Knee surgery      left  . Shoulder surgery      right  . Cataract extraction      Family History  Problem Relation Age of Onset  . Diabetes Maternal Grandmother   . Heart disease Maternal Aunt     x2  . Lung cancer Mother   . Brain cancer Mother   . Goiter Mother   . Cancer Mother   . Heart disease Mother   . Diabetes Mother   . Colon cancer Neg Hx    Social History:  reports that she quit smoking about 31 years ago. Her smoking use included Cigarettes. She has a 10.8 pack-year smoking history. She has never used smokeless tobacco. She reports that she drinks about 0.6 oz of alcohol per week. She reports that she does not use illicit drugs.  Allergies:  Allergies  Allergen Reactions  . Lisinopril     REACTION: INTOL to ACE's w/ cough  . Penicillins     REACTION: rash  . Statins     Muscle Aches    OUTPATIENT MEDICATIONS: No current facility-administered medications on file prior to encounter.   Current Outpatient Prescriptions on File Prior to Encounter  Medication Sig Dispense Refill  . amLODipine (NORVASC) 10 MG tablet Take 1 tablet (10 mg total) by mouth daily. (Patient taking differently: Take 10 mg by mouth at bedtime. ) 90 tablet 3  . aspirin EC 81 MG tablet Take 1 tablet (81 mg total) by mouth daily. (Patient taking differently: Take 81 mg by mouth at bedtime. ) 150 tablet 2  . losartan (COZAAR) 100 MG tablet Take 1 tablet (100 mg total) by mouth daily. (Patient taking differently: Take 100 mg by mouth at bedtime. ) 90 tablet 3  . metoprolol succinate (TOPROL-XL) 50 MG 24 hr tablet Take 1  tablet (50 mg total) by mouth daily. Take with or immediately following a meal. 90 tablet 3  . pioglitazone (ACTOS) 45 MG tablet Take 1 tablet (45 mg total) by mouth daily. (Patient taking differently: Take 45 mg by mouth at bedtime. ) 30 tablet 11  . repaglinide (PRANDIN) 2 MG tablet Take 1 tablet (2 mg total) by mouth 3 (three) times daily before meals. 90 tablet 11  . HYDROcodone-acetaminophen (NORCO/VICODIN) 5-325 MG per tablet Take 1-2 tablets by mouth every 4 (four) hours as needed. (Patient not taking: Reported on 03/22/2015) 15 tablet 0   CURRENT MEDICATIONS: Scheduled Meds: . amLODipine  10 mg Oral QHS  . aspirin EC  81 mg Oral QHS  . heparin  5,000 Units Subcutaneous 3 times per day  . insulin aspart  0-15 Units Subcutaneous TID WC  . insulin aspart  0-5 Units Subcutaneous QHS  . metoprolol succinate  50 mg Oral QHS   Continuous Infusions:  PRN Meds:.acetaminophen, methocarbamol, morphine injection, ondansetron (ZOFRAN) IV, technetium TC 58M diethylenetriame-pentaacetic acid   Results for orders placed or performed during the hospital encounter of 03/22/15 (from the past 48 hour(s))  Basic metabolic panel     Status: Abnormal   Collection Time: 03/22/15  5:55 PM  Result Value Ref Range   Sodium 140 135 - 145 mmol/L   Potassium 4.3 3.5 - 5.1 mmol/L   Chloride 109 101 - 111 mmol/L   CO2 21 (L) 22 - 32 mmol/L   Glucose, Bld 137 (H) 65 - 99 mg/dL   BUN 42 (H) 6 - 20 mg/dL   Creatinine, Ser 2.08 (H) 0.44 - 1.00 mg/dL   Calcium 9.4 8.9 - 10.3 mg/dL   GFR calc non Af Amer 21 (L) >60 mL/min   GFR calc Af Amer 24 (L) >60 mL/min    Comment: (NOTE) The eGFR has been calculated using the CKD EPI equation. This calculation has not been validated in all clinical situations. eGFR's persistently <60 mL/min signify possible Chronic Kidney Disease.    Anion gap 10 5 - 15  CBC with Differential     Status: Abnormal   Collection Time: 03/22/15  5:55 PM  Result Value Ref Range   WBC 6.0  4.0 - 10.5 K/uL   RBC 4.01 3.87 - 5.11 MIL/uL   Hemoglobin 11.6 (L) 12.0 - 15.0 g/dL   HCT 34.6 (L) 36.0 - 46.0 %   MCV 86.3 78.0 - 100.0 fL   MCH 28.9 26.0 - 34.0 pg   MCHC 33.5 30.0 - 36.0  g/dL   RDW 13.8 11.5 - 15.5 %   Platelets 126 (L) 150 - 400 K/uL   Neutrophils Relative % 65 43 - 77 %   Neutro Abs 3.9 1.7 - 7.7 K/uL   Lymphocytes Relative 27 12 - 46 %   Lymphs Abs 1.6 0.7 - 4.0 K/uL   Monocytes Relative 6 3 - 12 %   Monocytes Absolute 0.4 0.1 - 1.0 K/uL   Eosinophils Relative 2 0 - 5 %   Eosinophils Absolute 0.1 0.0 - 0.7 K/uL   Basophils Relative 0 0 - 1 %   Basophils Absolute 0.0 0.0 - 0.1 K/uL  I-stat troponin, ED     Status: None   Collection Time: 03/22/15  6:13 PM  Result Value Ref Range   Troponin i, poc 0.01 0.00 - 0.08 ng/mL   Comment 3            Comment: Due to the release kinetics of cTnI, a negative result within the first hours of the onset of symptoms does not rule out myocardial infarction with certainty. If myocardial infarction is still suspected, repeat the test at appropriate intervals.   I-Stat beta hCG blood, ED ([facility], [facility], [facility] only)     Status: None   Collection Time: 03/22/15  6:13 PM  Result Value Ref Range   I-stat hCG, quantitative <5.0 <5 mIU/mL   Comment 3            Comment:   GEST. AGE      CONC.  (mIU/mL)   <=1 WEEK        5 - 50     2 WEEKS       50 - 500     3 WEEKS       100 - 10,000     4 WEEKS     1,000 - 30,000        FEMALE AND NON-PREGNANT FEMALE:     LESS THAN 5 mIU/mL   Glucose, capillary     Status: Abnormal   Collection Time: 03/22/15 11:06 PM  Result Value Ref Range   Glucose-Capillary 110 (H) 65 - 99 mg/dL  Troponin I (q 6hr x 3)     Status: Abnormal   Collection Time: 03/23/15 12:36 AM  Result Value Ref Range   Troponin I 0.11 (H) <0.031 ng/mL    Comment:        PERSISTENTLY INCREASED TROPONIN VALUES IN THE RANGE OF 0.04-0.49 ng/mL CAN BE SEEN IN:       -UNSTABLE ANGINA       -CONGESTIVE HEART FAILURE        -MYOCARDITIS       -CHEST TRAUMA       -ARRYHTHMIAS       -LATE PRESENTING MYOCARDIAL INFARCTION       -COPD   CLINICAL FOLLOW-UP RECOMMENDED.   CBC with Differential/Platelet     Status: Abnormal   Collection Time: 03/23/15  5:57 AM  Result Value Ref Range   WBC 5.3 4.0 - 10.5 K/uL   RBC 4.10 3.87 - 5.11 MIL/uL   Hemoglobin 11.3 (L) 12.0 - 15.0 g/dL   HCT 35.1 (L) 36.0 - 46.0 %   MCV 85.6 78.0 - 100.0 fL   MCH 27.6 26.0 - 34.0 pg   MCHC 32.2 30.0 - 36.0 g/dL   RDW 13.9 11.5 - 15.5 %   Platelets 137 (L) 150 - 400 K/uL   Neutrophils Relative % 49 43 - 77 %  Neutro Abs 2.6 1.7 - 7.7 K/uL   Lymphocytes Relative 43 12 - 46 %   Lymphs Abs 2.3 0.7 - 4.0 K/uL   Monocytes Relative 6 3 - 12 %   Monocytes Absolute 0.3 0.1 - 1.0 K/uL   Eosinophils Relative 2 0 - 5 %   Eosinophils Absolute 0.1 0.0 - 0.7 K/uL   Basophils Relative 0 0 - 1 %   Basophils Absolute 0.0 0.0 - 0.1 K/uL  Comprehensive metabolic panel     Status: Abnormal   Collection Time: 03/23/15  5:57 AM  Result Value Ref Range   Sodium 140 135 - 145 mmol/L   Potassium 3.9 3.5 - 5.1 mmol/L   Chloride 106 101 - 111 mmol/L   CO2 26 22 - 32 mmol/L   Glucose, Bld 141 (H) 65 - 99 mg/dL   BUN 33 (H) 6 - 20 mg/dL   Creatinine, Ser 2.03 (H) 0.44 - 1.00 mg/dL   Calcium 9.1 8.9 - 10.3 mg/dL   Total Protein 7.3 6.5 - 8.1 g/dL   Albumin 3.6 3.5 - 5.0 g/dL   AST 36 15 - 41 U/L   ALT 25 14 - 54 U/L   Alkaline Phosphatase 47 38 - 126 U/L   Total Bilirubin 0.6 0.3 - 1.2 mg/dL   GFR calc non Af Amer 21 (L) >60 mL/min   GFR calc Af Amer 25 (L) >60 mL/min    Comment: (NOTE) The eGFR has been calculated using the CKD EPI equation. This calculation has not been validated in all clinical situations. eGFR's persistently <60 mL/min signify possible Chronic Kidney Disease.    Anion gap 8 5 - 15  Troponin I (q 6hr x 3)     Status: Abnormal   Collection Time: 03/23/15  5:57 AM  Result Value Ref Range   Troponin I 0.12 (H) <0.031 ng/mL     Comment:        PERSISTENTLY INCREASED TROPONIN VALUES IN THE RANGE OF 0.04-0.49 ng/mL CAN BE SEEN IN:       -UNSTABLE ANGINA       -CONGESTIVE HEART FAILURE       -MYOCARDITIS       -CHEST TRAUMA       -ARRYHTHMIAS       -LATE PRESENTING MYOCARDIAL INFARCTION       -COPD   CLINICAL FOLLOW-UP RECOMMENDED.   Glucose, capillary     Status: Abnormal   Collection Time: 03/23/15  9:09 AM  Result Value Ref Range   Glucose-Capillary 111 (H) 65 - 99 mg/dL   Dg Chest 2 View  03/22/2015   CLINICAL DATA:  Left-sided chest discomfort since yesterday. History of pulmonary embolism, hypertension and diabetes. Initial encounter.  EXAM: CHEST  2 VIEW  COMPARISON:  Radiographs 10/09/2013.  CT 11/29/2010.  FINDINGS: There is stable cardiac enlargement. The mediastinal contours are unchanged. The lungs are clear. There is no pleural effusion or pneumothorax. Postsurgical changes are present at the right shoulder. No acute osseous findings seen.  IMPRESSION: Stable cardiomegaly.  No acute cardiopulmonary process.   Electronically Signed   By: Richardean Sale M.D.   On: 03/22/2015 18:33    ROS: General:no colds or fevers, no weight changes Skin:no rashes or ulcers HEENT:no blurred vision, no congestion CV:see HPI PUL:see HPI- hx PE GI:no diarrhea constipation or melena, no indigestion GU:no hematuria, no dysuria MS:no joint pain, no claudication, + Lt neck pain with turning her head, Lt flank pain increased with palpatation Neuro:no syncope, no  lightheadedness Endo:+ diabetes, no thyroid disease   Blood pressure 150/75, pulse 49, temperature 98.8 F (37.1 C), temperature source Oral, resp. rate 18, height 5' 3" (1.6 m), weight 127 lb 6.4 oz (57.788 kg), SpO2 100 %.  Wt Readings from Last 3 Encounters:  03/22/15 127 lb 6.4 oz (57.788 kg)  01/01/15 122 lb 8 oz (55.566 kg)  12/12/14 121 lb (54.885 kg)    PE: General:Pleasant affect, NAD Skin:Warm and dry, brisk capillary  refill HEENT:normocephalic, sclera clear, mucus membranes moist Neck:supple, no JVD lying flat, no bruits, no adenopathy  Heart:S1S2 RRR without murmur, gallup, rub or click Lungs:clear without rales, rhonchi, or wheezes PQZ:RAQT, non tender, + BS, do not palpate liver spleen or masses Ext:no lower ext edema, 2+ pedal pulses, 2+ radial pulses, + pain to palpation Lt flank area above hip Neuro:alert and oriented X 3, MAE, follows commands, + facial symmetry TELE:  SB to 48 freq.   Assessment/Plan Principal Problem:   Chest pain- angina with elevated troponin, would check echo and possible stress test and adjust meds.  She has Cr of 2.03 and CKD  Stage 4.  Her pain chest pain does not seem that much worse than her usual.  But she does have elevated troponin.  She is having VQ scan now. MD to see.  Will add IV heparin.   Active Problems:    Essential hypertension- elevated the amlodipine causes her feet to swell at times. With low HR would decrease BB- ? With activity she has chronotropic incompetence increasing her SOB?    GERD   Diabetes   HX PAF, none currently.    Lt flank pain   [facility]  Nurse Practitioner Certified [place] Pager [phone] or after 5pm or weekends call [phone] 03/23/2015, 11:18 AM  Patient seen w/NP, agree with the above note.    Unusual story.  She has history of nonischemic cardiomyopathy with chronic LBBB, EF 30-35% in 5/15.  She has history of PAF but has not been on anticoagulation.  CKD stage IV.  She reports chronic episodes of chest aching for a year.  Not exertional.  She will have the chest aching at the same time that she gets palpitations.  Actually no chest pain since last Thursday.  She came to the ER complaining of severe cramping in her thighs.  She did not have chest pain and is not having chest pain.  She had troponin drawn for uncertain reasons.  Troponin 0.11 => 0.12.  No dyspnea.  1. Elevated troponin: No chest  pain since Thursday.  Has chronic CP pattern related to palpitations.  TnI 0.11 => 0.12. V/Q scan negative. I am not sure what is the exact etiology of the troponin rise => we cannot fully rule out NSTEMI but no trend so far to the troponin elevation and no chest pain.  She does not appear volume overloaded to suggest CHF with demand ischemia.  No significant arrhythmias so far.  - Heparin gtt for now but can stop if there is not a significant troponin rise.  Cycle troponin to peak.  - Continue ASA, can try low dose Crestor 5 mg daily (has had problems with statins in the past).  - Would manage conservatively => ok to get echo but would hold off on cath, stress test given age and CKD IV.  2. CKD stage IV: Stable, avoid nephrotoxins.  Would try to avoid giving contrast.  3. HTN: BP stable.  4. Cardiomyopathy: Echo 5/15  with EF 30-35%, has chronic LBBB.  She is on Toprol XL at home.  She is on hydralazine, can add Imdur 30 mg daily for synergy.  She does not appear volume overloaded on exam.  Plan repeat echo.  5. Thigh cramping: This is why she came to the ER.  Ongoing workup via primary team.  6. Paroxysmal atrial fibrillation: She says that she has chest aching when she has palpitations.  Monitor on tele to see if runs of PAF are the culprit.  She is not anticoagulated.   Loralie Champagne 03/23/2015 12:13 PM

## 2015-03-23 NOTE — Progress Notes (Addendum)
ANTICOAGULATION CONSULT NOTE - Initial Consult  Pharmacy Consult for heparin Indication: chest pain/ACS / ?PE  Allergies  Allergen Reactions  . Lisinopril     REACTION: INTOL to ACE's w/ cough  . Penicillins     REACTION: rash  . Statins     Muscle Aches    Patient Measurements: Height: 5\' 3"  (160 cm) Weight: 127 lb 6.4 oz (57.788 kg) IBW/kg (Calculated) : 52.4 Heparin Dosing Weight: 57.8 (actual BW)  Vital Signs: Temp: 98.8 F (37.1 C) (08/06 0613) Temp Source: Oral (08/06 6378) BP: 150/75 mmHg (08/06 0613) Pulse Rate: 49 (08/06 0613)  Labs:  Recent Labs  03/22/15 1755 03/23/15 0036 03/23/15 0557  HGB 11.6*  --  11.3*  HCT 34.6*  --  35.1*  PLT 126*  --  137*  CREATININE 2.08*  --  2.03*  TROPONINI  --  0.11* 0.12*    Estimated Creatinine Clearance: 16.8 mL/min (by C-G formula based on Cr of 2.03).   Medical History: Past Medical History  Diagnosis Date  . Pulmonary embolism 02/2009    on anticoag thru 11/2010 - stopped due to risk>benefit  . Hypertension   . CAD (coronary artery disease)     nonobst on cath 2010  . Cardiomyopathy     hypertensive  . Paroxysmal atrial fibrillation     not anticoag candidate  . Hypercholesterolemia   . Diabetes mellitus     hx noncompliance with rx'd tx  . Nontoxic multinodular goiter   . Pure hypercholesterolemia   . GERD (gastroesophageal reflux disease)     hx DU with H pylori 01/2009 and GIB, s/p triple rx  . Benign neoplasm of adrenal gland   . Diverticulosis of colon (without mention of hemorrhage)   . Calculus of kidney   . Anxiety   . H. pylori infection   . DJD (degenerative joint disease)   . Hx of subarachnoid hemorrhage 2001  . GI bleed 2012    duoedonal ulcer    Medications:  Prescriptions prior to admission  Medication Sig Dispense Refill Last Dose  . acetaminophen (TYLENOL) 650 MG CR tablet Take 1,300 mg by mouth every 8 (eight) hours as needed for pain.   03/22/2015 at Unknown time  .  amLODipine (NORVASC) 10 MG tablet Take 1 tablet (10 mg total) by mouth daily. (Patient taking differently: Take 10 mg by mouth at bedtime. ) 90 tablet 3 03/21/2015 at Unknown time  . aspirin EC 81 MG tablet Take 1 tablet (81 mg total) by mouth daily. (Patient taking differently: Take 81 mg by mouth at bedtime. ) 150 tablet 2 03/21/2015 at Unknown time  . losartan (COZAAR) 100 MG tablet Take 1 tablet (100 mg total) by mouth daily. (Patient taking differently: Take 100 mg by mouth at bedtime. ) 90 tablet 3 03/21/2015 at Unknown time  . metoprolol succinate (TOPROL-XL) 50 MG 24 hr tablet Take 1 tablet (50 mg total) by mouth daily. Take with or immediately following a meal. 90 tablet 3 03/21/2015 at Unknown time  . pioglitazone (ACTOS) 45 MG tablet Take 1 tablet (45 mg total) by mouth daily. (Patient taking differently: Take 45 mg by mouth at bedtime. ) 30 tablet 11 03/21/2015 at Unknown time  . repaglinide (PRANDIN) 2 MG tablet Take 1 tablet (2 mg total) by mouth 3 (three) times daily before meals. 90 tablet 11 03/21/2015 at Unknown time  . HYDROcodone-acetaminophen (NORCO/VICODIN) 5-325 MG per tablet Take 1-2 tablets by mouth every 4 (four) hours as needed. (Patient  not taking: Reported on 03/22/2015) 15 tablet 0 Not Taking at Unknown time    Assessment: Pt is a 81 yo F with afib who presented to the ER w/ severe leg cramping. She has intermittent CP d/t palpitations, but has not actually had any CP since last Thursday (including currently), troponin drawn for uncertain reasons per cardiology note. She does have hx of 1 prior PE, but was not on any anticoagulation PTA (besides ASA 81) d/t a history of intracranial hemorrhage and GI bleed.   Her troponin was 0.11 at admission and has mildly increased to 0.12. Troponins are continuing to be cycled and per Cardiology we can D/C heparin if there is not a significant rise. Her VQ scan was negative for PE.  Hgb 11.3, Plt 137, on ASA 81 Trop#1: 0.11, Trop#2: 0.12, Trop#3:  pending  Goal of Therapy:  Heparin level 0.3-0.7 units/ml Monitor platelets by anticoagulation protocol: Yes   Plan:  -I was consulted by the NP to start heparin IV for ACS/?PE. Initially I started the pt on 700 units/hr of heparin w/o a bolus. Shortly after, pt's nurse came to inquire b/c order had been changed from SubQ to IV to SubQ and back to IV. She said Dr. Aundra Dubin did not feel pt truly has ACS and was unsure why troponins had been drawn in the first place. Given pt's hx of PE but negative VQ scan (had not yet resulted when I initially entered orders) and very mild trop increase w/ low suspicion of STEMI, decision was made to switch back to SubQ heparin pending 3rd troponin result (ordered STAT).   -Heparin 5000 units TID SQ is currently ordered. -F/U troponin. If elevated, would recommend consulting with cardiology to determine if IV heparin should be resumed. -Monitor for signs of bleeding   Governor Specking, PharmD Clinical Pharmacy Resident Pager: [phone]  03/23/2015,1:58 PM   ADDENDUM Repeat troponin this afternoon still only 0.1. Will not start IV heparin at this time. Keep subq heparin as ordered.  Cyana Shook D. Trayquan Kolakowski, PharmD, BCPS Clinical Pharmacist Pager: [phone] 03/23/2015 4:47 PM

## 2015-03-23 NOTE — Progress Notes (Addendum)
PATIENT DETAILS Name: [name] Age: 81 y.o. Sex: female Date of Birth: 1927/10/01 Admit Date: 03/22/2015 Admitting Physician Lavina Hamman, MD SKA:[id number] Asa Lente, MD  Subjective: No further chest pain.  Assessment/Plan: Principal Problem: Chest pain: Suspect atypical-minimally elevated troponin-not in a pattern of ACS. EKG shows LBBB (old). Has prior history of one episode of pulmonary embolism-not on anticoagulation-reviewed outpatient notes in Epic, not a great long-term anticoagulation candidate-per patient history of intracranial hemorrhage and lower GI bleeding. Check a d-dimer, however patient already in VQ scan-if high probability-Will need to discuss with family/patient regarding options. Await echocardiogram and cards input  B/L Lower ext pain:no swelling seen-given prior hx of VTE-check Dopper  Essential hypertension: Uncontrolled, continue amlodipine, cautiously continue with metoprolol (pulse between 50-70) add hydralazine. Given CKD, would continue to hold losartan  CKD stage III: Creatinine close to usual baseline, follow periodically.  Type 2 Diabetes: Continue to hold oral hypoglycemics. CBGs currently controlled with SSI.  Chronic Systolic WIO:MBTD 9/74 with EF 30-35%. Compensated  Hx of BUL:AGTXM rhythm-not on anticoagulation-suspect given prior hx of ICH/Melena.Continue Metoprolol-monitor in tele.   Disposition: Remain inpatient-suspect home 8/7 if work up negative  Antimicrobial agents  See below  Anti-infectives    None      DVT Prophylaxis: Prophylactic Heparin   Code Status: Full code   Family Communication Daughter at bedside  Procedures: None  CONSULTS:  cardiology  Time spent 35 minutes-Greater than 50% of this time was spent in counseling, explanation of diagnosis, planning of further management, and coordination of care.  MEDICATIONS: Scheduled Meds: . amLODipine  10 mg Oral QHS  . aspirin EC  81 mg  Oral QHS  . insulin aspart  0-15 Units Subcutaneous TID WC  . insulin aspart  0-5 Units Subcutaneous QHS  . metoprolol succinate  50 mg Oral QHS   Continuous Infusions:  PRN Meds:.acetaminophen, methocarbamol, morphine injection, ondansetron (ZOFRAN) IV, technetium TC 59M diethylenetriame-pentaacetic acid    PHYSICAL EXAM: Vital signs in last 24 hours: Filed Vitals:   03/22/15 2230 03/22/15 2255 03/23/15 0022 03/23/15 0613  BP: 161/81 177/90 151/78 150/75  Pulse: 49 76  49  Temp:  98.1 F (36.7 C) 97.9 F (36.6 C) 98.8 F (37.1 C)  TempSrc:  Oral  Oral  Resp: 17 18 18 18   Height:  5\' 3"  (1.6 m)    Weight:  57.788 kg (127 lb 6.4 oz)    SpO2: 100% 96% 97% 100%    Weight change:  Filed Weights   03/22/15 2255  Weight: 57.788 kg (127 lb 6.4 oz)   Body mass index is 22.57 kg/(m^2).   Gen Exam: Awake and alert with clear speech.  Neck: Supple, No JVD.   Chest: B/L Clear.   CVS: S1 S2 Regular, no murmurs.  Abdomen: soft, BS +, non tender, non distended.  Extremities: no edema, lower extremities warm to touch. Neurologic: Non Focal.   Skin: No Rash.   Wounds: N/A.    Intake/Output from previous day: No intake or output data in the 24 hours ending 03/23/15 1124   LAB RESULTS: CBC  Recent Labs Lab 03/22/15 1755 03/23/15 0557  WBC 6.0 5.3  HGB 11.6* 11.3*  HCT 34.6* 35.1*  PLT 126* 137*  MCV 86.3 85.6  MCH 28.9 27.6  MCHC 33.5 32.2  RDW 13.8 13.9  LYMPHSABS 1.6 2.3  MONOABS 0.4 0.3  EOSABS 0.1 0.1  BASOSABS 0.0 0.0  Chemistries   Recent Labs Lab 03/22/15 1755 03/23/15 0557  NA 140 140  K 4.3 3.9  CL 109 106  CO2 21* 26  GLUCOSE 137* 141*  BUN 42* 33*  CREATININE 2.08* 2.03*  CALCIUM 9.4 9.1    CBG:  Recent Labs Lab 03/22/15 2306 03/23/15 0909  GLUCAP 110* 111*    GFR Estimated Creatinine Clearance: 16.8 mL/min (by C-G formula based on Cr of 2.03).  Coagulation profile No results for input(s): INR, PROTIME in the last 168  hours.  Cardiac Enzymes  Recent Labs Lab 03/23/15 0036 03/23/15 0557  TROPONINI 0.11* 0.12*    Invalid input(s): POCBNP No results for input(s): DDIMER in the last 72 hours. No results for input(s): HGBA1C in the last 72 hours. No results for input(s): CHOL, HDL, LDLCALC, TRIG, CHOLHDL, LDLDIRECT in the last 72 hours. No results for input(s): TSH, T4TOTAL, T3FREE, THYROIDAB in the last 72 hours.  Invalid input(s): FREET3 No results for input(s): VITAMINB12, FOLATE, FERRITIN, TIBC, IRON, RETICCTPCT in the last 72 hours. No results for input(s): LIPASE, AMYLASE in the last 72 hours.  Urine Studies No results for input(s): UHGB, CRYS in the last 72 hours.  Invalid input(s): UACOL, UAPR, USPG, UPH, UTP, UGL, UKET, UBIL, UNIT, UROB, ULEU, UEPI, UWBC, URBC, UBAC, CAST, UCOM, BILUA  MICROBIOLOGY: No results found for this or any previous visit (from the past 240 hour(s)).  RADIOLOGY STUDIES/RESULTS: Dg Chest 2 View  03/22/2015   CLINICAL DATA:  Left-sided chest discomfort since yesterday. History of pulmonary embolism, hypertension and diabetes. Initial encounter.  EXAM: CHEST  2 VIEW  COMPARISON:  Radiographs 10/09/2013.  CT 11/29/2010.  FINDINGS: There is stable cardiac enlargement. The mediastinal contours are unchanged. The lungs are clear. There is no pleural effusion or pneumothorax. Postsurgical changes are present at the right shoulder. No acute osseous findings seen.  IMPRESSION: Stable cardiomegaly.  No acute cardiopulmonary process.   Electronically Signed   By: Richardean Sale M.D.   On: 03/22/2015 18:33    Oren Binet, MD  [facility] Pager:[phone]  If 7PM-7AM, please contact night-coverage [email or web address] Password [email or web address] 03/23/2015, 11:24 AM

## 2015-03-23 NOTE — Progress Notes (Signed)
Pt's trop came back at 0.11. Pt has been CP free. NP on call made aware. No new orders received. Will cont to monitor pt.

## 2015-03-24 ENCOUNTER — Observation Stay (HOSPITAL_COMMUNITY): Payer: Medicare Other

## 2015-03-24 DIAGNOSIS — M79606 Pain in leg, unspecified: Secondary | ICD-10-CM

## 2015-03-24 DIAGNOSIS — I10 Essential (primary) hypertension: Secondary | ICD-10-CM

## 2015-03-24 DIAGNOSIS — R079 Chest pain, unspecified: Secondary | ICD-10-CM | POA: Diagnosis not present

## 2015-03-24 DIAGNOSIS — M549 Dorsalgia, unspecified: Secondary | ICD-10-CM | POA: Insufficient documentation

## 2015-03-24 DIAGNOSIS — M5440 Lumbago with sciatica, unspecified side: Secondary | ICD-10-CM | POA: Diagnosis not present

## 2015-03-24 DIAGNOSIS — R609 Edema, unspecified: Secondary | ICD-10-CM | POA: Diagnosis not present

## 2015-03-24 DIAGNOSIS — R0789 Other chest pain: Secondary | ICD-10-CM | POA: Diagnosis not present

## 2015-03-24 LAB — CBC
HCT: 33.4 % — ABNORMAL LOW (ref 36.0–46.0)
HEMOGLOBIN: 10.9 g/dL — AB (ref 12.0–15.0)
MCH: 27.7 pg (ref 26.0–34.0)
MCHC: 32.6 g/dL (ref 30.0–36.0)
MCV: 85 fL (ref 78.0–100.0)
PLATELETS: 133 10*3/uL — AB (ref 150–400)
RBC: 3.93 MIL/uL (ref 3.87–5.11)
RDW: 13.8 % (ref 11.5–15.5)
WBC: 4.9 10*3/uL (ref 4.0–10.5)

## 2015-03-24 LAB — MAGNESIUM: Magnesium: 2.1 mg/dL (ref 1.7–2.4)

## 2015-03-24 LAB — GLUCOSE, CAPILLARY
GLUCOSE-CAPILLARY: 188 mg/dL — AB (ref 65–99)
Glucose-Capillary: 112 mg/dL — ABNORMAL HIGH (ref 65–99)

## 2015-03-24 MED ORDER — METHOCARBAMOL 500 MG PO TABS: 500.0000 mg | ORAL_TABLET | Freq: Three times a day (TID) | ORAL | Status: DC | PRN

## 2015-03-24 MED ORDER — ROSUVASTATIN CALCIUM 5 MG PO TABS: 5.0000 mg | ORAL_TABLET | Freq: Every day | ORAL | Status: DC

## 2015-03-24 NOTE — Discharge Instructions (Signed)
Follow with Primary MD Gwendolyn Grant, MD in 7 days   Get CBC, CMP, 2 view Chest X ray checked  by Primary MD next visit.    Activity: As tolerated with Full fall precautions use walker/cane & assistance as needed   Disposition Home    Diet: Heart Healthy Low Carb  For Heart failure patients - Check your Weight same time everyday, if you gain over 2 pounds, or you develop in leg swelling, experience more shortness of breath or chest pain, call your Primary MD immediately. Follow Cardiac Low Salt Diet and 1.5 lit/day fluid restriction.   On your next visit with your primary care physician please Get Medicines reviewed and adjusted.   Please request your Prim.MD to go over all Hospital Tests and Procedure/Radiological results at the follow up, please get all Hospital records sent to your Prim MD by signing hospital release before you go home.   If you experience worsening of your admission symptoms, develop shortness of breath, life threatening emergency, suicidal or homicidal thoughts you must seek medical attention immediately by calling 911 or calling your MD immediately  if symptoms less severe.  You Must read complete instructions/literature along with all the possible adverse reactions/side effects for all the Medicines you take and that have been prescribed to you. Take any new Medicines after you have completely understood and accpet all the possible adverse reactions/side effects.   Do not drive, operating heavy machinery, perform activities at heights, swimming or participation in water activities or provide baby sitting services if your were admitted for syncope or siezures until you have seen by Primary MD or a Neurologist and advised to do so again.  Do not drive when taking Pain medications.    Do not take more than prescribed Pain, Sleep and Anxiety Medications  Special Instructions: If you have smoked or chewed Tobacco  in the last 2 yrs please stop smoking, stop any  regular Alcohol  and or any Recreational drug use.  Wear Seat belts while driving.   Please note  You were cared for by a hospitalist during your hospital stay. If you have any questions about your discharge medications or the care you received while you were in the hospital after you are discharged, you can call the unit and asked to speak with the hospitalist on call if the hospitalist that took care of you is not available. Once you are discharged, your primary care physician will handle any further medical issues. Please note that NO REFILLS for any discharge medications will be authorized once you are discharged, as it is imperative that you return to your primary care physician (or establish a relationship with a primary care physician if you do not have one) for your aftercare needs so that they can reassess your need for medications and monitor your lab values.

## 2015-03-24 NOTE — Progress Notes (Signed)
*  Preliminary Results* Bilateral lower extremity venous duplex completed. Bilateral lower extremities are negative for deep vein thrombosis. There is no evidence of Baker's cyst bilaterally.  03/24/2015  Maudry Mayhew, RVT, RDCS, RDMS

## 2015-03-24 NOTE — Progress Notes (Signed)
Subjective:  Ambulatory in hall without shortness of breath or chest pain.  Echo shows EF of 35-40%.  Consult note from yesterday appreciated.  No additional cardiovascular workup is necessary at this time.  Troponin elevations are nonspecific.  Objective:  Vital Signs in the last 24 hours: BP 153/70 mmHg  Pulse 52  Temp(Src) 98.6 F (37 C) (Oral)  Resp 18  Ht 5\' 3"  (1.6 m)  Wt 57.335 kg (126 lb 6.4 oz)  BMI 22.40 kg/m2  SpO2 98%  Physical Exam: Pleasant black female currently in no acute distress Lungs:  Clear Cardiac:  Regular rhythm, normal S1 and S2, no S3 Abdomen:  Soft, nontender, no masses Extremities:  No edema present  Intake/Output from previous day: 08/06 0701 - 08/07 0700 In: 480 [P.O.:480] Out: -   Weight Filed Weights   03/22/15 2255 03/24/15 0548  Weight: 57.788 kg (127 lb 6.4 oz) 57.335 kg (126 lb 6.4 oz)    Lab Results: Basic Metabolic Panel:  Recent Labs  03/22/15 1755 03/23/15 0557  NA 140 140  K 4.3 3.9  CL 109 106  CO2 21* 26  GLUCOSE 137* 141*  BUN 42* 33*  CREATININE 2.08* 2.03*   CBC:  Recent Labs  03/22/15 1755 03/23/15 0557 03/24/15 0403  WBC 6.0 5.3 4.9  NEUTROABS 3.9 2.6  --   HGB 11.6* 11.3* 10.9*  HCT 34.6* 35.1* 33.4*  MCV 86.3 85.6 85.0  PLT 126* 137* 133*   Cardiac Enzymes: Troponin (Point of Care Test)  Recent Labs  03/22/15 1813  TROPIPOC 0.01   Cardiac Panel (last 3 results)  Recent Labs  03/23/15 0036 03/23/15 0557 03/23/15 1535  TROPONINI 0.11* 0.12* 0.10*    Telemetry: Sinus rhythm-no atrial fibrillation is seen overnight  Assessment/Plan:  1.  Cardiomyopathy long-standing 2.  Elevation of troponin without evidence of acute coronary syndrome 3.  Thigh cramping of uncertain etiology 4.  Left bundle branch block. 5.  Stage IV chronic kidney disease  Recommendations:  No additional cardiovascular workup is necessary may be discharged home.  Would manage cardiomyopathy with hydralazine as  well as beta blockers.  No spironolactone or ACE inhibitor because of renal insufficiency.     Kerry Hough  MD [facility]  03/24/2015, 10:10 AM

## 2015-03-24 NOTE — Discharge Summary (Signed)
[name], is a 81 y.o. female  DOB August 12, 1928  MRN [id number].  Admission date:  03/22/2015  Admitting Physician  Lavina Hamman, MD  Discharge Date:  03/24/2015   Primary MD  Gwendolyn Grant, MD  Recommendations for primary care physician for things to follow:    Monitor secondary risk factors for CAD, outpatient cardiology follow-up.   Admission Diagnosis  Chest pain [R07.9] Chest pain, unspecified chest pain type [R07.9]   Discharge Diagnosis  Chest pain [R07.9] Chest pain, unspecified chest pain type [R07.9]     Principal Problem:   Chest pain Active Problems:   Essential hypertension   GERD   Diabetes   Back pain   Pain in the chest   Leg pain      Past Medical History  Diagnosis Date  . Pulmonary embolism 02/2009    on anticoag thru 11/2010 - stopped due to risk>benefit  . Hypertension   . CAD (coronary artery disease)     nonobst on cath 2010  . Cardiomyopathy     hypertensive  . Paroxysmal atrial fibrillation     not anticoag candidate  . Hypercholesterolemia   . Diabetes mellitus     hx noncompliance with rx'd tx  . Nontoxic multinodular goiter   . Pure hypercholesterolemia   . GERD (gastroesophageal reflux disease)     hx DU with H pylori 01/2009 and GIB, s/p triple rx  . Benign neoplasm of adrenal gland   . Diverticulosis of colon (without mention of hemorrhage)   . Calculus of kidney   . Anxiety   . H. pylori infection   . DJD (degenerative joint disease)   . Hx of subarachnoid hemorrhage 2001  . GI bleed 2012    duoedonal ulcer    Past Surgical History  Procedure Laterality Date  . Knee surgery      left  . Shoulder surgery      right  . Cataract extraction         HPI  from the history and physical done on the day of admission:   [name] is a  81 y.o. female with Past medical history of history of pulmonary embolism not on any anticoagulation, coronary artery disease nonobstructive, atrial fibrillation on any anticoagulation, diabetes mellitus, neuropathy, GERD, hypertension. The patient is presenting with complaints of chest pain the pain is starting tonight and was located on the left side of the chest. It is intermittent and comes and goes and feels like sharp stabbing pain. She also has progressively worsening shortness of breath since last one week which is worsening with exertion. She denies any fever or chills orthopnea or PND is denies any nausea or vomiting diarrhea or constipation. She mentions she is compliant with all her medications. She denies any fever or chills burning urination no focal deficit. At the time of my evaluation she remains chest pain-free  The patient is coming from home. At her baseline ambulates without any support. And is independent for most of her ADL  manages her medication on her own.     Hospital Course:     Chest pain: Atypical non-ACS chest pain, likely musculoskeletal. She had minimally elevated troponin-not in a pattern of ACS. EKG shows LBBB (old). Has prior history of one episode of pulmonary embolism-not on anticoagulation-reviewed outpatient notes in Epic, not a great long-term anticoagulation candidate-per patient history of intracranial hemorrhage and lower GI bleeding. She underwent a normal VQ scan and lower extremity venous duplex. Echocardiogram showed old mildly depressed EF which is actually now somewhat improved as compared to previous echo with EF of 40-45% up from 30-35%. Old hypokinesis. Seen by cardiology cleared for discharge. Will follow with cardiology outpatient.  B/L Lower ext pain: Sounded more like muscle cramps, lower extremity venous duplex unremarkable. When necessary muscle relaxant upon discharge. Electrolytes stable including potassium and magnesium.  Essential  hypertension: Continue home medications. Request PCP to monitor blood pressure and adjust meds as needed.  CKD stage III: Creatinine close to usual baseline, follow periodically.  Type 2 Diabetes: Continue to hold oral hypoglycemics. PCP to Monitor CBGs and glycemic control in the outpatient setting.  Chronic Systolic CHF: EF has not improved from 30-35% to 40-45% now. Continue combination of ARB, beta blocker. Monitor renal function closely.  Hx of PAF: sinus rhythm-not on anticoagulation-suspect given prior hx of ICH/Melena.Continue, outpatient cardiology follow-up post discharge.     Discharge Condition: Stable  Follow UP  Follow-up Information    Follow up with Gwendolyn Grant, MD. Schedule an appointment as soon as possible for a visit in 1 week.   Specialty:  Internal Medicine   Contact information:   [address] [place] [address] [phone]       Follow up with [facility]. Schedule an appointment as soon as possible for a visit in 1 week.   Contact information:   [address] [place] [address]        Consults obtained - Cards  Diet and Activity recommendation: See Discharge Instructions below  Discharge Instructions       Discharge Instructions    Discharge instructions    Complete by:  As directed   Follow with Primary MD Gwendolyn Grant, MD in 7 days   Get CBC, CMP, 2 view Chest X ray checked  by Primary MD next visit.    Activity: As tolerated with Full fall precautions use walker/cane & assistance as needed   Disposition Home    Diet: Heart Healthy Low Carb  For Heart failure patients - Check your Weight same time everyday, if you gain over 2 pounds, or you develop in leg swelling, experience more shortness of breath or chest pain, call your Primary MD immediately. Follow Cardiac Low Salt Diet and 1.5 lit/day fluid restriction.   On your next visit with your primary  care physician please Get Medicines reviewed and adjusted.   Please request your Prim.MD to go over all Hospital Tests and Procedure/Radiological results at the follow up, please get all Hospital records sent to your Prim MD by signing hospital release before you go home.   If you experience worsening of your admission symptoms, develop shortness of breath, life threatening emergency, suicidal or homicidal thoughts you must seek medical attention immediately by calling 911 or calling your MD immediately  if symptoms less severe.  You Must read complete instructions/literature along with all the possible adverse reactions/side effects for all the Medicines you take and  that have been prescribed to you. Take any new Medicines after you have completely understood and accpet all the possible adverse reactions/side effects.   Do not drive, operating heavy machinery, perform activities at heights, swimming or participation in water activities or provide baby sitting services if your were admitted for syncope or siezures until you have seen by Primary MD or a Neurologist and advised to do so again.  Do not drive when taking Pain medications.    Do not take more than prescribed Pain, Sleep and Anxiety Medications  Special Instructions: If you have smoked or chewed Tobacco  in the last 2 yrs please stop smoking, stop any regular Alcohol  and or any Recreational drug use.  Wear Seat belts while driving.   Please note  You were cared for by a hospitalist during your hospital stay. If you have any questions about your discharge medications or the care you received while you were in the hospital after you are discharged, you can call the unit and asked to speak with the hospitalist on call if the hospitalist that took care of you is not available. Once you are discharged, your primary care physician will handle any further medical issues. Please note that NO REFILLS for any discharge medications will be  authorized once you are discharged, as it is imperative that you return to your primary care physician (or establish a relationship with a primary care physician if you do not have one) for your aftercare needs so that they can reassess your need for medications and monitor your lab values.     Increase activity slowly    Complete by:  As directed              Discharge Medications       Medication List    TAKE these medications        acetaminophen 650 MG CR tablet  Commonly known as:  TYLENOL  Take 1,300 mg by mouth every 8 (eight) hours as needed for pain.     amLODipine 10 MG tablet  Commonly known as:  NORVASC  Take 1 tablet (10 mg total) by mouth daily.     aspirin EC 81 MG tablet  Take 1 tablet (81 mg total) by mouth daily.     HYDROcodone-acetaminophen 5-325 MG per tablet  Commonly known as:  NORCO/VICODIN  Take 1-2 tablets by mouth every 4 (four) hours as needed.     losartan 100 MG tablet  Commonly known as:  COZAAR  Take 1 tablet (100 mg total) by mouth daily.     methocarbamol 500 MG tablet  Commonly known as:  ROBAXIN  Take 1 tablet (500 mg total) by mouth every 8 (eight) hours as needed for muscle spasms.     metoprolol succinate 50 MG 24 hr tablet  Commonly known as:  TOPROL-XL  Take 1 tablet (50 mg total) by mouth daily. Take with or immediately following a meal.     pioglitazone 45 MG tablet  Commonly known as:  ACTOS  Take 1 tablet (45 mg total) by mouth daily.     repaglinide 2 MG tablet  Commonly known as:  PRANDIN  Take 1 tablet (2 mg total) by mouth 3 (three) times daily before meals.     rosuvastatin 5 MG tablet  Commonly known as:  CRESTOR  Take 1 tablet (5 mg total) by mouth daily at 6 PM.        Major procedures and Radiology Reports - PLEASE review  detailed and final reports for all details, in brief -   TTE  - Left ventricle: The cavity size was mildly dilated. Wallthickness was normal. Systolic function was moderately  toseverely reduced. The estimated ejection fraction was in the range of 30% to 35%. Diffuse hypokinesis. Features are consistentwith a pseudonormal left ventricular filling pattern, withconcomitant abnormal relaxation and increased filling pressure(grade 2 diastolic dysfunction). - Left atrium: The atrium was mildly dilated. - Atrial septum: The septum bowed from left to right, consistent with increased left atrial pressure. No defect or patent foramen ovale was identified. There was an atrial septal aneurysm.  Impressions:  The findings indicate significant global left ventricular wall dyssynchrony.   Bilateral lower extremity venous duplex completed. Bilateral lower extremities are negative for deep vein thrombosis. There is no evidence of Baker's cyst bilaterally.   Dg Chest 2 View  03/22/2015   CLINICAL DATA:  Left-sided chest discomfort since yesterday. History of pulmonary embolism, hypertension and diabetes. Initial encounter.  EXAM: CHEST  2 VIEW  COMPARISON:  Radiographs 10/09/2013.  CT 11/29/2010.  FINDINGS: There is stable cardiac enlargement. The mediastinal contours are unchanged. The lungs are clear. There is no pleural effusion or pneumothorax. Postsurgical changes are present at the right shoulder. No acute osseous findings seen.  IMPRESSION: Stable cardiomegaly.  No acute cardiopulmonary process.   Electronically Signed   By: Richardean Sale M.D.   On: 03/22/2015 18:33   Dg Lumbar Spine 2-3 Views  03/23/2015   CLINICAL DATA:  81 year old female with left-sided low back pain.  EXAM: LUMBAR SPINE - 2-3 VIEW  COMPARISON:  CT the abdomen and pelvis 10/09/2013.  FINDINGS: Three views of the lumbar spine demonstrate no definite acute displaced fracture or compression type fracture. There is mild levoscoliosis of the lumbar spine convex to the left at the level of L3. 4 mm of anterolisthesis of L4 upon L5 is unchanged. Alignment is otherwise anatomic. Mild multilevel degenerative disc  disease, most pronounced at L4-L5. Multilevel facet arthropathy, most severe at L4-L5 and L5-S1.  IMPRESSION: 1. No acute abnormality of the lumbar spine. 2. Multilevel degenerative disc disease, lumbar spondylosis and mild levoscoliosis of the lumbar spine, similar prior studies, as above.   Electronically Signed   By: Vinnie Langton M.D.   On: 03/23/2015 12:34   Nm Pulmonary Perf And Vent  03/23/2015   CLINICAL DATA:  Patient presents 03/22/15 with intermittent left-sided chest pain that comes and goes throughout the day. Pain lasted a few minutes. She has shortness of breath that is worse with exertion. Patient with remote history of pulmonary embolism no longer on anticoagulation.  EXAM: NUCLEAR MEDICINE VENTILATION - PERFUSION LUNG SCAN  TECHNIQUE: Ventilation images were obtained in multiple projections using inhaled aerosol Tc-81m DTPA. Perfusion images were obtained in multiple projections after intravenous injection of Tc-51m MAA.  RADIOPHARMACEUTICALS:  38.3 mCi IV Technetium-59m DTPA aerosol inhalation and 5.96 mCi of Technetium-74m MAA IV  COMPARISON:  Chest radiograph, 03/22/2015  FINDINGS: Ventilation: No focal ventilation defect.  Perfusion: No wedge shaped peripheral perfusion defects to suggest acute pulmonary embolism.  IMPRESSION: Normal ventilation-perfusion study. No evidence of pulmonary thromboembolism.   Electronically Signed   By: Lajean Manes M.D.   On: 03/23/2015 11:58    Micro Results      No results found for this or any previous visit (from the past 240 hour(s)).     Today   Subjective    [name] today has no headache,no chest abdominal pain,no new weakness tingling  or numbness, feels much better wants to go home today.    Objective   Blood pressure 153/70, pulse 52, temperature 98.6 F (37 C), temperature source Oral, resp. rate 18, height 5\' 3"  (1.6 m), weight 57.335 kg (126 lb 6.4 oz), SpO2 98 %.   Intake/Output Summary (Last 24 hours) at 03/24/15  1426 Last data filed at 03/24/15 0800  Gross per 24 hour  Intake    360 ml  Output      0 ml  Net    360 ml    Exam Awake Alert, Oriented x 3, No new F.N deficits, Normal affect [place].AT,PERRAL Supple Neck,No JVD, No cervical lymphadenopathy appriciated.  Symmetrical Chest wall movement, Good air movement bilaterally, CTAB RRR,No Gallops,Rubs or new Murmurs, No Parasternal Heave +ve B.Sounds, Abd Soft, Non tender, No organomegaly appriciated, No rebound -guarding or rigidity. No Cyanosis, Clubbing or edema, No new Rash or bruise   Data Review   CBC w Diff: Lab Results  Component Value Date   WBC 4.9 03/24/2015   HGB 10.9* 03/24/2015   HCT 33.4* 03/24/2015   PLT 133* 03/24/2015   LYMPHOPCT 43 03/23/2015   MONOPCT 6 03/23/2015   EOSPCT 2 03/23/2015   BASOPCT 0 03/23/2015    CMP: Lab Results  Component Value Date   NA 140 03/23/2015   K 3.9 03/23/2015   CL 106 03/23/2015   CO2 26 03/23/2015   BUN 33* 03/23/2015   CREATININE 2.03* 03/23/2015   PROT 7.3 03/23/2015   ALBUMIN 3.6 03/23/2015   BILITOT 0.6 03/23/2015   ALKPHOS 47 03/23/2015   AST 36 03/23/2015   ALT 25 03/23/2015  .   Total Time in preparing paper work, data evaluation and todays exam - 35 minutes  Thurnell Lose M.D on 03/24/2015 at 2:26 PM  [facility]   Office  [phone]

## 2015-03-25 ENCOUNTER — Telehealth: Payer: Self-pay | Admitting: *Deleted

## 2015-03-25 LAB — HEMOGLOBIN A1C
Hgb A1c MFr Bld: 7.8 % — ABNORMAL HIGH (ref 4.8–5.6)
MEAN PLASMA GLUCOSE: 177 mg/dL

## 2015-03-25 NOTE — Telephone Encounter (Signed)
Transition Care Management Follow-up Telephone Call   Date discharged? D/C 03/24/15   How have you been since you were released from the hospital? Pt states she is feeling fairly well   Do you understand why you were in the hospital? YES   Do you understand the discharge instructions? YES   Where were you discharged to? Home   Items Reviewed:  Medications reviewed: YES  Allergies reviewed: YES  Dietary changes reviewed: YES  Referrals reviewed: No referral needed   Functional Questionnaire:   Activities of Daily Living (ADLs):   She states she are independent in the following: ambulation, bathing and hygiene, feeding, continence, grooming, toileting and dressing States they require assistance with the following: ambulation sometimes   Any transportation issues/concerns?: NO   Any patient concerns? NO   Confirmed importance and date/time of follow-up visits scheduled YES, appt made 03/28/15  Provider Appointment booked with Dr. Linna Darner  Confirmed with patient if condition begins to worsen call PCP or go to the ER.  Patient was given the office number and encouraged to call back with question or concerns.  : YES

## 2015-03-26 ENCOUNTER — Encounter: Payer: Self-pay | Admitting: Endocrinology

## 2015-03-26 ENCOUNTER — Ambulatory Visit (INDEPENDENT_AMBULATORY_CARE_PROVIDER_SITE_OTHER): Payer: Medicare Other | Admitting: Endocrinology

## 2015-03-26 ENCOUNTER — Telehealth: Payer: Self-pay | Admitting: *Deleted

## 2015-03-26 VITALS — BP 137/88 | HR 82 | Temp 97.7°F | Ht 62.0 in | Wt 125.0 lb

## 2015-03-26 DIAGNOSIS — E119 Type 2 diabetes mellitus without complications: Secondary | ICD-10-CM | POA: Diagnosis not present

## 2015-03-26 NOTE — Progress Notes (Signed)
Subjective:    Patient ID: [name], female    DOB: 02/07/1928, 81 y.o.   MRN: [id number]  HPI Pt returns for f/u of diabetes mellitus: DM type: 2 Dx'ed: 7062 Complications: polyneuropathy, renal insufficiency, TIA, and CAD Therapy: 2 oral meds GDM: never DKA: never Severe hypoglycemia: never.   Pancreatitis: never. Other: she took insulin 2014-2015; she cannot take metformin or invokana due to renal insufficiency.  Interval history: no cbg record, but states cbg's are well-controlled.  pt states she feels well in general.  She seldom misses the prandin.   Past Medical History  Diagnosis Date  . Pulmonary embolism 02/2009    on anticoag thru 11/2010 - stopped due to risk>benefit  . Hypertension   . CAD (coronary artery disease)     nonobst on cath 2010  . Cardiomyopathy     hypertensive  . Paroxysmal atrial fibrillation     not anticoag candidate  . Hypercholesterolemia   . Diabetes mellitus     hx noncompliance with rx'd tx  . Nontoxic multinodular goiter   . Pure hypercholesterolemia   . GERD (gastroesophageal reflux disease)     hx DU with H pylori 01/2009 and GIB, s/p triple rx  . Benign neoplasm of adrenal gland   . Diverticulosis of colon (without mention of hemorrhage)   . Calculus of kidney   . Anxiety   . H. pylori infection   . DJD (degenerative joint disease)   . Hx of subarachnoid hemorrhage 2001  . GI bleed 2012    duoedonal ulcer    Past Surgical History  Procedure Laterality Date  . Knee surgery      left  . Shoulder surgery      right  . Cataract extraction      History   Social History  . Marital Status: Widowed    Spouse Name: N/A  . Number of Children: 1  . Years of Education: N/A   Occupational History  . retired    Social History Main Topics  . Smoking status: Former Smoker -- 0.30 packs/day for 36 years    Types: Cigarettes    Quit date: 08/18/1983  . Smokeless tobacco: Never Used  . Alcohol Use: 0.6 oz/week    1  Glasses of wine per week     Comment: occ wine, beer during holidays or at social events  . Drug Use: No  . Sexual Activity: Not Currently   Other Topics Concern  . Not on file   Social History Narrative    Current Outpatient Prescriptions on File Prior to Visit  Medication Sig Dispense Refill  . acetaminophen (TYLENOL) 650 MG CR tablet Take 1,300 mg by mouth every 8 (eight) hours as needed for pain.    Marland Kitchen amLODipine (NORVASC) 10 MG tablet Take 1 tablet (10 mg total) by mouth daily. (Patient taking differently: Take 10 mg by mouth at bedtime. ) 90 tablet 3  . aspirin EC 81 MG tablet Take 1 tablet (81 mg total) by mouth daily. (Patient taking differently: Take 81 mg by mouth at bedtime. ) 150 tablet 2  . HYDROcodone-acetaminophen (NORCO/VICODIN) 5-325 MG per tablet Take 1-2 tablets by mouth every 4 (four) hours as needed. 15 tablet 0  . losartan (COZAAR) 100 MG tablet Take 1 tablet (100 mg total) by mouth daily. (Patient taking differently: Take 100 mg by mouth at bedtime. ) 90 tablet 3  . methocarbamol (ROBAXIN) 500 MG tablet Take 1 tablet (500 mg total) by mouth  every 8 (eight) hours as needed for muscle spasms. 20 tablet 0  . metoprolol succinate (TOPROL-XL) 50 MG 24 hr tablet Take 1 tablet (50 mg total) by mouth daily. Take with or immediately following a meal. 90 tablet 3  . pioglitazone (ACTOS) 45 MG tablet Take 1 tablet (45 mg total) by mouth daily. (Patient taking differently: Take 45 mg by mouth at bedtime. ) 30 tablet 11  . repaglinide (PRANDIN) 2 MG tablet Take 1 tablet (2 mg total) by mouth 3 (three) times daily before meals. 90 tablet 11  . rosuvastatin (CRESTOR) 5 MG tablet Take 1 tablet (5 mg total) by mouth daily at 6 PM. 30 tablet 0   No current facility-administered medications on file prior to visit.    Allergies  Allergen Reactions  . Lisinopril     REACTION: INTOL to ACE's w/ cough  . Penicillins     REACTION: rash  . Statins     Muscle Aches    Family History    Problem Relation Age of Onset  . Diabetes Maternal Grandmother   . Heart disease Maternal Aunt     x2  . Lung cancer Mother   . Brain cancer Mother   . Goiter Mother   . Cancer Mother   . Heart disease Mother   . Diabetes Mother   . Colon cancer Neg Hx     BP 137/88 mmHg  Pulse 82  Temp(Src) 97.7 F (36.5 C) (Oral)  Ht 5\' 2"  (1.575 m)  Wt 125 lb (56.7 kg)  BMI 22.86 kg/m2  SpO2 97%  Review of Systems Denies weight change    Objective:   Physical Exam VITAL SIGNS:  See vs page GENERAL: no distress Pulses: dorsalis pedis intact bilat.   MSK: no deformity of the feet CV: no leg edema Skin:  no ulcer on the feet.  normal color and temp on the feet. Neuro: sensation is intact to touch on the feet   Lab Results  Component Value Date   HGBA1C 7.8* 03/23/2015      Assessment & Plan:  DM: well-controlled, given limitations of advanced age and multiple drug contraindications.  Patient is advised the following: Patient Instructions  check your blood sugar once a day.  vary the time of day when you check, between before the 3 meals, and at bedtime.  also check if you have symptoms of your blood sugar being too high or too low.  please keep a record of the readings and bring it to your next appointment here.  You can write it on any piece of paper.  please call us sooner if your blood sugar goes below 70, or if you have a lot of readings over 200.  Please continue the same diabetes medications.  Please come back for a follow-up appointment in 3 months.

## 2015-03-26 NOTE — Patient Instructions (Addendum)
check your blood sugar once a day.  vary the time of day when you check, between before the 3 meals, and at bedtime.  also check if you have symptoms of your blood sugar being too high or too low.  please keep a record of the readings and bring it to your next appointment here.  You can write it on any piece of paper.  please call us sooner if your blood sugar goes below 70, or if you have a lot of readings over 200.  Please continue the same diabetes medications.  Please come back for a follow-up appointment in 3 months.

## 2015-03-26 NOTE — Telephone Encounter (Signed)
[facility] Patient Name: [name] Gender: Female DOB: August 24, 1927 Age: 81 Y 3 M 16 D Return Phone Number: [phone] (Primary) Address: City/State/Zip: [place] [address] Client [facility] - Client Client Site [place] - Day Physician Gwendolyn Grant Contact Type Call Call Type Triage / Clinical Relationship To Patient Self Appointment Disposition EMR Appointment Not Necessary Info pasted into Epic Yes Return Phone Number [phone] (Primary) Chief Complaint Leg Pain Initial Comment Caller states her legs are in pain. PreDisposition Home Care Nurse Assessment Nurse: Martyn Ehrich, RN, Solmon Ice Date/Time Eilene Ghazi Time): 03/22/2015 4:15:46 PM Confirm and document reason for call. If symptomatic, describe symptoms. ---Pt is having terrible pains in her upper thighs onset for a few months. It is off and on. No fever Has the patient traveled out of the country within the last 30 days? ---No Does the patient require triage? ---Yes Related visit to physician within the last 2 weeks? ---No Does the PT have any chronic conditions? (i.e. diabetes, asthma, etc.) ---Yes List chronic conditions. ---DM HTN high cholesterol CHF Guidelines Guideline Title Affirmed Question Affirmed Notes Nurse Date/Time Eilene Ghazi Time) Leg Pain History of prior "blood clot" in leg or lungs (i.e., deep vein thrombosis, pulmonary embolism) Gaddy, RN, Felicia 09/24/7865 6:72:09 PM Disp. Time Eilene Ghazi Time) Disposition Final User 03/22/2015 4:33:34 PM Call Completed Martyn Ehrich, RN, Solmon Ice 11/21/960 8:36:62 PM See Physician within 4 Hours (or PCP triage) Yes Gaddy, RN, Alphia Kava Understands: Yes Disagree/Comply: Comply PLEASE NOTE: All timestamps contained within this report are represented as [place] Standard Time. CONFIDENTIALTY NOTICE: This fax transmission is intended only for the  addressee. It contains information that is legally privileged, confidential or otherwise protected from use or disclosure. If you are not the intended recipient, you are strictly prohibited from reviewing, disclosing, copying using or disseminating any of this information or taking any action in reliance on or regarding this information. If you have received this fax in error, please notify us immediately by telephone so that we can arrange for its return to [place]. Phone: [phone], Toll-Free: [phone], Fax: [phone] Page: 2 of 2 Call Id: [id number] Care Advice Given Per Guideline SEE PHYSICIAN WITHIN 4 HOURS (or PCP triage): * You become worse. After Care Instructions Given Call Event Type User Date / Time Description Comments User: Daphene Calamity, RN Date/Time Eilene Ghazi Time): 03/22/2015 4:24:01 PM upgraded to ER in case it is related to circulation User: Daphene Calamity, RN Date/Time Eilene Ghazi Time): 03/22/2015 4:27:09 PM Told her get someone to drive her - she said she may have to wait until neighbors come home from work to take her Referrals [facility] - ED

## 2015-03-28 ENCOUNTER — Inpatient Hospital Stay: Payer: Medicare Other | Admitting: Internal Medicine

## 2015-03-28 DIAGNOSIS — R4689 Other symptoms and signs involving appearance and behavior: Secondary | ICD-10-CM | POA: Insufficient documentation

## 2015-04-01 ENCOUNTER — Ambulatory Visit (INDEPENDENT_AMBULATORY_CARE_PROVIDER_SITE_OTHER): Payer: Medicare Other | Admitting: Physician Assistant

## 2015-04-01 ENCOUNTER — Encounter: Payer: Self-pay | Admitting: Physician Assistant

## 2015-04-01 VITALS — BP 142/74 | HR 62 | Ht 63.0 in | Wt 128.2 lb

## 2015-04-01 DIAGNOSIS — I429 Cardiomyopathy, unspecified: Secondary | ICD-10-CM | POA: Diagnosis not present

## 2015-04-01 DIAGNOSIS — I428 Other cardiomyopathies: Secondary | ICD-10-CM

## 2015-04-01 NOTE — Progress Notes (Signed)
Patient ID: [name], female   DOB: [birth year], 81 y.o.   MRN: [id number]    Date:  04/01/2015   ID:  [name], DOB 1927-11-27, MRN [id number]  PCP:  Gwendolyn Grant, MD  Primary Cardiologist:  [facility]  Chief Complaint  Patient presents with  . Hospitalization Follow-up    complains of feet swelling when on them along time, complains of some shortness of breath     History of Present Illness: [name] is a 80 y.o. female with hx of HTN, nonobstructive cath 2010 ( scattered disease of LAD with max stenosis in range of 50% after the diagonal takeoff), and last Nuc study 2013 was negative for ischemia. She has moderately reduced EF, Dr. Percival Spanish suspected hypertensive cardiomyopathy with last echo 12/2013 with EF 30-35% and G2DD, the findings indicate significant global left ventricular wall dyssynchrony. She also has hx of stable chest pain. Hx of PAF but not an anticoag. Candidate(history of intracranial hemorrhage).  Her last echocardiogram was 03/23/2015 and the ejection fraction was 35-40% with moderate diffuse hypokinesis. She has grade 2 diastolic dysfunction. Moderate mitral valve regurgitation. The left atrium severely dilated. There is a medium-sized atrial septal aneurysm..  Patient presents for posthospital follow-up. She was admitted from August 5 to 03/24/2015 with atypical chest pain. Was thought to be musculoskeletal. Troponin was minimally elevated and a non-ACS pattern.. She is not on spironolactone or ACE inhibitor because of renal insufficiency. Patient reports she feels quite well and has no particular complaints other than some dyspnea with exertion. She denies nausea, vomiting, fever, chest pain, shortness of breath, orthopnea, dizziness, PND, cough, congestion, abdominal pain, hematochezia, melena, lower extremity edema, claudication.  Wt Readings from Last 3 Encounters:  04/01/15 128 lb 3.2 oz (58.151 kg)  03/26/15 125 lb (56.7 kg)    03/24/15 126 lb 6.4 oz (57.335 kg)     Past Medical History  Diagnosis Date  . Pulmonary embolism 02/2009    on anticoag thru 11/2010 - stopped due to risk>benefit  . Hypertension   . CAD (coronary artery disease)     nonobst on cath 2010  . Cardiomyopathy     hypertensive  . Paroxysmal atrial fibrillation     not anticoag candidate  . Hypercholesterolemia   . Diabetes mellitus     hx noncompliance with rx'd tx  . Nontoxic multinodular goiter   . Pure hypercholesterolemia   . GERD (gastroesophageal reflux disease)     hx DU with H pylori 01/2009 and GIB, s/p triple rx  . Benign neoplasm of adrenal gland   . Diverticulosis of colon (without mention of hemorrhage)   . Calculus of kidney   . Anxiety   . H. pylori infection   . DJD (degenerative joint disease)   . Hx of subarachnoid hemorrhage 2001  . GI bleed 2012    duoedonal ulcer    Current Outpatient Prescriptions  Medication Sig Dispense Refill  . acetaminophen (TYLENOL) 650 MG CR tablet Take 1,300 mg by mouth every 8 (eight) hours as needed for pain.    Marland Kitchen amLODipine (NORVASC) 10 MG tablet Take 1 tablet (10 mg total) by mouth daily. (Patient taking differently: Take 10 mg by mouth at bedtime. ) 90 tablet 3  . aspirin EC 81 MG tablet Take 1 tablet (81 mg total) by mouth daily. (Patient taking differently: Take 81 mg by mouth at bedtime. ) 150 tablet 2  . HYDROcodone-acetaminophen (NORCO/VICODIN) 5-325 MG per tablet Take 1-2 tablets by mouth every  4 (four) hours as needed. 15 tablet 0  . losartan (COZAAR) 100 MG tablet Take 1 tablet (100 mg total) by mouth daily. (Patient taking differently: Take 100 mg by mouth at bedtime. ) 90 tablet 3  . methocarbamol (ROBAXIN) 500 MG tablet Take 1 tablet (500 mg total) by mouth every 8 (eight) hours as needed for muscle spasms. 20 tablet 0  . metoprolol succinate (TOPROL-XL) 50 MG 24 hr tablet Take 1 tablet (50 mg total) by mouth daily. Take with or immediately following a meal. 90 tablet 3   . pioglitazone (ACTOS) 45 MG tablet Take 1 tablet (45 mg total) by mouth daily. (Patient taking differently: Take 45 mg by mouth at bedtime. ) 30 tablet 11  . repaglinide (PRANDIN) 2 MG tablet Take 1 tablet (2 mg total) by mouth 3 (three) times daily before meals. 90 tablet 11  . rosuvastatin (CRESTOR) 5 MG tablet Take 1 tablet (5 mg total) by mouth daily at 6 PM. 30 tablet 0   No current facility-administered medications for this visit.    Allergies:    Allergies  Allergen Reactions  . Lisinopril     REACTION: INTOL to ACE's w/ cough  . Penicillins     REACTION: rash  . Statins     Muscle Aches    Social History:  The patient  reports that she quit smoking about 31 years ago. Her smoking use included Cigarettes. She has a 10.8 pack-year smoking history. She has never used smokeless tobacco. She reports that she drinks about 0.6 oz of alcohol per week. She reports that she does not use illicit drugs.   Family history:   Family History  Problem Relation Age of Onset  . Diabetes Maternal Grandmother   . Heart disease Maternal Aunt     x2  . Lung cancer Mother   . Brain cancer Mother   . Goiter Mother   . Cancer Mother   . Heart disease Mother   . Diabetes Mother   . Colon cancer Neg Hx     ROS:  Please see the history of present illness.  All other systems reviewed and negative.   PHYSICAL EXAM: VS:  BP 142/74 mmHg  Pulse 62  Ht 5\' 3"  (1.6 m)  Wt 128 lb 3.2 oz (58.151 kg)  BMI 22.72 kg/m2 Well nourished, well developed, in no acute distress HEENT: Pupils are equal round react to light accommodation extraocular movements are intact.  Neck: no JVDNo cervical lymphadenopathy. Cardiac: Regular rate and rhythm without murmurs rubs or gallops. Lungs:  clear to auscultation bilaterally, no wheezing, rhonchi or rales Abd: soft, nontender, positive bowel sounds all quadrants, no hepatosplenomegaly Ext: no lower extremity edema.  2+ radial and dorsalis pedis pulses. Skin: warm  and dry Neuro:  Grossly normal   ASSESSMENT AND PLAN:  Hyperlipidemia: Continue statin  Essential hypertension Blood pressure mildly elevated. Continue current medications.  Hypertensive cardiomyopathy Patient appears euvolemic. No orthopnea or lower extremity edema.  Paroxysmal atrial fibrillation Not an anticoagulation candidate due to previous intracranial hemorrhage. Her rate is regular and well-controlled

## 2015-04-01 NOTE — Patient Instructions (Signed)
Your physician wants you to follow-up in: 6 months with Dr. Hochrein.  You will receive a reminder letter in the mail two months in advance. If you don't receive a letter, please call our office to schedule the follow-up appointment.  

## 2015-04-02 ENCOUNTER — Inpatient Hospital Stay: Payer: Medicare Other | Admitting: Family

## 2015-04-02 ENCOUNTER — Encounter: Payer: Medicare Other | Admitting: Internal Medicine

## 2015-04-02 DIAGNOSIS — Z0289 Encounter for other administrative examinations: Secondary | ICD-10-CM

## 2015-04-08 ENCOUNTER — Encounter: Payer: Self-pay | Admitting: Family

## 2015-04-08 ENCOUNTER — Ambulatory Visit (INDEPENDENT_AMBULATORY_CARE_PROVIDER_SITE_OTHER): Payer: Medicare Other | Admitting: Family

## 2015-04-08 ENCOUNTER — Other Ambulatory Visit (INDEPENDENT_AMBULATORY_CARE_PROVIDER_SITE_OTHER): Payer: Medicare Other

## 2015-04-08 VITALS — BP 164/94 | HR 75 | Temp 97.8°F | Resp 18 | Ht 63.0 in | Wt 127.0 lb

## 2015-04-08 DIAGNOSIS — M7989 Other specified soft tissue disorders: Secondary | ICD-10-CM

## 2015-04-08 DIAGNOSIS — M79606 Pain in leg, unspecified: Secondary | ICD-10-CM

## 2015-04-08 DIAGNOSIS — I10 Essential (primary) hypertension: Secondary | ICD-10-CM

## 2015-04-08 LAB — COMPREHENSIVE METABOLIC PANEL
ALBUMIN: 4.1 g/dL (ref 3.5–5.2)
ALK PHOS: 56 U/L (ref 39–117)
ALT: 11 U/L (ref 0–35)
AST: 18 U/L (ref 0–37)
BILIRUBIN TOTAL: 0.4 mg/dL (ref 0.2–1.2)
BUN: 40 mg/dL — AB (ref 6–23)
CO2: 25 mEq/L (ref 19–32)
CREATININE: 1.93 mg/dL — AB (ref 0.40–1.20)
Calcium: 9.8 mg/dL (ref 8.4–10.5)
Chloride: 109 mEq/L (ref 96–112)
GFR: 31.71 mL/min — ABNORMAL LOW (ref >60.00)
Glucose, Bld: 119 mg/dL — ABNORMAL HIGH (ref 70–99)
Potassium: 4.4 mEq/L (ref 3.5–5.1)
SODIUM: 141 meq/L (ref 135–145)
TOTAL PROTEIN: 8.2 g/dL (ref 6.0–8.3)

## 2015-04-08 LAB — CBC
HEMATOCRIT: 37.2 % (ref 36.0–46.0)
Hemoglobin: 12.4 g/dL (ref 12.0–15.0)
MCHC: 33.3 g/dL (ref 30.0–36.0)
MCV: 84.9 fl (ref 78.0–100.0)
Platelets: 179 10*3/uL (ref 150.0–400.0)
RBC: 4.38 Mil/uL (ref 3.87–5.11)
RDW: 14.3 % (ref 11.5–15.5)
WBC: 7.2 10*3/uL (ref 4.0–10.5)

## 2015-04-08 LAB — MAGNESIUM: Magnesium: 2.2 mg/dL (ref 1.5–2.5)

## 2015-04-08 NOTE — Patient Instructions (Signed)
Thank you for choosing [facility].  Summary/Instructions:  Please continue to take your medications as prescribed.  Please continue to monitor your blood pressure at home.  Please stop by the lab on the basement level of the building for your blood work. Your results will be released to [place] (or called to you) after review, usually within 72 hours after test completion. If any changes need to be made, you will be notified at that same time.  If your symptoms worsen or fail to improve, please contact our office for further instruction, or in case of emergency go directly to the emergency room at the closest medical facility.

## 2015-04-08 NOTE — Assessment & Plan Note (Signed)
Appears resolved since leaving the hospital with occasional lower extremity edema most likely related to amlodipine. Obtain CMET and magnesium to rule out deficiency. Follow up if symptoms return.

## 2015-04-08 NOTE — Assessment & Plan Note (Addendum)
Nodule of right lower quadrant most likely benign and could be a lymph node. Continue to monitor at this time. Follow up for any growth or pain that may develop. Obtain CBC to rule out infection.

## 2015-04-08 NOTE — Progress Notes (Signed)
Subjective:    Patient ID: [name], female    DOB: 1928/06/25, 81 y.o.   MRN: [id number]  Chief Complaint  Patient presents with  . Hospitalization Follow-up    was told to have a follow up CBC, CMP, and 2 view chest xray, states she has been feeling alright since she has been out of the hospital, just states she found a knot in her abdomen that she has never felt before since leaving the hopsital    HPI:  [name] is a 81 y.o. female with a PMH of TIA, renal insufficiency, neuropathy, GERD, hypercholesterolemia, coronary atherosclerosis, AV malformation and paroxysmal atrial fibrillation not on coagulation who presents today for an office visit following hospitalization.     1.) Recent hospitalization - Recently seen in the ED and admitted to the hospital with intermittent left-sided chest pain that waxed and waned. She had shortness of breath worsened with exertion. She was also noted to have "cramping" in her lower extremity. Labs showed worsening CKD and troponins were negative. Concern was for angina. Following work up chest pain was deemed atypical and non-ACS. VQ scan was normal and was not anticoagulated secondary to previous history of GI bleed and intracranial hemorrhage. Her lower extremity pain was deemed muscle cramps. She was discharged with follow up with cardiology. All hospital records were reviewed in detail.   Overall reports she is doing well since leaving the hospital. Only notes the occasional feet swelling. Continues to take her medications as prescribed. Eating well and reports no problems with acitivities of daily. Denies any chest pain or leg cramps. Notes some discomfort in her chest with overexertion that is releaved by rest. Notes that the leg pain/cramps have also improved since leaving hospital. Not currently experiencing either chest pain or leg pain.   2.) Knot - Associated symptom of a knot located in her right lower abdomen has been going  on for the past 3-4 days. Denies pain or tenderness. Described as a "hard knot/lump".  Denies modifying factors that make it better or worse. There has been no treatment attempted. Feels about the same since she initially noted it.   Allergies  Allergen Reactions  . Lisinopril     REACTION: INTOL to ACE's w/ cough  . Penicillins     REACTION: rash  . Statins     Muscle Aches    Current Outpatient Prescriptions on File Prior to Visit  Medication Sig Dispense Refill  . acetaminophen (TYLENOL) 650 MG CR tablet Take 1,300 mg by mouth every 8 (eight) hours as needed for pain.    Marland Kitchen amLODipine (NORVASC) 10 MG tablet Take 1 tablet (10 mg total) by mouth daily. (Patient taking differently: Take 10 mg by mouth at bedtime. ) 90 tablet 3  . aspirin EC 81 MG tablet Take 1 tablet (81 mg total) by mouth daily. (Patient taking differently: Take 81 mg by mouth at bedtime. ) 150 tablet 2  . HYDROcodone-acetaminophen (NORCO/VICODIN) 5-325 MG per tablet Take 1-2 tablets by mouth every 4 (four) hours as needed. 15 tablet 0  . losartan (COZAAR) 100 MG tablet Take 1 tablet (100 mg total) by mouth daily. (Patient taking differently: Take 100 mg by mouth at bedtime. ) 90 tablet 3  . methocarbamol (ROBAXIN) 500 MG tablet Take 1 tablet (500 mg total) by mouth every 8 (eight) hours as needed for muscle spasms. 20 tablet 0  . metoprolol succinate (TOPROL-XL) 50 MG 24 hr tablet Take 1 tablet (50 mg  total) by mouth daily. Take with or immediately following a meal. 90 tablet 3  . pioglitazone (ACTOS) 45 MG tablet Take 1 tablet (45 mg total) by mouth daily. (Patient taking differently: Take 45 mg by mouth at bedtime. ) 30 tablet 11  . repaglinide (PRANDIN) 2 MG tablet Take 1 tablet (2 mg total) by mouth 3 (three) times daily before meals. 90 tablet 11  . rosuvastatin (CRESTOR) 5 MG tablet Take 1 tablet (5 mg total) by mouth daily at 6 PM. 30 tablet 0   No current facility-administered medications on file prior to visit.    Past Medical History  Diagnosis Date  . Pulmonary embolism 02/2009    on anticoag thru 11/2010 - stopped due to risk>benefit  . Hypertension   . CAD (coronary artery disease)     nonobst on cath 2010  . Cardiomyopathy     hypertensive  . Paroxysmal atrial fibrillation     not anticoag candidate  . Hypercholesterolemia   . Diabetes mellitus     hx noncompliance with rx'd tx  . Nontoxic multinodular goiter   . Pure hypercholesterolemia   . GERD (gastroesophageal reflux disease)     hx DU with H pylori 01/2009 and GIB, s/p triple rx  . Benign neoplasm of adrenal gland   . Diverticulosis of colon (without mention of hemorrhage)   . Calculus of kidney   . Anxiety   . H. pylori infection   . DJD (degenerative joint disease)   . Hx of subarachnoid hemorrhage 2001  . GI bleed 2012    duoedonal ulcer     Review of Systems  Constitutional: Negative for fever and chills.  Respiratory: Negative for chest tightness and shortness of breath.   Cardiovascular: Negative for chest pain, palpitations and leg swelling.  Gastrointestinal: Negative for abdominal pain.  Neurological: Negative for headaches.      Objective:    BP 164/94 mmHg  Pulse 75  Temp(Src) 97.8 F (36.6 C) (Oral)  Resp 18  Ht 5\' 3"  (1.6 m)  Wt 127 lb (57.607 kg)  BMI 22.50 kg/m2  SpO2 97% Nursing note and vital signs reviewed.  Physical Exam  Constitutional: She is oriented to person, place, and time. She appears well-developed and well-nourished. No distress.  Cardiovascular: Normal rate, regular rhythm, normal heart sounds and intact distal pulses.   Pulmonary/Chest: Effort normal and breath sounds normal.  Abdominal:  Right lower quadrant with no obvious deformity or discoloration. Small pea sized firm, mobile, non-tender nodule located in the lower right quadrant.   Neurological: She is alert and oriented to person, place, and time.  Skin: Skin is warm and dry.  Psychiatric: She has a normal mood and  affect. Her behavior is normal. Judgment and thought content normal.       Assessment & Plan:   Problem List Items Addressed This Visit      Cardiovascular and Mediastinum   Essential hypertension - Primary    Continues to experience labile blood pressure. Continue current dosage of amlodipine, metoprolol and losartan. Continue to monitor blood pressure at home. Obtain CMET.       Relevant Orders   CBC (Completed)   Comprehensive metabolic panel (Completed)   Magnesium (Completed)     Other   Leg pain    Appears resolved since leaving the hospital with occasional lower extremity edema most likely related to amlodipine. Obtain CMET and magnesium to rule out deficiency. Follow up if symptoms return.  Nodule of soft tissue    Nodule of right lower quadrant most likely benign and could be a lymph node. Continue to monitor at this time. Follow up for any growth or pain that may develop. Obtain CBC to rule out infection.

## 2015-04-08 NOTE — Assessment & Plan Note (Signed)
Continues to experience labile blood pressure. Continue current dosage of amlodipine, metoprolol and losartan. Continue to monitor blood pressure at home. Obtain CMET.

## 2015-04-08 NOTE — Progress Notes (Signed)
Pre visit review using our clinic review tool, if applicable. No additional management support is needed unless otherwise documented below in the visit note. 

## 2015-04-17 ENCOUNTER — Telehealth: Payer: Self-pay | Admitting: Cardiology

## 2015-04-17 NOTE — Telephone Encounter (Signed)
Patient said that she can't take the Crestor because it causes pain in her right bicep.  The other statin she tried caused pain in her left bicep.  Wants to know if there is something else she can take besides the statins and something not so expensive

## 2015-04-17 NOTE — Telephone Encounter (Signed)
New message      Pt c/o medication issue:  1. Name of Medication: generic crestor 2. How are you currently taking this medication (dosage and times per day)? 5mg  daily 3. Are you having a reaction (difficulty breathing--STAT)? no  4. What is your medication issue? Medication is making her muscle ache and she cannot take the medication.

## 2015-04-18 NOTE — Telephone Encounter (Signed)
She can stop the statins and start Zetia 10 mg po daily disp number 90 with 3 refills.

## 2015-04-19 ENCOUNTER — Telehealth: Payer: Self-pay | Admitting: Cardiology

## 2015-04-19 MED ORDER — EZETIMIBE 10 MG PO TABS: 10.0000 mg | ORAL_TABLET | Freq: Every day | ORAL | Status: DC

## 2015-04-19 NOTE — Telephone Encounter (Signed)
Patient is agreeable to try Zetia 10 mg daily as long as it is not too expensive

## 2015-04-19 NOTE — Telephone Encounter (Signed)
Ezitimibe is Zetia.   I don't think the others are indicated.

## 2015-04-19 NOTE — Telephone Encounter (Signed)
Please call,Zetia is too expensive.

## 2015-04-19 NOTE — Telephone Encounter (Signed)
Patient called and the Zetia is to expensive.  Her pharmacist recommends one of these that work similar    Lopid Gemfibrozil Ezetimibe   Will route to Dr Percival Spanish and Tommy Medal for advice

## 2015-04-23 NOTE — Telephone Encounter (Signed)
Zetia (Ezetimibe) is not available in generic form yet. Has medicare part D and probably would not qualify for a program to reduce the cost of $118 per month.  Too expensive for her budget  Is there another medication available that will fit her be effective at a lower cost?

## 2015-04-23 NOTE — Telephone Encounter (Signed)
LVMTCB

## 2015-04-23 NOTE — Telephone Encounter (Signed)
Returning your call. °

## 2015-04-23 NOTE — Telephone Encounter (Signed)
Zetia is due to go generic in October.  She is apparently statin intolerant, so no other options.  If she still has Zetia, try taking just 1/2 tab per day, or wait until next month and see if the price drops.

## 2015-04-24 NOTE — Telephone Encounter (Signed)
Patient has decided that she will wait till next month to decide on starting generic Zetia to see if price drops enough to use

## 2015-05-17 ENCOUNTER — Other Ambulatory Visit: Payer: Self-pay | Admitting: *Deleted

## 2015-05-17 ENCOUNTER — Telehealth: Payer: Self-pay | Admitting: Cardiology

## 2015-05-17 ENCOUNTER — Telehealth: Payer: Self-pay | Admitting: *Deleted

## 2015-05-17 DIAGNOSIS — Z79899 Other long term (current) drug therapy: Secondary | ICD-10-CM

## 2015-05-17 MED ORDER — FUROSEMIDE 20 MG PO TABS: 20.0000 mg | ORAL_TABLET | Freq: Every day | ORAL | Status: DC

## 2015-05-17 NOTE — Telephone Encounter (Signed)
She does have a low EF and CKD which could cause swelling. I would recommend lasix 20 mg daily. Check a BMET next week. If swelling does not improve will need to be worked in for visit.  Peter Martinique MD, [facility]

## 2015-05-17 NOTE — Telephone Encounter (Signed)
Incoming call from [facility] HF case manager.  Reported pt has had weight gain of 6.1 lbs w/in past 3 days. Pt c/o amlodipine causing swelling, however case manager notes she has taken this dose since May w/ no problems.  Symptomatic w/ swelling in ankles, cough, mild SOB.  Advised if SOB or swelling become more severe, go to ED.  I note patient does not have a current furosemide or torsemide Rx.   Routing to Dr. Martinique ([facility]D) to advise.

## 2015-05-17 NOTE — Telephone Encounter (Signed)
BMET order created, med called in per Dr. Doug Sou instructions (see closed note dated 05/17/15)

## 2015-06-17 ENCOUNTER — Telehealth: Payer: Self-pay | Admitting: Cardiology

## 2015-06-17 NOTE — Telephone Encounter (Signed)
Called patient and got narrative.  She reports palpitative discomfort, "feeling heart racing". She reports this is occasional during the week - usually 2 to 3 times weekly and of a brief duration, typically no more than 5 minutes. This has been occurring for past ~2-3 weeks.  Happens at rest and at exertion. She is not short of breath w/ this.  She did not have BPs or HRs to report.  Informed patient I would arrange flex provider visit.   Pt states OK to leave message at home w/ instruction for appt time/provider/location if she does not answer.  Called to [place] for Thomes Dinning requesting pt addition to flex schedule.

## 2015-06-17 NOTE — Telephone Encounter (Signed)
Caryl Pina called in stating that 2 to 3 times a wk the pt is experiencing chest pain and heart racing for about 3-5 mins. She says that sometimes it is with exertion and sometimes its not. Pt is apart of the CHF program. Please f/u with pt   Thanks

## 2015-06-18 NOTE — Telephone Encounter (Signed)
Returned call to patient no answer.Left message on personal voice mail appointment scheduled with Tarri Fuller PA 06/20/15 at 10:30 am at [facility] office.

## 2015-06-20 ENCOUNTER — Ambulatory Visit (INDEPENDENT_AMBULATORY_CARE_PROVIDER_SITE_OTHER): Payer: Medicare Other | Admitting: Physician Assistant

## 2015-06-20 ENCOUNTER — Encounter: Payer: Self-pay | Admitting: Physician Assistant

## 2015-06-20 VITALS — BP 174/102 | HR 84 | Ht 63.0 in | Wt 131.4 lb

## 2015-06-20 DIAGNOSIS — R002 Palpitations: Secondary | ICD-10-CM | POA: Diagnosis not present

## 2015-06-20 DIAGNOSIS — I10 Essential (primary) hypertension: Secondary | ICD-10-CM

## 2015-06-20 DIAGNOSIS — I119 Hypertensive heart disease without heart failure: Secondary | ICD-10-CM

## 2015-06-20 DIAGNOSIS — I480 Paroxysmal atrial fibrillation: Secondary | ICD-10-CM

## 2015-06-20 DIAGNOSIS — E7800 Pure hypercholesterolemia, unspecified: Secondary | ICD-10-CM

## 2015-06-20 DIAGNOSIS — I43 Cardiomyopathy in diseases classified elsewhere: Secondary | ICD-10-CM

## 2015-06-20 DIAGNOSIS — R079 Chest pain, unspecified: Secondary | ICD-10-CM

## 2015-06-20 NOTE — Patient Instructions (Addendum)
Medication Instructions: Your physician recommends that you continue on your current medications as directed. Please refer to the Current Medication list given to you today.     If you need a refill on your cardiac medications before your next appointment, please call your pharmacy.  Labwork: NONE ORDER TODAY    Testing/Procedures: Your physician has requested that you have a lexiscan myoview. For further information please visit [email or web address]. Please follow instruction sheet, as given.  Your physician has recommended that you wear a holter monitor. Holter monitors are medical devices that record the heart's electrical activity. Doctors most often use these monitors to diagnose arrhythmias. Arrhythmias are problems with the speed or rhythm of the heartbeat. The monitor is a small, portable device. You can wear one while you do your normal daily activities. This is usually used to diagnose what is causing palpitations/syncope (passing out).    Follow-Up:  IN 2 WEEKS WITH AN AVAILABLE APP OR DR [place]       Any Other Special Instructions Will Be Listed Below (If Applicable).

## 2015-06-20 NOTE — Progress Notes (Signed)
Patient ID: [name], female   DOB: Dec 13, 1927, 81 y.o.   MRN: [id number]    Date:  06/20/2015   ID:  [name], DOB 07/26/1928, MRN [id number]  PCP:  Gwendolyn Grant, MD   Primary Cardiologist:  Hochrein   Chief complaint: rapid heartbeat and chest pain   History of Present Illness: [name] is a 81 y.o. female  with hx of HTN, nonobstructive cath 2010 ( scattered disease of LAD with max stenosis in range of 50% after the diagonal takeoff), and last Nuc study 2013 was negative for ischemia. She has moderately reduced EF, Dr. Percival Spanish suspected hypertensive cardiomyopathy with last echo 12/2013 with EF 30-35% and G2DD, the findings indicate significant global left ventricular wall dyssynchrony. She also has hx of stable chest pain,  PAF but not an anticoag candidate(history of intracranial hemorrhage), left bundle branch block,  anxiety. . Her last echocardiogram was 03/23/2015 and the ejection fraction was 35-40% with moderate diffuse hypokinesis. She has grade 2 diastolic dysfunction. Moderate mitral valve regurgitation. The left atrium severely dilated. There is a medium-sized atrial septal aneurysm..  she was seen for posthospital follow-up  Back in August.. She was admitted from August 5 to 03/24/2015 with atypical chest pain. Was thought to be musculoskeletal. Troponin was minimally elevated and a non-ACS pattern.. She is not on spironolactone or ACE inhibitor because of renal insufficiency.   when I saw her back in August she felt quite well and had no particular complaints other than some dyspnea with exertion.    She presents today with complaints of spells in which her heart beats very fast and she feels "a wave ache"  Crossed her chest which can be quite severe. It seems to happen at rest or with exertion but exertion is more severe.  It is associated with shortness of breath. Usually last 3-5 minutes. There is no dizziness.  The patient  currently denies nausea, vomiting, fever, orthopnea, dizziness, PND, cough, congestion, abdominal pain, hematochezia, melena, lower extremity edema, claudication.  Wt Readings from Last 3 Encounters:  06/20/15 131 lb 6.4 oz (59.603 kg)  04/08/15 127 lb (57.607 kg)  04/01/15 128 lb 3.2 oz (58.151 kg)     Past Medical History  Diagnosis Date  . Pulmonary embolism ([place]) 02/2009    on anticoag thru 11/2010 - stopped due to risk>benefit  . Hypertension   . CAD (coronary artery disease)     nonobst on cath 2010  . Cardiomyopathy     hypertensive  . Paroxysmal atrial fibrillation (HCC)     not anticoag candidate  . Hypercholesterolemia   . Diabetes mellitus     hx noncompliance with rx'd tx  . Nontoxic multinodular goiter   . Pure hypercholesterolemia   . GERD (gastroesophageal reflux disease)     hx DU with H pylori 01/2009 and GIB, s/p triple rx  . Benign neoplasm of adrenal gland   . Diverticulosis of colon (without mention of hemorrhage)   . Calculus of kidney   . Anxiety   . H. pylori infection   . DJD (degenerative joint disease)   . Hx of subarachnoid hemorrhage 2001  . GI bleed 2012    duoedonal ulcer    Current Outpatient Prescriptions  Medication Sig Dispense Refill  . acetaminophen (TYLENOL) 650 MG CR tablet Take 1,300 mg by mouth every 8 (eight) hours as needed for pain.    Marland Kitchen amLODipine (NORVASC) 10 MG tablet Take 10 mg by mouth at bedtime.    Marland Kitchen  aspirin 81 MG tablet Take 81 mg by mouth at bedtime.    Marland Kitchen ezetimibe (ZETIA) 10 MG tablet Take 1 tablet (10 mg total) by mouth daily. 90 tablet 3  . furosemide (LASIX) 20 MG tablet Take 1 tablet (20 mg total) by mouth daily. 30 tablet 3  . losartan (COZAAR) 100 MG tablet Take 100 mg by mouth at bedtime.    . methocarbamol (ROBAXIN) 500 MG tablet Take 1 tablet (500 mg total) by mouth every 8 (eight) hours as needed for muscle spasms. 20 tablet 0  . metoprolol succinate (TOPROL-XL) 50 MG 24 hr tablet Take 1 tablet (50 mg total) by  mouth daily. Take with or immediately following a meal. 90 tablet 3  . pioglitazone (ACTOS) 45 MG tablet Take 45 mg by mouth at bedtime.    . repaglinide (PRANDIN) 2 MG tablet Take 1 tablet (2 mg total) by mouth 3 (three) times daily before meals. 90 tablet 11   No current facility-administered medications for this visit.    Allergies:    Allergies  Allergen Reactions  . Lisinopril     REACTION: INTOL to ACE's w/ cough  . Penicillins     REACTION: rash  . Statins     Muscle Aches    Social History:  The patient  reports that she quit smoking about 31 years ago. Her smoking use included Cigarettes. She has a 10.8 pack-year smoking history. She has never used smokeless tobacco. She reports that she drinks about 0.6 oz of alcohol per week. She reports that she does not use illicit drugs.   Family history:   Family History  Problem Relation Age of Onset  . Diabetes Maternal Grandmother   . Heart disease Maternal Aunt     x2  . Lung cancer Mother   . Brain cancer Mother   . Goiter Mother   . Cancer Mother   . Heart disease Mother   . Diabetes Mother   . Colon cancer Neg Hx     ROS:  Please see the history of present illness.  All other systems reviewed and negative.   PHYSICAL EXAM: VS:  BP 174/102 mmHg  Pulse 84  Ht 5\' 3"  (1.6 m)  Wt 131 lb 6.4 oz (59.603 kg)  BMI 23.28 kg/m2 Well nourished, well developed, in no acute distress HEENT: Pupils are equal round react to light accommodation extraocular movements are intact.  Neck: no JVDNo cervical lymphadenopathy. Cardiac: Regular rate and rhythm without murmurs rubs or gallops. Lungs:  clear to auscultation bilaterally, no wheezing, rhonchi or rales Abd: soft, nontender, positive bowel sounds all quadrants, no hepatosplenomegaly Ext: no lower extremity edema.  2+ radial and dorsalis pedis pulses. Skin: warm and dry Neuro:  Grossly normal  EKG:   Sinus rhythm left bundle branch block left axis deviation  ASSESSMENT AND  PLAN:  Problem List Items Addressed This Visit    PAROXYSMAL ATRIAL FIBRILLATION   Relevant Medications   amLODipine (NORVASC) 10 MG tablet   aspirin 81 MG tablet   losartan (COZAAR) 100 MG tablet   Hypertensive cardiomyopathy (HCC)   Relevant Medications   amLODipine (NORVASC) 10 MG tablet   aspirin 81 MG tablet   losartan (COZAAR) 100 MG tablet   HYPERCHOLESTEROLEMIA   Relevant Medications   amLODipine (NORVASC) 10 MG tablet   aspirin 81 MG tablet   losartan (COZAAR) 100 MG tablet   Essential hypertension   Relevant Medications   amLODipine (NORVASC) 10 MG tablet   aspirin 81  MG tablet   losartan (COZAAR) 100 MG tablet   Chest pain    Other Visit Diagnoses    Heart palpitations    -  Primary    Relevant Orders    EKG 12-Lead    Holter monitor - 48 hour    Myocardial Perfusion Imaging       chest pain:   Stable to say whether the patient's aching feeling in her chest is related to her heart. Back in August when she was having atypical chest pain she was noted to have a mildly elevated troponin. This seems to be a little bit deeper different presentation. We'll arrange for a Lexiscan stress test.  She is on aspirin, beta blocker.  Could be anxiety.   Elevated heart rate /palpitations    She does have a history of PAF.   Will arrange for 48 hour Holter monitor.   hyperlipidemia : Continue Zetia   essential hypertension :    Continue current medications. Patient states that the stress of driving to the office elevates her blood pressure.   hypertensive cardiomyopathy, EF 30-40% on echo August 2016  Patient appears euvolemic. Continue Lasix 20 milligrams daily

## 2015-06-24 ENCOUNTER — Ambulatory Visit (INDEPENDENT_AMBULATORY_CARE_PROVIDER_SITE_OTHER): Payer: Medicare Other

## 2015-06-24 DIAGNOSIS — R002 Palpitations: Secondary | ICD-10-CM

## 2015-06-26 ENCOUNTER — Encounter: Payer: Self-pay | Admitting: Endocrinology

## 2015-06-26 ENCOUNTER — Telehealth (HOSPITAL_COMMUNITY): Payer: Self-pay

## 2015-06-26 ENCOUNTER — Ambulatory Visit (INDEPENDENT_AMBULATORY_CARE_PROVIDER_SITE_OTHER): Payer: Medicare Other | Admitting: Endocrinology

## 2015-06-26 VITALS — BP 144/82 | HR 72 | Temp 98.1°F | Ht 63.0 in | Wt 135.0 lb

## 2015-06-26 DIAGNOSIS — E119 Type 2 diabetes mellitus without complications: Secondary | ICD-10-CM | POA: Diagnosis not present

## 2015-06-26 LAB — POCT GLYCOSYLATED HEMOGLOBIN (HGB A1C): Hemoglobin A1C: 7.5

## 2015-06-26 NOTE — Telephone Encounter (Signed)
Left message on voicemail in reference to upcoming appointment scheduled for 07-02-2015. Phone number given for a call back so details instructions can be given. Oletta Lamas, Robyne Matar A

## 2015-06-26 NOTE — Progress Notes (Signed)
Subjective:    Patient ID: [name], female    DOB: 19-Apr-1928, 81 y.o.   MRN: [id number]  HPI Pt returns for f/u of diabetes mellitus: DM type: 2 Dx'ed: 6948 Complications: polyneuropathy, renal insufficiency, TIA, and CAD.   Therapy: 2 oral meds.  GDM: never.   DKA: never Severe hypoglycemia: never.   Pancreatitis: never. Other: she took insulin 2014-2015; oral rx options are limited by renal insufficiency and her request for generic meds.    Interval history: no cbg record, but states cbg's are well-controlled.  pt states she feels well in general.  She takes meds as rx'ed.   Past Medical History  Diagnosis Date  . Pulmonary embolism ([place]) 02/2009    on anticoag thru 11/2010 - stopped due to risk>benefit  . Hypertension   . CAD (coronary artery disease)     nonobst on cath 2010  . Cardiomyopathy     hypertensive  . Paroxysmal atrial fibrillation (HCC)     not anticoag candidate  . Hypercholesterolemia   . Diabetes mellitus     hx noncompliance with rx'd tx  . Nontoxic multinodular goiter   . Pure hypercholesterolemia   . GERD (gastroesophageal reflux disease)     hx DU with H pylori 01/2009 and GIB, s/p triple rx  . Benign neoplasm of adrenal gland   . Diverticulosis of colon (without mention of hemorrhage)   . Calculus of kidney   . Anxiety   . H. pylori infection   . DJD (degenerative joint disease)   . Hx of subarachnoid hemorrhage 2001  . GI bleed 2012    duoedonal ulcer    Past Surgical History  Procedure Laterality Date  . Knee surgery      left  . Shoulder surgery      right  . Cataract extraction      Social History   Social History  . Marital Status: Widowed    Spouse Name: N/A  . Number of Children: 1  . Years of Education: N/A   Occupational History  . retired    Social History Main Topics  . Smoking status: Former Smoker -- 0.30 packs/day for 36 years    Types: Cigarettes    Quit date: 08/18/1983  . Smokeless tobacco: Never  Used  . Alcohol Use: 0.6 oz/week    1 Glasses of wine per week     Comment: occ wine, beer during holidays or at social events  . Drug Use: No  . Sexual Activity: Not Currently   Other Topics Concern  . Not on file   Social History Narrative    Current Outpatient Prescriptions on File Prior to Visit  Medication Sig Dispense Refill  . acetaminophen (TYLENOL) 650 MG CR tablet Take 1,300 mg by mouth every 8 (eight) hours as needed for pain.    Marland Kitchen amLODipine (NORVASC) 10 MG tablet Take 10 mg by mouth at bedtime.    Marland Kitchen aspirin 81 MG tablet Take 81 mg by mouth at bedtime.    Marland Kitchen ezetimibe (ZETIA) 10 MG tablet Take 1 tablet (10 mg total) by mouth daily. 90 tablet 3  . furosemide (LASIX) 20 MG tablet Take 1 tablet (20 mg total) by mouth daily. 30 tablet 3  . losartan (COZAAR) 100 MG tablet Take 100 mg by mouth at bedtime.    . methocarbamol (ROBAXIN) 500 MG tablet Take 1 tablet (500 mg total) by mouth every 8 (eight) hours as needed for muscle spasms. 20 tablet 0  .  metoprolol succinate (TOPROL-XL) 50 MG 24 hr tablet Take 1 tablet (50 mg total) by mouth daily. Take with or immediately following a meal. 90 tablet 3  . pioglitazone (ACTOS) 45 MG tablet Take 45 mg by mouth at bedtime.    . repaglinide (PRANDIN) 2 MG tablet Take 1 tablet (2 mg total) by mouth 3 (three) times daily before meals. 90 tablet 11   No current facility-administered medications on file prior to visit.    Allergies  Allergen Reactions  . Lisinopril     REACTION: INTOL to ACE's w/ cough  . Penicillins     REACTION: rash  . Statins     Muscle Aches    Family History  Problem Relation Age of Onset  . Diabetes Maternal Grandmother   . Heart disease Maternal Aunt     x2  . Lung cancer Mother   . Brain cancer Mother   . Goiter Mother   . Cancer Mother   . Heart disease Mother   . Diabetes Mother   . Colon cancer Neg Hx     BP 144/82 mmHg  Pulse 72  Temp(Src) 98.1 F (36.7 C) (Oral)  Ht 5\' 3"  (1.6 m)  Wt  135 lb (61.236 kg)  BMI 23.92 kg/m2  SpO2 99%  Review of Systems She denies hypoglycemia.      Objective:   Physical Exam VITAL SIGNS:  See vs page GENERAL: no distress Pulses: dorsalis pedis intact bilat.   MSK: no deformity of the feet CV: no leg edema.  Skin:  no ulcer on the feet.  normal color and temp on the feet. Neuro: sensation is intact to touch on the feet.   Ext: There is bilateral onychomycosis of the toenails.   Lab Results  Component Value Date   HGBA1C 7.5 06/26/2015      Assessment & Plan:  DM: well-controlled, in view of her advanced age.  Patient is advised the following: Patient Instructions  check your blood sugar once a day.  vary the time of day when you check, between before the 3 meals, and at bedtime.  also check if you have symptoms of your blood sugar being too high or too low.  please keep a record of the readings and bring it to your next appointment here.  You can write it on any piece of paper.  please call us sooner if your blood sugar goes below 70, or if you have a lot of readings over 200.  Please continue the same diabetes pills.  Please come back for a follow-up appointment in 3 months.

## 2015-06-26 NOTE — Patient Instructions (Addendum)
check your blood sugar once a day.  vary the time of day when you check, between before the 3 meals, and at bedtime.  also check if you have symptoms of your blood sugar being too high or too low.  please keep a record of the readings and bring it to your next appointment here.  You can write it on any piece of paper.  please call us sooner if your blood sugar goes below 70, or if you have a lot of readings over 200.  Please continue the same diabetes pills.  Please come back for a follow-up appointment in 3 months.

## 2015-07-02 ENCOUNTER — Ambulatory Visit (HOSPITAL_COMMUNITY): Payer: Medicare Other | Attending: Cardiology

## 2015-07-02 DIAGNOSIS — R0609 Other forms of dyspnea: Secondary | ICD-10-CM | POA: Diagnosis not present

## 2015-07-02 DIAGNOSIS — I517 Cardiomegaly: Secondary | ICD-10-CM | POA: Insufficient documentation

## 2015-07-02 DIAGNOSIS — R079 Chest pain, unspecified: Secondary | ICD-10-CM | POA: Insufficient documentation

## 2015-07-02 DIAGNOSIS — E119 Type 2 diabetes mellitus without complications: Secondary | ICD-10-CM | POA: Diagnosis not present

## 2015-07-02 DIAGNOSIS — I10 Essential (primary) hypertension: Secondary | ICD-10-CM | POA: Diagnosis not present

## 2015-07-02 DIAGNOSIS — I447 Left bundle-branch block, unspecified: Secondary | ICD-10-CM | POA: Diagnosis not present

## 2015-07-02 DIAGNOSIS — R002 Palpitations: Secondary | ICD-10-CM | POA: Insufficient documentation

## 2015-07-02 DIAGNOSIS — R0602 Shortness of breath: Secondary | ICD-10-CM | POA: Insufficient documentation

## 2015-07-02 DIAGNOSIS — I2510 Atherosclerotic heart disease of native coronary artery without angina pectoris: Secondary | ICD-10-CM | POA: Diagnosis not present

## 2015-07-02 LAB — MYOCARDIAL PERFUSION IMAGING
CHL CUP NUCLEAR SDS: 7
CHL CUP NUCLEAR SRS: 1
CHL CUP NUCLEAR SSS: 7
LHR: 0.27
LV sys vol: 130 mL
LVDIAVOL: 171 mL
NUC STRESS TID: 1.07
Peak HR: 103 {beats}/min
Rest HR: 80 {beats}/min

## 2015-07-02 MED ORDER — TECHNETIUM TC 99M SESTAMIBI GENERIC - CARDIOLITE
32.7000 | Freq: Once | INTRAVENOUS | Status: AC | PRN
  Administered 2015-07-02: 32.7 via INTRAVENOUS

## 2015-07-02 MED ORDER — REGADENOSON 0.4 MG/5ML IV SOLN
0.4000 mg | Freq: Once | INTRAVENOUS | Status: AC
  Administered 2015-07-02: 0.4 mg via INTRAVENOUS

## 2015-07-02 MED ORDER — TECHNETIUM TC 99M SESTAMIBI GENERIC - CARDIOLITE
10.9000 | Freq: Once | INTRAVENOUS | Status: AC | PRN
  Administered 2015-07-02: 10.9 via INTRAVENOUS

## 2015-07-02 MED ORDER — AMINOPHYLLINE 25 MG/ML IV SOLN
75.0000 mg | Freq: Once | INTRAVENOUS | Status: AC
  Administered 2015-07-02: 75 mg via INTRAVENOUS

## 2015-07-04 NOTE — Progress Notes (Signed)
I would like to see her back to discuss the results.

## 2015-07-08 ENCOUNTER — Encounter: Payer: Self-pay | Admitting: Cardiology

## 2015-07-08 ENCOUNTER — Ambulatory Visit (INDEPENDENT_AMBULATORY_CARE_PROVIDER_SITE_OTHER): Payer: Medicare Other | Admitting: Cardiology

## 2015-07-08 VITALS — BP 140/92 | HR 78 | Ht 62.5 in | Wt 136.4 lb

## 2015-07-08 DIAGNOSIS — R072 Precordial pain: Secondary | ICD-10-CM

## 2015-07-08 NOTE — Patient Instructions (Signed)
Your physician wants you to follow-up in: 6 Months You will receive a reminder letter in the mail two months in advance. If you don't receive a letter, please call our office to schedule the follow-up appointment.  

## 2015-07-08 NOTE — Progress Notes (Signed)
HPI The patient presents for evaluation of chest discomfort.  She was seen recently by our PA for this. She sent her for a stress perfusion study which demonstrated no evidence of ischemia by her ejection fraction was low at 24%. She is known to have a reduced ejection fraction and it was 35% by echo earlier this summer. She did wear a monitor and she had occasional ventricular ectopy. She had atrial ectopy with some nonsustained runs. Some of this is irregular but not clearly fibrillation. She actually doesn't feel any palpitations. She still feels some chest discomfort but it seems to be sporadic. She carries voiding she'll get short of breath but she doesn't get chest pain. When she is raking she feels fine and doesn't get short of breath or chest discomfort. The pain is shooting across her chest and sporadic.  She doesn't describe PND or orthopnea. She's had no syncope. She remains active and uses wood in her home and does walking to her church.  Allergies  Allergen Reactions  . Lisinopril     REACTION: INTOL to ACE's w/ cough  . Penicillins     REACTION: rash  . Statins     Muscle Aches    Current Outpatient Prescriptions  Medication Sig Dispense Refill  . acetaminophen (TYLENOL) 650 MG CR tablet Take 1,300 mg by mouth every 8 (eight) hours as needed for pain.    Marland Kitchen amLODipine (NORVASC) 10 MG tablet Take 10 mg by mouth at bedtime.    Marland Kitchen aspirin 81 MG tablet Take 81 mg by mouth at bedtime.    Marland Kitchen ezetimibe (ZETIA) 10 MG tablet Take 1 tablet (10 mg total) by mouth daily. 90 tablet 3  . furosemide (LASIX) 20 MG tablet Take 1 tablet (20 mg total) by mouth daily. 30 tablet 3  . losartan (COZAAR) 100 MG tablet Take 100 mg by mouth at bedtime.    . methocarbamol (ROBAXIN) 500 MG tablet Take 1 tablet (500 mg total) by mouth every 8 (eight) hours as needed for muscle spasms. 20 tablet 0  . metoprolol succinate (TOPROL-XL) 50 MG 24 hr tablet Take 1 tablet (50 mg total) by mouth daily. Take with or  immediately following a meal. 90 tablet 3  . pioglitazone (ACTOS) 45 MG tablet Take 45 mg by mouth at bedtime.    . repaglinide (PRANDIN) 2 MG tablet Take 1 tablet (2 mg total) by mouth 3 (three) times daily before meals. 90 tablet 11   No current facility-administered medications for this visit.    Past Medical History  Diagnosis Date  . Pulmonary embolism ([place]) 02/2009    on anticoag thru 11/2010 - stopped due to risk>benefit  . Hypertension   . CAD (coronary artery disease)     nonobst on cath 2010  . Cardiomyopathy     hypertensive  . Paroxysmal atrial fibrillation (HCC)     not anticoag candidate  . Hypercholesterolemia   . Diabetes mellitus     hx noncompliance with rx'd tx  . Nontoxic multinodular goiter   . Pure hypercholesterolemia   . GERD (gastroesophageal reflux disease)     hx DU with H pylori 01/2009 and GIB, s/p triple rx  . Benign neoplasm of adrenal gland   . Diverticulosis of colon (without mention of hemorrhage)   . Calculus of kidney   . Anxiety   . H. pylori infection   . DJD (degenerative joint disease)   . Hx of subarachnoid hemorrhage 2001  . GI bleed  2012    duoedonal ulcer    Past Surgical History  Procedure Laterality Date  . Knee surgery      left  . Shoulder surgery      right  . Cataract extraction      ROS:  As stated in the HPI and negative for all other systems.  PHYSICAL EXAM BP 140/92 mmHg  Pulse 78  Ht 5' 2.5" (1.588 m)  Wt 136 lb 6.4 oz (61.871 kg)  BMI 24.54 kg/m2 GENERAL:  Well appearing HEENT:  Pupils equal round and reactive, fundi not visualized, oral mucosa unremarkable NECK:  No jugular venous distention, waveform within normal limits, carotid upstroke brisk and symmetric, no bruits, no thyromegaly LYMPHATICS:  No cervical, inguinal adenopathy LUNGS:  Clear to auscultation bilaterally BACK:  No CVA tenderness CHEST:  Unremarkable HEART:  PMI not displaced or sustained, positive right ventricular lift,S1 and S2 within  normal limits, no S3, no S4, no clicks, no rubs, no murmurs ABD:  Flat, positive bowel sounds normal in frequency in pitch, no bruits, no rebound, no guarding, no midline pulsatile mass, no hepatomegaly, no splenomegaly EXT:  2 plus pulses throughout, no edema, no cyanosis no clubbing SKIN:  No rashes no nodules NEURO:  Cranial nerves II through XII grossly intact, motor grossly intact throughout PSYCH:  Cognitively intact, oriented to person place and time   ASSESSMENT AND PLAN   CHEST PAIN The patient did have nonobstructive coronary disease on cath in 2010.  Her last perfusion study done recently demonstrated no ischemia.  Her pain is atypical. I don't think further cardiac workup is suggested.  CARDIOMYOPATHY  Her EF was moderately reduced.  This was unchanged from previous on echo last year. I suspect a hypertensive cardiomyopathy. I will continue current therapy.  HYPERTENSION  The blood pressure is at target. No change in medications is indicated. We will continue with therapeutic lifestyle changes (TLC).  RENAL INSUFFICIENCY  Her last creat was 1.93.  I will defer follow up to Gwendolyn Grant, MD   ECTOPY She is not feeling this.  I don't clearly see atrial fibrillation. She would be high risk for anticoagulation with previous GI bleeding. She's not symptomatic. I will not change her therapy.  Holter and nuclear test reviewed.

## 2015-07-24 ENCOUNTER — Other Ambulatory Visit (INDEPENDENT_AMBULATORY_CARE_PROVIDER_SITE_OTHER): Payer: Medicare Other

## 2015-07-24 ENCOUNTER — Encounter: Payer: Self-pay | Admitting: Internal Medicine

## 2015-07-24 ENCOUNTER — Ambulatory Visit (INDEPENDENT_AMBULATORY_CARE_PROVIDER_SITE_OTHER): Payer: Medicare Other | Admitting: Internal Medicine

## 2015-07-24 VITALS — BP 138/84 | HR 83 | Temp 97.5°F | Ht 62.5 in | Wt 133.1 lb

## 2015-07-24 DIAGNOSIS — E7800 Pure hypercholesterolemia, unspecified: Secondary | ICD-10-CM | POA: Diagnosis not present

## 2015-07-24 DIAGNOSIS — N183 Chronic kidney disease, stage 3 unspecified: Secondary | ICD-10-CM

## 2015-07-24 DIAGNOSIS — J069 Acute upper respiratory infection, unspecified: Secondary | ICD-10-CM

## 2015-07-24 DIAGNOSIS — E1122 Type 2 diabetes mellitus with diabetic chronic kidney disease: Secondary | ICD-10-CM

## 2015-07-24 DIAGNOSIS — I10 Essential (primary) hypertension: Secondary | ICD-10-CM

## 2015-07-24 DIAGNOSIS — B9789 Other viral agents as the cause of diseases classified elsewhere: Secondary | ICD-10-CM

## 2015-07-24 LAB — LIPID PANEL
CHOL/HDL RATIO: 3
CHOLESTEROL: 218 mg/dL — AB (ref 0–200)
HDL: 75 mg/dL (ref >39.00)
LDL Cholesterol: 122 mg/dL — ABNORMAL HIGH (ref 0–99)
NonHDL: 143.16
TRIGLYCERIDES: 105 mg/dL (ref 0.0–149.0)
VLDL: 21 mg/dL (ref 0.0–40.0)

## 2015-07-24 LAB — BASIC METABOLIC PANEL
BUN: 27 mg/dL — ABNORMAL HIGH (ref 6–23)
CHLORIDE: 103 meq/L (ref 96–112)
CO2: 29 meq/L (ref 19–32)
CREATININE: 1.88 mg/dL — AB (ref 0.40–1.20)
Calcium: 9 mg/dL (ref 8.4–10.5)
GFR: 32.66 mL/min — ABNORMAL LOW (ref >60.00)
Glucose, Bld: 199 mg/dL — ABNORMAL HIGH (ref 70–99)
Potassium: 3.7 mEq/L (ref 3.5–5.1)
SODIUM: 140 meq/L (ref 135–145)

## 2015-07-24 MED ORDER — PROMETHAZINE-DM 6.25-15 MG/5ML PO SYRP: 2.5000 mL | ORAL_SOLUTION | Freq: Four times a day (QID) | ORAL | Status: DC | PRN

## 2015-07-24 NOTE — Patient Instructions (Signed)
It was good to see you today.  We have reviewed your prior records including labs and tests today  Test(s) ordered today. Your results will be released to [place] (or called to you) after review, usually within 72hours after test completion. If any changes need to be made, you will be notified at that same time.  Medications reviewed and updated Use prescription cough syrup for symptoms as directed. This has been sent to your pharmacy. No other changes recommended at this time.  If you develop worsening symptoms or fever, call and we can reconsider antibiotics, but it does not appear necessary to use antibiotics at this time.  we'll make referral to kidney specialist for review of your kidney function . Our office will contact you regarding appointment(s) once made.  Please schedule followup in 3-4 months with new PCP, please call sooner if problems.

## 2015-07-24 NOTE — Assessment & Plan Note (Signed)
BP Readings from Last 3 Encounters:  07/24/15 138/84  07/08/15 140/92  06/26/15 144/82   Reports her home BP "not high", but no home log to review Goals of BP reviewed - treatment complicated by hx medication non adherence encouraged compliance and education on importance of same provided rx'd amlodipine, losartan and metoprolol

## 2015-07-24 NOTE — Progress Notes (Signed)
Pre visit review using our clinic review tool, if applicable. No additional management support is needed unless otherwise documented below in the visit note. 

## 2015-07-24 NOTE — Assessment & Plan Note (Signed)
Intol to statin and refusal to take same documented Prev on Zetia, but no longer compliance with same due to lack of samples - will re-rx same now encouraged diet control of same and to follow up with lipid clinic on same Prefers "bitter melon [place] tea", cucumbers, garlic, cinnamon, lemon juice and vinegar to treat her cardiac risk factors

## 2015-07-24 NOTE — Assessment & Plan Note (Signed)
Progressive decline in GFR - On ARB Will refer to renal for eval now

## 2015-07-24 NOTE — Assessment & Plan Note (Signed)
Long history of medication non adherence Follows with endo for same - little impact on a1c Currently on Actos but reports Actos causes cough so unhappy about taking same Has declined Lantus, Januvia and does not take regular OHA as rx'd No metformin due to CKD Also rx'd ARB Long discussion, education provided on long term effects of uncontrolled DM and encouraged compliance with OHA as rx'd Lab Results  Component Value Date   HGBA1C 7.5 06/26/2015

## 2015-07-24 NOTE — Progress Notes (Signed)
Subjective:    Patient ID: [name], female    DOB: 1927-09-17, 81 y.o.   MRN: [id number]  HPI  Patient here for follow up - reviewed chronic conditions and current concerns: head cold since Sunday (4 days), using OTC AlkSltzer and EmergenC  Past Medical History  Diagnosis Date  . Pulmonary embolism ([place]) 02/2009    on anticoag thru 11/2010 - stopped due to risk>benefit  . Hypertension   . CAD (coronary artery disease)     nonobst on cath 2010  . Cardiomyopathy     hypertensive  . Paroxysmal atrial fibrillation ([facility])     not anticoag candidate  . Hypercholesterolemia   . Diabetes mellitus     hx noncompliance with rx'd tx  . Nontoxic multinodular goiter   . Pure hypercholesterolemia   . GERD (gastroesophageal reflux disease)     hx DU with H pylori 01/2009 and GIB, s/p triple rx  . Benign neoplasm of adrenal gland   . Diverticulosis of colon (without mention of hemorrhage)   . Calculus of kidney   . Anxiety   . H. pylori infection   . DJD (degenerative joint disease)   . Hx of subarachnoid hemorrhage 2001  . GI bleed 2012    duoedonal ulcer    Review of Systems  Constitutional: Positive for fatigue. Negative for fever.  HENT: Positive for congestion, postnasal drip, rhinorrhea, sinus pressure and sneezing. Negative for ear pain, facial swelling, sore throat and trouble swallowing.   Respiratory: Positive for cough (yellow thick phlegm) and chest tightness. Negative for shortness of breath and wheezing.   Cardiovascular: Negative for chest pain and leg swelling.       Objective:    Physical Exam  Constitutional: She appears well-developed and well-nourished. No distress.  Cardiovascular: Normal rate, regular rhythm and normal heart sounds.   No murmur heard. Pulmonary/Chest: Effort normal and breath sounds normal. No respiratory distress. She has no wheezes. She has no rales. She exhibits no tenderness.  Musculoskeletal: She exhibits no edema.    Lymphadenopathy:    She has no cervical adenopathy.    BP 138/84 mmHg  Pulse 83  Temp(Src) 97.5 F (36.4 C) (Oral)  Ht 5' 2.5" (1.588 m)  Wt 133 lb 2 oz (60.385 kg)  BMI 23.95 kg/m2  SpO2 97% Wt Readings from Last 3 Encounters:  07/24/15 133 lb 2 oz (60.385 kg)  07/08/15 136 lb 6.4 oz (61.871 kg)  07/02/15 131 lb (59.421 kg)    Lab Results  Component Value Date   WBC 7.2 04/08/2015   HGB 12.4 04/08/2015   HCT 37.2 04/08/2015   PLT 179.0 04/08/2015   GLUCOSE 119* 04/08/2015   CHOL 302* 04/19/2014   TRIG 181.0* 04/19/2014   HDL 81.50 04/19/2014   LDLDIRECT 135.7 04/26/2013   LDLCALC 184* 04/19/2014   ALT 11 04/08/2015   AST 18 04/08/2015   NA 141 04/08/2015   K 4.4 04/08/2015   CL 109 04/08/2015   CREATININE 1.93* 04/08/2015   BUN 40* 04/08/2015   CO2 25 04/08/2015   TSH 2.67 06/01/2013   INR 1.0 01/07/2011   HGBA1C 7.5 06/26/2015   MICROALBUR 74.2* 09/01/2013    Dg Chest 2 View  03/22/2015  CLINICAL DATA:  Left-sided chest discomfort since yesterday. History of pulmonary embolism, hypertension and diabetes. Initial encounter. EXAM: CHEST  2 VIEW COMPARISON:  Radiographs 10/09/2013.  CT 11/29/2010. FINDINGS: There is stable cardiac enlargement. The mediastinal contours are unchanged. The lungs are clear. There  is no pleural effusion or pneumothorax. Postsurgical changes are present at the right shoulder. No acute osseous findings seen. IMPRESSION: Stable cardiomegaly.  No acute cardiopulmonary process. Electronically Signed   By: Richardean Sale M.D.   On: 03/22/2015 18:33   Dg Lumbar Spine 2-3 Views  03/23/2015  CLINICAL DATA:  81 year old female with left-sided low back pain. EXAM: LUMBAR SPINE - 2-3 VIEW COMPARISON:  CT the abdomen and pelvis 10/09/2013. FINDINGS: Three views of the lumbar spine demonstrate no definite acute displaced fracture or compression type fracture. There is mild levoscoliosis of the lumbar spine convex to the left at the level of L3. 4 mm of  anterolisthesis of L4 upon L5 is unchanged. Alignment is otherwise anatomic. Mild multilevel degenerative disc disease, most pronounced at L4-L5. Multilevel facet arthropathy, most severe at L4-L5 and L5-S1. IMPRESSION: 1. No acute abnormality of the lumbar spine. 2. Multilevel degenerative disc disease, lumbar spondylosis and mild levoscoliosis of the lumbar spine, similar prior studies, as above. Electronically Signed   By: Vinnie Langton M.D.   On: 03/23/2015 12:34   Nm Pulmonary Perf And Vent  03/23/2015  CLINICAL DATA:  Patient presents 03/22/15 with intermittent left-sided chest pain that comes and goes throughout the day. Pain lasted a few minutes. She has shortness of breath that is worse with exertion. Patient with remote history of pulmonary embolism no longer on anticoagulation. EXAM: NUCLEAR MEDICINE VENTILATION - PERFUSION LUNG SCAN TECHNIQUE: Ventilation images were obtained in multiple projections using inhaled aerosol Tc-80m DTPA. Perfusion images were obtained in multiple projections after intravenous injection of Tc-11m MAA. RADIOPHARMACEUTICALS:  38.3 mCi IV Technetium-23m DTPA aerosol inhalation and 5.96 mCi of Technetium-93m MAA IV COMPARISON:  Chest radiograph, 03/22/2015 FINDINGS: Ventilation: No focal ventilation defect. Perfusion: No wedge shaped peripheral perfusion defects to suggest acute pulmonary embolism. IMPRESSION: Normal ventilation-perfusion study. No evidence of pulmonary thromboembolism. Electronically Signed   By: Lajean Manes M.D.   On: 03/23/2015 11:58       Assessment & Plan:   Viral URI with cough - lung exam clear. Afebrile and normal O2 sats. Will treat symptomatically with promethazine and DM syrup (erx done). Continue antihistamine as needed with Alka-Seltzer and call if worsening symptoms over the next 5 days, sooner if fever or other problems. Explain lack of efficacy for antibiotics in viral disease, but patient will notify us if worse or if symptoms  change  Problem List Items Addressed This Visit    CKD stage 3 due to type 2 diabetes mellitus ([place])    Progressive decline in GFR - On ARB Will refer to renal for eval now      Relevant Orders   Basic metabolic panel   Ambulatory referral to Nephrology   Diabetes [facility]) - Primary    Long history of medication non adherence Follows with endo for same - little impact on a1c Currently on Actos but reports Actos causes cough so unhappy about taking same Has declined Lantus, Januvia and does not take regular OHA as rx'd No metformin due to CKD Also rx'd ARB Long discussion, education provided on long term effects of uncontrolled DM and encouraged compliance with OHA as rx'd Lab Results  Component Value Date   HGBA1C 7.5 06/26/2015        Relevant Orders   Basic metabolic panel   Lipid panel   Ambulatory referral to Nephrology   Essential hypertension    BP Readings from Last 3 Encounters:  07/24/15 138/84  07/08/15 140/92  06/26/15 144/82  Reports her home BP "not high", but no home log to review Goals of BP reviewed - treatment complicated by hx medication non adherence encouraged compliance and education on importance of same provided rx'd amlodipine, losartan and metoprolol      Relevant Orders   Basic metabolic panel   Lipid panel   Ambulatory referral to Nephrology   HYPERCHOLESTEROLEMIA    Intol to statin and refusal to take same documented Prev on Zetia, but no longer compliance with same due to lack of samples - will re-rx same now encouraged diet control of same and to follow up with lipid clinic on same Prefers "bitter melon [place] tea", cucumbers, garlic, cinnamon, lemon juice and vinegar to treat her cardiac risk factors      Relevant Orders   Lipid panel    Other Visit Diagnoses    Viral URI with cough            Gwendolyn Grant, MD

## 2015-07-31 DIAGNOSIS — E119 Type 2 diabetes mellitus without complications: Secondary | ICD-10-CM | POA: Diagnosis not present

## 2015-07-31 DIAGNOSIS — H43313 Vitreous membranes and strands, bilateral: Secondary | ICD-10-CM | POA: Diagnosis not present

## 2015-07-31 LAB — HM DIABETES EYE EXAM

## 2015-09-26 ENCOUNTER — Ambulatory Visit (INDEPENDENT_AMBULATORY_CARE_PROVIDER_SITE_OTHER): Payer: Medicare Other | Admitting: Endocrinology

## 2015-09-26 ENCOUNTER — Encounter: Payer: Self-pay | Admitting: Endocrinology

## 2015-09-26 VITALS — BP 152/94 | HR 96 | Temp 97.9°F | Ht 62.5 in | Wt 129.0 lb

## 2015-09-26 DIAGNOSIS — N183 Chronic kidney disease, stage 3 (moderate): Secondary | ICD-10-CM | POA: Diagnosis not present

## 2015-09-26 DIAGNOSIS — E1122 Type 2 diabetes mellitus with diabetic chronic kidney disease: Secondary | ICD-10-CM | POA: Diagnosis not present

## 2015-09-26 LAB — POCT GLYCOSYLATED HEMOGLOBIN (HGB A1C): HEMOGLOBIN A1C: 7.1

## 2015-09-26 NOTE — Patient Instructions (Addendum)
check your blood sugar once a day.  vary the time of day when you check, between before the 3 meals, and at bedtime.  also check if you have symptoms of your blood sugar being too high or too low.  please keep a record of the readings and bring it to your next appointment here.  You can write it on any piece of paper.  please call us sooner if your blood sugar goes below 70, or if you have a lot of readings over 200.  Please continue the same diabetes pills.  Please come back for a follow-up appointment in 3 months.    

## 2015-09-26 NOTE — Progress Notes (Signed)
Subjective:    Patient ID: [name], female    DOB: October 07, 1927, 82 y.o.   MRN: [id number]  HPI Pt returns for f/u of diabetes mellitus: DM type: 2 Dx'ed: 0000000 Complications: polyneuropathy, renal insufficiency, TIA, and CAD.   Therapy: 2 oral meds.  GDM: never.   DKA: never Severe hypoglycemia: never.   Pancreatitis: never. Other: she took insulin 2014-2015; oral rx options are limited by renal insufficiency and her request for generic meds.    Interval history: no cbg record, but states cbg's are well-controlled.  pt states she feels well in general.  She takes meds as rx'ed.   Past Medical History  Diagnosis Date  . Pulmonary embolism ([place]) 02/2009    on anticoag thru 11/2010 - stopped due to risk>benefit  . Hypertension   . CAD (coronary artery disease)     nonobst on cath 2010  . Cardiomyopathy     hypertensive  . Paroxysmal atrial fibrillation ([facility])     not anticoag candidate  . Hypercholesterolemia   . Diabetes mellitus     hx noncompliance with rx'd tx  . Nontoxic multinodular goiter   . Pure hypercholesterolemia   . GERD (gastroesophageal reflux disease)     hx DU with H pylori 01/2009 and GIB, s/p triple rx  . Benign neoplasm of adrenal gland   . Diverticulosis of colon (without mention of hemorrhage)   . Calculus of kidney   . Anxiety   . H. pylori infection   . DJD (degenerative joint disease)   . Hx of subarachnoid hemorrhage 2001  . GI bleed 2012    duoedonal ulcer    Past Surgical History  Procedure Laterality Date  . Knee surgery      left  . Shoulder surgery      right  . Cataract extraction      Social History   Social History  . Marital Status: Widowed    Spouse Name: N/A  . Number of Children: 1  . Years of Education: N/A   Occupational History  . retired    Social History Main Topics  . Smoking status: Former Smoker -- 0.30 packs/day for 36 years    Types: Cigarettes    Quit date: 08/18/1983  . Smokeless tobacco: Never  Used  . Alcohol Use: 0.6 oz/week    1 Glasses of wine per week     Comment: occ wine, beer during holidays or at social events  . Drug Use: No  . Sexual Activity: Not Currently   Other Topics Concern  . Not on file   Social History Narrative    Current Outpatient Prescriptions on File Prior to Visit  Medication Sig Dispense Refill  . acetaminophen (TYLENOL) 650 MG CR tablet Take 1,300 mg by mouth every 8 (eight) hours as needed for pain.    Marland Kitchen amLODipine (NORVASC) 10 MG tablet Take 10 mg by mouth at bedtime.    Marland Kitchen aspirin 81 MG tablet Take 81 mg by mouth at bedtime.    . furosemide (LASIX) 20 MG tablet Take 1 tablet (20 mg total) by mouth daily. 30 tablet 3  . losartan (COZAAR) 100 MG tablet Take 100 mg by mouth at bedtime.    . methocarbamol (ROBAXIN) 500 MG tablet Take 1 tablet (500 mg total) by mouth every 8 (eight) hours as needed for muscle spasms. 20 tablet 0  . metoprolol succinate (TOPROL-XL) 50 MG 24 hr tablet Take 1 tablet (50 mg total) by mouth daily. Take  with or immediately following a meal. 90 tablet 3  . pioglitazone (ACTOS) 45 MG tablet Take 45 mg by mouth at bedtime.    . repaglinide (PRANDIN) 2 MG tablet Take 1 tablet (2 mg total) by mouth 3 (three) times daily before meals. 90 tablet 11   No current facility-administered medications on file prior to visit.    Allergies  Allergen Reactions  . Lisinopril     REACTION: INTOL to ACE's w/ cough  . Penicillins     REACTION: rash  . Statins     Muscle Aches    Family History  Problem Relation Age of Onset  . Diabetes Maternal Grandmother   . Heart disease Maternal Aunt     x2  . Lung cancer Mother   . Brain cancer Mother   . Goiter Mother   . Cancer Mother   . Heart disease Mother   . Diabetes Mother   . Colon cancer Neg Hx     BP 152/94 mmHg  Pulse 96  Temp(Src) 97.9 F (36.6 C) (Oral)  Ht 5' 2.5" (1.588 m)  Wt 129 lb (58.514 kg)  BMI 23.20 kg/m2  SpO2 95%  Review of Systems She denies  hypoglycemia    Objective:   Physical Exam VITAL SIGNS:  See vs page GENERAL: no distress Pulses: dorsalis pedis intact bilat.   MSK: no deformity of the feet CV: no leg edema Skin:  no ulcer on the feet.  normal color and temp on the feet. Neuro: sensation is intact to touch on the feet   A1c=7.1%     Assessment & Plan:  DM: well-controlled, given pt's advanced age  Patient is advised the following: Patient Instructions  check your blood sugar once a day.  vary the time of day when you check, between before the 3 meals, and at bedtime.  also check if you have symptoms of your blood sugar being too high or too low.  please keep a record of the readings and bring it to your next appointment here.  You can write it on any piece of paper.  please call us sooner if your blood sugar goes below 70, or if you have a lot of readings over 200.  Please continue the same diabetes pills.   Please come back for a follow-up appointment in 3 months.

## 2015-10-07 ENCOUNTER — Other Ambulatory Visit: Payer: Self-pay | Admitting: Endocrinology

## 2015-11-09 ENCOUNTER — Emergency Department (HOSPITAL_COMMUNITY)
Admission: EM | Admit: 2015-11-09 | Discharge: 2015-11-09 | Disposition: A | Discharge status: Home / Self Care | Payer: Medicare Other | Attending: Emergency Medicine | Admitting: Emergency Medicine

## 2015-11-09 ENCOUNTER — Encounter (HOSPITAL_COMMUNITY): Payer: Self-pay | Admitting: Nurse Practitioner

## 2015-11-09 DIAGNOSIS — E119 Type 2 diabetes mellitus without complications: Secondary | ICD-10-CM | POA: Diagnosis not present

## 2015-11-09 DIAGNOSIS — Z87891 Personal history of nicotine dependence: Secondary | ICD-10-CM | POA: Insufficient documentation

## 2015-11-09 DIAGNOSIS — Z8619 Personal history of other infectious and parasitic diseases: Secondary | ICD-10-CM | POA: Insufficient documentation

## 2015-11-09 DIAGNOSIS — Z86711 Personal history of pulmonary embolism: Secondary | ICD-10-CM | POA: Insufficient documentation

## 2015-11-09 DIAGNOSIS — M1990 Unspecified osteoarthritis, unspecified site: Secondary | ICD-10-CM | POA: Diagnosis not present

## 2015-11-09 DIAGNOSIS — I129 Hypertensive chronic kidney disease with stage 1 through stage 4 chronic kidney disease, or unspecified chronic kidney disease: Secondary | ICD-10-CM | POA: Insufficient documentation

## 2015-11-09 DIAGNOSIS — R195 Other fecal abnormalities: Secondary | ICD-10-CM | POA: Diagnosis not present

## 2015-11-09 DIAGNOSIS — Z7982 Long term (current) use of aspirin: Secondary | ICD-10-CM | POA: Insufficient documentation

## 2015-11-09 DIAGNOSIS — Z79899 Other long term (current) drug therapy: Secondary | ICD-10-CM | POA: Insufficient documentation

## 2015-11-09 DIAGNOSIS — Z7984 Long term (current) use of oral hypoglycemic drugs: Secondary | ICD-10-CM | POA: Insufficient documentation

## 2015-11-09 DIAGNOSIS — Z86018 Personal history of other benign neoplasm: Secondary | ICD-10-CM | POA: Insufficient documentation

## 2015-11-09 DIAGNOSIS — I2510 Atherosclerotic heart disease of native coronary artery without angina pectoris: Secondary | ICD-10-CM | POA: Diagnosis not present

## 2015-11-09 DIAGNOSIS — Z880 Allergy status to penicillin: Secondary | ICD-10-CM | POA: Insufficient documentation

## 2015-11-09 DIAGNOSIS — Z8659 Personal history of other mental and behavioral disorders: Secondary | ICD-10-CM | POA: Diagnosis not present

## 2015-11-09 DIAGNOSIS — Z87442 Personal history of urinary calculi: Secondary | ICD-10-CM | POA: Insufficient documentation

## 2015-11-09 DIAGNOSIS — N189 Chronic kidney disease, unspecified: Secondary | ICD-10-CM | POA: Diagnosis not present

## 2015-11-09 LAB — TYPE AND SCREEN
ABO/RH(D): B POS
ANTIBODY SCREEN: NEGATIVE

## 2015-11-09 LAB — COMPREHENSIVE METABOLIC PANEL
ALBUMIN: 3.3 g/dL — AB (ref 3.5–5.0)
ALK PHOS: 37 U/L — AB (ref 38–126)
ALT: 16 U/L (ref 14–54)
AST: 27 U/L (ref 15–41)
Anion gap: 9 (ref 5–15)
BILIRUBIN TOTAL: 0.5 mg/dL (ref 0.3–1.2)
BUN: 33 mg/dL — AB (ref 6–20)
CO2: 24 mmol/L (ref 22–32)
Calcium: 9.1 mg/dL (ref 8.9–10.3)
Chloride: 110 mmol/L (ref 101–111)
Creatinine, Ser: 2.11 mg/dL — ABNORMAL HIGH (ref 0.44–1.00)
GFR calc Af Amer: 23 mL/min — ABNORMAL LOW (ref >60)
GFR calc non Af Amer: 20 mL/min — ABNORMAL LOW (ref >60)
GLUCOSE: 124 mg/dL — AB (ref 65–99)
POTASSIUM: 4.3 mmol/L (ref 3.5–5.1)
Sodium: 143 mmol/L (ref 135–145)
TOTAL PROTEIN: 6.7 g/dL (ref 6.5–8.1)

## 2015-11-09 LAB — CBC
HEMATOCRIT: 38.9 % (ref 36.0–46.0)
Hemoglobin: 12.4 g/dL (ref 12.0–15.0)
MCH: 26.7 pg (ref 26.0–34.0)
MCHC: 31.9 g/dL (ref 30.0–36.0)
MCV: 83.7 fL (ref 78.0–100.0)
Platelets: 112 10*3/uL — ABNORMAL LOW (ref 150–400)
RBC: 4.65 MIL/uL (ref 3.87–5.11)
RDW: 13.8 % (ref 11.5–15.5)
WBC: 3.4 10*3/uL — ABNORMAL LOW (ref 4.0–10.5)

## 2015-11-09 NOTE — ED Notes (Signed)
She c/o several runny, dark black bowel movements yesterday. She took pepto bismol yesterday for the diarrhea and has not had another BM since. She had RLQ abd pain and a fever on Monday that resolved. She reports nausea. denies vomiting, urinary chagnes

## 2015-11-09 NOTE — ED Notes (Signed)
HEMOCCULT NEGATIVE

## 2015-11-09 NOTE — ED Provider Notes (Signed)
CSN: [id number]     Arrival date & time 11/09/15  1241 History   First MD Initiated Contact with Patient 11/09/15 1510     Chief Complaint  Patient presents with  . GI Bleeding     (Consider location/radiation/quality/duration/timing/severity/associated sxs/prior Treatment) The history is provided by the patient and medical records. No language interpreter was used.     [name] is a 82 y.o. female  with a hx of PE (not anticoagulated), HTN, CAD, cardiomyopathy, NIDDM, GERD presents to the Emergency Department complaining of gradual, persistent, progressively worsening dark stools onset 3 days.  Pt reports the stool was watery, but black and tarry.  She reports a hx of GI bleeding.  She states she took [employer] yesterday, but none before.  She reports this helped to stop the diarrhea.  She reports RLQ abd pain 5 days ago that resolved spontaneously.  She reports a low grade fever (subjective) 6 days ago.  She reports this also resolved spontaneously.  Associated symptoms include nausea without vomiting.  Denies bright red blood per rectum.  Nothing makes the symptoms better or worse.  Pt denies chills, headache, neck pain, chest pain, SOB, weakness, dizziness, syncope, dysuria, hematuria.  Pt denies any other source of bleeding.      Past Medical History  Diagnosis Date  . Pulmonary embolism ([place]) 02/2009    on anticoag thru 11/2010 - stopped due to risk>benefit  . Hypertension   . CAD (coronary artery disease)     nonobst on cath 2010  . Cardiomyopathy     hypertensive  . Paroxysmal atrial fibrillation ([facility])     not anticoag candidate  . Hypercholesterolemia   . Diabetes mellitus     hx noncompliance with rx'd tx  . Nontoxic multinodular goiter   . Pure hypercholesterolemia   . GERD (gastroesophageal reflux disease)     hx DU with H pylori 01/2009 and GIB, s/p triple rx  . Benign neoplasm of adrenal gland   . Diverticulosis of colon (without mention of hemorrhage)   .  Calculus of kidney   . Anxiety   . H. pylori infection   . DJD (degenerative joint disease)   . Hx of subarachnoid hemorrhage 2001  . GI bleed 2012    duoedonal ulcer   Past Surgical History  Procedure Laterality Date  . Knee surgery      left  . Shoulder surgery      right  . Cataract extraction     Family History  Problem Relation Age of Onset  . Diabetes Maternal Grandmother   . Heart disease Maternal Aunt     x2  . Lung cancer Mother   . Brain cancer Mother   . Goiter Mother   . Cancer Mother   . Heart disease Mother   . Diabetes Mother   . Colon cancer Neg Hx    Social History  Substance Use Topics  . Smoking status: Former Smoker -- 0.30 packs/day for 36 years    Types: Cigarettes    Quit date: 08/18/1983  . Smokeless tobacco: Never Used  . Alcohol Use: 0.6 oz/week    1 Glasses of wine per week     Comment: occ wine, beer during holidays or at social events   OB History    No data available     Review of Systems  Constitutional: Positive for fever ( subjective, resolved ) and appetite change ( decreased). Negative for diaphoresis, fatigue and unexpected weight change.  HENT: Negative for mouth sores.   Eyes: Negative for visual disturbance.  Respiratory: Negative for cough, chest tightness, shortness of breath and wheezing.   Cardiovascular: Negative for chest pain.  Gastrointestinal: Positive for nausea, abdominal pain ( resolved) and blood in stool. Negative for vomiting, diarrhea and constipation.  Endocrine: Negative for polydipsia, polyphagia and polyuria.  Genitourinary: Negative for dysuria, urgency, frequency and hematuria.  Musculoskeletal: Negative for back pain and neck stiffness.  Skin: Negative for rash.  Allergic/Immunologic: Negative for immunocompromised state.  Neurological: Negative for syncope, light-headedness and headaches.  Hematological: Does not bruise/bleed easily.  Psychiatric/Behavioral: Negative for sleep disturbance. The  patient is not nervous/anxious.       Allergies  Statins; Lisinopril; and Penicillins  Home Medications   Prior to Admission medications   Medication Sig Start Date End Date Taking? Authorizing Provider  amLODipine (NORVASC) 10 MG tablet Take 10 mg by mouth at bedtime.   Yes Historical Provider, MD  Ascorbic Acid (VITAMIN C) 1000 MG tablet Take 1,000 mg by mouth daily as needed (FOR IMMUNE).   Yes Historical Provider, MD  furosemide (LASIX) 20 MG tablet Take 1 tablet (20 mg total) by mouth daily. 05/17/15  Yes Peter M Martinique, MD  losartan (COZAAR) 100 MG tablet Take 100 mg by mouth at bedtime.   Yes Historical Provider, MD  metoprolol succinate (TOPROL-XL) 50 MG 24 hr tablet Take 1 tablet (50 mg total) by mouth daily. Take with or immediately following a meal. 01/01/15  Yes Rowe Clack, MD  Phenyleph-CPM-DM-APAP (ALKA-SELTZER PLUS COLD & FLU) 12-16-08-250 MG TBEF Take 2 tablets by mouth daily as needed (FOR COLD).   Yes Historical Provider, MD  pioglitazone (ACTOS) 45 MG tablet Take 45 mg by mouth at bedtime.   Yes Historical Provider, MD  repaglinide (PRANDIN) 2 MG tablet TAKE 1 TABLET (2 MG TOTAL) BY MOUTH 3 (THREE) TIMES DAILY BEFORE MEALS. Patient taking differently: TAKE 1 TABLET (2 MG TOTAL) BY MOUTH ONCE AT BEDTIME 10/08/15  Yes Renato Shin, MD  aspirin 81 MG tablet Take 81 mg by mouth at bedtime. Reported on 11/09/2015    Historical Provider, MD  methocarbamol (ROBAXIN) 500 MG tablet Take 1 tablet (500 mg total) by mouth every 8 (eight) hours as needed for muscle spasms. 03/24/15   Thurnell Lose, MD   BP 155/72 mmHg  Pulse 51  Temp(Src) 98.1 F (36.7 C) (Oral)  Resp 18  Ht 5\' 3"  (1.6 m)  Wt 53.524 kg  BMI 20.91 kg/m2  SpO2 100% Physical Exam  Constitutional: She is oriented to person, place, and time. She appears well-developed and well-nourished. No distress.  Awake, alert, nontoxic appearance Elderly   HENT:  Head: Normocephalic and atraumatic.  Mouth/Throat:  Oropharynx is clear and moist. No oropharyngeal exudate.  Eyes: Conjunctivae are normal. No scleral icterus.  Neck: Normal range of motion. Neck supple.  Cardiovascular: Normal rate, regular rhythm, normal heart sounds and intact distal pulses.   Pulmonary/Chest: Effort normal and breath sounds normal. No respiratory distress. She has no wheezes.  Equal chest expansion  Abdominal: Soft. Bowel sounds are normal. She exhibits no distension and no mass. There is no tenderness. There is no rebound and no guarding.  Soft and nontender  Genitourinary:  Black stool around the anus  Musculoskeletal: Normal range of motion. She exhibits no edema.  Neurological: She is alert and oriented to person, place, and time.  Speech is clear and goal oriented Moves extremities without ataxia  Skin: Skin is warm  and dry. She is not diaphoretic. No erythema.  Psychiatric: She has a normal mood and affect.  Nursing note and vitals reviewed.   ED Course  Procedures (including critical care time) Labs Review Labs Reviewed  COMPREHENSIVE METABOLIC PANEL - Abnormal; Notable for the following:    Glucose, Bld 124 (*)    BUN 33 (*)    Creatinine, Ser 2.11 (*)    Albumin 3.3 (*)    Alkaline Phosphatase 37 (*)    GFR calc non Af Amer 20 (*)    GFR calc Af Amer 23 (*)    All other components within normal limits  CBC - Abnormal; Notable for the following:    WBC 3.4 (*)    Platelets 112 (*)    All other components within normal limits  POC OCCULT BLOOD, ED  TYPE AND SCREEN    MDM   Final diagnoses:  Dark stools  CKD (chronic kidney disease), unspecified stage   Liliane Channel presents with black tarry stool in the last 3 days.  Pt is well appearing. Abd is soft and nontender.  Hemoccult is negative and patient's hemoglobin is normal. Serum creatinine 2.11 with history of CAD and not much increased from baseline.  Discussed with patient potential for GI bleed versus black tarry stool for metabolism  of Pepto-Bismol. She will need follow-up with gastroenterology. Discussed reasons for return to the emergency room including persistent black stool, weakness, dizziness, shortness of breath, chest pain, bright red blood or any other concerns.  BP 153/76 mmHg  Pulse 43  Temp(Src) 98.1 F (36.7 C) (Oral)  Resp 18  Ht 5\' 3"  (1.6 m)  Wt 53.524 kg  BMI 20.91 kg/m2  SpO2 98%   The patient was discussed with and seen by Dr. Alvino Chapel who agrees with the treatment plan.   Jarrett Soho Briah Nary, PA-C 11/09/15 [place], MD 11/09/15 [phone]

## 2015-11-09 NOTE — Discharge Instructions (Signed)
1. Medications: usual home medications 2. Treatment: rest, drink plenty of fluids,  3. Follow Up: Please followup with your primary doctor in 2 and Gastroenterology 2-3 days for discussion of your diagnoses and further evaluation after today's visit; if you do not have a primary care doctor use the resource guide provided to find one; Please return to the ER for worsening symptoms, chest pain, shortness of breath, lightheadedness, blood per rectum

## 2015-11-11 ENCOUNTER — Other Ambulatory Visit: Payer: Self-pay | Admitting: Internal Medicine

## 2015-11-11 DIAGNOSIS — I10 Essential (primary) hypertension: Secondary | ICD-10-CM | POA: Diagnosis not present

## 2015-11-11 DIAGNOSIS — E119 Type 2 diabetes mellitus without complications: Secondary | ICD-10-CM | POA: Diagnosis not present

## 2015-11-11 DIAGNOSIS — N184 Chronic kidney disease, stage 4 (severe): Secondary | ICD-10-CM | POA: Diagnosis not present

## 2015-11-11 DIAGNOSIS — R809 Proteinuria, unspecified: Secondary | ICD-10-CM | POA: Diagnosis not present

## 2015-11-11 DIAGNOSIS — E785 Hyperlipidemia, unspecified: Secondary | ICD-10-CM | POA: Diagnosis not present

## 2015-11-13 ENCOUNTER — Ambulatory Visit (INDEPENDENT_AMBULATORY_CARE_PROVIDER_SITE_OTHER): Payer: Medicare Other | Admitting: Nurse Practitioner

## 2015-11-13 VITALS — BP 132/82 | HR 55 | Temp 97.4°F | Ht 63.0 in | Wt 123.0 lb

## 2015-11-13 DIAGNOSIS — J069 Acute upper respiratory infection, unspecified: Secondary | ICD-10-CM

## 2015-11-13 MED ORDER — AZITHROMYCIN 250 MG PO TABS: ORAL_TABLET | ORAL | Status: DC

## 2015-11-13 MED ORDER — GUAIFENESIN-CODEINE 100-10 MG/5ML PO SYRP: 5.0000 mL | ORAL_SOLUTION | Freq: Every day | ORAL | Status: DC

## 2015-11-13 NOTE — Patient Instructions (Signed)
5 mL (1 teaspoon) of cough syrup. Don't drive or make important decisions while taking this....just sleep and rest.   Z-pack as directed  Please take a probiotic ( Align, Floraque or Culturelle) while you are on the antibiotic to prevent a serious antibiotic associated diarrhea  Called clostirudium dificile colitis and a vaginal yeast infection. Generics are fine!

## 2015-11-13 NOTE — Progress Notes (Signed)
Patient ID: [name], female    DOB: 1928-07-10  Age: 82 y.o. MRN: [id number]  CC: OTHER   HPI [name] presents for CC of cough x 1.25 weeks.   1) Went to the ED for tarry stools 11/09/15 Seeing GI on 12/16/15  Coughing- clear mucous Rhinorrhea- clear   Treatment to date: Alkaseltzer cold plus   Denies sick contacts  History [name] has a past medical history of Pulmonary embolism ([place]) (02/2009); Hypertension; CAD (coronary artery disease); Cardiomyopathy; Paroxysmal atrial fibrillation ([place]); Hypercholesterolemia; Diabetes mellitus; Nontoxic multinodular goiter; Pure hypercholesterolemia; GERD (gastroesophageal reflux disease); Benign neoplasm of adrenal gland; Diverticulosis of colon (without mention of hemorrhage); Calculus of kidney; Anxiety; H. pylori infection; DJD (degenerative joint disease); subarachnoid hemorrhage (2001); and GI bleed (2012).   She has past surgical history that includes Knee surgery; Shoulder surgery; and Cataract extraction.   Her family history includes Brain cancer in her mother; Cancer in her mother; Diabetes in her maternal grandmother and mother; Goiter in her mother; Heart disease in her maternal aunt and mother; Lung cancer in her mother. There is no history of Colon cancer.She reports that she quit smoking about 32 years ago. Her smoking use included Cigarettes. She has a 10.8 pack-year smoking history. She has never used smokeless tobacco. She reports that she drinks about 0.6 oz of alcohol per week. She reports that she does not use illicit drugs.  Outpatient Prescriptions Prior to Visit  Medication Sig Dispense Refill  . amLODipine (NORVASC) 10 MG tablet Take 10 mg by mouth at bedtime.    . Ascorbic Acid (VITAMIN C) 1000 MG tablet Take 1,000 mg by mouth daily as needed (FOR IMMUNE).    Marland Kitchen aspirin 81 MG tablet Take 81 mg by mouth at bedtime. Reported on 11/09/2015    . furosemide (LASIX) 20 MG tablet Take 1 tablet (20 mg total) by  mouth daily. 30 tablet 3  . losartan (COZAAR) 100 MG tablet Take 100 mg by mouth at bedtime.    . methocarbamol (ROBAXIN) 500 MG tablet Take 1 tablet (500 mg total) by mouth every 8 (eight) hours as needed for muscle spasms. 20 tablet 0  . metoprolol succinate (TOPROL-XL) 50 MG 24 hr tablet Take 1 tablet (50 mg total) by mouth daily. Take with or immediately following a meal. 90 tablet 3  . Phenyleph-CPM-DM-APAP (ALKA-SELTZER PLUS COLD & FLU) 12-16-08-250 MG TBEF Take 2 tablets by mouth daily as needed (FOR COLD).    Marland Kitchen pioglitazone (ACTOS) 45 MG tablet Take 45 mg by mouth at bedtime.    . repaglinide (PRANDIN) 2 MG tablet TAKE 1 TABLET (2 MG TOTAL) BY MOUTH 3 (THREE) TIMES DAILY BEFORE MEALS. (Patient taking differently: TAKE 1 TABLET (2 MG TOTAL) BY MOUTH ONCE AT BEDTIME) 90 tablet 0   No facility-administered medications prior to visit.    ROS Review of Systems  Constitutional: Positive for fatigue. Negative for fever, chills and diaphoresis.  HENT: Positive for congestion, postnasal drip, rhinorrhea and sneezing. Negative for ear pain and sore throat.   Respiratory: Positive for cough. Negative for chest tightness, shortness of breath and wheezing.   Cardiovascular: Negative for chest pain, palpitations and leg swelling.  Gastrointestinal: Negative for nausea, vomiting and diarrhea.  Skin: Negative for rash.  Neurological: Negative for dizziness and headaches.    Objective:  BP 132/82 mmHg  Pulse 55  Temp(Src) 97.4 F (36.3 C) (Oral)  Ht 5\' 3"  (1.6 m)  Wt 123 lb (55.792 kg)  BMI 21.79  kg/m2  SpO2 95%  Physical Exam  Constitutional: She is oriented to person, place, and time. She appears well-developed and well-nourished. No distress.  HENT:  Head: Normocephalic and atraumatic.  Right Ear: External ear normal.  Left Ear: External ear normal.  Mouth/Throat: Oropharynx is clear and moist. No oropharyngeal exudate.  TMs clear bilaterally  Boggy Turbinates  Eyes: EOM are normal.  Pupils are equal, round, and reactive to light. Right eye exhibits no discharge. Left eye exhibits no discharge. No scleral icterus.  Neck: Normal range of motion. Neck supple.  Cardiovascular: Normal rate and regular rhythm.   Pulmonary/Chest: Effort normal and breath sounds normal. No respiratory distress. She has no wheezes. She has no rales. She exhibits no tenderness.  Lymphadenopathy:    She has no cervical adenopathy.  Neurological: She is alert and oriented to person, place, and time.  Skin: Skin is warm and dry. No rash noted. She is not diaphoretic.  Psychiatric: She has a normal mood and affect. Her behavior is normal. Judgment and thought content normal.   Assessment & Plan:   [name] was seen today for other, other and other.  Diagnoses and all orders for this visit:  Acute URI  Other orders -     azithromycin (ZITHROMAX) 250 MG tablet; Take 2 tablets by mouth on day 1, take 1 tablet by mouth each day after for 4 days. -     guaiFENesin-codeine (ROBITUSSIN AC) 100-10 MG/5ML syrup; Take 5 mLs by mouth at bedtime.  I am having [name] start on azithromycin and guaiFENesin-codeine. I am also having her maintain her metoprolol succinate, methocarbamol, furosemide, amLODipine, aspirin, losartan, pioglitazone, repaglinide, Phenyleph-CPM-DM-APAP, and vitamin C.  Meds ordered this encounter  Medications  . azithromycin (ZITHROMAX) 250 MG tablet    Sig: Take 2 tablets by mouth on day 1, take 1 tablet by mouth each day after for 4 days.    Dispense:  6 each    Refill:  0    Order Specific Question:  Supervising Provider    Answer:  Deborra Medina L [[id number]]  . guaiFENesin-codeine (ROBITUSSIN AC) 100-10 MG/5ML syrup    Sig: Take 5 mLs by mouth at bedtime.    Dispense:  180 mL    Refill:  0    Order Specific Question:  Supervising Provider    Answer:  Crecencio Mc [[id number]]     Follow-up: Return if symptoms worsen or fail to improve.

## 2015-11-13 NOTE — Progress Notes (Signed)
Pre visit review using our clinic review tool, if applicable. No additional management support is needed unless otherwise documented below in the visit note. 

## 2015-11-17 ENCOUNTER — Encounter: Payer: Self-pay | Admitting: Nurse Practitioner

## 2015-11-17 DIAGNOSIS — J069 Acute upper respiratory infection, unspecified: Secondary | ICD-10-CM | POA: Insufficient documentation

## 2015-11-17 NOTE — Assessment & Plan Note (Signed)
New onset Will treat conservatively due to probable viral nature Azithromycin sent to pharmacy in case cough syrup not helpful  Cheratussin syrup printed, signed, and given to pt to take to pharmacy  Cautioned on drowsy effects and how much to take FU prn worsening/failure to improve.

## 2015-12-16 ENCOUNTER — Ambulatory Visit (INDEPENDENT_AMBULATORY_CARE_PROVIDER_SITE_OTHER): Payer: Medicare Other | Admitting: Gastroenterology

## 2015-12-16 ENCOUNTER — Encounter: Payer: Self-pay | Admitting: Gastroenterology

## 2015-12-16 VITALS — BP 162/90 | HR 64 | Ht 60.63 in | Wt 128.2 lb

## 2015-12-16 DIAGNOSIS — R197 Diarrhea, unspecified: Secondary | ICD-10-CM

## 2015-12-16 DIAGNOSIS — Z86010 Personal history of colonic polyps: Secondary | ICD-10-CM | POA: Diagnosis not present

## 2015-12-16 NOTE — Patient Instructions (Signed)
Discontinue Pepto bismol.   Thank you for choosing me and [facility].  Pricilla Riffle. Dagoberto Ligas., MD., Marval Regal

## 2015-12-16 NOTE — Progress Notes (Signed)
    History of Present Illness: This is an 82 year old female who is evaluated in the [facility] ED on March 25 for diarrhea and dark stools. She was taking Pepto-Bismol at the time however she states her stools were dark prior to taking Pepto-Bismol however the Pepto-Bismol worsened her dark stools. CBC unremarkable. Stool Hemoccults were negative. After 2-3 days all her symptoms abated and her bowel habits returned to normal with brown stool. She has no gastrointestinal complaints today. She has a prior history of tubulovillous adenomatous colon polyps in 2012. Surveillance colonoscopies were deferred due to age.   Current Medications, Allergies, Past Medical History, Past Surgical History, Family History and Social History were reviewed in [employer] record.  Physical Exam: General: Well developed, well nourished, no acute distress Head: Normocephalic and atraumatic Eyes:  sclerae anicteric, EOMI Ears: Normal auditory acuity Mouth: No deformity or lesions Lungs: Clear throughout to auscultation Heart: Regular rate and rhythm; no murmurs, rubs or bruits Abdomen: Soft, non tender and non distended. No masses, hepatosplenomegaly or hernias noted. Normal Bowel sounds Musculoskeletal: Symmetrical with no gross deformities  Pulses:  Normal pulses noted Extremities: No clubbing, cyanosis, edema or deformities noted Neurological: Alert oriented x 4, grossly nonfocal Psychological:  Alert and cooperative. Normal mood and affect  Assessment and Recommendations:  1. Diarrhea and dark heme-negative stool. Self-limited process such as a food borne illness or gastroenteritis. She's been asymptomatic for the past month. No further evaluation planned at this time. Follow up with PCP and with GI prn.  2. Personal history of tubulovillous adenomas. Given age surveillance colonoscopies have been deferred.

## 2015-12-24 ENCOUNTER — Ambulatory Visit (INDEPENDENT_AMBULATORY_CARE_PROVIDER_SITE_OTHER): Payer: Medicare Other | Admitting: Endocrinology

## 2015-12-24 ENCOUNTER — Encounter: Payer: Self-pay | Admitting: Endocrinology

## 2015-12-24 VITALS — BP 142/87 | HR 74 | Temp 97.4°F | Ht 60.0 in | Wt 130.0 lb

## 2015-12-24 DIAGNOSIS — E1122 Type 2 diabetes mellitus with diabetic chronic kidney disease: Secondary | ICD-10-CM | POA: Diagnosis not present

## 2015-12-24 DIAGNOSIS — N184 Chronic kidney disease, stage 4 (severe): Secondary | ICD-10-CM | POA: Diagnosis not present

## 2015-12-24 DIAGNOSIS — N183 Chronic kidney disease, stage 3 (moderate): Secondary | ICD-10-CM | POA: Diagnosis not present

## 2015-12-24 LAB — POCT GLYCOSYLATED HEMOGLOBIN (HGB A1C): Hemoglobin A1C: 7.4

## 2015-12-24 NOTE — Patient Instructions (Addendum)
check your blood sugar once a day.  vary the time of day when you check, between before the 3 meals, and at bedtime.  also check if you have symptoms of your blood sugar being too high or too low.  please keep a record of the readings and bring it to your next appointment here.  You can write it on any piece of paper.  please call us sooner if your blood sugar goes below 70, or if you have a lot of readings over 200.  Please continue the same diabetes pills.   Please come back for a regular physical appointment in 4 months.

## 2015-12-24 NOTE — Progress Notes (Signed)
Subjective:    Patient ID: [name], female    DOB: 23-Nov-1927, 82 y.o.   MRN: [id number]  HPI Pt returns for f/u of diabetes mellitus: DM type: 2 Dx'ed: 0000000 Complications: polyneuropathy, renal insufficiency, TIA, and CAD.   Therapy: 2 oral meds.  GDM: never.   DKA: never Severe hypoglycemia: never.   Pancreatitis: never. Other: she took insulin 2014-2015; oral rx options are limited by renal insufficiency and her request for generic meds.    Interval history: no cbg record, but states cbg's are well-controlled.  pt states she feels well in general.  She takes prandin only qd, in the evening.   Past Medical History  Diagnosis Date  . Pulmonary embolism ([place]) 02/2009    on anticoag thru 11/2010 - stopped due to risk>benefit  . Hypertension   . CAD (coronary artery disease)     nonobst on cath 2010  . Cardiomyopathy     hypertensive  . Paroxysmal atrial fibrillation ([facility])     not anticoag candidate  . Hypercholesterolemia   . Diabetes mellitus     hx noncompliance with rx'd tx  . Nontoxic multinodular goiter   . Pure hypercholesterolemia   . GERD (gastroesophageal reflux disease)     hx DU with H pylori 01/2009 and GIB, s/p triple rx  . Benign neoplasm of adrenal gland   . Diverticulosis of colon (without mention of hemorrhage)   . Calculus of kidney   . Anxiety   . H. pylori infection   . DJD (degenerative joint disease)   . Hx of subarachnoid hemorrhage 2001  . GI bleed 2012    duoedonal ulcer  . Tubulovillous adenoma of colon 2012    Past Surgical History  Procedure Laterality Date  . Knee surgery      left  . Shoulder surgery      right  . Cataract extraction      Social History   Social History  . Marital Status: Widowed    Spouse Name: N/A  . Number of Children: 1  . Years of Education: N/A   Occupational History  . retired    Social History Main Topics  . Smoking status: Former Smoker -- 0.30 packs/day for 36 years    Types:  Cigarettes    Quit date: 08/18/1983  . Smokeless tobacco: Never Used  . Alcohol Use: 0.6 oz/week    1 Glasses of wine per week     Comment: occ wine, beer during holidays or at social events  . Drug Use: No  . Sexual Activity: Not Currently   Other Topics Concern  . Not on file   Social History Narrative    Current Outpatient Prescriptions on File Prior to Visit  Medication Sig Dispense Refill  . amLODipine (NORVASC) 10 MG tablet Take 10 mg by mouth at bedtime.    . Ascorbic Acid (VITAMIN C) 1000 MG tablet Take 1,000 mg by mouth daily as needed (FOR IMMUNE).    Marland Kitchen aspirin 81 MG tablet Take 81 mg by mouth at bedtime. Reported on 11/09/2015    . furosemide (LASIX) 20 MG tablet Take 1 tablet (20 mg total) by mouth daily. 30 tablet 3  . losartan (COZAAR) 100 MG tablet Take 100 mg by mouth at bedtime.    . methocarbamol (ROBAXIN) 500 MG tablet Take 1 tablet (500 mg total) by mouth every 8 (eight) hours as needed for muscle spasms. 20 tablet 0  . metoprolol succinate (TOPROL-XL) 50 MG 24 hr  tablet Take 1 tablet (50 mg total) by mouth daily. Take with or immediately following a meal. 90 tablet 3  . pioglitazone (ACTOS) 45 MG tablet Take 45 mg by mouth at bedtime.     No current facility-administered medications on file prior to visit.    Allergies  Allergen Reactions  . Statins Other (See Comments)    Muscle Aches  . Lisinopril Cough    REACTION: INTOL to ACE's w/ cough  . Penicillins Itching and Rash    Family History  Problem Relation Age of Onset  . Diabetes Maternal Grandmother   . Heart disease Maternal Aunt     x2  . Lung cancer Mother   . Brain cancer Mother   . Goiter Mother   . Cancer Mother   . Heart disease Mother   . Diabetes Mother   . Colon cancer Neg Hx     BP 142/87 mmHg  Pulse 74  Temp(Src) 97.4 F (36.3 C) (Oral)  Ht 5' (1.524 m)  Wt 130 lb (58.968 kg)  BMI 25.39 kg/m2  SpO2 97%  Review of Systems She denies hypoglycemia.      Objective:    Physical Exam VITAL SIGNS:  See vs page GENERAL: no distress Pulses: dorsalis pedis intact bilat.   MSK: no deformity of the feet CV: no leg edema Skin:  no ulcer on the feet.  normal color and temp on the feet. Neuro: sensation is intact to touch on the feet   A1c=7.4%    Assessment & Plan:  DM: well-controlled, given frail elderly state.    Patient is advised the following: Patient Instructions  check your blood sugar once a day.  vary the time of day when you check, between before the 3 meals, and at bedtime.  also check if you have symptoms of your blood sugar being too high or too low.  please keep a record of the readings and bring it to your next appointment here.  You can write it on any piece of paper.  please call us sooner if your blood sugar goes below 70, or if you have a lot of readings over 200.  Please continue the same diabetes pills.   Please come back for a regular physical appointment in 4 months.

## 2015-12-25 DIAGNOSIS — E119 Type 2 diabetes mellitus without complications: Secondary | ICD-10-CM | POA: Insufficient documentation

## 2015-12-31 ENCOUNTER — Encounter: Payer: Medicare Other | Admitting: Internal Medicine

## 2016-01-15 NOTE — Progress Notes (Signed)
HPI The patient presents for evaluation of cardiomyopathy.   Since I last saw her she has done well.  The patient denies any new symptoms such as chest discomfort, neck or arm discomfort. There has been no new shortness of breath, PND or orthopnea. There have been no reported palpitations, presyncope or syncope.    She does have pain in her leg/buttocks because of a misstep yesterday on her stairs.   Of note she was in the ED in March with black stools but she was not found to have any GI bleeding.  She is seeing her new PCP in the next couple of days.     Allergies  Allergen Reactions  . Statins Other (See Comments)    Muscle Aches  . Lisinopril Cough    REACTION: INTOL to ACE's w/ cough  . Penicillins Itching and Rash    Current Outpatient Prescriptions  Medication Sig Dispense Refill  . amLODipine (NORVASC) 10 MG tablet Take 10 mg by mouth at bedtime.    . Ascorbic Acid (VITAMIN C) 1000 MG tablet Take 1,000 mg by mouth daily as needed (FOR IMMUNE).    Marland Kitchen aspirin 81 MG tablet Take 81 mg by mouth at bedtime. Reported on 11/09/2015    . furosemide (LASIX) 20 MG tablet Take 1 tablet (20 mg total) by mouth daily. 30 tablet 3  . losartan (COZAAR) 100 MG tablet Take 100 mg by mouth at bedtime.    . metoprolol succinate (TOPROL-XL) 50 MG 24 hr tablet Take 1 tablet (50 mg total) by mouth daily. Take with or immediately following a meal. 90 tablet 3  . pioglitazone (ACTOS) 45 MG tablet Take 45 mg by mouth at bedtime.    . repaglinide (PRANDIN) 2 MG tablet Take 2 mg by mouth daily with supper.     No current facility-administered medications for this visit.    Past Medical History  Diagnosis Date  . Pulmonary embolism ([place]) 02/2009    on anticoag thru 11/2010 - stopped due to risk>benefit  . Hypertension   . CAD (coronary artery disease)     nonobst on cath 2010  . Cardiomyopathy     hypertensive  . Paroxysmal atrial fibrillation ([facility])     not anticoag candidate  . Hypercholesterolemia    . Diabetes mellitus     hx noncompliance with rx'd tx  . Nontoxic multinodular goiter   . Pure hypercholesterolemia   . GERD (gastroesophageal reflux disease)     hx DU with H pylori 01/2009 and GIB, s/p triple rx  . Benign neoplasm of adrenal gland   . Diverticulosis of colon (without mention of hemorrhage)   . Calculus of kidney   . Anxiety   . H. pylori infection   . DJD (degenerative joint disease)   . Hx of subarachnoid hemorrhage 2001  . GI bleed 2012    duoedonal ulcer  . Tubulovillous adenoma of colon 2012    Past Surgical History  Procedure Laterality Date  . Knee surgery      left  . Shoulder surgery      right  . Cataract extraction      ROS:  As stated in the HPI and negative for all other systems.  PHYSICAL EXAM BP 142/78 mmHg  Pulse 61  Ht 5' (1.524 m)  Wt 127 lb (57.607 kg)  BMI 24.80 kg/m2  SpO2 94% GENERAL:  Well appearing NECK:  No jugular venous distention, waveform within normal limits, carotid upstroke brisk and symmetric,  no bruits, no thyromegaly LUNGS:  Clear to auscultation bilaterally BACK:  No CVA tenderness CHEST:  Unremarkable HEART:  PMI not displaced or sustained, positive right ventricular lift,S1 and S2 within normal limits, no S3, no S4, no clicks, no rubs, no murmurs ABD:  Flat, positive bowel sounds normal in frequency in pitch, no bruits, no rebound, no guarding, no midline pulsatile mass, no hepatomegaly, no splenomegaly EXT:  2 plus pulses throughout, no edema, no cyanosis no clubbing   ASSESSMENT AND PLAN   CHEST PAIN The patient did have nonobstructive coronary disease on cath in 2010.  Her last perfusion study done last year demonstrated no ischemia.  No further cardiac workup is suggested.  CARDIOMYOPATHY  Her EF was moderately reduced.  This was unchanged from previous on echo last year. I suspect a hypertensive cardiomyopathy. I will continue current therapy.  HYPERTENSION  The blood pressure is at target. No  change in medications is indicated. We will continue with therapeutic lifestyle changes (TLC).  RENAL INSUFFICIENCY  Her last creat was 1.93.  I will defer follow up to Binnie Rail, MD   ECTOPY She is not feeling this.  I did not see atrial fib previously.   She would be high risk for anticoagulation with previous GI bleeding. She's not symptomatic. I will not change her therapy.

## 2016-01-16 ENCOUNTER — Ambulatory Visit (INDEPENDENT_AMBULATORY_CARE_PROVIDER_SITE_OTHER): Payer: Medicare Other | Admitting: Cardiology

## 2016-01-16 ENCOUNTER — Encounter: Payer: Self-pay | Admitting: Cardiology

## 2016-01-16 VITALS — BP 142/78 | HR 61 | Ht 60.0 in | Wt 127.0 lb

## 2016-01-16 DIAGNOSIS — I420 Dilated cardiomyopathy: Secondary | ICD-10-CM

## 2016-01-16 NOTE — Patient Instructions (Signed)
Your physician wants you to follow-up in: 1 Year. You will receive a reminder letter in the mail two months in advance. If you don't receive a letter, please call our office to schedule the follow-up appointment.  

## 2016-01-17 ENCOUNTER — Encounter: Payer: Self-pay | Admitting: Internal Medicine

## 2016-01-17 ENCOUNTER — Ambulatory Visit (INDEPENDENT_AMBULATORY_CARE_PROVIDER_SITE_OTHER): Payer: Medicare Other | Admitting: Internal Medicine

## 2016-01-17 VITALS — BP 152/94 | HR 71 | Temp 97.7°F | Resp 18 | Wt 128.0 lb

## 2016-01-17 DIAGNOSIS — I480 Paroxysmal atrial fibrillation: Secondary | ICD-10-CM

## 2016-01-17 DIAGNOSIS — I10 Essential (primary) hypertension: Secondary | ICD-10-CM

## 2016-01-17 DIAGNOSIS — N183 Chronic kidney disease, stage 3 (moderate): Secondary | ICD-10-CM

## 2016-01-17 DIAGNOSIS — S76312A Strain of muscle, fascia and tendon of the posterior muscle group at thigh level, left thigh, initial encounter: Secondary | ICD-10-CM

## 2016-01-17 DIAGNOSIS — I119 Hypertensive heart disease without heart failure: Secondary | ICD-10-CM | POA: Diagnosis not present

## 2016-01-17 DIAGNOSIS — S76319A Strain of muscle, fascia and tendon of the posterior muscle group at thigh level, unspecified thigh, initial encounter: Secondary | ICD-10-CM | POA: Insufficient documentation

## 2016-01-17 DIAGNOSIS — I43 Cardiomyopathy in diseases classified elsewhere: Principal | ICD-10-CM

## 2016-01-17 DIAGNOSIS — E1122 Type 2 diabetes mellitus with diabetic chronic kidney disease: Secondary | ICD-10-CM

## 2016-01-17 DIAGNOSIS — N184 Chronic kidney disease, stage 4 (severe): Secondary | ICD-10-CM

## 2016-01-17 MED ORDER — METOPROLOL SUCCINATE ER 50 MG PO TB24: 50.0000 mg | ORAL_TABLET | Freq: Every day | ORAL | Status: DC

## 2016-01-17 NOTE — Assessment & Plan Note (Signed)
Lab Results  Component Value Date   HGBA1C 7.4 12/24/2015   Controlled Following with Dr Loanne Drilling Taking actos and prandin Eye exam and foot exam up to date

## 2016-01-17 NOTE — Progress Notes (Signed)
Pre visit review using our clinic review tool, if applicable. No additional management support is needed unless otherwise documented below in the visit note. 

## 2016-01-17 NOTE — Progress Notes (Signed)
Subjective:    Patient ID: [name], female    DOB: May 10, 1928, 82 y.o.   MRN: [id number]  HPI She is here to establish with a new pcp.    She almost fell going up the stairs four days ago.  She was able to grab the rail and her left hip and hamstring got pulled.  She has had pain in the left hamstring since.  Sitting hurts.  She is taking tylenol and it is helping.  It has gotten better. She is unsure if there is any bruising or swelling in the area.    Diabetes: She is taking her medication daily as prescribed. She is compliant with a diabetic diet. She is very active with yard work and house work, but does not exercise outside of that.    Hypertensive cardiomyopathy, Hypertension: She is taking her medication daily. She is compliant with a low sodium diet.  She denies chest pain, palpitations, shortness of breath and regular headaches. She has leg edema but it is controlled with lasix.  She is very active with yard work and house work, but is not exercising regularly.  She does monitor her blood pressure at home - two days ago it was 136/76, 120/67.       Medications and allergies reviewed with patient and updated if appropriate.  Patient Active Problem List   Diagnosis Date Noted  . Diabetes ([place]) 12/25/2015  . Acute URI 11/17/2015  . PAROXYSMAL ATRIAL FIBRILLATION 09/30/2009  . CKD stage 3 due to type 2 diabetes mellitus ([place]) 08/05/2009  . PULMONARY EMBOLISM 07/03/2009  . GOITER, MULTINODULAR 05/11/2009  . PEPTIC ULCER DISEASE, HELICOBACTER PYLORI POSITIVE 03/11/2009  . Coronary atherosclerosis 11/29/2008  . Hypertensive cardiomyopathy ([place]) 11/12/2008  . BENIGN NEOPLASM OF ADRENAL GLAND 06/20/2008  . ANXIETY 06/20/2008  . NEUROPATHY 06/20/2008  . DIVERTICULOSIS OF COLON 06/20/2008  . ARTERIOVENOUS MALFORMATION 06/20/2008  . HYPERCHOLESTEROLEMIA 05/10/2008  . Essential hypertension 05/10/2008  . TRANSIENT ISCHEMIC ATTACK 05/10/2008  . GERD 05/10/2008  .  NEPHROLITHIASIS 05/10/2008    Current Outpatient Prescriptions on File Prior to Visit  Medication Sig Dispense Refill  . amLODipine (NORVASC) 10 MG tablet Take 10 mg by mouth at bedtime.    . Ascorbic Acid (VITAMIN C) 1000 MG tablet Take 1,000 mg by mouth daily as needed (FOR IMMUNE).    Marland Kitchen aspirin 81 MG tablet Take 81 mg by mouth at bedtime. Reported on 11/09/2015    . furosemide (LASIX) 20 MG tablet Take 1 tablet (20 mg total) by mouth daily. 30 tablet 3  . losartan (COZAAR) 100 MG tablet Take 100 mg by mouth at bedtime.    . metoprolol succinate (TOPROL-XL) 50 MG 24 hr tablet Take 1 tablet (50 mg total) by mouth daily. Take with or immediately following a meal. 90 tablet 3  . pioglitazone (ACTOS) 45 MG tablet Take 45 mg by mouth at bedtime.    . repaglinide (PRANDIN) 2 MG tablet Take 2 mg by mouth daily with supper.     No current facility-administered medications on file prior to visit.    Past Medical History  Diagnosis Date  . Pulmonary embolism ([place]) 02/2009    on anticoag thru 11/2010 - stopped due to risk>benefit  . Hypertension   . CAD (coronary artery disease)     nonobst on cath 2010  . Cardiomyopathy     hypertensive  . Paroxysmal atrial fibrillation ([facility])     not anticoag candidate  . Hypercholesterolemia   .  Diabetes mellitus     hx noncompliance with rx'd tx  . Nontoxic multinodular goiter   . Pure hypercholesterolemia   . GERD (gastroesophageal reflux disease)     hx DU with H pylori 01/2009 and GIB, s/p triple rx  . Benign neoplasm of adrenal gland   . Diverticulosis of colon (without mention of hemorrhage)   . Calculus of kidney   . Anxiety   . H. pylori infection   . DJD (degenerative joint disease)   . Hx of subarachnoid hemorrhage 2001  . GI bleed 2012    duoedonal ulcer  . Tubulovillous adenoma of colon 2012    Past Surgical History  Procedure Laterality Date  . Knee surgery      left  . Shoulder surgery      right  . Cataract extraction       Social History   Social History  . Marital Status: Widowed    Spouse Name: N/A  . Number of Children: 1  . Years of Education: N/A   Occupational History  . retired    Social History Main Topics  . Smoking status: Former Smoker -- 0.30 packs/day for 36 years    Types: Cigarettes    Quit date: 08/18/1983  . Smokeless tobacco: Never Used  . Alcohol Use: 0.6 oz/week    1 Glasses of wine per week     Comment: occ wine, beer during holidays or at social events  . Drug Use: No  . Sexual Activity: Not Currently   Other Topics Concern  . Not on file   Social History Narrative    Family History  Problem Relation Age of Onset  . Diabetes Maternal Grandmother   . Heart disease Maternal Aunt     x2  . Lung cancer Mother   . Brain cancer Mother   . Goiter Mother   . Cancer Mother   . Heart disease Mother   . Diabetes Mother   . Colon cancer Neg Hx     Review of Systems  Constitutional: Negative for fever and chills.  Respiratory: Negative for cough, shortness of breath and wheezing.   Cardiovascular: Positive for leg swelling (controlled with lasix). Negative for chest pain and palpitations.  Gastrointestinal: Negative for abdominal pain.       Occ gerd  Neurological: Negative for dizziness, light-headedness, numbness and headaches.       Objective:   Filed Vitals:   01/17/16 1007  BP: 152/94  Pulse: 71  Temp: 97.7 F (36.5 C)  Resp: 18   Filed Weights   01/17/16 1007  Weight: 128 lb (58.06 kg)   Body mass index is 25 kg/(m^2).   Physical Exam Constitutional: Appears well-developed and well-nourished. No distress.  Neck: Neck supple. No tracheal deviation present. No thyromegaly present.  No carotid bruit. No cervical adenopathy.   Cardiovascular: Normal rate, regular rhythm and normal heart sounds.   No murmur heard.  No edema Pulmonary/Chest: Effort normal and breath sounds normal. No respiratory distress. No wheezes.  Extremities: pain with  palpation hamstring on left side     Assessment & Plan:   See Problem List for Assessment and Plan of chronic medical problems.  F/u 6 months

## 2016-01-17 NOTE — Assessment & Plan Note (Addendum)
Following with [facility] Continue current medications Kidney function stable

## 2016-01-17 NOTE — Assessment & Plan Note (Signed)
BP elevated today here but she monitors it at home and it is typically well controlled Continue current medications

## 2016-01-17 NOTE — Assessment & Plan Note (Signed)
Following with cardiology well compensated, no evidence of fluid overload She is not compliant with taking her medication daily - stressed compliance Continue current medications at current doses

## 2016-01-17 NOTE — Assessment & Plan Note (Signed)
No recent afib.

## 2016-01-17 NOTE — Assessment & Plan Note (Signed)
Pain improving in the past 4 days since injuring herself Tylenol is helping - she will continue The pain does not continue to improve she will call or return

## 2016-01-17 NOTE — Patient Instructions (Signed)
   All other Health Maintenance issues reviewed.   All recommended immunizations and age-appropriate screenings are up-to-date or discussed.  No immunizations administered today.   Medications reviewed and updated.  No changes recommended at this time.    Please followup in 6 months   

## 2016-02-25 DIAGNOSIS — E119 Type 2 diabetes mellitus without complications: Secondary | ICD-10-CM | POA: Diagnosis not present

## 2016-02-25 DIAGNOSIS — H43313 Vitreous membranes and strands, bilateral: Secondary | ICD-10-CM | POA: Diagnosis not present

## 2016-02-25 LAB — HM DIABETES EYE EXAM

## 2016-04-28 ENCOUNTER — Encounter: Payer: Medicare Other | Admitting: Endocrinology

## 2016-04-30 ENCOUNTER — Ambulatory Visit (INDEPENDENT_AMBULATORY_CARE_PROVIDER_SITE_OTHER): Payer: Medicare Other | Admitting: Endocrinology

## 2016-04-30 ENCOUNTER — Encounter: Payer: Self-pay | Admitting: Endocrinology

## 2016-04-30 VITALS — BP 140/92 | HR 82 | Ht 61.0 in | Wt 127.0 lb

## 2016-04-30 DIAGNOSIS — N184 Chronic kidney disease, stage 4 (severe): Secondary | ICD-10-CM

## 2016-04-30 DIAGNOSIS — E1122 Type 2 diabetes mellitus with diabetic chronic kidney disease: Secondary | ICD-10-CM | POA: Diagnosis not present

## 2016-04-30 DIAGNOSIS — I10 Essential (primary) hypertension: Secondary | ICD-10-CM | POA: Diagnosis not present

## 2016-04-30 DIAGNOSIS — N200 Calculus of kidney: Secondary | ICD-10-CM

## 2016-04-30 DIAGNOSIS — N183 Chronic kidney disease, stage 3 unspecified: Secondary | ICD-10-CM

## 2016-04-30 DIAGNOSIS — E042 Nontoxic multinodular goiter: Secondary | ICD-10-CM

## 2016-04-30 DIAGNOSIS — E7800 Pure hypercholesterolemia, unspecified: Secondary | ICD-10-CM

## 2016-04-30 DIAGNOSIS — Z780 Asymptomatic menopausal state: Secondary | ICD-10-CM

## 2016-04-30 DIAGNOSIS — G629 Polyneuropathy, unspecified: Secondary | ICD-10-CM

## 2016-04-30 DIAGNOSIS — G589 Mononeuropathy, unspecified: Secondary | ICD-10-CM

## 2016-04-30 LAB — POCT GLYCOSYLATED HEMOGLOBIN (HGB A1C): Hemoglobin A1C: 7.3

## 2016-04-30 NOTE — Progress Notes (Signed)
Subjective:    Patient ID: [name], female    DOB: [birth year], 82 y.o.   MRN: [id number]  HPI Pt returns for f/u of diabetes mellitus: DM type: 2 Dx'ed: 0000000 Complications: polyneuropathy, renal insufficiency, TIA, and CAD.   Therapy: 2 oral meds.  GDM: never.   DKA: never Severe hypoglycemia: never.   Pancreatitis: never. Other: she took insulin 2014-2015; oral rx options are limited by renal insufficiency and her request for generic meds.    Interval history: no cbg record, but states cbg's are well-controlled.   Past Medical History:  Diagnosis Date  . Anxiety   . Benign neoplasm of adrenal gland   . CAD (coronary artery disease)    nonobst on cath 2010  . Calculus of kidney   . Cardiomyopathy    hypertensive  . Diabetes mellitus    hx noncompliance with rx'd tx  . Diverticulosis of colon (without mention of hemorrhage)   . DJD (degenerative joint disease)   . GERD (gastroesophageal reflux disease)    hx DU with H pylori 01/2009 and GIB, s/p triple rx  . GI bleed 2012   duoedonal ulcer  . H. pylori infection   . Hx of subarachnoid hemorrhage 2001  . Hypercholesterolemia   . Hypertension   . Nontoxic multinodular goiter   . Paroxysmal atrial fibrillation ([facility])    not anticoag candidate  . Pulmonary embolism ([place]) 02/2009   on anticoag thru 11/2010 - stopped due to risk>benefit  . Pure hypercholesterolemia   . Tubulovillous adenoma of colon 2012    Past Surgical History:  Procedure Laterality Date  . CATARACT EXTRACTION    . KNEE SURGERY     left  . SHOULDER SURGERY     right    Social History   Social History  . Marital status: Widowed    Spouse name: N/A  . Number of children: 1  . Years of education: N/A   Occupational History  . retired Retired   Social History Main Topics  . Smoking status: Former Smoker    Packs/day: 0.30    Years: 36.00    Types: Cigarettes    Quit date: 08/18/1983  . Smokeless tobacco: Never Used  . Alcohol use  0.6 oz/week    1 Glasses of wine per week     Comment: occ wine, beer during holidays or at social events  . Drug use: No  . Sexual activity: Not Currently   Other Topics Concern  . Not on file   Social History Narrative   Widow, 1 daughter    Current Outpatient Prescriptions on File Prior to Visit  Medication Sig Dispense Refill  . amLODipine (NORVASC) 10 MG tablet Take 10 mg by mouth at bedtime.    . Ascorbic Acid (VITAMIN C) 1000 MG tablet Take 1,000 mg by mouth daily as needed (FOR IMMUNE).    Marland Kitchen aspirin 81 MG tablet Take 81 mg by mouth at bedtime. Reported on 11/09/2015    . furosemide (LASIX) 20 MG tablet Take 1 tablet (20 mg total) by mouth daily. 30 tablet 3  . losartan (COZAAR) 100 MG tablet Take 100 mg by mouth at bedtime.    . metoprolol succinate (TOPROL-XL) 50 MG 24 hr tablet Take 1 tablet (50 mg total) by mouth daily. Take with or immediately following a meal. 90 tablet 3  . pioglitazone (ACTOS) 45 MG tablet Take 45 mg by mouth at bedtime.    . repaglinide (PRANDIN) 2 MG tablet Take  2 mg by mouth daily with supper.     No current facility-administered medications on file prior to visit.     Allergies  Allergen Reactions  . Statins Other (See Comments)    Muscle Aches  . Lisinopril Cough    REACTION: INTOL to ACE's w/ cough  . Penicillins Itching and Rash    Family History  Problem Relation Age of Onset  . Diabetes Maternal Grandmother   . Heart disease Maternal Aunt     x2  . Lung cancer Mother   . Brain cancer Mother   . Goiter Mother   . Cancer Mother   . Heart disease Mother   . Diabetes Mother   . Colon cancer Neg Hx     BP (!) 140/92 (BP Location: Left Arm, Patient Position: Sitting)   Pulse 82   Ht 5\' 1"  (1.549 m)   Wt 127 lb (57.6 kg)   SpO2 95%   BMI 24.00 kg/m    Review of Systems  Constitutional: Negative for fever.  HENT: Negative for hearing loss.   Eyes: Negative for visual disturbance.  Respiratory: Negative for shortness of  breath.   Cardiovascular: Negative for chest pain.  Gastrointestinal: Negative for anal bleeding.  Endocrine: Negative for cold intolerance.  Genitourinary: Negative for hematuria.  Musculoskeletal: Negative for back pain.  Skin: Negative for rash.  Allergic/Immunologic: Positive for environmental allergies.  Neurological: Negative for syncope and numbness.  Hematological: Does not bruise/bleed easily.  Psychiatric/Behavioral: Negative for dysphoric mood.       Objective:   Physical Exam VS: see vs page GEN: no distress HEAD: head: no deformity eyes: no periorbital swelling, no proptosis external nose and ears are normal mouth: no lesion seen NECK: supple, thyroid is not enlarged CHEST WALL: no deformity LUNGS: clear to auscultation CV: reg rate and rhythm, no murmur ABD: abdomen is soft, nontender.  no hepatosplenomegaly.  not distended.  no hernia MUSCULOSKELETAL: muscle bulk and strength are grossly normal.  no obvious joint swelling.  gait is normal and steady EXTEMITIES: no deformity.  no ulcer on the feet.  feet are of normal color and temp.  no edema PULSES: dorsalis pedis intact bilat.  no carotid bruit NEURO:  cn 2-12 grossly intact.   readily moves all 4's.  sensation is intact to touch on the feet SKIN:  Normal texture and temperature.  No rash or suspicious lesion is visible.   NODES:  None palpable at the neck PSYCH: alert, well-oriented.  Does not appear anxious nor depressed.   Lab Results  Component Value Date   HGBA1C 7.3 04/30/2016    Impression:  type 2 DM: this is the best control this pt should aim for, given her advanced age Renal insuff: this limits oral rx options CAD: in this setting, she should avoid hypoglycemia

## 2016-04-30 NOTE — Patient Instructions (Addendum)
check your blood sugar once a day.  vary the time of day when you check, between before the 3 meals, and at bedtime.  also check if you have symptoms of your blood sugar being too high or too low.  please keep a record of the readings and bring it to your next appointment here.  You can write it on any piece of paper.  please call us sooner if your blood sugar goes below 70, or if you have a lot of readings over 200.  Please continue the same diabetes pills.   Please come back for a follow-up appointment in 4 months.

## 2016-05-05 DIAGNOSIS — I10 Essential (primary) hypertension: Secondary | ICD-10-CM | POA: Diagnosis not present

## 2016-05-05 DIAGNOSIS — R809 Proteinuria, unspecified: Secondary | ICD-10-CM | POA: Diagnosis not present

## 2016-05-05 DIAGNOSIS — N2581 Secondary hyperparathyroidism of renal origin: Secondary | ICD-10-CM | POA: Diagnosis not present

## 2016-05-05 DIAGNOSIS — N184 Chronic kidney disease, stage 4 (severe): Secondary | ICD-10-CM | POA: Diagnosis not present

## 2016-05-07 ENCOUNTER — Other Ambulatory Visit: Payer: Self-pay | Admitting: Internal Medicine

## 2016-05-07 ENCOUNTER — Other Ambulatory Visit: Payer: Self-pay | Admitting: Endocrinology

## 2016-05-31 ENCOUNTER — Inpatient Hospital Stay (HOSPITAL_COMMUNITY)
Admission: EM | Admit: 2016-05-31 | Discharge: 2016-06-04 | DRG: 280 | Disposition: A | Discharge status: Home / Self Care | Payer: Medicare Other | Attending: Internal Medicine | Admitting: Internal Medicine

## 2016-05-31 ENCOUNTER — Emergency Department (HOSPITAL_COMMUNITY): Payer: Medicare Other

## 2016-05-31 ENCOUNTER — Encounter (HOSPITAL_COMMUNITY): Payer: Self-pay

## 2016-05-31 DIAGNOSIS — Z833 Family history of diabetes mellitus: Secondary | ICD-10-CM

## 2016-05-31 DIAGNOSIS — R7989 Other specified abnormal findings of blood chemistry: Secondary | ICD-10-CM | POA: Diagnosis present

## 2016-05-31 DIAGNOSIS — I119 Hypertensive heart disease without heart failure: Secondary | ICD-10-CM

## 2016-05-31 DIAGNOSIS — I2510 Atherosclerotic heart disease of native coronary artery without angina pectoris: Secondary | ICD-10-CM | POA: Diagnosis not present

## 2016-05-31 DIAGNOSIS — R001 Bradycardia, unspecified: Secondary | ICD-10-CM | POA: Diagnosis present

## 2016-05-31 DIAGNOSIS — I214 Non-ST elevation (NSTEMI) myocardial infarction: Secondary | ICD-10-CM

## 2016-05-31 DIAGNOSIS — Z79899 Other long term (current) drug therapy: Secondary | ICD-10-CM | POA: Diagnosis not present

## 2016-05-31 DIAGNOSIS — E119 Type 2 diabetes mellitus without complications: Secondary | ICD-10-CM

## 2016-05-31 DIAGNOSIS — M6281 Muscle weakness (generalized): Secondary | ICD-10-CM

## 2016-05-31 DIAGNOSIS — Z8673 Personal history of transient ischemic attack (TIA), and cerebral infarction without residual deficits: Secondary | ICD-10-CM | POA: Diagnosis not present

## 2016-05-31 DIAGNOSIS — Z880 Allergy status to penicillin: Secondary | ICD-10-CM

## 2016-05-31 DIAGNOSIS — Z87891 Personal history of nicotine dependence: Secondary | ICD-10-CM

## 2016-05-31 DIAGNOSIS — R54 Age-related physical debility: Secondary | ICD-10-CM | POA: Diagnosis present

## 2016-05-31 DIAGNOSIS — I43 Cardiomyopathy in diseases classified elsewhere: Secondary | ICD-10-CM | POA: Diagnosis present

## 2016-05-31 DIAGNOSIS — I480 Paroxysmal atrial fibrillation: Secondary | ICD-10-CM | POA: Diagnosis present

## 2016-05-31 DIAGNOSIS — E7800 Pure hypercholesterolemia, unspecified: Secondary | ICD-10-CM | POA: Diagnosis not present

## 2016-05-31 DIAGNOSIS — E1122 Type 2 diabetes mellitus with diabetic chronic kidney disease: Secondary | ICD-10-CM | POA: Diagnosis present

## 2016-05-31 DIAGNOSIS — Z87442 Personal history of urinary calculi: Secondary | ICD-10-CM

## 2016-05-31 DIAGNOSIS — I110 Hypertensive heart disease with heart failure: Secondary | ICD-10-CM | POA: Diagnosis not present

## 2016-05-31 DIAGNOSIS — E785 Hyperlipidemia, unspecified: Secondary | ICD-10-CM | POA: Diagnosis not present

## 2016-05-31 DIAGNOSIS — I5043 Acute on chronic combined systolic (congestive) and diastolic (congestive) heart failure: Secondary | ICD-10-CM | POA: Diagnosis present

## 2016-05-31 DIAGNOSIS — Z8249 Family history of ischemic heart disease and other diseases of the circulatory system: Secondary | ICD-10-CM | POA: Diagnosis not present

## 2016-05-31 DIAGNOSIS — R748 Abnormal levels of other serum enzymes: Secondary | ICD-10-CM | POA: Diagnosis not present

## 2016-05-31 DIAGNOSIS — R000 Tachycardia, unspecified: Secondary | ICD-10-CM | POA: Diagnosis not present

## 2016-05-31 DIAGNOSIS — K219 Gastro-esophageal reflux disease without esophagitis: Secondary | ICD-10-CM | POA: Diagnosis not present

## 2016-05-31 DIAGNOSIS — R079 Chest pain, unspecified: Secondary | ICD-10-CM | POA: Diagnosis not present

## 2016-05-31 DIAGNOSIS — I472 Ventricular tachycardia: Secondary | ICD-10-CM | POA: Diagnosis not present

## 2016-05-31 DIAGNOSIS — Z888 Allergy status to other drugs, medicaments and biological substances status: Secondary | ICD-10-CM

## 2016-05-31 DIAGNOSIS — N184 Chronic kidney disease, stage 4 (severe): Secondary | ICD-10-CM | POA: Diagnosis present

## 2016-05-31 DIAGNOSIS — I447 Left bundle-branch block, unspecified: Secondary | ICD-10-CM | POA: Diagnosis not present

## 2016-05-31 DIAGNOSIS — I340 Nonrheumatic mitral (valve) insufficiency: Secondary | ICD-10-CM | POA: Diagnosis not present

## 2016-05-31 DIAGNOSIS — R6889 Other general symptoms and signs: Secondary | ICD-10-CM

## 2016-05-31 DIAGNOSIS — J810 Acute pulmonary edema: Secondary | ICD-10-CM | POA: Diagnosis not present

## 2016-05-31 DIAGNOSIS — R0789 Other chest pain: Secondary | ICD-10-CM

## 2016-05-31 DIAGNOSIS — Z801 Family history of malignant neoplasm of trachea, bronchus and lung: Secondary | ICD-10-CM

## 2016-05-31 DIAGNOSIS — I5023 Acute on chronic systolic (congestive) heart failure: Secondary | ICD-10-CM | POA: Diagnosis not present

## 2016-05-31 DIAGNOSIS — R778 Other specified abnormalities of plasma proteins: Secondary | ICD-10-CM | POA: Diagnosis present

## 2016-05-31 DIAGNOSIS — Z808 Family history of malignant neoplasm of other organs or systems: Secondary | ICD-10-CM

## 2016-05-31 DIAGNOSIS — N179 Acute kidney failure, unspecified: Secondary | ICD-10-CM | POA: Diagnosis not present

## 2016-05-31 DIAGNOSIS — D696 Thrombocytopenia, unspecified: Secondary | ICD-10-CM | POA: Diagnosis not present

## 2016-05-31 DIAGNOSIS — Z86711 Personal history of pulmonary embolism: Secondary | ICD-10-CM

## 2016-05-31 DIAGNOSIS — I130 Hypertensive heart and chronic kidney disease with heart failure and stage 1 through stage 4 chronic kidney disease, or unspecified chronic kidney disease: Secondary | ICD-10-CM | POA: Diagnosis not present

## 2016-05-31 DIAGNOSIS — I498 Other specified cardiac arrhythmias: Secondary | ICD-10-CM | POA: Diagnosis present

## 2016-05-31 HISTORY — DX: Left bundle-branch block, unspecified: I44.7

## 2016-05-31 LAB — COMPREHENSIVE METABOLIC PANEL
ALBUMIN: 3.3 g/dL — AB (ref 3.5–5.0)
ALK PHOS: 56 U/L (ref 38–126)
ALT: 13 U/L — ABNORMAL LOW (ref 14–54)
ANION GAP: 9 (ref 5–15)
AST: 26 U/L (ref 15–41)
BUN: 29 mg/dL — ABNORMAL HIGH (ref 6–20)
CALCIUM: 9.1 mg/dL (ref 8.9–10.3)
CO2: 22 mmol/L (ref 22–32)
Chloride: 109 mmol/L (ref 101–111)
Creatinine, Ser: 2.15 mg/dL — ABNORMAL HIGH (ref 0.44–1.00)
GFR calc Af Amer: 23 mL/min — ABNORMAL LOW (ref >60)
GFR calc non Af Amer: 20 mL/min — ABNORMAL LOW (ref >60)
GLUCOSE: 197 mg/dL — AB (ref 65–99)
Potassium: 4.4 mmol/L (ref 3.5–5.1)
SODIUM: 140 mmol/L (ref 135–145)
Total Bilirubin: 0.6 mg/dL (ref 0.3–1.2)
Total Protein: 6.6 g/dL (ref 6.5–8.1)

## 2016-05-31 LAB — GLUCOSE, CAPILLARY: Glucose-Capillary: 99 mg/dL (ref 65–99)

## 2016-05-31 LAB — CBC WITH DIFFERENTIAL/PLATELET
BASOS PCT: 0 %
Basophils Absolute: 0 10*3/uL (ref 0.0–0.1)
EOS ABS: 0.1 10*3/uL (ref 0.0–0.7)
Eosinophils Relative: 1 %
HCT: 37.3 % (ref 36.0–46.0)
HEMOGLOBIN: 12.2 g/dL (ref 12.0–15.0)
Lymphocytes Relative: 10 %
Lymphs Abs: 0.7 10*3/uL (ref 0.7–4.0)
MCH: 28.1 pg (ref 26.0–34.0)
MCHC: 32.7 g/dL (ref 30.0–36.0)
MCV: 85.9 fL (ref 78.0–100.0)
MONOS PCT: 5 %
Monocytes Absolute: 0.4 10*3/uL (ref 0.1–1.0)
NEUTROS PCT: 84 %
Neutro Abs: 6.5 10*3/uL (ref 1.7–7.7)
Platelets: 138 10*3/uL — ABNORMAL LOW (ref 150–400)
RBC: 4.34 MIL/uL (ref 3.87–5.11)
RDW: 13.6 % (ref 11.5–15.5)
WBC: 7.7 10*3/uL (ref 4.0–10.5)

## 2016-05-31 LAB — BRAIN NATRIURETIC PEPTIDE: B Natriuretic Peptide: 462.4 pg/mL — ABNORMAL HIGH (ref 0.0–100.0)

## 2016-05-31 LAB — TROPONIN I
TROPONIN I: 1.42 ng/mL — AB (ref <0.03)
Troponin I: 0.21 ng/mL (ref <0.03)
Troponin I: 4.19 ng/mL (ref <0.03)

## 2016-05-31 LAB — PROTIME-INR
INR: 0.92
PROTHROMBIN TIME: 12.3 s (ref 11.4–15.2)

## 2016-05-31 LAB — LACTIC ACID, PLASMA: LACTIC ACID, VENOUS: 1.4 mmol/L (ref 0.5–1.9)

## 2016-05-31 LAB — PROCALCITONIN

## 2016-05-31 MED ORDER — REPAGLINIDE 2 MG PO TABS
2.0000 mg | ORAL_TABLET | Freq: Every day | ORAL | Status: DC
  Filled 2016-05-31: qty 1

## 2016-05-31 MED ORDER — SODIUM CHLORIDE 0.9% FLUSH
3.0000 mL | Freq: Two times a day (BID) | INTRAVENOUS | Status: DC
  Administered 2016-05-31 – 2016-06-04 (×5): 3 mL via INTRAVENOUS

## 2016-05-31 MED ORDER — INSULIN ASPART 100 UNIT/ML ~~LOC~~ SOLN: 0.0000 [IU] | SUBCUTANEOUS | Status: DC

## 2016-05-31 MED ORDER — CEFTRIAXONE SODIUM 1 G IJ SOLR: 1.0000 g | Freq: Once | INTRAMUSCULAR | Status: DC

## 2016-05-31 MED ORDER — CALCITRIOL 0.25 MCG PO CAPS
0.2500 ug | ORAL_CAPSULE | Freq: Every day | ORAL | Status: DC
  Administered 2016-05-31 – 2016-06-04 (×5): 0.25 ug via ORAL
  Filled 2016-05-31 (×5): qty 1

## 2016-05-31 MED ORDER — FUROSEMIDE 10 MG/ML IJ SOLN
40.0000 mg | Freq: Two times a day (BID) | INTRAMUSCULAR | Status: DC
Start: 2016-05-31 — End: 2016-06-01
  Administered 2016-05-31 – 2016-06-01 (×2): 40 mg via INTRAVENOUS
  Filled 2016-05-31 (×2): qty 4

## 2016-05-31 MED ORDER — ENOXAPARIN SODIUM 40 MG/0.4ML ~~LOC~~ SOLN
40.0000 mg | SUBCUTANEOUS | Status: DC
  Administered 2016-05-31: 40 mg via SUBCUTANEOUS
  Filled 2016-05-31: qty 0.4

## 2016-05-31 MED ORDER — INSULIN ASPART 100 UNIT/ML ~~LOC~~ SOLN: 0.0000 [IU] | Freq: Every day | SUBCUTANEOUS | Status: DC

## 2016-05-31 MED ORDER — PIOGLITAZONE HCL 45 MG PO TABS
45.0000 mg | ORAL_TABLET | Freq: Every day | ORAL | Status: DC
  Administered 2016-05-31 – 2016-06-01 (×2): 45 mg via ORAL
  Filled 2016-05-31 (×2): qty 1

## 2016-05-31 MED ORDER — LOSARTAN POTASSIUM 50 MG PO TABS
100.0000 mg | ORAL_TABLET | Freq: Every day | ORAL | Status: DC
  Administered 2016-05-31 – 2016-06-02 (×3): 100 mg via ORAL
  Filled 2016-05-31 (×3): qty 2

## 2016-05-31 MED ORDER — FUROSEMIDE 10 MG/ML IJ SOLN
40.0000 mg | Freq: Once | INTRAMUSCULAR | Status: AC
  Administered 2016-05-31: 40 mg via INTRAVENOUS
  Filled 2016-05-31: qty 4

## 2016-05-31 MED ORDER — VITAMIN C 500 MG PO TABS: 1000.0000 mg | ORAL_TABLET | Freq: Every day | ORAL | Status: DC | PRN

## 2016-05-31 MED ORDER — ONDANSETRON HCL 4 MG/2ML IJ SOLN: 4.0000 mg | Freq: Four times a day (QID) | INTRAMUSCULAR | Status: DC | PRN

## 2016-05-31 MED ORDER — SODIUM CHLORIDE 0.9% FLUSH: 3.0000 mL | INTRAVENOUS | Status: DC | PRN

## 2016-05-31 MED ORDER — AZITHROMYCIN 500 MG IV SOLR
500.0000 mg | Freq: Once | INTRAVENOUS | Status: AC
  Administered 2016-05-31: 500 mg via INTRAVENOUS
  Filled 2016-05-31: qty 500

## 2016-05-31 MED ORDER — AMLODIPINE BESYLATE 10 MG PO TABS
10.0000 mg | ORAL_TABLET | Freq: Every day | ORAL | Status: DC
  Administered 2016-05-31 – 2016-06-03 (×4): 10 mg via ORAL
  Filled 2016-05-31 (×4): qty 1

## 2016-05-31 MED ORDER — ACETAMINOPHEN 325 MG PO TABS: 650.0000 mg | ORAL_TABLET | ORAL | Status: DC | PRN

## 2016-05-31 MED ORDER — SODIUM CHLORIDE 0.9 % IV SOLN: 250.0000 mL | INTRAVENOUS | Status: DC | PRN

## 2016-05-31 MED ORDER — PROMETHAZINE HCL 25 MG/ML IJ SOLN
12.5000 mg | Freq: Once | INTRAMUSCULAR | Status: AC
  Administered 2016-05-31: 12.5 mg via INTRAVENOUS
  Filled 2016-05-31: qty 1

## 2016-05-31 MED ORDER — INSULIN ASPART 100 UNIT/ML ~~LOC~~ SOLN
0.0000 [IU] | Freq: Three times a day (TID) | SUBCUTANEOUS | Status: DC
  Administered 2016-06-03 – 2016-06-04 (×2): 2 [IU] via SUBCUTANEOUS

## 2016-05-31 NOTE — ED Provider Notes (Signed)
[facility] DEPT Provider Note   CSN: [id number] Arrival date & time: 05/31/16  1154     History   Chief Complaint Chief Complaint  Patient presents with  . Chest Pain    HPI [name] is a 82 y.o. female.  Patient presents via EMS with chest pain and shortness of breath. Arrives and C Pap. Patient reports she woke up this morning with left-sided chest pain that is dull and achy and constant. This is associated with shortness of breath. EMS was called and she had worsening chest pain or shortness of breath at church. Patient had nausea with one episode of vomiting. She was also diaphoretic. She is found to be hypertensive 210/120 and placed on nonrebreather and then C Pap. After 2 nitroglycerin, patient's pain is now resolved. She denies any shortness of breath. He denies any cough or fever. Patient reports she's had this pain in the past when "my heart rate is racing.". Patient with history of hypertensive cardiomyopathy, nonobstructive coronary disease by catheterization, remote history of pulmonary embolus and no longer on anticoagulation. She denies any pain in her legs are swelling her legs. No change in her weight.   The history is provided by the patient and the EMS personnel. The history is limited by the condition of the patient.    Past Medical History:  Diagnosis Date  . Anxiety   . Benign neoplasm of adrenal gland   . CAD (coronary artery disease)    nonobst on cath 2010  . Calculus of kidney   . Cardiomyopathy    hypertensive  . Diabetes mellitus    hx noncompliance with rx'd tx  . Diverticulosis of colon (without mention of hemorrhage)   . DJD (degenerative joint disease)   . GERD (gastroesophageal reflux disease)    hx DU with H pylori 01/2009 and GIB, s/p triple rx  . GI bleed 2012   duoedonal ulcer  . H. pylori infection   . Hx of subarachnoid hemorrhage 2001  . Hypercholesterolemia   . Hypertension   . Nontoxic multinodular goiter   .  Paroxysmal atrial fibrillation ([facility])    not anticoag candidate  . Pulmonary embolism ([place]) 02/2009   on anticoag thru 11/2010 - stopped due to risk>benefit  . Pure hypercholesterolemia   . Tubulovillous adenoma of colon 2012    Patient Active Problem List   Diagnosis Date Noted  . Neuropathy ([place]) 04/30/2016  . Asymptomatic menopausal state 04/30/2016  . Pulled hamstring 01/17/2016  . Diabetes ([place]) 12/25/2015  . PAROXYSMAL ATRIAL FIBRILLATION 09/30/2009  . CKD stage 3 due to type 2 diabetes mellitus ([place]) 08/05/2009  . PULMONARY EMBOLISM 07/03/2009  . GOITER, MULTINODULAR 05/11/2009  . PEPTIC ULCER DISEASE, HELICOBACTER PYLORI POSITIVE 03/11/2009  . Coronary atherosclerosis 11/29/2008  . Hypertensive cardiomyopathy ([place]) 11/12/2008  . BENIGN NEOPLASM OF ADRENAL GLAND 06/20/2008  . DIVERTICULOSIS OF COLON 06/20/2008  . ARTERIOVENOUS MALFORMATION 06/20/2008  . HYPERCHOLESTEROLEMIA 05/10/2008  . Essential hypertension 05/10/2008  . TRANSIENT ISCHEMIC ATTACK 05/10/2008  . GERD 05/10/2008  . NEPHROLITHIASIS 05/10/2008    Past Surgical History:  Procedure Laterality Date  . CATARACT EXTRACTION    . KNEE SURGERY     left  . SHOULDER SURGERY     right  . TONSILLECTOMY      OB History    No data available       Home Medications    Prior to Admission medications   Medication Sig Start Date End Date Taking? Authorizing Provider  amLODipine (  NORVASC) 10 MG tablet Take 10 mg by mouth at bedtime.    Historical Provider, MD  Ascorbic Acid (VITAMIN C) 1000 MG tablet Take 1,000 mg by mouth daily as needed (FOR IMMUNE).    Historical Provider, MD  aspirin 81 MG tablet Take 81 mg by mouth at bedtime. Reported on 11/09/2015    Historical Provider, MD  furosemide (LASIX) 20 MG tablet Take 1 tablet (20 mg total) by mouth daily. 05/17/15   Peter M Martinique, MD  losartan (COZAAR) 100 MG tablet Take 100 mg by mouth at bedtime.    Historical Provider, MD  losartan (COZAAR) 100 MG tablet TAKE  1 TABLET (100 MG TOTAL) BY MOUTH DAILY. 05/07/16   Binnie Rail, MD  metoprolol succinate (TOPROL-XL) 50 MG 24 hr tablet Take 1 tablet (50 mg total) by mouth daily. Take with or immediately following a meal. 01/17/16   Binnie Rail, MD  pioglitazone (ACTOS) 45 MG tablet Take 45 mg by mouth at bedtime.    Historical Provider, MD  pioglitazone (ACTOS) 45 MG tablet TAKE 1 TABLET (45 MG TOTAL) BY MOUTH DAILY. 05/07/16   Renato Shin, MD  repaglinide (PRANDIN) 2 MG tablet Take 2 mg by mouth daily with supper.    Historical Provider, MD    Family History Family History  Problem Relation Age of Onset  . Lung cancer Mother   . Brain cancer Mother   . Goiter Mother   . Cancer Mother   . Heart disease Mother   . Diabetes Mother   . Diabetes Maternal Grandmother   . Heart disease Maternal Aunt     x2  . Colon cancer Neg Hx     Social History Social History  Substance Use Topics  . Smoking status: Former Smoker    Packs/day: 0.30    Years: 36.00    Types: Cigarettes    Quit date: 08/18/1983  . Smokeless tobacco: Never Used  . Alcohol use 0.6 oz/week    1 Glasses of wine per week     Comment: occ wine, beer during holidays or at social events     Allergies   Statins; Lisinopril; and Penicillins   Review of Systems Review of Systems  Constitutional: Positive for activity change and appetite change. Negative for fever.  HENT: Negative for congestion and rhinorrhea.   Eyes: Negative for visual disturbance.  Respiratory: Positive for chest tightness and shortness of breath.   Cardiovascular: Positive for chest pain. Negative for leg swelling.  Gastrointestinal: Positive for nausea and vomiting. Negative for abdominal pain.  Genitourinary: Negative for dysuria, hematuria, vaginal bleeding and vaginal discharge.  Musculoskeletal: Negative for arthralgias and myalgias.  Neurological: Negative for dizziness, weakness and headaches.  A complete 10 system review of systems was obtained and  all systems are negative except as noted in the HPI and PMH.     Physical Exam Updated Vital Signs BP 157/90   Pulse 77   Temp 98.5 F (36.9 C) (Oral)   Resp 16   Ht 5\' 2"  (1.575 m)   Wt 120 lb (54.4 kg)   SpO2 95% Comment: on 4 L [place]   BMI 21.95 kg/m   Physical Exam  Constitutional: She is oriented to person, place, and time. She appears well-developed and well-nourished. No distress.  BiPAP removed on arrival, patient speaking in full sentences, no respiratory distress  HENT:  Head: Normocephalic and atraumatic.  Mouth/Throat: Oropharynx is clear and moist. No oropharyngeal exudate.  Eyes: Conjunctivae and EOM are  normal. Pupils are equal, round, and reactive to light.  Neck: Normal range of motion. Neck supple.  No meningismus.  Cardiovascular: Normal rate, regular rhythm, normal heart sounds and intact distal pulses.   No murmur heard. Pulmonary/Chest: Effort normal. No respiratory distress. She has rales.  Bibasilar crackles  Abdominal: Soft. There is no tenderness. There is no rebound and no guarding.  Musculoskeletal: Normal range of motion. She exhibits no edema or tenderness.  No edema  Neurological: She is alert and oriented to person, place, and time. No cranial nerve deficit. She exhibits normal muscle tone. Coordination normal.   5/5 strength throughout. CN 2-12 intact.Equal grip strength.   Skin: Skin is warm.  Psychiatric: She has a normal mood and affect. Her behavior is normal.  Nursing note and vitals reviewed.    ED Treatments / Results  Labs (all labs ordered are listed, but only abnormal results are displayed) Labs Reviewed  CBC WITH DIFFERENTIAL/PLATELET  COMPREHENSIVE METABOLIC PANEL  TROPONIN I  BRAIN NATRIURETIC PEPTIDE  PROTIME-INR    EKG  EKG Interpretation  Date/Time:  Sunday May 31 2016 12:03:52 EDT Ventricular Rate:  76 PR Interval:    QRS Duration: 161 QT Interval:  457 QTC Calculation: 514 R Axis:   -73 Text  Interpretation:  Sinus rhythm Left bundle branch block No significant change was found Confirmed by Wyvonnia Dusky  MD, Sanaz Scarlett [phone]) on 05/31/2016 12:09:19 PM       Radiology Dg Chest Portable 1 View  Result Date: 05/31/2016 CLINICAL DATA:  Chest pain EXAM: PORTABLE CHEST 1 VIEW COMPARISON:  March 22, 2015 FINDINGS: New infiltrate in the right lung base. Stable cardiomegaly. No other interval changes or acute abnormalities. IMPRESSION: New infiltrate in the right lung base, likely pneumonia or aspiration. Recommend follow-up to resolution. Electronically Signed   By: Dorise Bullion III M.D   On: 05/31/2016 12:32    Procedures Procedures (including critical care time)  Medications Ordered in ED Medications - No data to display   Initial Impression / Assessment and Plan / ED Course  I have reviewed the triage vital signs and the nursing notes.  Pertinent labs & imaging results that were available during my care of the patient were reviewed by me and considered in my medical decision making (see chart for details).  Clinical Course   Patient with chest pain or shortness of breath onset about 9 AM this morning. She is given aspirin and nitroglycerin by EMS.  Patient weaned off of BiPAP on arrival. EKG is unchanged left bundle branch block. No STEMI. X-ray shows possible right basilar infiltrate the patient denies any cough or fever. Do not suspect pneumonia.  Last echocardiogram shows EF of 35%. IV Lasix given on arrival. Troponin elevated 0.21. Patient does have history of pulmonary embolism. She is not tachypneic or hypoxic though doubt PE. She has no further chest pain or shortness of breath.  Discussed with Dr. Rayann Heman of cardiology who will consult recommends medical admission. No heparin given her previous history of bleeding.  Admission d/w NP Lissa Merlin.  Patient comfortable on room air, denies any CP or SOB.  Final Clinical Impressions(s) / ED Diagnoses   Final diagnoses:  Acute  on chronic systolic congestive heart failure (HCC)  Elevated troponin    New Prescriptions New Prescriptions   No medications on file     Ezequiel Essex, MD 06/01/16 0800

## 2016-05-31 NOTE — H&P (Signed)
History and Physical    [name] [id number] DOB: 03-Nov-1927 DOA: 05/31/2016   PCP: Binnie Rail, MD   Patient coming from/Resides with: Private residence/lives alone  Admission status: Observation/telemetry -necessary to reevaluate in the next 24 hours to determine if he medically necessary to stay a minimum 2 midnights to rule out impending and/or unexpected changes in physiologic status that may differ from initial evaluation performed in the ER and/or at time of admission. Patient presents with acute respiratory failure out hypoxia with symptoms consistent with CHF exacerbation in the setting of what appears to be symptomatic bradycardia. Patient will require telemetry monitoring, IV diuretics, frequent lab monitoring. Patient also had a positive troponin test at time of admission and was experiencing chest pain relieved with nitroglycerin patient may need to undergo cardiac catheterization versus stress test during this hospitalization according to the cardiology note  Chief Complaint: Shortness of breath and chest pain  HPI: [name] is a 82 y.o. female with medical history significant for nonischemic hypertensive cardiomyopathy with chronic combined systolic and diastolic heart failure, chronic kidney disease stage IV, diabetes on oral agents, dyslipidemia, chronic thrombocytopenia. Patient was at church today when she developed nausea and vomiting and chest pain. Upon EMS arrival she was found to be diaphoretic and short of breath. She was initially placed on BiPAP. She was given sublingual nitroglycerin with resolution of her chest pain. Upon arrival to the ER she had a mildly elevated troponin of 0.21. EKG with chronic left bundle-branch block, BNP elevated at 462. She was not hypoxemic she did not have any leukocytosis and did not have any fever but has a non-dense infiltrate in the right base concerning for either heart failure or infectious etiology. Since arrival  to the ER patient has been noted to have sinus bradycardia with rates decreasing as low as 37 bpm with frequent junctional escape beats while increasing heart rate to a maximum of 47-51 bpm. Cardiology has been consulted by the EDP and have evaluated the patient. Patient has chronic dyspnea on exertion with ambulating for long periods of time but is able to mobilize around her house and perform ADLs without any shortness of breath. Patient denies excessive salt intake, no medication changes, reports her weight varies from 119-120 pounds. She reports poor appetite basically eating one healthy meal per day and states "I'm usually so busy [place] TO EAT".  ED Course:  Vital Signs: BP 156/96   Pulse 67   Temp 98.5 F (36.9 C) (Oral)   Resp 18   Ht 5\' 2"  (1.575 m)   Wt 54.4 kg (120 lb)   SpO2 99%   BMI 21.95 kg/m  PCXR; new infiltrate right lung base  Lab data: Sodium 140, potassium 4.4, CO2 22, BUN 29, creatinine 2.15, glucose 197, albumin 3.3, LFTs not elevated, BNP 462, troponin 0.21, white count 7700 with neutrophils 84% normal absolute neutrophils, hemoglobin 12.2, platelets 138,000, coags normal Medications and treatments: Lasix 40 mg IV 1, Phenergan 12.5 mg IV 1  Review of Systems:  In addition to the HPI above,  No Fever-chills, myalgias or other constitutional symptoms No Headache, changes with Vision or hearing, new weakness, tingling, numbness in any extremity, dizziness, dysarthria or word finding difficulty, gait disturbance or imbalance, tremors or seizure activity No problems swallowing food or Liquids, indigestion/reflux, choking or coughing while eating, abdominal pain with or after eating No Cough or Shortness of Breath, palpitations, orthopnea  No Abdominal pain, melena,hematochezia, dark tarry stools, constipation No dysuria,  malodorous urine, hematuria or flank pain No new skin rashes, lesions, masses or bruises, No new joint pains, aches, swelling or redness No recent  unintentional weight gain or loss No polyuria, polydypsia or polyphagia   Past Medical History:  Diagnosis Date  . Anxiety   . Benign neoplasm of adrenal gland   . CAD (coronary artery disease)    nonobst on cath 2010  . Calculus of kidney   . Cardiomyopathy    hypertensive  . Diabetes mellitus    hx noncompliance with rx'd tx  . Diverticulosis of colon (without mention of hemorrhage)   . DJD (degenerative joint disease)   . GERD (gastroesophageal reflux disease)    hx DU with H pylori 01/2009 and GIB, s/p triple rx  . GI bleed 2012   duoedonal ulcer  . H. pylori infection   . Hx of subarachnoid hemorrhage 2001  . Hypercholesterolemia   . Hypertension   . LBBB (left bundle branch block)   . Nontoxic multinodular goiter   . Paroxysmal atrial fibrillation (HCC)    not anticoag candidate  . Pulmonary embolism ([place]) 02/2009   on anticoag thru 11/2010 - stopped due to risk>benefit  . Pure hypercholesterolemia   . Tubulovillous adenoma of colon 2012    Past Surgical History:  Procedure Laterality Date  . CATARACT EXTRACTION    . KNEE SURGERY     left  . SHOULDER SURGERY     right  . TONSILLECTOMY      Social History   Social History  . Marital status: Widowed    Spouse name: N/A  . Number of children: 1  . Years of education: N/A   Occupational History  . retired Retired   Social History Main Topics  . Smoking status: Former Smoker    Packs/day: 0.30    Years: 36.00    Types: Cigarettes    Quit date: 08/18/1983  . Smokeless tobacco: Never Used  . Alcohol use 0.6 oz/week    1 Glasses of wine per week     Comment: occ wine, beer during holidays or at social events  . Drug use: No  . Sexual activity: Not Currently   Other Topics Concern  . Not on file   Social History Narrative   Widow, 1 daughter    Mobility: Without assistive devices Work history: Retired   Allergies  Allergen Reactions  . Statins Other (See Comments)    Muscle Aches  .  Lisinopril Other (See Comments) and Cough    REACTION: INTOL to ACE's w/ cough  . Penicillins Itching and Rash    Family History  Problem Relation Age of Onset  . Lung cancer Mother   . Brain cancer Mother   . Goiter Mother   . Cancer Mother   . Heart disease Mother   . Diabetes Mother   . Diabetes Maternal Grandmother   . Heart disease Maternal Aunt     x2  . Colon cancer Neg Hx    t   Prior to Admission medications   Medication Sig Start Date End Date Taking? Authorizing Provider  amLODipine (NORVASC) 10 MG tablet Take 10 mg by mouth at bedtime.   Yes Historical Provider, MD  Ascorbic Acid (VITAMIN C) 1000 MG tablet Take 1,000 mg by mouth daily as needed (FOR IMMUNE).   Yes Historical Provider, MD  calcitRIOL (ROCALTROL) 0.25 MCG capsule Take 0.25 mcg by mouth daily. 05/07/16  Yes Historical Provider, MD  furosemide (LASIX) 20 MG tablet Take  1 tablet (20 mg total) by mouth daily. Patient taking differently: Take 20 mg by mouth daily as needed for fluid.  05/17/15  Yes Peter M Martinique, MD  losartan (COZAAR) 100 MG tablet TAKE 1 TABLET (100 MG TOTAL) BY MOUTH DAILY. 05/07/16  Yes Binnie Rail, MD  metoprolol succinate (TOPROL-XL) 50 MG 24 hr tablet Take 1 tablet (50 mg total) by mouth daily. Take with or immediately following a meal. 01/17/16  Yes Binnie Rail, MD  pioglitazone (ACTOS) 45 MG tablet TAKE 1 TABLET (45 MG TOTAL) BY MOUTH DAILY. 05/07/16  Yes Renato Shin, MD  repaglinide (PRANDIN) 2 MG tablet Take 2 mg by mouth daily with supper.   Yes Historical Provider, MD    Physical Exam: Vitals:   05/31/16 1300 05/31/16 1315 05/31/16 1330 05/31/16 1345  BP: 142/74   156/96  Pulse: (!) 56 (!) 52 68 67  Resp: 21 17 13 18   Temp:      TempSrc:      SpO2: 97% 97% 98% 99%  Weight:      Height:          Constitutional: NAD, calm, comfortable Eyes: PERRL, lids and conjunctivae normal ENMT: Mucous membranes are moist. Posterior pharynx clear of any exudate or lesions.Normal  dentition.  Neck: normal, supple, no masses, no thyromegaly Respiratory: clear to auscultation bilaterally, no wheezing, A few fine expiratory crackles throughout and slightly diminished in right base . Normal respiratory effort. No accessory muscle use. Nasal cannula oxygen Cardiovascular: Regular rate and rhythm, no murmurs / rubs / gallops. No extremity edema. 2+ pedal pulses. No carotid bruits.  Abdomen: no tenderness, no masses palpated. No hepatosplenomegaly. Bowel sounds positive.  Musculoskeletal: no clubbing / cyanosis. No joint deformity upper and lower extremities. Good ROM, no contractures. Normal muscle tone.  Skin: no rashes, lesions, ulcers. No induration Neurologic: CN 2-12 grossly intact. Sensation intact, DTR normal. Strength 5/5 x all 4 extremities.  Psychiatric: Normal judgment and insight. Alert and oriented x 3. Normal mood.    Labs on Admission: I have personally reviewed following labs and imaging studies  CBC:  Recent Labs Lab 05/31/16 1215  WBC 7.7  NEUTROABS 6.5  HGB 12.2  HCT 37.3  MCV 85.9  PLT 0000000*   Basic Metabolic Panel:  Recent Labs Lab 05/31/16 1215  NA 140  K 4.4  CL 109  CO2 22  GLUCOSE 197*  BUN 29*  CREATININE 2.15*  CALCIUM 9.1   GFR: Estimated Creatinine Clearance: 14.9 mL/min (by C-G formula based on SCr of 2.15 mg/dL (H)). Liver Function Tests:  Recent Labs Lab 05/31/16 1215  AST 26  ALT 13*  ALKPHOS 56  BILITOT 0.6  PROT 6.6  ALBUMIN 3.3*   No results for input(s): LIPASE, AMYLASE in the last 168 hours. No results for input(s): AMMONIA in the last 168 hours. Coagulation Profile:  Recent Labs Lab 05/31/16 1215  INR 0.92   Cardiac Enzymes:  Recent Labs Lab 05/31/16 1215  TROPONINI 0.21*   BNP (last 3 results) No results for input(s): PROBNP in the last 8760 hours. HbA1C: No results for input(s): HGBA1C in the last 72 hours. CBG: No results for input(s): GLUCAP in the last 168 hours. Lipid Profile: No  results for input(s): CHOL, HDL, LDLCALC, TRIG, CHOLHDL, LDLDIRECT in the last 72 hours. Thyroid Function Tests: No results for input(s): TSH, T4TOTAL, FREET4, T3FREE, THYROIDAB in the last 72 hours. Anemia Panel: No results for input(s): VITAMINB12, FOLATE, FERRITIN, TIBC, IRON, RETICCTPCT in  the last 72 hours. Urine analysis:    Component Value Date/Time   COLORURINE YELLOW 10/09/2013 0346   APPEARANCEUR CLEAR 10/09/2013 0346   LABSPEC 1.011 10/09/2013 0346   PHURINE 7.0 10/09/2013 0346   GLUCOSEU 250 (A) 10/09/2013 0346   HGBUR SMALL (A) 10/09/2013 0346   BILIRUBINUR NEGATIVE 10/09/2013 0346   KETONESUR NEGATIVE 10/09/2013 0346   PROTEINUR 100 (A) 10/09/2013 0346   UROBILINOGEN 0.2 10/09/2013 0346   NITRITE NEGATIVE 10/09/2013 0346   LEUKOCYTESUR TRACE (A) 10/09/2013 0346   Sepsis Labs: @LABRCNTIP (procalcitonin:4,lacticidven:4) )No results found for this or any previous visit (from the past 240 hour(s)).   Radiological Exams on Admission: Dg Chest Portable 1 View  Result Date: 05/31/2016 CLINICAL DATA:  Chest pain EXAM: PORTABLE CHEST 1 VIEW COMPARISON:  March 22, 2015 FINDINGS: New infiltrate in the right lung base. Stable cardiomegaly. No other interval changes or acute abnormalities. IMPRESSION: New infiltrate in the right lung base, likely pneumonia or aspiration. Recommend follow-up to resolution. Electronically Signed   By: Dorise Bullion III M.D   On: 05/31/2016 12:32    EKG: (Independently reviewed) sinus rhythm with ventricular rate 76 bpm, QTC 514 ms in setting of underlying chronic left bundle branch block,  Assessment/Plan Principal Problem:   Acute on chronic combined systolic and diastolic heart failure/Hypertensive cardiomyopathy /Altered mitral regurgitation -Presents with shortness of breath and chest pain with clinical findings consistent for art failure exacerbation: Abnormal chest x-ray, elevated BNP and troponin -Suspect symptomatic bradycardia  contributing (see below) -IV Lasix 3 doses then reevaluate -Repeat echocardiogram; last completed August 2016 with EF 35-40% with grade 2 diastolic dysfunction, moderate concentric hypertrophy and moderate diffuse hypokinesis and moderate mitral regurgitation -Acute heart failure order set initiated -Daily weights and strict I/O -Repeat chest x-ray in a.m. 10/16 -Beta blocker on hold but will continue Cozaar -Some concerns regarding possible pneumonia as etiology to patient's respiratory symptoms and abnormal chest x-ray. Patient without fevers chills cough or leukocytosis at presentation. She did receive a dose of Rocephin and Zithromax in the ER. For completeness of exam we will check blood cultures, Procalcitonin and lactic acid but at this point do not suspect pneumonia.  Active Problems:   Symptomatic bradycardia -Initially patient was in normal sinus rhythm but after arrival has developed recurrent episodes of sinus bradycardia with rates as low as 37 BPM with junctional escape beats -Cardiology aware -Hold beta blocker -If after appropriate washout period for holding beta blocker may need to be considered for pacemaker -Check TSH      LBBB/prolonged QT interval -This is an expected finding with left bundle branch block    CKD (chronic kidney disease), stage IV  -Watch renal function with diuresis    Diabetes mellitus type 2 in nonobese  -Continue Actos for now as well as Prandin but given progressive kidney disease may need to reevaluate safety of these medications in this patient -Monitor for hypoglycemia -Hemoglobin A1c -Sliding-scale insulin    Elevated troponin -Initial troponin 0.21 so we'll continue to cycle -Patient did have chest pain that was responsive to nitroglycerin -Appreciate cardiology consultation -Likely will have ischemic evaluation this admission: Stress test versus cath -Currently chest pain-free -Patient underwent gated nuclear stress in 2016 with  estimated EF 24% with a normal perfusion study and no ST segment deviation during the stress portion but study was deemed high risk due to severely reduced systolic function    HYPERCHOLESTEROLEMIA -Not on statin    Thrombocytopenia  -Chronic in nature and platelets have  typically been greater than 100,000      DVT prophylaxis: Lovenox Code Status: Full Family Communication: Daughter at bedside Disposition Plan: Anticipate discharge back to preadmission home environment once medically stable Consults called: Cardiology/[facility]    Jayah Balthazar L. ANP-BC [facility] Pager [phone]   If 7PM-7AM, please contact night-coverage [email or web address] Password [facility]  05/31/2016, 4:43 PM

## 2016-05-31 NOTE — ED Notes (Signed)
Pt states "this feels like when I had that blood clot in my lung".

## 2016-05-31 NOTE — ED Notes (Signed)
Attempting to page Dr. Aggie Moats.

## 2016-05-31 NOTE — ED Notes (Signed)
D. Rancour at bedside

## 2016-05-31 NOTE — Consult Note (Signed)
CARDIOLOGY CONSULT NOTE   Patient ID: [name] MRN: [id number] DOB/AGE: [birth year] 82 y.o.  Admit date: 05/31/2016  Primary Physician   Binnie Rail, MD Primary Cardiologist   Dr. Percival Spanish Reason for Consultation   CHF Requesting Physician  Dr.   HPI: [name] is a 82 y.o. female with a history of NICM since 2010, hypertension, non obstructive CAD, tachyarrhythmias, PE and DVT on coumadin previously, TIA, who presented for evaluation of chest pain.    Cath 11/2008 showed scattered disease of the LAD, but does not appear to be critical and was 50% after diagonal takeoff, but not likely the source of LV dysfunction. Her cardiomyopathy was felt probably to be hypertension. Last echo 03/2015 showed LVEF of 35-40%, moderate diffuse hypokinesis, grade 2 DD, severely dilated LA, medium-sized atrial septal aneurysm.  Myoview 06/2015 was high risk study due to decreased EF of 24%. No ischemia. 48 hours Monitor 06/2015  showed tachycardia and bradycardia, Ventricular ectopy, atrial ectopy with some nonsustained runs. Some of this is irregular but not clearly fibrillation. She would be high risk for anticoagulation with previous GI bleeding per note. No change in medical therapy.   The patient states that she has being having "exertional pounding sensation along with achy chest tightness"\ at substernal area. This resolves with rest. He is been ongoing for many years. Recently worse. She lives independently by herself and states that is compliant with the medication. This morning around 9 AM patient had a onset of chest pain followed by nausea and vomiting. No diaphoresis or radiation of pain. The pain subsided however reoccurred while at church along with diaphoresis. EMS was called and found to be hypertensive at 210/120. Given aspirin 324 mg and sublingual nitroglycerin x 2 with improvement. Currently blood pressure in 140s/70s. Limited history provided by patient.  Chest x-ray  showed infiltration on right side with likely pneumonia versus aspiration. BNP elevated at 462. Troponin 0.21. Platelets 138. She was given IV Lasix 40 mg x 1. Serum creatinine of 2.15. EKG shows sinus rhythm at rate of 76 bpm and chronic left bundle branch block.    Past Medical History:  Diagnosis Date  . Anxiety   . Benign neoplasm of adrenal gland   . CAD (coronary artery disease)    nonobst on cath 2010  . Calculus of kidney   . Cardiomyopathy    hypertensive  . Diabetes mellitus    hx noncompliance with rx'd tx  . Diverticulosis of colon (without mention of hemorrhage)   . DJD (degenerative joint disease)   . GERD (gastroesophageal reflux disease)    hx DU with H pylori 01/2009 and GIB, s/p triple rx  . GI bleed 2012   duoedonal ulcer  . H. pylori infection   . Hx of subarachnoid hemorrhage 2001  . Hypercholesterolemia   . Hypertension   . Nontoxic multinodular goiter   . Paroxysmal atrial fibrillation (HCC)    not anticoag candidate  . Pulmonary embolism ([place]) 02/2009   on anticoag thru 11/2010 - stopped due to risk>benefit  . Pure hypercholesterolemia   . Tubulovillous adenoma of colon 2012     Past Surgical History:  Procedure Laterality Date  . CATARACT EXTRACTION    . KNEE SURGERY     left  . SHOULDER SURGERY     right  . TONSILLECTOMY      Allergies  Allergen Reactions  . Statins Other (See Comments)    Muscle Aches  . Lisinopril Other (See  Comments) and Cough    REACTION: INTOL to ACE's w/ cough  . Penicillins Itching and Rash    I have reviewed the patient's current medications   . azithromycin (ZITHROMAX) 500 MG IVPB    . cefTRIAXone (ROCEPHIN)  IV       Prior to Admission medications   Medication Sig Start Date End Date Taking? Authorizing Provider  amLODipine (NORVASC) 10 MG tablet Take 10 mg by mouth at bedtime.   Yes Historical Provider, MD  Ascorbic Acid (VITAMIN C) 1000 MG tablet Take 1,000 mg by mouth daily as needed (FOR IMMUNE).    Yes Historical Provider, MD  calcitRIOL (ROCALTROL) 0.25 MCG capsule Take 0.25 mcg by mouth daily. 05/07/16  Yes Historical Provider, MD  furosemide (LASIX) 20 MG tablet Take 1 tablet (20 mg total) by mouth daily. Patient taking differently: Take 20 mg by mouth daily as needed for fluid.  05/17/15  Yes Peter M Martinique, MD  losartan (COZAAR) 100 MG tablet TAKE 1 TABLET (100 MG TOTAL) BY MOUTH DAILY. 05/07/16  Yes Binnie Rail, MD  metoprolol succinate (TOPROL-XL) 50 MG 24 hr tablet Take 1 tablet (50 mg total) by mouth daily. Take with or immediately following a meal. 01/17/16  Yes Binnie Rail, MD  pioglitazone (ACTOS) 45 MG tablet TAKE 1 TABLET (45 MG TOTAL) BY MOUTH DAILY. 05/07/16  Yes Renato Shin, MD  repaglinide (PRANDIN) 2 MG tablet Take 2 mg by mouth daily with supper.   Yes Historical Provider, MD     Social History   Social History  . Marital status: Widowed    Spouse name: N/A  . Number of children: 1  . Years of education: N/A   Occupational History  . retired Retired   Social History Main Topics  . Smoking status: Former Smoker    Packs/day: 0.30    Years: 36.00    Types: Cigarettes    Quit date: 08/18/1983  . Smokeless tobacco: Never Used  . Alcohol use 0.6 oz/week    1 Glasses of wine per week     Comment: occ wine, beer during holidays or at social events  . Drug use: No  . Sexual activity: Not Currently   Other Topics Concern  . Not on file   Social History Narrative   Widow, 1 daughter    Family Status  Relation Status  . Mother Deceased at age 16  . Father Deceased at age 22   cardiovascular hemmorage  . Daughter Alive  . Maternal Grandmother   . Maternal Aunt   . Neg Hx    Family History  Problem Relation Age of Onset  . Lung cancer Mother   . Brain cancer Mother   . Goiter Mother   . Cancer Mother   . Heart disease Mother   . Diabetes Mother   . Diabetes Maternal Grandmother   . Heart disease Maternal Aunt     x2  . Colon cancer Neg Hx       ROS:  Full 14 point review of systems complete and found to be negative unless listed above.  Physical Exam: Blood pressure 156/96, pulse 67, temperature 98.5 F (36.9 C), temperature source Oral, resp. rate 18, height 5\' 2"  (1.575 m), weight 120 lb (54.4 kg), SpO2 99 %.  General: Well developed, well nourished, female in no acute distress Head: Eyes PERRLA, No xanthomas. Normocephalic and atraumatic, oropharynx without edema or exudate.  Lungs: Resp regular and unlabored, Faint bibasilar rales. Heart: RRR no  s3, s4, or murmurs.   Neck: No carotid bruits. No lymphadenopathy.  No JVD. Abdomen: Bowel sounds present, abdomen soft and non-tender without masses or hernias noted. Msk:  No spine or cva tenderness. No weakness, no joint deformities or effusions. Extremities: No clubbing, cyanosis or edema. DP/PT/Radials 2+ and equal bilaterally. Neuro: Alert and oriented X 3. No focal deficits noted. Psych:  Good affect, responds appropriately Skin: No rashes or lesions noted.  Labs:   Lab Results  Component Value Date   WBC 7.7 05/31/2016   HGB 12.2 05/31/2016   HCT 37.3 05/31/2016   MCV 85.9 05/31/2016   PLT 138 (L) 05/31/2016    Recent Labs  05/31/16 1215  INR 0.92    Recent Labs Lab 05/31/16 1215  NA 140  K 4.4  CL 109  CO2 22  BUN 29*  CREATININE 2.15*  CALCIUM 9.1  PROT 6.6  BILITOT 0.6  ALKPHOS 56  ALT 13*  AST 26  GLUCOSE 197*  ALBUMIN 3.3*   Magnesium  Date Value Ref Range Status  04/08/2015 2.2 1.5 - 2.5 mg/dL Final    Recent Labs  05/31/16 1215  TROPONINI 0.21*   No results for input(s): TROPIPOC in the last 72 hours. Pro B Natriuretic peptide (BNP)  Date/Time Value Ref Range Status  11/29/2010 05:20 PM 129.0 (H) 0.0 - 100.0 pg/mL Final  01/17/2010 12:23 PM 154.4 (H) 0.0 - 100.0 pg/mL Final   Lab Results  Component Value Date   CHOL 218 (H) 07/24/2015   HDL 75.00 07/24/2015   LDLCALC 122 (H) 07/24/2015   TRIG 105.0 07/24/2015  Echo: LV  EF: 35% -   40%  ------------------------------------------------------------------- Indications:      Chest pain 786.51.  ------------------------------------------------------------------- History:   PMH:  Pulmonary Embolism.  Coronary artery disease. Cardiomyopathy of unknown etiology.  Risk factors:  Diabetes mellitus.  ------------------------------------------------------------------- Study Conclusions  - Left ventricle: The cavity size was normal. There was moderate   concentric hypertrophy. Systolic function was moderately reduced.   The estimated ejection fraction was in the range of 35% to 40%.   Moderate diffuse hypokinesis. Regional wall motion abnormalities   cannot be excluded. Features are consistent with a pseudonormal   left ventricular filling pattern, with concomitant abnormal   relaxation and increased filling pressure (grade 2 diastolic   dysfunction). - Mitral valve: There was moderate regurgitation. - Left atrium: The atrium was severely dilated. - Atrial septum: There was a medium-sized atrial septal aneurysm.  Cath 11/2008 ANGIOGRAPHIC DATA:  1. Ventriculography done in the RAO projection reveals reduced overall      LV function.  Estimated EF would be in the 30-35% range.  There was      some diastolic mitral regurgitation, but in systole, there does not      appear to be a large amount of MR.  A referral to the echo would be      recommended.  2. On plain fluoroscopy, there is calcification of the proximal      circumflex.  3. The left main is large in caliber and free of critical disease.  4. The LAD courses to the apex.  There is minimal luminal irregularity      in the distal LAD, but there does not appear to be critical      obstruction.  There is a diagonal branch, which bifurcates.  The      superior portion of this branch has probably 40% narrowing and  inferior portion possibly 70% at the bottom, but the lower inferior      branch is  really quite small.  The LAD after the takeoff of the      diagonal branch probably has about 30-40% narrowing.  There is      diffuse luminal irregularity in LAD distal to this, none of this      appears to be critical.  5. The circumflex is calcified proximally.  There is a first marginal      branch without critical narrowing and a large second marginal      branch. The A-V circumflex courses posteriorly.  6. The right coronary artery is somewhat tortuous with segmental      plaquing of about 30% in the midvessel.  The distal vessel      terminates as a posterior descending and posterolateral system.      There is an RV branch proximally.   CONCLUSIONS:  1. Moderate reduction in left ventricular function.  2. Scattered disease of the left anterior descending that does not      appear to be critical with maximum stenosis in the range of about      50% after the diagonal takeoff but not likely the source of left      ventricular dysfunction.   Radiology:  Dg Chest Portable 1 View  Result Date: 05/31/2016 CLINICAL DATA:  Chest pain EXAM: PORTABLE CHEST 1 VIEW COMPARISON:  March 22, 2015 FINDINGS: New infiltrate in the right lung base. Stable cardiomegaly. No other interval changes or acute abnormalities. IMPRESSION: New infiltrate in the right lung base, likely pneumonia or aspiration. Recommend follow-up to resolution. Electronically Signed   By: Dorise Bullion III M.D   On: 05/31/2016 12:32    ASSESSMENT AND PLAN:     1. Chest pain -Based on history seems like patient has a 2 component --> pounding sensation followed by achy chest pain. This been ongoing for many years. Recently  suddenly worsened. Could be due to uncontrolled hypertension versus possible pneumonia. Troponin of 0.21. Continue cycle troponin. Keep NPO after midnight. Depending on trend will make decision about inpatient  vs outpatient stress test vs cath.   2. Nonobstructive CAD - As Above. Last Myoview November  2016 showed no inducible ischemia. It was read as high risk study due to severely reduced systolic function.  3. Nonischemic cardiomyopathy - Last echo 03/2015 showed LVEF of 35-40%, moderate diffuse hypokinesis, grade 2 DD, severely dilated LA, medium-sized atrial septal aneurysm. Suspected hypertensive cardiomyopathy. Update echo.   4. Uncontrolled HTN - BP of 210/120. Currently improved. The patient states that as she is taking her medicine. Concern regarding patient's ability to take medicine. She lives independently by herself. Currently improved.   5. Sinus arrhythmia - Felt ectopic rhythm on monitor 06/2015,  No A. fib per Dr. Percival Spanish. ? Prior hx of PAF. She is high risk for anticoagulation due to prior history of GI bleeding. Monitor on telemetry. Early juctional rhythm on tele. Reviewed monitor reading with Dr. Rayann Heman--? Non sustained afib. Hold BB.    6. CKD, stage III - Followed by [place] [facility]. Unknown baseline.  SignedLeanor Kail, PA 05/31/2016, 2:41 PM Pager [phone]  I have seen, examined the patient, and reviewed the above assessment and plan.  Changes to above are made where necessary.  On exam frail and elderly.  Will follow with hospitalists.  Gentle diuresis and close observation.  Given advanced age, would favor a conservative path.  Co Sign: Jeneen Rinks  Garreth Burnsworth, MD 05/31/2016 4:59 PM

## 2016-05-31 NOTE — ED Triage Notes (Signed)
Per [facility]: Pt arrives on c pap. Around 0900 pt started have nausea and vomiting, and chest pain over left chest. Pt went to church, pain increased and pt developed shortness of breath and became diaphoretic. EMS arrived and found pt hypertensive (210/120), the fire department put on NRB. Rales in lung fields.   PT received 324 ASA and 2 SL nitro, decreased pain from 8 to 1. Pt was unaware that she has a left bundle branch block.   Started out at 91% on NRB, then increased to 98% on the cpap

## 2016-06-01 ENCOUNTER — Observation Stay (HOSPITAL_COMMUNITY): Payer: Medicare Other

## 2016-06-01 ENCOUNTER — Other Ambulatory Visit: Payer: Self-pay

## 2016-06-01 ENCOUNTER — Observation Stay (HOSPITAL_BASED_OUTPATIENT_CLINIC_OR_DEPARTMENT_OTHER): Payer: Medicare Other

## 2016-06-01 DIAGNOSIS — R748 Abnormal levels of other serum enzymes: Secondary | ICD-10-CM | POA: Diagnosis not present

## 2016-06-01 DIAGNOSIS — N184 Chronic kidney disease, stage 4 (severe): Secondary | ICD-10-CM | POA: Diagnosis not present

## 2016-06-01 DIAGNOSIS — I480 Paroxysmal atrial fibrillation: Secondary | ICD-10-CM | POA: Diagnosis present

## 2016-06-01 DIAGNOSIS — I5043 Acute on chronic combined systolic (congestive) and diastolic (congestive) heart failure: Secondary | ICD-10-CM | POA: Diagnosis not present

## 2016-06-01 DIAGNOSIS — Z87891 Personal history of nicotine dependence: Secondary | ICD-10-CM | POA: Diagnosis not present

## 2016-06-01 DIAGNOSIS — E1122 Type 2 diabetes mellitus with diabetic chronic kidney disease: Secondary | ICD-10-CM | POA: Diagnosis present

## 2016-06-01 DIAGNOSIS — I50813 Acute on chronic right heart failure: Secondary | ICD-10-CM | POA: Diagnosis not present

## 2016-06-01 DIAGNOSIS — I509 Heart failure, unspecified: Secondary | ICD-10-CM | POA: Diagnosis not present

## 2016-06-01 DIAGNOSIS — Z86711 Personal history of pulmonary embolism: Secondary | ICD-10-CM | POA: Diagnosis not present

## 2016-06-01 DIAGNOSIS — Z8249 Family history of ischemic heart disease and other diseases of the circulatory system: Secondary | ICD-10-CM | POA: Diagnosis not present

## 2016-06-01 DIAGNOSIS — I2510 Atherosclerotic heart disease of native coronary artery without angina pectoris: Secondary | ICD-10-CM | POA: Diagnosis present

## 2016-06-01 DIAGNOSIS — E119 Type 2 diabetes mellitus without complications: Secondary | ICD-10-CM | POA: Diagnosis not present

## 2016-06-01 DIAGNOSIS — I130 Hypertensive heart and chronic kidney disease with heart failure and stage 1 through stage 4 chronic kidney disease, or unspecified chronic kidney disease: Secondary | ICD-10-CM | POA: Diagnosis not present

## 2016-06-01 DIAGNOSIS — Z8673 Personal history of transient ischemic attack (TIA), and cerebral infarction without residual deficits: Secondary | ICD-10-CM | POA: Diagnosis not present

## 2016-06-01 DIAGNOSIS — I340 Nonrheumatic mitral (valve) insufficiency: Secondary | ICD-10-CM | POA: Diagnosis present

## 2016-06-01 DIAGNOSIS — E785 Hyperlipidemia, unspecified: Secondary | ICD-10-CM | POA: Diagnosis present

## 2016-06-01 DIAGNOSIS — N179 Acute kidney failure, unspecified: Secondary | ICD-10-CM | POA: Diagnosis present

## 2016-06-01 DIAGNOSIS — R001 Bradycardia, unspecified: Secondary | ICD-10-CM | POA: Diagnosis not present

## 2016-06-01 DIAGNOSIS — E7800 Pure hypercholesterolemia, unspecified: Secondary | ICD-10-CM | POA: Diagnosis present

## 2016-06-01 DIAGNOSIS — I447 Left bundle-branch block, unspecified: Secondary | ICD-10-CM | POA: Diagnosis present

## 2016-06-01 DIAGNOSIS — Z79899 Other long term (current) drug therapy: Secondary | ICD-10-CM | POA: Diagnosis not present

## 2016-06-01 DIAGNOSIS — Z87442 Personal history of urinary calculi: Secondary | ICD-10-CM | POA: Diagnosis not present

## 2016-06-01 DIAGNOSIS — I472 Ventricular tachycardia: Secondary | ICD-10-CM | POA: Diagnosis not present

## 2016-06-01 DIAGNOSIS — D696 Thrombocytopenia, unspecified: Secondary | ICD-10-CM | POA: Diagnosis present

## 2016-06-01 DIAGNOSIS — I43 Cardiomyopathy in diseases classified elsewhere: Secondary | ICD-10-CM | POA: Diagnosis present

## 2016-06-01 DIAGNOSIS — K219 Gastro-esophageal reflux disease without esophagitis: Secondary | ICD-10-CM | POA: Diagnosis present

## 2016-06-01 DIAGNOSIS — R54 Age-related physical debility: Secondary | ICD-10-CM | POA: Diagnosis present

## 2016-06-01 DIAGNOSIS — I214 Non-ST elevation (NSTEMI) myocardial infarction: Secondary | ICD-10-CM | POA: Diagnosis not present

## 2016-06-01 LAB — ECHOCARDIOGRAM COMPLETE
CHL CUP MV DEC (S): 216
E/e' ratio: 12.84
EWDT: 216 ms
FS: 16 % — AB (ref 28–44)
Height: 62 in
IV/PV OW: 1.02
LA ID, A-P, ES: 42 mm
LA vol index: 44.5 mL/m2
LA vol: 69.4 mL
LADIAMINDEX: 2.69 cm/m2
LAVOLA4C: 60.9 mL
LDCA: 3.46 cm2
LEFT ATRIUM END SYS DIAM: 42 mm
LV E/e' medial: 12.84
LV PW d: 10 mm — AB (ref 0.6–1.1)
LV TDI E'LATERAL: 4.57
LV TDI E'MEDIAL: 3.26
LVEEAVG: 12.84
LVELAT: 4.57 cm/s
LVOTD: 21 mm
MV pk E vel: 58.7 m/s
MVPKAVEL: 74.2 m/s
RV LATERAL S' VELOCITY: 9.9 cm/s
RV TAPSE: 20.9 mm
Weight: 1979.2 oz

## 2016-06-01 LAB — BASIC METABOLIC PANEL
Anion gap: 9 (ref 5–15)
BUN: 37 mg/dL — AB (ref 6–20)
CALCIUM: 9.1 mg/dL (ref 8.9–10.3)
CO2: 26 mmol/L (ref 22–32)
Chloride: 106 mmol/L (ref 101–111)
Creatinine, Ser: 2.37 mg/dL — ABNORMAL HIGH (ref 0.44–1.00)
GFR calc Af Amer: 20 mL/min — ABNORMAL LOW (ref >60)
GFR, EST NON AFRICAN AMERICAN: 17 mL/min — AB (ref >60)
GLUCOSE: 150 mg/dL — AB (ref 65–99)
Potassium: 3.7 mmol/L (ref 3.5–5.1)
Sodium: 141 mmol/L (ref 135–145)

## 2016-06-01 LAB — TSH: TSH: 0.958 u[IU]/mL (ref 0.350–4.500)

## 2016-06-01 LAB — HEMOGLOBIN A1C
Hgb A1c MFr Bld: 7.5 % — ABNORMAL HIGH (ref 4.8–5.6)
MEAN PLASMA GLUCOSE: 169 mg/dL

## 2016-06-01 LAB — TROPONIN I: Troponin I: 6.3 ng/mL (ref <0.03)

## 2016-06-01 LAB — GLUCOSE, CAPILLARY
GLUCOSE-CAPILLARY: 142 mg/dL — AB (ref 65–99)
Glucose-Capillary: 133 mg/dL — ABNORMAL HIGH (ref 65–99)
Glucose-Capillary: 268 mg/dL — ABNORMAL HIGH (ref 65–99)
Glucose-Capillary: 194 mg/dL — ABNORMAL HIGH (ref 65–99)

## 2016-06-01 LAB — HEPARIN LEVEL (UNFRACTIONATED): Heparin Unfractionated: 0.3 IU/mL (ref 0.30–0.70)

## 2016-06-01 MED ORDER — HEPARIN BOLUS VIA INFUSION
3000.0000 [IU] | Freq: Once | INTRAVENOUS | Status: AC
  Administered 2016-06-01: 3000 [IU] via INTRAVENOUS
  Filled 2016-06-01: qty 3000

## 2016-06-01 MED ORDER — HEPARIN (PORCINE) IN NACL 100-0.45 UNIT/ML-% IJ SOLN
650.0000 [IU]/h | INTRAMUSCULAR | Status: DC
  Administered 2016-06-01: 650 [IU]/h via INTRAVENOUS
  Filled 2016-06-01: qty 250

## 2016-06-01 MED ORDER — HEPARIN (PORCINE) IN NACL 100-0.45 UNIT/ML-% IJ SOLN
700.0000 [IU]/h | INTRAMUSCULAR | Status: DC
  Administered 2016-06-02: 700 [IU]/h via INTRAVENOUS
  Filled 2016-06-01: qty 250

## 2016-06-01 MED ORDER — INSULIN GLARGINE 100 UNIT/ML ~~LOC~~ SOLN
10.0000 [IU] | Freq: Every day | SUBCUTANEOUS | Status: DC
  Administered 2016-06-01 – 2016-06-03 (×3): 10 [IU] via SUBCUTANEOUS
  Filled 2016-06-01 (×4): qty 0.1

## 2016-06-01 NOTE — Progress Notes (Signed)
Dr Freda Munro on unit and in seeing the patient, let him know about her lab results and some initial assessments, will continue to monitor and do complete assessment when MD is finished with patient. Patient denies pain at this time.

## 2016-06-01 NOTE — Progress Notes (Signed)
ANTICOAGULATION CONSULT NOTE - Follow Up Consult  Pharmacy Consult for Heparin Indication: chest pain/ACS  Allergies  Allergen Reactions  . Statins Other (See Comments)    Muscle Aches  . Lisinopril Other (See Comments) and Cough    REACTION: INTOL to ACE's w/ cough  . Penicillins Itching and Rash    Patient Measurements: Height: 5\' 2"  (157.5 cm) Weight: 123 lb 11.2 oz (56.1 kg) IBW/kg (Calculated) : 50.1  Vital Signs: Temp: 98.5 F (36.9 C) (10/16 2107) Temp Source: Oral (10/16 2107) BP: 131/51 (10/16 2107) Pulse Rate: 48 (10/16 2107)  Labs:  Recent Labs  05/31/16 1215 05/31/16 1614 05/31/16 2117 06/01/16 0325 06/01/16 2058  HGB 12.2  --   --   --   --   HCT 37.3  --   --   --   --   PLT 138*  --   --   --   --   LABPROT 12.3  --   --   --   --   INR 0.92  --   --   --   --   HEPARINUNFRC  --   --   --   --  0.30  CREATININE 2.15*  --   --  2.37*  --   TROPONINI 0.21* 1.42* 4.19* 6.30*  --     Estimated Creatinine Clearance: 13.5 mL/min (by C-G formula based on SCr of 2.37 mg/dL (H)).   Medications:  Heparin @ 650 units/hr  Assessment: 88yof started on heparin earlier today for chest pain with positive troponins. Initial heparin level is therapeutic at 0.30 but on the low end of goal. No bleeding.  Goal of Therapy:  Heparin level 0.3-0.7 units/ml Monitor platelets by anticoagulation protocol: Yes   Plan:  1) Increase heparin slightly to 700 units/hr 2) Daily heparin level and CBC  Deboraha Sprang 06/01/2016,9:38 PM

## 2016-06-01 NOTE — Progress Notes (Signed)
ANTICOAGULATION CONSULT NOTE - Initial Consult  Pharmacy Consult for heparin  Indication: chest pain/ACS  Allergies  Allergen Reactions  . Statins Other (See Comments)    Muscle Aches  . Lisinopril Other (See Comments) and Cough    REACTION: INTOL to ACE's w/ cough  . Penicillins Itching and Rash    Patient Measurements: Height: 5\' 2"  (157.5 cm) Weight: 123 lb 11.2 oz (56.1 kg) IBW/kg (Calculated) : 50.1   Vital Signs: Temp: 97.3 F (36.3 C) (10/16 0400) Temp Source: Oral (10/16 0400)  Labs:  Recent Labs  05/31/16 1215 05/31/16 1614 05/31/16 2117 06/01/16 0325  HGB 12.2  --   --   --   HCT 37.3  --   --   --   PLT 138*  --   --   --   LABPROT 12.3  --   --   --   INR 0.92  --   --   --   CREATININE 2.15*  --   --  2.37*  TROPONINI 0.21* 1.42* 4.19* 6.30*    Estimated Creatinine Clearance: 13.5 mL/min (by C-G formula based on SCr of 2.37 mg/dL (H)).   Medical History: Past Medical History:  Diagnosis Date  . Anxiety   . Benign neoplasm of adrenal gland   . CAD (coronary artery disease)    nonobst on cath 2010  . Calculus of kidney   . Cardiomyopathy    hypertensive  . Diabetes mellitus    hx noncompliance with rx'd tx  . Diverticulosis of colon (without mention of hemorrhage)   . DJD (degenerative joint disease)   . GERD (gastroesophageal reflux disease)    hx DU with H pylori 01/2009 and GIB, s/p triple rx  . GI bleed 2012   duoedonal ulcer  . H. pylori infection   . Hx of subarachnoid hemorrhage 2001  . Hypercholesterolemia   . Hypertension   . LBBB (left bundle branch block)   . Nontoxic multinodular goiter   . Paroxysmal atrial fibrillation (HCC)    not anticoag candidate  . Pulmonary embolism ([place]) 02/2009   on anticoag thru 11/2010 - stopped due to risk>benefit  . Pure hypercholesterolemia   . Tubulovillous adenoma of colon 2012    Medications:  Prescriptions Prior to Admission  Medication Sig Dispense Refill Last Dose  . amLODipine  (NORVASC) 10 MG tablet Take 10 mg by mouth at bedtime.   05/30/2016 at Unknown time  . Ascorbic Acid (VITAMIN C) 1000 MG tablet Take 1,000 mg by mouth daily as needed (FOR IMMUNE).   Past Week at Unknown time  . calcitRIOL (ROCALTROL) 0.25 MCG capsule Take 0.25 mcg by mouth daily.   05/30/2016 at Unknown time  . furosemide (LASIX) 20 MG tablet Take 1 tablet (20 mg total) by mouth daily. (Patient taking differently: Take 20 mg by mouth daily as needed for fluid. ) 30 tablet 3 Past Month at Unknown time  . losartan (COZAAR) 100 MG tablet TAKE 1 TABLET (100 MG TOTAL) BY MOUTH DAILY. 90 tablet 2 05/30/2016 at Unknown time  . metoprolol succinate (TOPROL-XL) 50 MG 24 hr tablet Take 1 tablet (50 mg total) by mouth daily. Take with or immediately following a meal. 90 tablet 3 05/31/2016 at 0900  . pioglitazone (ACTOS) 45 MG tablet TAKE 1 TABLET (45 MG TOTAL) BY MOUTH DAILY. 30 tablet 2 05/30/2016 at Unknown time  . repaglinide (PRANDIN) 2 MG tablet Take 2 mg by mouth daily with supper.   05/30/2016 at Unknown time  Scheduled:  . amLODipine  10 mg Oral QHS  . calcitRIOL  0.25 mcg Oral Daily  . cefTRIAXone (ROCEPHIN)  IV  1 g Intravenous Once  . insulin aspart  0-5 Units Subcutaneous QHS  . insulin aspart  0-9 Units Subcutaneous TID WC  . losartan  100 mg Oral Daily  . pioglitazone  45 mg Oral Daily  . repaglinide  2 mg Oral Q supper  . sodium chloride flush  3 mL Intravenous Q12H    Assessment: 82 yo female with CP (hx CAD and nonischemic cardiomyopathy with EF= 35-40%) and elevated troponin (now up to 6.3) to begin heparin per pharmacy. No oral anticoagulants prior to admission. History of thrombocytopenia noted -lovenox 40mg  sq given on 10/15 at ~ 11pm  Goal of Therapy:  Heparin level 0.3-0.7 units/ml Monitor platelets by anticoagulation protocol: Yes   Plan:  -Heparin bolus 3000 units IV followed by 650 units/hr (~12  units/kg/hr) -Heparin level in 8 hours and daily wth CBC daily  Hildred Laser, Pharm D 06/01/2016 12:30 PM

## 2016-06-01 NOTE — Progress Notes (Signed)
  Echocardiogram 2D Echocardiogram has been performed.  [name] 06/01/2016, 3:29 PM

## 2016-06-01 NOTE — Progress Notes (Signed)
PROGRESS NOTE    [name]  [id number] DOB: 04-May-1928 DOA: 05/31/2016 PCP: Binnie Rail, MD   Outpatient Specialists:     Brief Narrative:  [name] is a 82 y.o. female with medical history significant for nonischemic hypertensive cardiomyopathy with chronic combined systolic and diastolic heart failure, chronic kidney disease stage IV, diabetes on oral agents, dyslipidemia, chronic thrombocytopenia. Patient was at church today when she developed nausea and vomiting and chest pain. Upon EMS arrival she was found to be diaphoretic and short of breath. She was initially placed on BiPAP. She was given sublingual nitroglycerin with resolution of her chest pain. Upon arrival to the ER she had a mildly elevated troponin of 0.21. EKG with chronic left bundle-branch block, BNP elevated at 462. She was not hypoxemic she did not have any leukocytosis and did not have any fever but has a non-dense infiltrate in the right base concerning for either heart failure or infectious etiology. Since arrival to the ER patient has been noted to have sinus bradycardia with rates decreasing as low as 37 bpm with frequent junctional escape beats while increasing heart rate to a maximum of 47-51 bpm. Cardiology has been consulted by the EDP and have evaluated the patient. Patient has chronic dyspnea on exertion with ambulating for long periods of time but is able to mobilize around her house and perform ADLs without any shortness of breath. Patient denies excessive salt intake, no medication changes, reports her weight varies from 119-120 pounds. She reports poor appetite basically eating one healthy meal per day and states "I'm usually so busy [place] TO EAT".   Assessment & Plan:   Principal Problem:   Acute on chronic combined systolic and diastolic heart failure (HCC) Active Problems:   HYPERCHOLESTEROLEMIA   Hypertensive cardiomyopathy (HCC)   CKD (chronic kidney disease), stage IV (HCC)  Diabetes mellitus type 2 in nonobese (HCC)   Symptomatic bradycardia   Elevated troponin   Thrombocytopenia (HCC)     Symptomatic bradycardia -Cardiology aware -Hold beta blocker -If after appropriate washout period for holding beta blocker may need to be considered for pacemaker -TSH ok      AKI on CKD (chronic kidney disease), stage IV  -Watch renal function  -d/c IV lasix    Diabetes mellitus type 2 in nonobese  -Monitor for hypoglycemia -Hemoglobin A1c -Sliding-scale insulin -low does lantus while in hospital    Elevated troponin -Patient did have chest pain that was responsive to nitroglycerin -Appreciate cardiology consultation- cath on hold secondary to increased Cr -Patient underwent gated nuclear stress in 2016 with estimated EF 24% with a normal perfusion study and no ST segment deviation during the stress portion but study was deemed high risk due to severely reduced systolic function    HYPERCHOLESTEROLEMIA -Not on statin    Thrombocytopenia  -Chronic in nature and platelets have typically been greater than 100,000      DVT prophylaxis:  Fully anticoagulated   Code Status: Full Code   Family Communication: patient  Disposition Plan:     Consultants:        Subjective: hungry  Objective: Vitals:   05/31/16 1845 05/31/16 2222 06/01/16 0400 06/01/16 1330  BP: 139/95 (!) 118/55  (!) 124/59  Pulse: (!) 57 (!) 46  (!) 48  Resp: 17 15  12   Temp:  98.2 F (36.8 C) 97.3 F (36.3 C) 97.9 F (36.6 C)  TempSrc:  Oral Oral Oral  SpO2: 98% 99%  99%  Weight:  56.1 kg (123 lb 11.2 oz)   Height:        Intake/Output Summary (Last 24 hours) at 06/01/16 1404 Last data filed at 06/01/16 1331  Gross per 24 hour  Intake              530 ml  Output             1180 ml  Net             -650 ml   Filed Weights   05/31/16 1207 06/01/16 0400  Weight: 54.4 kg (120 lb) 56.1 kg (123 lb 11.2 oz)    Examination:  General exam: Appears  calm and comfortable  Respiratory system: Clear to auscultation. Respiratory effort normal. Cardiovascular system: slow Gastrointestinal system: Abdomen is nondistended, soft and nontender. No organomegaly or masses felt. Normal bowel sounds heard. Central nervous system: Alert and oriented. No focal neurological deficits. Extremities: Symmetric 5 x 5 power. Skin: No rashes, lesions or ulcers Psychiatry: Judgement and insight appear normal. Mood & affect appropriate.     Data Reviewed: I have personally reviewed following labs and imaging studies  CBC:  Recent Labs Lab 05/31/16 1215  WBC 7.7  NEUTROABS 6.5  HGB 12.2  HCT 37.3  MCV 85.9  PLT 0000000*   Basic Metabolic Panel:  Recent Labs Lab 05/31/16 1215 06/01/16 0325  NA 140 141  K 4.4 3.7  CL 109 106  CO2 22 26  GLUCOSE 197* 150*  BUN 29* 37*  CREATININE 2.15* 2.37*  CALCIUM 9.1 9.1   GFR: Estimated Creatinine Clearance: 13.5 mL/min (by C-G formula based on SCr of 2.37 mg/dL (H)). Liver Function Tests:  Recent Labs Lab 05/31/16 1215  AST 26  ALT 13*  ALKPHOS 56  BILITOT 0.6  PROT 6.6  ALBUMIN 3.3*   No results for input(s): LIPASE, AMYLASE in the last 168 hours. No results for input(s): AMMONIA in the last 168 hours. Coagulation Profile:  Recent Labs Lab 05/31/16 1215  INR 0.92   Cardiac Enzymes:  Recent Labs Lab 05/31/16 1215 05/31/16 1614 05/31/16 2117 06/01/16 0325  TROPONINI 0.21* 1.42* 4.19* 6.30*   BNP (last 3 results) No results for input(s): PROBNP in the last 8760 hours. HbA1C:  Recent Labs  05/31/16 1614  HGBA1C 7.5*   CBG:  Recent Labs Lab 05/31/16 2224 06/01/16 0738 06/01/16 1337  GLUCAP 99 133* 268*   Lipid Profile: No results for input(s): CHOL, HDL, LDLCALC, TRIG, CHOLHDL, LDLDIRECT in the last 72 hours. Thyroid Function Tests:  Recent Labs  06/01/16 0326  TSH 0.958   Anemia Panel: No results for input(s): VITAMINB12, FOLATE, FERRITIN, TIBC, IRON,  RETICCTPCT in the last 72 hours. Urine analysis:    Component Value Date/Time   COLORURINE YELLOW 10/09/2013 0346   APPEARANCEUR CLEAR 10/09/2013 0346   LABSPEC 1.011 10/09/2013 0346   PHURINE 7.0 10/09/2013 0346   GLUCOSEU 250 (A) 10/09/2013 0346   HGBUR SMALL (A) 10/09/2013 0346   BILIRUBINUR NEGATIVE 10/09/2013 0346   KETONESUR NEGATIVE 10/09/2013 0346   PROTEINUR 100 (A) 10/09/2013 0346   UROBILINOGEN 0.2 10/09/2013 0346   NITRITE NEGATIVE 10/09/2013 0346   LEUKOCYTESUR TRACE (A) 10/09/2013 0346   Sepsis Labs: @LABRCNTIP (procalcitonin:4,lacticidven:4)  )No results found for this or any previous visit (from the past 240 hour(s)).    Anti-infectives    Start     Dose/Rate Route Frequency Ordered Stop   05/31/16 1415  cefTRIAXone (ROCEPHIN) 1 g in dextrose 5 % 50 mL IVPB  1 g 100 mL/hr over 30 Minutes Intravenous  Once 05/31/16 1405     05/31/16 1415  azithromycin (ZITHROMAX) 500 mg in dextrose 5 % 250 mL IVPB     500 mg 250 mL/hr over 60 Minutes Intravenous  Once 05/31/16 1405 05/31/16 1834       Radiology Studies: Dg Chest 2 View  Result Date: 06/01/2016 CLINICAL DATA:  Acute on chronic combined systolic and diastolic heart failure. Atrial fibrillation. Coronary artery disease. EXAM: CHEST  2 VIEW COMPARISON:  05/31/2016 FINDINGS: Cardiomegaly stable improved aeration of both lungs is seen. Both lungs are clear. No evidence of pneumothorax or pleural effusion. IMPRESSION: Mild cardiomegaly.  No active lung disease. Electronically Signed   By: Earle Gell M.D.   On: 06/01/2016 08:02   Dg Chest Portable 1 View  Result Date: 05/31/2016 CLINICAL DATA:  Chest pain EXAM: PORTABLE CHEST 1 VIEW COMPARISON:  March 22, 2015 FINDINGS: New infiltrate in the right lung base. Stable cardiomegaly. No other interval changes or acute abnormalities. IMPRESSION: New infiltrate in the right lung base, likely pneumonia or aspiration. Recommend follow-up to resolution. Electronically  Signed   By: Dorise Bullion III M.D   On: 05/31/2016 12:32        Scheduled Meds: . amLODipine  10 mg Oral QHS  . calcitRIOL  0.25 mcg Oral Daily  . cefTRIAXone (ROCEPHIN)  IV  1 g Intravenous Once  . insulin aspart  0-5 Units Subcutaneous QHS  . insulin aspart  0-9 Units Subcutaneous TID WC  . losartan  100 mg Oral Daily  . pioglitazone  45 mg Oral Daily  . repaglinide  2 mg Oral Q supper  . sodium chloride flush  3 mL Intravenous Q12H   Continuous Infusions: . heparin 650 Units/hr (06/01/16 1356)     LOS: 0 days    Time spent: 35 min    Amesti, DO [facility] Pager [phone]  If 7PM-7AM, please contact night-coverage [email or web address] Password [email or web address] 06/01/2016, 2:04 PM

## 2016-06-01 NOTE — Care Management Note (Signed)
Case Management Note  Patient Details  Name: [name] MRN: [id number] Date of Birth: 15-Jul-1928  Subjective/Objective:   Pt presented for Acute on Chronic CHF. Pt is from home and plan is to return home once stable.                  Action/Plan: CM will continue to monitor for disposition needs.   Expected Discharge Date:                  Expected Discharge Plan:  Home/Self Care  In-House Referral:  NA  Discharge planning Services  CM Consult  Post Acute Care Choice:    Choice offered to:     DME Arranged:    DME Agency:     HH Arranged:    HH Agency:     Status of Service:  In process, will continue to follow  If discussed at Long Length of Stay Meetings, dates discussed:    Additional Comments:  Bethena Roys, RN 06/01/2016, 2:09 PM

## 2016-06-01 NOTE — Progress Notes (Signed)
Text paged MD x2 with episodes of V-tach 1st one was a 6 beat run and 2nd one was a 12 beat run, no orders were received. Patient was asymptomatic, will continue to monitor.

## 2016-06-01 NOTE — Care Management Obs Status (Signed)
[facility] NOTIFICATION   Patient Details  Name: [name] MRN: [id number] Date of Birth: [birth year]   Medicare Observation Status Notification Given:  Yes    Bethena Roys, RN 06/01/2016, 2:09 PM

## 2016-06-01 NOTE — Progress Notes (Signed)
Patient Name: [name] Date of Encounter: 06/01/2016  Primary Cardiologist: Dr. [facility] Problem List     Principal Problem:   Acute on chronic combined systolic and diastolic heart failure ([place]) Active Problems:   HYPERCHOLESTEROLEMIA   Hypertensive cardiomyopathy (HCC)   CKD (chronic kidney disease), stage IV (HCC)   Diabetes mellitus type 2 in nonobese (HCC)   Symptomatic bradycardia   Elevated troponin   Thrombocytopenia [facility])    Patient Profile     [name] is a 82 y.o. female with a history of NICM since 2010, hypertension, non obstructive CAD, tachyarrhythmias, PE and DVT on coumadin previously, TIA, who presented for evaluation of chest pain.  Prior to arrival, EMS was called and found to be hypertensive at 210/120. Given aspirin 324 mg and sublingual nitroglycerin x 2 with improvement. In ED, Chest x-ray showed infiltration on right side with likely pneumonia versus aspiration. BNP elevated at 462. Troponin 0.21. Platelets 138. She was given IV Lasix 40 mg x 1. Serum creatinine of 2.15. EKG showed sinus rhythm at rate of 76 bpm and chronic left bundle branch block. Of note, patient also reports h/o brain aneurysm. She is hesitant to start heparin.    Subjective   Chest pain free this am. No dyspnea. Resting comfortably.   Inpatient Medications    Scheduled Meds: . amLODipine  10 mg Oral QHS  . calcitRIOL  0.25 mcg Oral Daily  . cefTRIAXone (ROCEPHIN)  IV  1 g Intravenous Once  . furosemide  40 mg Intravenous Q12H  . insulin aspart  0-5 Units Subcutaneous QHS  . insulin aspart  0-9 Units Subcutaneous TID WC  . losartan  100 mg Oral Daily  . pioglitazone  45 mg Oral Daily  . repaglinide  2 mg Oral Q supper  . sodium chloride flush  3 mL Intravenous Q12H   Continuous Infusions:   PRN Meds: sodium chloride, acetaminophen, ondansetron (ZOFRAN) IV, sodium chloride flush, vitamin C   Vital Signs    Vitals:   05/31/16 1830 05/31/16  1845 05/31/16 2222 06/01/16 0400  BP: 139/85 139/95 (!) 118/55   Pulse: 69 (!) 57 (!) 46   Resp: 21 17 15    Temp:   98.2 F (36.8 C) 97.3 F (36.3 C)  TempSrc:   Oral Oral  SpO2:  98% 99%   Weight:    123 lb 11.2 oz (56.1 kg)  Height:        Intake/Output Summary (Last 24 hours) at 06/01/16 0831 Last data filed at 06/01/16 0558  Gross per 24 hour  Intake              290 ml  Output              680 ml  Net             -390 ml   Filed Weights   05/31/16 1207 06/01/16 0400  Weight: 120 lb (54.4 kg) 123 lb 11.2 oz (56.1 kg)    Physical Exam    GEN: Well nourished, well developed, in no acute distress, elderly HEENT: Grossly normal.  Neck: Supple, no JVD, carotid bruits, or masses. Cardiac: RRR, no murmurs, rubs, or gallops. No clubbing, cyanosis, edema.  Radials/DP/PT 2+ and equal bilaterally.  Respiratory:  Respirations regular and unlabored, clear to auscultation bilaterally. GI: Soft, nontender, nondistended, BS + x 4. MS: no deformity or atrophy. Skin: warm and dry, no rash. Neuro:  Strength and sensation are intact.  Psych: AAOx3.  Normal affect.  Labs    CBC  Recent Labs  05/31/16 1215  WBC 7.7  NEUTROABS 6.5  HGB 12.2  HCT 37.3  MCV 85.9  PLT 0000000*   Basic Metabolic Panel  Recent Labs  05/31/16 1215 06/01/16 0325  NA 140 141  K 4.4 3.7  CL 109 106  CO2 22 26  GLUCOSE 197* 150*  BUN 29* 37*  CREATININE 2.15* 2.37*  CALCIUM 9.1 9.1   Liver Function Tests  Recent Labs  05/31/16 1215  AST 26  ALT 13*  ALKPHOS 56  BILITOT 0.6  PROT 6.6  ALBUMIN 3.3*   No results for input(s): LIPASE, AMYLASE in the last 72 hours. Cardiac Enzymes  Recent Labs  05/31/16 1614 05/31/16 2117 06/01/16 0325  TROPONINI 1.42* 4.19* 6.30*   BNP Invalid input(s): POCBNP D-Dimer No results for input(s): DDIMER in the last 72 hours. Hemoglobin A1C No results for input(s): HGBA1C in the last 72 hours. Fasting Lipid Panel No results for input(s): CHOL,  HDL, LDLCALC, TRIG, CHOLHDL, LDLDIRECT in the last 72 hours. Thyroid Function Tests  Recent Labs  06/01/16 0326  TSH 0.958    Telemetry    Sinus bradycardia, rate in the mid 40s - Personally Reviewed  ECG    Sinus bradycardia. 46 bpm, LBBB- Personally Reviewed  Radiology    Dg Chest 2 View  Result Date: 06/01/2016 CLINICAL DATA:  Acute on chronic combined systolic and diastolic heart failure. Atrial fibrillation. Coronary artery disease. EXAM: CHEST  2 VIEW COMPARISON:  05/31/2016 FINDINGS: Cardiomegaly stable improved aeration of both lungs is seen. Both lungs are clear. No evidence of pneumothorax or pleural effusion. IMPRESSION: Mild cardiomegaly.  No active lung disease. Electronically Signed   By: Earle Gell M.D.   On: 06/01/2016 08:02   Dg Chest Portable 1 View  Result Date: 05/31/2016 CLINICAL DATA:  Chest pain EXAM: PORTABLE CHEST 1 VIEW COMPARISON:  March 22, 2015 FINDINGS: New infiltrate in the right lung base. Stable cardiomegaly. No other interval changes or acute abnormalities. IMPRESSION: New infiltrate in the right lung base, likely pneumonia or aspiration. Recommend follow-up to resolution. Electronically Signed   By: Dorise Bullion III M.D   On: 05/31/2016 12:32    Cardiac Studies   Cardiac Panel (last 3 results)  Recent Labs  05/31/16 1614 05/31/16 2117 06/01/16 0325  TROPONINI 1.42* 4.19* 6.30*    Patient Profile     [name] is a 82 y.o. female with a history of NICM since 2010, hypertension, non obstructive CAD, tachyarrhythmias, PE and DVT on coumadin previously, TIA, who presented for evaluation of chest pain.  Prior to arrival, EMS was called and found to be hypertensive at 210/120. Given aspirin 324 mg and sublingual nitroglycerin x 2 with improvement. In ED, Chest x-ray showed infiltration on right side with likely pneumonia versus aspiration. BNP elevated at 462. Troponin 0.21. Platelets 138. She was given IV Lasix 40 mg x 1. Serum  creatinine of 2.15. EKG showed sinus rhythm at rate of 76 bpm and chronic left bundle branch block. Of note, patient also reports h/o brain aneurysm. She is hesitant to start heparin.    Assessment & Plan    1. Chest pain -Based on history seems like patient has a 2 component --> pounding sensation followed by achy chest pain. This been ongoing for many years. Recently  suddenly worsened. Could be due to uncontrolled hypertension versus possible pneumonia. Significant Troponin rise 0.21>>1.42>>4.19>>6.30. Pt reports h/o brain  aneurysm and is hesitant to start IV heparin. She is CP free currently. No dyspnea. Echo pending to assess LVF and wall motion. MD to assess to determine plan of care. ? Medical management vs further ischemic w/u. She does have CKD with SCr at 2.37. She would be at risk for worsening renal function if cath is pursued.   2. H/o Nonobstructive CAD Cath 11/2008 showed scattered disease of the LAD, but did not appear to be critical and was 50% after diagonal takeoff.  Last Myoview November 2016 showed no inducible ischemia. It was read as high risk study due to severely reduced systolic function. Now with recent CP and elevated troponins with significant rise in levels. Possibility of progressive CAD, causing coronary ischemia. Continue medical management until decision is reached by MD regarding further w/u. No BB given bradycardia.   3. Nonischemic cardiomyopathy - Last echo 03/2015 showed LVEF of 35-40%, moderate diffuse hypokinesis, grade 2 DD, severely dilated LA, medium-sized atrial septal aneurysm. Suspected hypertensive cardiomyopathy. Repeat echo pending.   4. Uncontrolled HTN  - BP of 210/120 per EMS. Currently improved. The patient states that as she is taking her medicine. Concern regarding patient's ability to take medicine. She lives independently by herself. Continue amlodipine and lasix.   5. Sinus arrhythmia - Felt ectopic rhythm on monitor 06/2015,  No A. fib  per Dr. Percival Spanish. ? Prior hx of PAF. She is high risk for anticoagulation due to prior history of GI bleeding. Monitor on telemetry. Early juctional rhythm on tele. Reviewed monitor reading with Dr. Rayann Heman--? Non sustained afib. Hold BB given bradycardia.    6. CKD, stage III - Followed by [place] [facility]. Unknown baseline. SCr 2.37 today.   7. Sinus Bradycardia: HR in the mid 40s. She is asymptomatic. No AVN blocking agents. Monitor on telemetry. ? EP consult.   Signed, Lyda Jester, PA-C  06/01/2016, 8:31 AM   I have examined the patient and reviewed assessment and plan and discussed with patient.  Agree with above as stated.  Sx come on with rapid palpitations.  Otherwise, she has no chest discomfort.  Troponin increasing.  WOuld normally cath but since her Cr is rising will hold off.  IV heparin for now,.  Subarachnoid hemorrhage in 2001.  She did tolerate coumadin back in 2010.  Monitor troponins and renal function.  Consider cath if Cr improves.  Marked braycardia. Holding beta blocker.  If tachyarrhythmia noted, may need to consider pacer.   Larae Grooms

## 2016-06-01 NOTE — Progress Notes (Signed)
Report received via [facility], patient is still in the ED but will be coming up shortly, reviewed reason for admission, PMH, VS, meds, labs and patient's general condition, will take responsibility of patient upon arrival to floor.

## 2016-06-02 LAB — TROPONIN I: Troponin I: 6.43 ng/mL (ref <0.03)

## 2016-06-02 LAB — CBC
HCT: 35.9 % — ABNORMAL LOW (ref 36.0–46.0)
HEMOGLOBIN: 11.6 g/dL — AB (ref 12.0–15.0)
MCH: 27.6 pg (ref 26.0–34.0)
MCHC: 32.3 g/dL (ref 30.0–36.0)
MCV: 85.5 fL (ref 78.0–100.0)
Platelets: 126 10*3/uL — ABNORMAL LOW (ref 150–400)
RBC: 4.2 MIL/uL (ref 3.87–5.11)
RDW: 13.5 % (ref 11.5–15.5)
WBC: 5.3 10*3/uL (ref 4.0–10.5)

## 2016-06-02 LAB — BASIC METABOLIC PANEL
ANION GAP: 9 (ref 5–15)
BUN: 45 mg/dL — AB (ref 6–20)
CO2: 26 mmol/L (ref 22–32)
Calcium: 9.1 mg/dL (ref 8.9–10.3)
Chloride: 105 mmol/L (ref 101–111)
Creatinine, Ser: 2.85 mg/dL — ABNORMAL HIGH (ref 0.44–1.00)
GFR calc Af Amer: 16 mL/min — ABNORMAL LOW (ref >60)
GFR, EST NON AFRICAN AMERICAN: 14 mL/min — AB (ref >60)
GLUCOSE: 121 mg/dL — AB (ref 65–99)
POTASSIUM: 3.8 mmol/L (ref 3.5–5.1)
Sodium: 140 mmol/L (ref 135–145)

## 2016-06-02 LAB — GLUCOSE, CAPILLARY
GLUCOSE-CAPILLARY: 163 mg/dL — AB (ref 65–99)
GLUCOSE-CAPILLARY: 141 mg/dL — AB (ref 65–99)
Glucose-Capillary: 107 mg/dL — ABNORMAL HIGH (ref 65–99)
Glucose-Capillary: 148 mg/dL — ABNORMAL HIGH (ref 65–99)

## 2016-06-02 LAB — PROCALCITONIN

## 2016-06-02 LAB — HEPARIN LEVEL (UNFRACTIONATED): Heparin Unfractionated: 0.34 IU/mL (ref 0.30–0.70)

## 2016-06-02 NOTE — Plan of Care (Signed)
Problem: Education: Goal: Knowledge of [facility] Education information/materials will improve Outcome: Progressing Reviewed with patient reason for admission, unit policies, safety issues and Tests and procedures they she may be experiencing while in the hospital, questions answered and will continue to monitor.

## 2016-06-02 NOTE — Progress Notes (Signed)
Patient's daughter expressed concern for her mother since being placed on a Heparin drip earlier this evening. Daughter states that he mom said that she has been getting twinges to sharp pain unde ear and in her neck area sometimes when turning certain ways and that it has been going on for awhile. She is Very concerned d/t mom's HX of GIB and brain aneursym, reviewed signs of concern with patient to call me for and will continue to monitor and address as needed. MD's are aware of her HX and feel the the benefits of her being anticoagulated will outweigh the risks and reviewed that with her and my patient.

## 2016-06-02 NOTE — Progress Notes (Signed)
Updated report received in patient's room via [facility], reviewed POC, new orders, VS, meds and events of the day assumed care of patient.

## 2016-06-02 NOTE — Progress Notes (Signed)
PROGRESS NOTE    [name]  [id number] DOB: [birth year] DOA: 05/31/2016 PCP: Binnie Rail, MD   Outpatient Specialists:     Brief Narrative:  [name] is a 82 y.o. female with medical history significant for nonischemic hypertensive cardiomyopathy with chronic combined systolic and diastolic heart failure, chronic kidney disease stage IV, diabetes on oral agents, dyslipidemia, chronic thrombocytopenia. Patient was at church today when she developed nausea and vomiting and chest pain. Upon EMS arrival she was found to be diaphoretic and short of breath. She was initially placed on BiPAP. She was given sublingual nitroglycerin with resolution of her chest pain. Upon arrival to the ER she had a mildly elevated troponin of 0.21. EKG with chronic left bundle-branch block, BNP elevated at 462. She was not hypoxemic she did not have any leukocytosis and did not have any fever but has a non-dense infiltrate in the right base concerning for either heart failure or infectious etiology. Since arrival to the ER patient has been noted to have sinus bradycardia with rates decreasing as low as 37 bpm with frequent junctional escape beats while increasing heart rate to a maximum of 47-51 bpm. Cardiology has been consulted by the EDP and have evaluated the patient. Patient has chronic dyspnea on exertion with ambulating for long periods of time but is able to mobilize around her house and perform ADLs without any shortness of breath. Patient denies excessive salt intake, no medication changes, reports her weight varies from 119-120 pounds. She reports poor appetite basically eating one healthy meal per day.   Assessment & Plan:   Principal Problem:   Acute on chronic combined systolic and diastolic heart failure (HCC) Active Problems:   HYPERCHOLESTEROLEMIA   Hypertensive cardiomyopathy (HCC)   CKD (chronic kidney disease), stage IV (HCC)   Diabetes mellitus type 2 in nonobese (HCC)  Symptomatic bradycardia   Elevated troponin   Thrombocytopenia (HCC)     Symptomatic bradycardia -Cardiology watching -Hold beta blocker -TSH ok      AKI on CKD (chronic kidney disease), stage IV  -Watch renal function  -d/c IV lasix (got 3 doses of 40 mg IV lasix)    Diabetes mellitus type 2 in nonobese  -Monitor for hypoglycemia -Hemoglobin A1c -Sliding-scale insulin -low does lantus while in hospital    Elevated troponin/NSTEMI -Patient did have chest pain that was responsive to nitroglycerin -Appreciate cardiology consultation- cath on hold secondary to increased Cr -Patient underwent gated nuclear stress in 2016 with estimated EF 24% with a normal perfusion study and no ST segment deviation during the stress portion but study was deemed high risk due to severely reduced systolic function    HYPERCHOLESTEROLEMIA -Not on statin    Thrombocytopenia  -Chronic in nature and platelets have typically been greater than 100,000      DVT prophylaxis:  Fully anticoagulated   Code Status: Full Code   Family Communication: patient  Disposition Plan:     Consultants:        Subjective: Feeling better  Objective: Vitals:   06/01/16 2107 06/02/16 0526 06/02/16 0750 06/02/16 1200  BP: (!) 131/51 (!) 145/96 (!) 150/72 125/81  Pulse: (!) 48 85 (!) 57 (!) 54  Resp: 15 (!) 26 15 14   Temp: 98.5 F (36.9 C) 98.4 F (36.9 C) 98.6 F (37 C)   TempSrc: Oral Oral Oral   SpO2: 100% 100% 96% 100%  Weight:  56.8 kg (125 lb 4.8 oz)    Height:  Intake/Output Summary (Last 24 hours) at 06/02/16 1255 Last data filed at 06/02/16 [id number]  Gross per 24 hour  Intake          1230.25 ml  Output             1250 ml  Net           -19.75 ml   Filed Weights   05/31/16 1207 06/01/16 0400 06/02/16 0526  Weight: 54.4 kg (120 lb) 56.1 kg (123 lb 11.2 oz) 56.8 kg (125 lb 4.8 oz)    Examination:  General exam: Appears calm and comfortable  Respiratory system:  Clear to auscultation. Respiratory effort normal. Cardiovascular system: slow Gastrointestinal system: Abdomen is nondistended, soft and nontender. No organomegaly or masses felt. Normal bowel sounds heard. Central nervous system: Alert and oriented. No focal neurological deficits.     Data Reviewed: I have personally reviewed following labs and imaging studies  CBC:  Recent Labs Lab 05/31/16 1215 06/02/16 0552  WBC 7.7 5.3  NEUTROABS 6.5  --   HGB 12.2 11.6*  HCT 37.3 35.9*  MCV 85.9 85.5  PLT 138* 123XX123*   Basic Metabolic Panel:  Recent Labs Lab 05/31/16 1215 06/01/16 0325 06/02/16 0552  NA 140 141 140  K 4.4 3.7 3.8  CL 109 106 105  CO2 22 26 26   GLUCOSE 197* 150* 121*  BUN 29* 37* 45*  CREATININE 2.15* 2.37* 2.85*  CALCIUM 9.1 9.1 9.1   GFR: Estimated Creatinine Clearance: 11.2 mL/min (by C-G formula based on SCr of 2.85 mg/dL (H)). Liver Function Tests:  Recent Labs Lab 05/31/16 1215  AST 26  ALT 13*  ALKPHOS 56  BILITOT 0.6  PROT 6.6  ALBUMIN 3.3*   No results for input(s): LIPASE, AMYLASE in the last 168 hours. No results for input(s): AMMONIA in the last 168 hours. Coagulation Profile:  Recent Labs Lab 05/31/16 1215  INR 0.92   Cardiac Enzymes:  Recent Labs Lab 05/31/16 1215 05/31/16 1614 05/31/16 2117 06/01/16 0325 06/02/16 0552  TROPONINI 0.21* 1.42* 4.19* 6.30* 6.43*   BNP (last 3 results) No results for input(s): PROBNP in the last 8760 hours. HbA1C:  Recent Labs  05/31/16 1614  HGBA1C 7.5*   CBG:  Recent Labs Lab 06/01/16 1337 06/01/16 1616 06/01/16 2110 06/02/16 0800 06/02/16 1131  GLUCAP 268* 194* 142* 107* 148*   Lipid Profile: No results for input(s): CHOL, HDL, LDLCALC, TRIG, CHOLHDL, LDLDIRECT in the last 72 hours. Thyroid Function Tests:  Recent Labs  06/01/16 0326  TSH 0.958   Anemia Panel: No results for input(s): VITAMINB12, FOLATE, FERRITIN, TIBC, IRON, RETICCTPCT in the last 72 hours. Urine  analysis:    Component Value Date/Time   COLORURINE YELLOW 10/09/2013 0346   APPEARANCEUR CLEAR 10/09/2013 0346   LABSPEC 1.011 10/09/2013 0346   PHURINE 7.0 10/09/2013 0346   GLUCOSEU 250 (A) 10/09/2013 0346   HGBUR SMALL (A) 10/09/2013 0346   BILIRUBINUR NEGATIVE 10/09/2013 0346   KETONESUR NEGATIVE 10/09/2013 0346   PROTEINUR 100 (A) 10/09/2013 0346   UROBILINOGEN 0.2 10/09/2013 0346   NITRITE NEGATIVE 10/09/2013 0346   LEUKOCYTESUR TRACE (A) 10/09/2013 0346    ) Recent Results (from the past 240 hour(s))  Culture, blood (Routine X 2) w Reflex to ID Panel     Status: None (Preliminary result)   Collection Time: 05/31/16  4:14 PM  Result Value Ref Range Status   Specimen Description BLOOD RIGHT ANTECUBITAL  Final   Special Requests BOTTLES DRAWN AEROBIC AND ANAEROBIC  5CC  Final   Culture NO GROWTH < 24 HOURS  Final   Report Status PENDING  Incomplete  Culture, blood (Routine X 2) w Reflex to ID Panel     Status: None (Preliminary result)   Collection Time: 05/31/16  4:27 PM  Result Value Ref Range Status   Specimen Description BLOOD LEFT HAND  Final   Special Requests IN PEDIATRIC BOTTLE 3CC  Final   Culture NO GROWTH < 24 HOURS  Final   Report Status PENDING  Incomplete      Anti-infectives    Start     Dose/Rate Route Frequency Ordered Stop   05/31/16 1415  cefTRIAXone (ROCEPHIN) 1 g in dextrose 5 % 50 mL IVPB  Status:  Discontinued     1 g 100 mL/hr over 30 Minutes Intravenous  Once 05/31/16 1405 06/02/16 1034   05/31/16 1415  azithromycin (ZITHROMAX) 500 mg in dextrose 5 % 250 mL IVPB     500 mg 250 mL/hr over 60 Minutes Intravenous  Once 05/31/16 1405 05/31/16 1834       Radiology Studies: Dg Chest 2 View  Result Date: 06/01/2016 CLINICAL DATA:  Acute on chronic combined systolic and diastolic heart failure. Atrial fibrillation. Coronary artery disease. EXAM: CHEST  2 VIEW COMPARISON:  05/31/2016 FINDINGS: Cardiomegaly stable improved aeration of both lungs  is seen. Both lungs are clear. No evidence of pneumothorax or pleural effusion. IMPRESSION: Mild cardiomegaly.  No active lung disease. Electronically Signed   By: Earle Gell M.D.   On: 06/01/2016 08:02        Scheduled Meds: . amLODipine  10 mg Oral QHS  . calcitRIOL  0.25 mcg Oral Daily  . insulin aspart  0-5 Units Subcutaneous QHS  . insulin aspart  0-9 Units Subcutaneous TID WC  . insulin glargine  10 Units Subcutaneous QHS  . sodium chloride flush  3 mL Intravenous Q12H   Continuous Infusions: . heparin 700 Units/hr (06/01/16 2135)     LOS: 1 day    Time spent: 35 min    [place], DO [facility] Pager [phone]  If 7PM-7AM, please contact night-coverage [email or web address] Password [email or web address] 06/02/2016, 12:55 PM

## 2016-06-02 NOTE — Evaluation (Signed)
Physical Therapy Evaluation Patient Details Name: [name] MRN: [id number] DOB: 08-05-1928 Today's Date: 06/02/2016   History of Present Illness  82 y.o. female admitted to [facility] on 05/31/16 for SOB and chest pain.  She was found to have symptomatic bradycardia, AKI on CKD stage IV, elevated troponin/NSTEMI.  Pt with significant PMHx of PE, PAF, LBBB, HTN, GIB, DM, caridomyopathy, CAD, R shoulder surgery, and L knee surgery.   Clinical Impression  Pt able to walk a good distance around the unit this evening with VSS.  HR ranged from 54-90 bpm during gait and O2 sats were stable on RA during gait.  Pt with mildly staggering gait pattern, but has not really been up walking around outside of her room since admission.  She is normally very independent and I anticipate that she can return to her home without significant PT f/u at discharge, but we will follow acutely for deficits listed below.     Follow Up Recommendations No PT follow up    Equipment Recommendations  None recommended by PT    Recommendations for Other Services   NA    Precautions / Restrictions Precautions Precautions: Other (comment) Precaution Comments: monitor HR      Mobility  Bed Mobility Overal bed mobility: Modified Independent                Transfers Overall transfer level: Needs assistance Equipment used: None Transfers: Sit to/from Stand Sit to Stand: Min guard         General transfer comment: min guard assist for safety, pt mildly unsteady on her feet  Ambulation/Gait Ambulation/Gait assistance: Min guard Ambulation Distance (Feet): 200 Feet Assistive device: None (pt lightly reaching for hallway rail at times) Gait Pattern/deviations: Step-through pattern;Staggering left;Staggering right Gait velocity: decreased   General Gait Details: pt with mildly staggering gait pattern.  Min guard assist for safety. HR ranged from 54-90 (at rest-max observed during gait)          Balance Overall balance assessment: Needs assistance Sitting-balance support: Feet supported;Bilateral upper extremity supported Sitting balance-Leahy Scale: Good     Standing balance support: Bilateral upper extremity supported;No upper extremity supported;Single extremity supported Standing balance-Leahy Scale: Good                               Pertinent Vitals/Pain Pain Assessment: No/denies pain    Home Living Family/patient expects to be discharged to:: Private residence Living Arrangements: Alone Available Help at Discharge: Family;Available PRN/intermittently Type of Home: House Home Access: Stairs to enter Entrance Stairs-Rails: None Entrance Stairs-Number of Steps: 1 Home Layout: One level Home Equipment: None      Prior Function Level of Independence: Independent         Comments: pt drives, mows her grass, enjoys dancing.         Extremity/Trunk Assessment   Upper Extremity Assessment: Overall WFL for tasks assessed           Lower Extremity Assessment: Generalized weakness      Cervical / Trunk Assessment: Normal  Communication   Communication: No difficulties  Cognition Arousal/Alertness: Awake/alert Behavior During Therapy: WFL for tasks assessed/performed Overall Cognitive Status: Within Functional Limits for tasks assessed                             Assessment/Plan    PT Assessment Patient needs continued PT services  PT Problem  List Decreased strength;Decreased activity tolerance;Decreased balance;Decreased mobility;Cardiopulmonary status limiting activity          PT Treatment Interventions DME instruction;Gait training;Stair training;Functional mobility training;Therapeutic activities;Therapeutic exercise;Balance training;Patient/family education    PT Goals (Current goals can be found in the Care Plan section)  Acute Rehab PT Goals Patient Stated Goal: to go home PT Goal Formulation: With  patient Time For Goal Achievement: 06/16/16 Potential to Achieve Goals: Good    Frequency Min 3X/week           End of Session   Activity Tolerance: Patient tolerated treatment well Patient left: in bed;with call bell/phone within reach;with family/visitor present (seated EOB to eat) Nurse Communication: Mobility status         Time: [id number] PT Time Calculation (min) (ACUTE ONLY): 20 min   Charges:   PT Evaluation $PT Eval Moderate Complexity: 1 Procedure          Tujuana Kilmartin B. Saline, [place], DPT [phone]   06/02/2016, 6:15 PM

## 2016-06-02 NOTE — Progress Notes (Addendum)
ANTICOAGULATION CONSULT NOTE - Follow Up Consult  Pharmacy Consult for Heparin Indication: chest pain/ACS   Assessment: 88yof started on heparin 10/16 for chest pain with positive troponins.  Heparin level continues to be at goal this morning (0.34) on 700 units/hr. CBC overall stable and no bleeding issues have been noted. History of PE/DVT on coumadin at some time, but not currently. Also history of GIB so high risk for anticoagulation.   Cardiology planning to continue medical management for now.  Goal of Therapy:  Heparin level 0.3-0.7 units/ml Monitor platelets by anticoagulation protocol: Yes   Plan:  1) Continue heparin at 700 units/hr 2) Daily heparin level and CBC   Allergies  Allergen Reactions  . Statins Other (See Comments)    Muscle Aches  . Lisinopril Other (See Comments) and Cough    REACTION: INTOL to ACE's w/ cough  . Penicillins Itching and Rash    Patient Measurements: Height: 5\' 2"  (157.5 cm) Weight: 125 lb 4.8 oz (56.8 kg) IBW/kg (Calculated) : 50.1  Vital Signs: Temp: 98.6 F (37 C) (10/17 0750) Temp Source: Oral (10/17 0750) BP: 150/72 (10/17 0750) Pulse Rate: 57 (10/17 0750)  Labs:  Recent Labs  05/31/16 1215  05/31/16 2117 06/01/16 0325 06/01/16 2058 06/02/16 0552  HGB 12.2  --   --   --   --  11.6*  HCT 37.3  --   --   --   --  35.9*  PLT 138*  --   --   --   --  126*  LABPROT 12.3  --   --   --   --   --   INR 0.92  --   --   --   --   --   HEPARINUNFRC  --   --   --   --  0.30 0.34  CREATININE 2.15*  --   --  2.37*  --  2.85*  TROPONINI 0.21*  < > 4.19* 6.30*  --  6.43*  < > = values in this interval not displayed.  Estimated Creatinine Clearance: 11.2 mL/min (by C-G formula based on SCr of 2.85 mg/dL (H)).   Medications:  Heparin @ 700 units/hr  Erin Hearing PharmD., BCPS Clinical Pharmacist Pager [phone] 06/02/2016 10:14 AM

## 2016-06-02 NOTE — Progress Notes (Signed)
Patient Name: [name] Date of Encounter: 06/02/2016  Primary Cardiologist: Dr. [facility] Problem List     Principal Problem:   Acute on chronic combined systolic and diastolic heart failure ([place]) Active Problems:   HYPERCHOLESTEROLEMIA   Hypertensive cardiomyopathy (HCC)   CKD (chronic kidney disease), stage IV (HCC)   Diabetes mellitus type 2 in nonobese (HCC)   Symptomatic bradycardia   Elevated troponin   Thrombocytopenia [facility])    Patient Profile     OLINE [name] is a 82 y.o. female with a history of NICM since 2010, hypertension, non obstructive CAD, tachyarrhythmias, PE and DVT on coumadin previously, TIA, who presented for evaluation of chest pain.  Prior to arrival, EMS was called and found to be hypertensive at 210/120. Given aspirin 324 mg and sublingual nitroglycerin x 2 with improvement. In ED, Chest x-ray showed infiltration on right side with likely pneumonia versus aspiration. BNP elevated at 462. Troponin 0.21. Platelets 138. She was given IV Lasix 40 mg x 1. Serum creatinine of 2.15. EKG showed sinus rhythm at rate of 76 bpm and chronic left bundle branch block. . Placed on IV heparin yesterday.  Subjective   Chest pain free this am. No dyspnea. Resting comfortably.   Inpatient Medications    Scheduled Meds: . amLODipine  10 mg Oral QHS  . calcitRIOL  0.25 mcg Oral Daily  . cefTRIAXone (ROCEPHIN)  IV  1 g Intravenous Once  . insulin aspart  0-5 Units Subcutaneous QHS  . insulin aspart  0-9 Units Subcutaneous TID WC  . insulin glargine  10 Units Subcutaneous QHS  . losartan  100 mg Oral Daily  . sodium chloride flush  3 mL Intravenous Q12H   Continuous Infusions: . heparin 700 Units/hr (06/01/16 2135)   PRN Meds: sodium chloride, acetaminophen, ondansetron (ZOFRAN) IV, sodium chloride flush, vitamin C   Vital Signs    Vitals:   06/01/16 1653 06/01/16 2107 06/02/16 0526 06/02/16 0750  BP: 114/77 (!) 131/51 (!) 145/96 (!)  150/72  Pulse: (!) 50 (!) 48 85 (!) 57  Resp: 15 15 (!) 26 15  Temp: 98 F (36.7 C) 98.5 F (36.9 C) 98.4 F (36.9 C) 98.6 F (37 C)  TempSrc: Oral Oral Oral Oral  SpO2: 100% 100% 100% 96%  Weight:   125 lb 4.8 oz (56.8 kg)   Height:        Intake/Output Summary (Last 24 hours) at 06/02/16 0928 Last data filed at 06/02/16 [id number]  Gross per 24 hour  Intake          1230.25 ml  Output             1750 ml  Net          -519.75 ml   Filed Weights   05/31/16 1207 06/01/16 0400 06/02/16 0526  Weight: 120 lb (54.4 kg) 123 lb 11.2 oz (56.1 kg) 125 lb 4.8 oz (56.8 kg)    Physical Exam    GEN: Well nourished, well developed, in no acute distress, elderly HEENT: Grossly normal.  Neck: Supple, no JVD, carotid bruits, or masses. Cardiac: RRR, no murmurs, rubs, or gallops. No clubbing, cyanosis, edema.  Radials/DP/PT 2+ and equal bilaterally.  Respiratory:  Respirations regular and unlabored, clear to auscultation bilaterally. GI: Soft, nontender, nondistended, BS + x 4. MS: no deformity or atrophy. Skin: warm and dry, no rash. Neuro:  Strength and sensation are intact. Psych: AAOx3.  Normal affect.  Labs  CBC  Recent Labs  05/31/16 1215 06/02/16 0552  WBC 7.7 5.3  NEUTROABS 6.5  --   HGB 12.2 11.6*  HCT 37.3 35.9*  MCV 85.9 85.5  PLT 138* 123XX123*   Basic Metabolic Panel  Recent Labs  06/01/16 0325 06/02/16 0552  NA 141 140  K 3.7 3.8  CL 106 105  CO2 26 26  GLUCOSE 150* 121*  BUN 37* 45*  CREATININE 2.37* 2.85*  CALCIUM 9.1 9.1   Liver Function Tests  Recent Labs  05/31/16 1215  AST 26  ALT 13*  ALKPHOS 56  BILITOT 0.6  PROT 6.6  ALBUMIN 3.3*   No results for input(s): LIPASE, AMYLASE in the last 72 hours. Cardiac Enzymes  Recent Labs  05/31/16 2117 06/01/16 0325 06/02/16 0552  TROPONINI 4.19* 6.30* 6.43*   BNP Invalid input(s): POCBNP D-Dimer No results for input(s): DDIMER in the last 72 hours. Hemoglobin A1C  Recent Labs   05/31/16 1614  HGBA1C 7.5*   Fasting Lipid Panel No results for input(s): CHOL, HDL, LDLCALC, TRIG, CHOLHDL, LDLDIRECT in the last 72 hours. Thyroid Function Tests  Recent Labs  06/01/16 0326  TSH 0.958    Telemetry    Sinus bradycardia, rate in the mid 50s,  Brief NSVT - Personally Reviewed  ECG    Sinus bradycardia. 46 bpm, LBBB- Personally Reviewed  Radiology    Dg Chest 2 View  Result Date: 06/01/2016 CLINICAL DATA:  Acute on chronic combined systolic and diastolic heart failure. Atrial fibrillation. Coronary artery disease. EXAM: CHEST  2 VIEW COMPARISON:  05/31/2016 FINDINGS: Cardiomegaly stable improved aeration of both lungs is seen. Both lungs are clear. No evidence of pneumothorax or pleural effusion. IMPRESSION: Mild cardiomegaly.  No active lung disease. Electronically Signed   By: Earle Gell M.D.   On: 06/01/2016 08:02   TTE: 10/16  Study Conclusions  - Left ventricle: The cavity size was moderately dilated. Wall   thickness was normal. Systolic function was moderately to   severely reduced. The estimated ejection fraction was in the   range of 30% to 35%. Diffuse hypokinesis. Left ventricular   diastolic function parameters were normal. - Mitral valve: There was mild regurgitation. - Left atrium: The atrium was mildly dilated. - Atrial septum: Atrial septum thinned and bowed to right cannot   r/o PFO.  Cardiac Studies   Cardiac Panel (last 3 results)  Recent Labs  05/31/16 2117 06/01/16 0325 06/02/16 0552  TROPONINI 4.19* 6.30* 6.43*    Patient Profile     [name] is a 82 y.o. female with a history of NICM since 2010, hypertension, non obstructive CAD, tachyarrhythmias, PE and DVT on coumadin previously, TIA, who presented for evaluation of chest pain.  Prior to arrival, EMS was called and found to be hypertensive at 210/120. Given aspirin 324 mg and sublingual nitroglycerin x 2 with improvement. In ED, Chest x-ray showed  infiltration on right side with likely pneumonia versus aspiration. BNP elevated at 462. Troponin 6.43. Platelets 138. She was given IV Lasix 40 mg x 1. Serum creatinine of 2.85. EKG showed sinus rhythm at rate of 76 bpm and chronic left bundle branch block.    Assessment & Plan    1. Chest pain -Based on history seems like patient has a 2 component --> pounding sensation followed by achy chest pain. This been ongoing for many years. Recently  suddenly worsened. Could be due to uncontrolled hypertension versus possible pneumonia. Significant Troponin rise 0.21>>1.42>>4.19>>6.30. She is CP  free currently. No dyspnea.  -- Echo yesterday showed EF further decreased at 30-35% with diffuse hypokinesis.  -- Started on IV heparin yesterday, Hgb stable. Cr worse today at 2.85. Continue with medical therapy at this time.   2. H/o Nonobstructive CAD Cath 11/2008 showed scattered disease of the LAD, but did not appear to be critical and was 50% after diagonal takeoff.  Last Myoview November 2016 showed no inducible ischemia. It was read as high risk study due to severely reduced systolic function. Now with recent CP and elevated troponins with significant rise in levels. Possibility of progressive CAD, causing coronary ischemia. No BB given bradycardia.   3. Nonischemic cardiomyopathy - Last echo 03/2015 showed LVEF of 35-40%, moderate diffuse hypokinesis, grade 2 DD, severely dilated LA, medium-sized atrial septal aneurysm. -- Repeat this admission showed further decrease to 30-35% Suspected hypertensive cardiomyopathy.  4. Uncontrolled HTN  - BP of 210/120 per EMS. Currently improved. The patient states that as she is taking her medicine. Concern regarding patient's ability to take medicine. She lives independently by herself.  -- Improved but remained slightly elevated. Continue amlodipine, would hold losartan given rise in Cr.  5. Sinus arrhythmia - Felt ectopic rhythm on monitor 06/2015,  No A. fib  per Dr. Percival Spanish. ? Prior hx of PAF. She is high risk for anticoagulation due to prior history of GI bleeding. Monitor on telemetry. Early juctional rhythm on tele. Reviewed monitor reading with Dr. Rayann Heman--? Non sustained afib. Did have brief NSVT today. Hold BB given bradycardia.    6. CKD, stage III - Followed by [place] [facility]. Unknown baseline. SCr 2.85 today.   7. Sinus Bradycardia: HR in the mid 50s. She is asymptomatic. No AVN blocking agents. Monitor on telemetry.   Signed, Reino Bellis, NP -C 06/02/2016, 9:28 AM   I have examined the patient and reviewed assessment and plan and discussed with patient.  Agree with above as stated.  Increased cr. Continue medical therapy.  IV heparin for another 24 hrs.  No cath today.  Will reassess in AM.  If significant renal function persists and she does not have CP, would plan on long term medical therapy only rather than cath.   Larae Grooms

## 2016-06-03 DIAGNOSIS — I43 Cardiomyopathy in diseases classified elsewhere: Secondary | ICD-10-CM

## 2016-06-03 DIAGNOSIS — E119 Type 2 diabetes mellitus without complications: Secondary | ICD-10-CM

## 2016-06-03 DIAGNOSIS — E7800 Pure hypercholesterolemia, unspecified: Secondary | ICD-10-CM

## 2016-06-03 DIAGNOSIS — I110 Hypertensive heart disease with heart failure: Secondary | ICD-10-CM

## 2016-06-03 LAB — BASIC METABOLIC PANEL
Anion gap: 9 (ref 5–15)
BUN: 46 mg/dL — AB (ref 6–20)
CO2: 25 mmol/L (ref 22–32)
CREATININE: 2.86 mg/dL — AB (ref 0.44–1.00)
Calcium: 9.8 mg/dL (ref 8.9–10.3)
Chloride: 107 mmol/L (ref 101–111)
GFR calc Af Amer: 16 mL/min — ABNORMAL LOW (ref >60)
GFR, EST NON AFRICAN AMERICAN: 14 mL/min — AB (ref >60)
GLUCOSE: 149 mg/dL — AB (ref 65–99)
Potassium: 4.2 mmol/L (ref 3.5–5.1)
SODIUM: 141 mmol/L (ref 135–145)

## 2016-06-03 LAB — CBC
HCT: 38.1 % (ref 36.0–46.0)
HEMOGLOBIN: 12.4 g/dL (ref 12.0–15.0)
MCH: 27.9 pg (ref 26.0–34.0)
MCHC: 32.5 g/dL (ref 30.0–36.0)
MCV: 85.6 fL (ref 78.0–100.0)
Platelets: 137 10*3/uL — ABNORMAL LOW (ref 150–400)
RBC: 4.45 MIL/uL (ref 3.87–5.11)
RDW: 13.3 % (ref 11.5–15.5)
WBC: 6 10*3/uL (ref 4.0–10.5)

## 2016-06-03 LAB — GLUCOSE, CAPILLARY
GLUCOSE-CAPILLARY: 117 mg/dL — AB (ref 65–99)
GLUCOSE-CAPILLARY: 100 mg/dL — AB (ref 65–99)
GLUCOSE-CAPILLARY: 121 mg/dL — AB (ref 65–99)
Glucose-Capillary: 176 mg/dL — ABNORMAL HIGH (ref 65–99)

## 2016-06-03 LAB — TROPONIN I: TROPONIN I: 4.97 ng/mL — AB (ref <0.03)

## 2016-06-03 LAB — HEPARIN LEVEL (UNFRACTIONATED): HEPARIN UNFRACTIONATED: 0.47 [IU]/mL (ref 0.30–0.70)

## 2016-06-03 MED ORDER — HYDRALAZINE HCL 10 MG PO TABS
10.0000 mg | ORAL_TABLET | Freq: Three times a day (TID) | ORAL | Status: DC
  Administered 2016-06-03 – 2016-06-04 (×5): 10 mg via ORAL
  Filled 2016-06-03 (×6): qty 1

## 2016-06-03 NOTE — Progress Notes (Signed)
ANTICOAGULATION CONSULT NOTE - Follow Up Consult  Pharmacy Consult for Heparin Indication: chest pain/ACS   Assessment: 88yof started on heparin 10/16 for chest pain with positive troponins.  Heparin level continues to be at goal this morning (0.47) on 700 units/hr. CBC overall stable and no bleeding issues have been noted. Heparin stopped by cardiology today.  History of PE/DVT on coumadin at some time, but not currently. Also history of GIB so high risk for anticoagulation.    Goal of Therapy:  Heparin level 0.3-0.7 units/ml Monitor platelets by anticoagulation protocol: Yes   Plan:  1) heparin off  Allergies  Allergen Reactions  . Statins Other (See Comments)    Muscle Aches  . Lisinopril Other (See Comments) and Cough    REACTION: INTOL to ACE's w/ cough  . Penicillins Itching and Rash    Patient Measurements: Height: 5\' 2"  (157.5 cm) Weight: 127 lb 3.2 oz (57.7 kg) IBW/kg (Calculated) : 50.1  Vital Signs: Temp: 97.5 F (36.4 C) (10/18 0500) Temp Source: Oral (10/18 0500) BP: 153/67 (10/18 0500) Pulse Rate: 48 (10/18 0500)  Labs:  Recent Labs  06/01/16 0325 06/01/16 2058 06/02/16 0552 06/03/16 0448  HGB  --   --  11.6* 12.4  HCT  --   --  35.9* 38.1  PLT  --   --  126* 137*  HEPARINUNFRC  --  0.30 0.34 0.47  CREATININE 2.37*  --  2.85* 2.86*  TROPONINI 6.30*  --  6.43* 4.97*    Estimated Creatinine Clearance: 11.2 mL/min (by C-G formula based on SCr of 2.86 mg/dL (H)).   Medications:  Heparin @ 700 units/hr  Erin Hearing PharmD., BCPS Clinical Pharmacist Pager [phone] 06/03/2016 2:32 PM

## 2016-06-03 NOTE — Progress Notes (Signed)
PROGRESS NOTE    [name]  [id number] DOB: 1928-01-18 DOA: 05/31/2016 PCP: Binnie Rail, MD   Outpatient Specialists:     Brief Narrative:  [name] is a 82 y.o. female with medical history significant for nonischemic hypertensive cardiomyopathy with chronic combined systolic and diastolic heart failure, chronic kidney disease stage IV, diabetes on oral agents, dyslipidemia, chronic thrombocytopenia. Patient was at church today when she developed nausea and vomiting and chest pain. Upon EMS arrival she was found to be diaphoretic and short of breath. She was initially placed on BiPAP. She was given sublingual nitroglycerin with resolution of her chest pain. Upon arrival to the ER she had a mildly elevated troponin of 0.21. EKG with chronic left bundle-branch block, BNP elevated at 462. She was not hypoxemic she did not have any leukocytosis and did not have any fever but has a non-dense infiltrate in the right base concerning for either heart failure or infectious etiology. Since arrival to the ER patient has been noted to have sinus bradycardia with rates decreasing as low as 37 bpm with frequent junctional escape beats while increasing heart rate to a maximum of 47-51 bpm. Cardiology has been consulted by the EDP and have evaluated the patient. Patient has chronic dyspnea on exertion with ambulating for long periods of time but is able to mobilize around her house and perform ADLs without any shortness of breath. Patient denies excessive salt intake, no medication changes, reports her weight varies from 119-120 pounds. She reports poor appetite basically eating one healthy meal per day.   Assessment & Plan:   Principal Problem:   Acute on chronic combined systolic and diastolic heart failure (HCC) Active Problems:   HYPERCHOLESTEROLEMIA   Hypertensive cardiomyopathy (HCC)   CKD (chronic kidney disease), stage IV (HCC)   Diabetes mellitus type 2 in nonobese (HCC)  Symptomatic bradycardia   Elevated troponin   Thrombocytopenia (HCC)     Symptomatic bradycardia -Cardiology signed off-- will need to follow up as outpatient -Hold beta blocker -TSH ok      AKI on CKD (chronic kidney disease), stage IV  -Watch renal function--- would like to see trending down before discharge-- follows with Dr. Joelyn Oms outpatient (due for labs this week) -previous CR was 2.11 -d/c IV lasix (got 3 doses of 40 mg IV lasix)    Diabetes mellitus type 2 in nonobese  -Monitor for hypoglycemia -Hemoglobin A1c -Sliding-scale insulin -low does lantus while in hospital    Elevated troponin/NSTEMI -Patient did have chest pain that was responsive to nitroglycerin -Appreciate cardiology consultation- cath on hold secondary to increased Cr -Patient underwent gated nuclear stress in 2016 with estimated EF 24% with a normal perfusion study and no ST segment deviation during the stress portion but study was deemed high risk due to severely reduced systolic function    HYPERCHOLESTEROLEMIA -Not on statin    Thrombocytopenia  -Chronic in nature and platelets have typically been greater than 100,000      DVT prophylaxis:  Fully anticoagulated   Code Status: Full Code   Family Communication: patient  Disposition Plan:  Home in AM   Consultants:   cardiology     Subjective: Says she did too much activity on Saturday-- dug up rose bushes  Objective: Vitals:   06/02/16 1804 06/02/16 2000 06/02/16 2134 06/03/16 0500  BP:  116/61 (!) 158/99 (!) 153/67  Pulse: 85 (!) 55 62 (!) 48  Resp:  14 13 11   Temp:   97.3 F (  36.3 C) 97.5 F (36.4 C)  TempSrc:   Oral Oral  SpO2: 98% 96% 100% 96%  Weight:    57.7 kg (127 lb 3.2 oz)  Height:        Intake/Output Summary (Last 24 hours) at 06/03/16 1306 Last data filed at 06/03/16 1017  Gross per 24 hour  Intake              240 ml  Output             1000 ml  Net             -760 ml   Filed Weights    06/01/16 0400 06/02/16 0526 06/03/16 0500  Weight: 56.1 kg (123 lb 11.2 oz) 56.8 kg (125 lb 4.8 oz) 57.7 kg (127 lb 3.2 oz)    Examination:  General exam: Appears calm and comfortable  Respiratory system: Clear to auscultation. Respiratory effort normal. Cardiovascular system: slow Gastrointestinal system: Abdomen is nondistended, soft and nontender. No organomegaly or masses felt. Normal bowel sounds heard. Central nervous system: Alert and oriented. No focal neurological deficits.     Data Reviewed: I have personally reviewed following labs and imaging studies  CBC:  Recent Labs Lab 05/31/16 1215 06/02/16 0552 06/03/16 0448  WBC 7.7 5.3 6.0  NEUTROABS 6.5  --   --   HGB 12.2 11.6* 12.4  HCT 37.3 35.9* 38.1  MCV 85.9 85.5 85.6  PLT 138* 126* 0000000*   Basic Metabolic Panel:  Recent Labs Lab 05/31/16 1215 06/01/16 0325 06/02/16 0552 06/03/16 0448  NA 140 141 140 141  K 4.4 3.7 3.8 4.2  CL 109 106 105 107  CO2 22 26 26 25   GLUCOSE 197* 150* 121* 149*  BUN 29* 37* 45* 46*  CREATININE 2.15* 2.37* 2.85* 2.86*  CALCIUM 9.1 9.1 9.1 9.8   GFR: Estimated Creatinine Clearance: 11.2 mL/min (by C-G formula based on SCr of 2.86 mg/dL (H)). Liver Function Tests:  Recent Labs Lab 05/31/16 1215  AST 26  ALT 13*  ALKPHOS 56  BILITOT 0.6  PROT 6.6  ALBUMIN 3.3*   No results for input(s): LIPASE, AMYLASE in the last 168 hours. No results for input(s): AMMONIA in the last 168 hours. Coagulation Profile:  Recent Labs Lab 05/31/16 1215  INR 0.92   Cardiac Enzymes:  Recent Labs Lab 05/31/16 1614 05/31/16 2117 06/01/16 0325 06/02/16 0552 06/03/16 0448  TROPONINI 1.42* 4.19* 6.30* 6.43* 4.97*   BNP (last 3 results) No results for input(s): PROBNP in the last 8760 hours. HbA1C:  Recent Labs  05/31/16 1614  HGBA1C 7.5*   CBG:  Recent Labs Lab 06/02/16 1131 06/02/16 1638 06/02/16 2132 06/03/16 0733 06/03/16 1131  GLUCAP 148* 163* 141* 117* 176*    Lipid Profile: No results for input(s): CHOL, HDL, LDLCALC, TRIG, CHOLHDL, LDLDIRECT in the last 72 hours. Thyroid Function Tests:  Recent Labs  06/01/16 0326  TSH 0.958   Anemia Panel: No results for input(s): VITAMINB12, FOLATE, FERRITIN, TIBC, IRON, RETICCTPCT in the last 72 hours. Urine analysis:    Component Value Date/Time   COLORURINE YELLOW 10/09/2013 0346   APPEARANCEUR CLEAR 10/09/2013 0346   LABSPEC 1.011 10/09/2013 0346   PHURINE 7.0 10/09/2013 0346   GLUCOSEU 250 (A) 10/09/2013 0346   HGBUR SMALL (A) 10/09/2013 0346   BILIRUBINUR NEGATIVE 10/09/2013 0346   KETONESUR NEGATIVE 10/09/2013 0346   PROTEINUR 100 (A) 10/09/2013 0346   UROBILINOGEN 0.2 10/09/2013 0346   NITRITE NEGATIVE 10/09/2013 0346   LEUKOCYTESUR  TRACE (A) 10/09/2013 0346    ) Recent Results (from the past 240 hour(s))  Culture, blood (Routine X 2) w Reflex to ID Panel     Status: None (Preliminary result)   Collection Time: 05/31/16  4:14 PM  Result Value Ref Range Status   Specimen Description BLOOD RIGHT ANTECUBITAL  Final   Special Requests BOTTLES DRAWN AEROBIC AND ANAEROBIC 5CC  Final   Culture NO GROWTH 2 DAYS  Final   Report Status PENDING  Incomplete  Culture, blood (Routine X 2) w Reflex to ID Panel     Status: None (Preliminary result)   Collection Time: 05/31/16  4:27 PM  Result Value Ref Range Status   Specimen Description BLOOD LEFT HAND  Final   Special Requests IN PEDIATRIC BOTTLE 3CC  Final   Culture NO GROWTH 2 DAYS  Final   Report Status PENDING  Incomplete      Anti-infectives    Start     Dose/Rate Route Frequency Ordered Stop   05/31/16 1415  cefTRIAXone (ROCEPHIN) 1 g in dextrose 5 % 50 mL IVPB  Status:  Discontinued     1 g 100 mL/hr over 30 Minutes Intravenous  Once 05/31/16 1405 06/02/16 1034   05/31/16 1415  azithromycin (ZITHROMAX) 500 mg in dextrose 5 % 250 mL IVPB     500 mg 250 mL/hr over 60 Minutes Intravenous  Once 05/31/16 1405 05/31/16 1834        Radiology Studies: No results found.      Scheduled Meds: . amLODipine  10 mg Oral QHS  . calcitRIOL  0.25 mcg Oral Daily  . hydrALAZINE  10 mg Oral Q8H  . insulin aspart  0-5 Units Subcutaneous QHS  . insulin aspart  0-9 Units Subcutaneous TID WC  . insulin glargine  10 Units Subcutaneous QHS  . sodium chloride flush  3 mL Intravenous Q12H   Continuous Infusions:     LOS: 2 days    Time spent: 25 min    [place], DO [facility] Pager [phone]  If 7PM-7AM, please contact night-coverage [email or web address] Password [email or web address] 06/03/2016, 1:06 PM

## 2016-06-03 NOTE — Consult Note (Signed)
           [facility] CM Primary Care Navigator  06/03/2016  [name] [birth year] [id number]   Patient seen at the bedside to identify discharge needs. Patient shared that she felt "heart racing/ thudding", nausea, deep ache to chest, cold/ clammy sweating which led to this admission. Discharge plan is to go back home per patient.  Patient confirms that her primary care provider is Dr. Billey Gosling from [facility]. Transportation to doctors' appointments is done by herself prior to admission but has a son-in law Milbert Coulter R.) who can transport her if needed per patient.  Patient states using [facility] in [place] to obtain medications without difficulty. Patient does her own medication management at home straight from  bottle containers a stated.  Patient will be the primary caregiver for herself at home but has a church member (Myrna C.) who can assist with her needs whenever needed per patient.  Patient had expressed understanding to call primary care provider's office for a post discharge follow-up appointment within a week or sooner if needed. Patient letter provided as a reminder.  Patient states she is being followed by Dr. Loanne Drilling for her DM. Recent A1c is 7.5 which had improved from range of 8 to 9 as stated.  She mentioned of previously attending DM class and is taking oral agents for it. She is currently on HF program through [facility] using tele monitoring scale to check daily weights. She is aware of HF medications. Education on HF zones/ zone tools discussed with patient.  Patient denies any additional needs or concerns at this time.  For questions, please contact:  Dannielle Huh, BSN, RN- [facility] Primary Care Navigator  Telephone: [phone] [place]

## 2016-06-03 NOTE — Plan of Care (Signed)
Problem: Education: Goal: Understanding of cardiac disease, CV risk reduction, and recovery process will improve Outcome: Completed/Met Date Met: 06/03/16 Ambulated in hallway without chest pain or palpitations.  Ambulating with Cardiac Rehab.  Patient also watched Heart Failure video.

## 2016-06-03 NOTE — Progress Notes (Signed)
Pt had 14 beats of vtach. Pt asymptomatic and resting comfortably. On call MD paged. No new orders received. Will continue to monitor.  [name] E

## 2016-06-03 NOTE — Progress Notes (Addendum)
Patient Name: [name] Date of Encounter: 06/03/2016  Primary Cardiologist: Dr. [facility] Problem List     Principal Problem:   Acute on chronic combined systolic and diastolic heart failure ([place]) Active Problems:   HYPERCHOLESTEROLEMIA   Hypertensive cardiomyopathy (HCC)   CKD (chronic kidney disease), stage IV (HCC)   Diabetes mellitus type 2 in nonobese (HCC)   Symptomatic bradycardia   Elevated troponin   Thrombocytopenia [facility])    Patient Profile     [name] is a 82 y.o. female with a history of NICM since 2010, hypertension, non obstructive CAD, tachyarrhythmias, PE and DVT on coumadin previously, TIA, who presented for evaluation of chest pain. Prior to arrival, EMS was called and found to be hypertensive at 210/120. In ED, Chest x-ray showed infiltration on right side with likely pneumonia versus aspiration. BNP elevated at 462. Troponin 0.21. Platelets 138. Serum creatinine of 2.15. EKG showed sinus rhythm at rate of 76 bpm and chronic left bundle branch block. Placed on IV heparin with plan for possible cath if Cr improves.   Subjective   Sitting up in bed reading the paper. Continues to deny anginal symptoms  Inpatient Medications    Scheduled Meds: . amLODipine  10 mg Oral QHS  . calcitRIOL  0.25 mcg Oral Daily  . insulin aspart  0-5 Units Subcutaneous QHS  . insulin aspart  0-9 Units Subcutaneous TID WC  . insulin glargine  10 Units Subcutaneous QHS  . sodium chloride flush  3 mL Intravenous Q12H   Continuous Infusions: . heparin 700 Units/hr (06/02/16 2033)   PRN Meds: sodium chloride, acetaminophen, ondansetron (ZOFRAN) IV, sodium chloride flush, vitamin C   Vital Signs    Vitals:   06/02/16 1804 06/02/16 2000 06/02/16 2134 06/03/16 0500  BP:  116/61 (!) 158/99 (!) 153/67  Pulse: 85 (!) 55 62 (!) 48  Resp:  14 13 11   Temp:   97.3 F (36.3 C) 97.5 F (36.4 C)  TempSrc:   Oral Oral  SpO2: 98% 96% 100% 96%  Weight:     127 lb 3.2 oz (57.7 kg)  Height:        Intake/Output Summary (Last 24 hours) at 06/03/16 0858 Last data filed at 06/03/16 0013  Gross per 24 hour  Intake              600 ml  Output              900 ml  Net             -300 ml   Filed Weights   06/01/16 0400 06/02/16 0526 06/03/16 0500  Weight: 123 lb 11.2 oz (56.1 kg) 125 lb 4.8 oz (56.8 kg) 127 lb 3.2 oz (57.7 kg)    Physical Exam    GEN: Well nourished, well developed, in no acute distress, elderly HEENT: Grossly normal.  Neck: Supple, no JVD, carotid bruits, or masses. Cardiac: RRR, no murmurs, rubs, or gallops. No clubbing, cyanosis, edema.  Radials/DP/PT 2+ and equal bilaterally.  Respiratory:  Respirations regular and unlabored, clear to auscultation bilaterally. GI: Soft, nontender, nondistended, BS + x 4. MS: no deformity or atrophy. Skin: warm and dry, no rash. Neuro:  Strength and sensation are intact. Psych: AAOx3.  Normal affect.  Labs    CBC  Recent Labs  05/31/16 1215 06/02/16 0552 06/03/16 0448  WBC 7.7 5.3 6.0  NEUTROABS 6.5  --   --   HGB 12.2 11.6* 12.4  HCT 37.3 35.9* 38.1  MCV 85.9 85.5 85.6  PLT 138* 126* 0000000*   Basic Metabolic Panel  Recent Labs  06/02/16 0552 06/03/16 0448  NA 140 141  K 3.8 4.2  CL 105 107  CO2 26 25  GLUCOSE 121* 149*  BUN 45* 46*  CREATININE 2.85* 2.86*  CALCIUM 9.1 9.8   Liver Function Tests  Recent Labs  05/31/16 1215  AST 26  ALT 13*  ALKPHOS 56  BILITOT 0.6  PROT 6.6  ALBUMIN 3.3*   No results for input(s): LIPASE, AMYLASE in the last 72 hours. Cardiac Enzymes  Recent Labs  06/01/16 0325 06/02/16 0552 06/03/16 0448  TROPONINI 6.30* 6.43* 4.97*   BNP Invalid input(s): POCBNP D-Dimer No results for input(s): DDIMER in the last 72 hours. Hemoglobin A1C  Recent Labs  05/31/16 1614  HGBA1C 7.5*   Fasting Lipid Panel No results for input(s): CHOL, HDL, LDLCALC, TRIG, CHOLHDL, LDLDIRECT in the last 72 hours. Thyroid Function  Tests  Recent Labs  06/01/16 0326  TSH 0.958    Telemetry    NSR, rate 70s - Personally Reviewed  ECG    Sinus bradycardia. 46 bpm, LBBB - Personally Reviewed  Radiology    Dg Chest 2 View  Result Date: 06/01/2016 CLINICAL DATA:  Acute on chronic combined systolic and diastolic heart failure. Atrial fibrillation. Coronary artery disease. EXAM: CHEST  2 VIEW COMPARISON:  05/31/2016 FINDINGS: Cardiomegaly stable improved aeration of both lungs is seen. Both lungs are clear. No evidence of pneumothorax or pleural effusion. IMPRESSION: Mild cardiomegaly.  No active lung disease. Electronically Signed   By: Earle Gell M.D.   On: 06/01/2016 08:02   TTE: 10/16  Study Conclusions  - Left ventricle: The cavity size was moderately dilated. Wall   thickness was normal. Systolic function was moderately to   severely reduced. The estimated ejection fraction was in the   range of 30% to 35%. Diffuse hypokinesis. Left ventricular   diastolic function parameters were normal. - Mitral valve: There was mild regurgitation. - Left atrium: The atrium was mildly dilated. - Atrial septum: Atrial septum thinned and bowed to right cannot   r/o PFO.  Cardiac Studies   Cardiac Panel (last 3 results)  Recent Labs  06/01/16 0325 06/02/16 0552 06/03/16 0448  TROPONINI 6.30* 6.43* 4.97*    Patient Profile     [name] is a 82 y.o. female with a history of NICM since 2010, hypertension, non obstructive CAD, tachyarrhythmias, PE and DVT on coumadin previously, TIA, who presented for evaluation of chest pain. Prior to arrival, EMS was called and found to be hypertensive at 210/120. In ED, Chest x-ray showed infiltration on right side with likely pneumonia versus aspiration. BNP elevated at 462. Troponin 0.21. Platelets 138. Serum creatinine of 2.15. EKG showed sinus rhythm at rate of 76 bpm and chronic left bundle branch block. Placed on IV heparin with plan for possible cath if Cr  improves.   Assessment & Plan    1. Chest pain -Based on history seems like patient has a 2 component --> pounding sensation followed by achy chest pain. This been ongoing for many years. Recently  suddenly worsened. Could be due to uncontrolled hypertension versus possible pneumonia. Significant Troponin rise and peaked at 6.43. She is CP free currently. No dyspnea.  -- Echo showed EF further decreased at 30-35% with diffuse hypokinesis.  -- Started on IV heparin and continued for 24 hours. Will stop today, Hgb stable. Cr remains  the same today at 2.85. Losartan held. Continue with medical therapy at this time as kidney function has not improved. Would like to see how she does ambulating today  2. H/o Nonobstructive CAD Cath 11/2008 showed scattered disease of the LAD, but did not appear to be critical and was 50% after diagonal takeoff.  Last Myoview November 2016 showed no inducible ischemia. It was read as high risk study due to severely reduced systolic function. Now with recent CP and elevated troponins with significant rise in levels. Possibility of progressive CAD, causing coronary ischemia. No BB given bradycardia. Cancel plans for cath if she can ambulate without chest discomfort.  D/C IV heparin. Medical therapy for this MI.  Given increased Cr and lack of symptoms, risks starting to outweigh the benefits  3. Nonischemic cardiomyopathy - Last echo 03/2015 showed LVEF of 35-40%, moderate diffuse hypokinesis, grade 2 DD, severely dilated LA, medium-sized atrial septal aneurysm. -- Repeat this admission showed further decrease to 30-35% Suspected hypertensive cardiomyopathy.  4. Uncontrolled HTN  - BP of 210/120 per EMS. Currently improved. The patient states that as she is taking her medicine. Concern regarding patient's ability to take medicine. She lives independently by herself.  -- Improved but remained slightly elevated. Continue amlodipine, losartan held. Will add hydralazine 10mg   TID for better control.   5. Sinus arrhythmia - Felt ectopic rhythm on monitor 06/2015,  No A. fib per Dr. Percival Spanish. ? Prior hx of PAF. She is high risk for anticoagulation due to prior history of GI bleeding, SAH. Monitor on telemetry.  --Hold BB given bradycardia.    6. CKD, stage III - Followed by [facility]. Unknown baseline. SCr 2.85 today.  A1C 7.5.  7. Sinus Bradycardia: HR in the mid 50s. She is asymptomatic. No AVN blocking agents. Monitor on telemetry. HR higher this AM.    Signed, Reino Bellis, NP -C 06/03/2016, 8:58 AM   I have examined the patient and reviewed assessment and plan and discussed with patient.  Agree with above as stated.  Possible d/c later today or tomorrow.  WIll need cardiology and nephrology f/u with repeat labs.  [name]

## 2016-06-03 NOTE — Progress Notes (Signed)
CARDIAC REHAB PHASE I   PRE:  Rate/Rhythm: 60 SR    BP: sitting 173/72    SaO2:   MODE:  Ambulation: 550 ft   POST:  Rate/Rhythm: 103 ST with PACs    BP: sitting 199/89     SaO2:   Tolerated well, no c/o. HR and BP up afterward. No CP/SOB. Discussed MI, HF, daily wts, low sodium, taking meds, and CRPII. Pt not very interested in CRPII but I encouraged her to find balance with her activities/exercise. Will send referral to [place]. [name], ACSM 06/03/2016 12:22 PM

## 2016-06-04 DIAGNOSIS — D696 Thrombocytopenia, unspecified: Secondary | ICD-10-CM

## 2016-06-04 DIAGNOSIS — I214 Non-ST elevation (NSTEMI) myocardial infarction: Secondary | ICD-10-CM

## 2016-06-04 LAB — BASIC METABOLIC PANEL
ANION GAP: 7 (ref 5–15)
BUN: 44 mg/dL — ABNORMAL HIGH (ref 6–20)
CALCIUM: 9.4 mg/dL (ref 8.9–10.3)
CO2: 26 mmol/L (ref 22–32)
Chloride: 106 mmol/L (ref 101–111)
Creatinine, Ser: 2.4 mg/dL — ABNORMAL HIGH (ref 0.44–1.00)
GFR, EST AFRICAN AMERICAN: 20 mL/min — AB (ref >60)
GFR, EST NON AFRICAN AMERICAN: 17 mL/min — AB (ref >60)
GLUCOSE: 141 mg/dL — AB (ref 65–99)
POTASSIUM: 4 mmol/L (ref 3.5–5.1)
SODIUM: 139 mmol/L (ref 135–145)

## 2016-06-04 LAB — CBC
HEMATOCRIT: 35.6 % — AB (ref 36.0–46.0)
HEMOGLOBIN: 11.5 g/dL — AB (ref 12.0–15.0)
MCH: 27.3 pg (ref 26.0–34.0)
MCHC: 32.3 g/dL (ref 30.0–36.0)
MCV: 84.6 fL (ref 78.0–100.0)
Platelets: 142 10*3/uL — ABNORMAL LOW (ref 150–400)
RBC: 4.21 MIL/uL (ref 3.87–5.11)
RDW: 13.2 % (ref 11.5–15.5)
WBC: 5 10*3/uL (ref 4.0–10.5)

## 2016-06-04 LAB — TROPONIN I: TROPONIN I: 3.3 ng/mL — AB (ref <0.03)

## 2016-06-04 LAB — GLUCOSE, CAPILLARY
GLUCOSE-CAPILLARY: 111 mg/dL — AB (ref 65–99)
GLUCOSE-CAPILLARY: 160 mg/dL — AB (ref 65–99)

## 2016-06-04 LAB — PROCALCITONIN

## 2016-06-04 MED ORDER — ISOSORBIDE MONONITRATE ER 30 MG PO TB24: 15.0000 mg | ORAL_TABLET | Freq: Every day | ORAL | 2 refills | Status: DC

## 2016-06-04 MED ORDER — HYDRALAZINE HCL 10 MG PO TABS: 10.0000 mg | ORAL_TABLET | Freq: Three times a day (TID) | ORAL | 3 refills | Status: DC

## 2016-06-04 MED ORDER — ISOSORBIDE MONONITRATE ER 30 MG PO TB24
15.0000 mg | ORAL_TABLET | Freq: Every day | ORAL | Status: DC
  Administered 2016-06-04: 15 mg via ORAL
  Filled 2016-06-04: qty 1

## 2016-06-04 MED ORDER — LOSARTAN POTASSIUM 100 MG PO TABS: 100.0000 mg | ORAL_TABLET | Freq: Every day | ORAL | 2 refills | Status: DC

## 2016-06-04 NOTE — Progress Notes (Signed)
Patient Name: [name] Date of Encounter: 06/04/2016  Primary Cardiologist: *Dr. [facility] Problem List     Principal Problem:   Acute on chronic combined systolic and diastolic heart failure ([place]) Active Problems:   HYPERCHOLESTEROLEMIA   Hypertensive cardiomyopathy (HCC)   CKD (chronic kidney disease), stage IV (HCC)   Diabetes mellitus type 2 in nonobese (HCC)   Symptomatic bradycardia   Elevated troponin   Thrombocytopenia ([place])     Subjective   She had brief chest pain early this AM.  It occurred with faster HR.  Inpatient Medications    Scheduled Meds: . amLODipine  10 mg Oral QHS  . calcitRIOL  0.25 mcg Oral Daily  . hydrALAZINE  10 mg Oral Q8H  . insulin aspart  0-5 Units Subcutaneous QHS  . insulin aspart  0-9 Units Subcutaneous TID WC  . insulin glargine  10 Units Subcutaneous QHS  . sodium chloride flush  3 mL Intravenous Q12H   Continuous Infusions:   PRN Meds: sodium chloride, acetaminophen, ondansetron (ZOFRAN) IV, sodium chloride flush, vitamin C   Vital Signs    Vitals:   06/03/16 1502 06/03/16 2005 06/04/16 0528 06/04/16 1311  BP: 113/62 125/76 (!) 150/76 (!) 184/85  Pulse: (!) 54 (!) 52 (!) 55 71  Resp:      Temp: 97.4 F (36.3 C) 98 F (36.7 C) 97.7 F (36.5 C) 97.8 F (36.6 C)  TempSrc: Oral Oral Oral Oral  SpO2: 100% 100% 100% 100%  Weight:   57.4 kg (126 lb 8 oz)   Height:        Intake/Output Summary (Last 24 hours) at 06/04/16 1352 Last data filed at 06/04/16 0944  Gross per 24 hour  Intake              460 ml  Output              300 ml  Net              160 ml   Filed Weights   06/02/16 0526 06/03/16 0500 06/04/16 0528  Weight: 56.8 kg (125 lb 4.8 oz) 57.7 kg (127 lb 3.2 oz) 57.4 kg (126 lb 8 oz)    Physical Exam    GEN: Well nourished, well developed, in no acute distress.  HEENT: Grossly normal.  Neck: Supple, no JVD, carotid bruits, or masses. Cardiac: RRR, 2/6 systolic murmurs, no rubs, or  gallops. No clubbing, cyanosis, edema.  Radials/DP/PT 2+ and equal bilaterally.  Respiratory:  Respirations regular and unlabored, clear to auscultation bilaterally. GI: Soft, nontender, nondistended, BS + x 4. MS: no deformity or atrophy. Skin: warm and dry, no rash. Neuro:  Strength and sensation are intact. Psych: AAOx3.  Normal affect.  Labs    CBC  Recent Labs  06/03/16 0448 06/04/16 0523  WBC 6.0 5.0  HGB 12.4 11.5*  HCT 38.1 35.6*  MCV 85.6 84.6  PLT 137* [id number]*   Basic Metabolic Panel  Recent Labs  06/03/16 0448 06/04/16 0523  NA 141 139  K 4.2 4.0  CL 107 106  CO2 25 26  GLUCOSE 149* 141*  BUN 46* 44*  CREATININE 2.86* 2.40*  CALCIUM 9.8 9.4   Liver Function Tests No results for input(s): AST, ALT, ALKPHOS, BILITOT, PROT, ALBUMIN in the last 72 hours. No results for input(s): LIPASE, AMYLASE in the last 72 hours. Cardiac Enzymes  Recent Labs  06/02/16 0552 06/03/16 0448 06/04/16 1048  TROPONINI 6.43* 4.97* 3.30*  BNP Invalid input(s): POCBNP D-Dimer No results for input(s): DDIMER in the last 72 hours. Hemoglobin A1C No results for input(s): HGBA1C in the last 72 hours. Fasting Lipid Panel No results for input(s): CHOL, HDL, LDLCALC, TRIG, CHOLHDL, LDLDIRECT in the last 72 hours. Thyroid Function Tests No results for input(s): TSH, T4TOTAL, T3FREE, THYROIDAB in the last 72 hours.  Invalid input(s): FREET3  Telemetry    SB, NSR - Personally Reviewed  ECG     Sinus tach , LBBB- Personally Reviewed  Radiology    No results found.  Cardiac Studies   ECG as above; troponin decreasing  Patient Profile     82 y/o with NSTEMI  Assessment & Plan    1) NSTEMI: CP only with tachycardia.  Holding beta blocker due to bradycardia at times.  No clear syncope in the past.  Occasional lightheadedness.  Trying to avoid cath due to acute renal insufficency.  She was treated with 48 hours of heparin.  She did well with walking.  This AM, she had  to go to the bathroom and was nervous while waiting for assistance.  Troponin trending down.  COntinue medical therapy.  Start isosorbide as rate slowing drugs are being held.  Further decisions based on renal function..  If sx persist, could consider cath if Cr decreases.  F/u with Dr. Percival Spanish.  Signed, Larae Grooms, MD  06/04/2016, 1:52 PM

## 2016-06-04 NOTE — Progress Notes (Signed)
CARDIAC REHAB PHASE I   PRE:  Rate/Rhythm: 53 SB    BP: sitting 145/71    SaO2:   MODE:  Ambulation: 550 ft   POST:  Rate/Rhythm: 121 ST    BP: sitting 184/85     SaO2:   Pt ambulated without CP however HR elevated at end of walk, 121 ST with PACs. When questioned, pt sts she could feel her heart beat being fast. Slowly decreased. Pt is brady at rest. Will f/u. [place], ACSM 06/04/2016 12:08 PM

## 2016-06-04 NOTE — Progress Notes (Signed)
During pt rounding, came into room and assisted pt to the bathroom. After returning to the bathroom, pt complained about 8/10 chest pain. Put pt on 2 L of oxygen and obtained an EKG. EKG unchanged. After several minutes, pt chest pain subsided. Pt resting comfortably. Paged on call physician and no new orders received. Will continue to monitor.  [name] E

## 2016-06-04 NOTE — Progress Notes (Signed)
Patient had 5 beat run of vtach this morning while lying in bed.  Harlan Stains PA notified.  Sanda Linger

## 2016-06-04 NOTE — Discharge Summary (Signed)
Physician Discharge Summary  [name] [id number] DOB: 02-Aug-1928 DOA: 05/31/2016  PCP: Binnie Rail, MD  Admit date: 05/31/2016 Discharge date: 06/04/2016  Admitted From: Home Discharge disposition: Home   Recommendations for Outpatient Follow-Up:   1. Recommend close follow-up with cardiologist and nephrologist.  2. Repeat creatinine recommended in the next few days.   Discharge Diagnosis:   Principal Problem:    Acute on chronic combined systolic and diastolic heart failure (HCC) Active Problems:    HYPERCHOLESTEROLEMIA    Hypertensive cardiomyopathy (HCC)    CKD (chronic kidney disease), stage IV (HCC)    Diabetes mellitus type 2 in nonobese (HCC)    Symptomatic bradycardia    Elevated troponin    Thrombocytopenia (HCC)    NSTEMI (non-ST elevated myocardial infarction) [facility])   Discharge Condition: Improved.  Diet recommendation: Low sodium, heart healthy.  Carbohydrate-modified.    History of Present Illness:   The patient is an 82 year old female with a PMH of nonischemic hypertensive cardiomyopathy with chronic combined systolic/diastolic heart failure, stage IV chronic kidney disease, diabetes on oral agents, dyslipidemia and chronic thrombocytopenia who was admitted on 05/31/16 for evaluation of nausea, vomiting, chest pain and shortness of breath accompanied by diaphoresis. Upon initial evaluation in the ED, her troponin was elevated and she had a chronic left bundle branch block noted on EKG. BNP was elevated at 462.   Hospital Course by Problem:   Principal Problems:   NSTEMI/Elevated troponin Troponin reached a high of 6.43 before trending down. Evaluated by cardiologist. Medical management only recommended. Given the patient's stage IV chronic kidney disease, cardiac catheterization was not performed. She received 48 hours of treatment with IV heparin. Imdur prescribed at discharge.  Note: She is allergic to statins, and therefore  statin not ordered!    Acute on chronic combined systolic and diastolic heart failure ([place]) Patient underwent gated nuclear stress in 2016 with estimated EF 24% with a normal perfusion study and no ST segment deviation during the stress portion but study was deemed high risk due to severely reduced systolic function. Appears to be well compensated at discharge.  Active Problems:   HYPERCHOLESTEROLEMIA Allergic to statin therapy. Recommend ongoing efforts for dietary control of lipids.    Hypertensive cardiomyopathy ([place]) Discharged on a combination of hydralazine, amlodipine, Imdur and instructions to resume losartan and 48 hours.    CKD (chronic kidney disease), stage IV ([place]) The patient's creatinine was improving at discharge. Her baseline creatinine appears to be around 2.11. She needs close outpatient follow-up with her nephrologist.    Diabetes mellitus type 2 in nonobese [facility]) Resume home medications at discharge.    Symptomatic bradycardia Beta blocker discontinued. TSH was okay.    Thrombocytopenia (HCC) Mild. No further workup undertaken.   Medical Consultants:    Cardiology   Discharge Exam:   Vitals:   06/04/16 0528 06/04/16 1311  BP: (!) 150/76 (!) 184/85  Pulse: (!) 55 71  Resp:    Temp: 97.7 F (36.5 C) 97.8 F (36.6 C)   Vitals:   06/03/16 1502 06/03/16 2005 06/04/16 0528 06/04/16 1311  BP: 113/62 125/76 (!) 150/76 (!) 184/85  Pulse: (!) 54 (!) 52 (!) 55 71  Resp:      Temp: 97.4 F (36.3 C) 98 F (36.7 C) 97.7 F (36.5 C) 97.8 F (36.6 C)  TempSrc: Oral Oral Oral Oral  SpO2: 100% 100% 100% 100%  Weight:   57.4 kg (126 lb 8 oz)   Height:  General exam: Appears calm and comfortable.  Respiratory system: Clear to auscultation. Respiratory effort normal. Cardiovascular system: S1 & S2 heard, RRR. No JVD,  rubs, gallops or clicks. II/VI SEM. Gastrointestinal system: Abdomen is nondistended, soft and nontender. No organomegaly or masses  felt. Normal bowel sounds heard. Central nervous system: Alert and oriented. No focal neurological deficits. Extremities: No clubbing,  or cyanosis. No edema. Skin: No rashes, lesions or ulcers. Psychiatry: Judgement and insight appear normal. Mood & affect appropriate.    The results of significant diagnostics from this hospitalization (including imaging, microbiology, ancillary and laboratory) are listed below for reference.     Procedures and Diagnostic Studies:   Dg Chest 2 View  Result Date: 06/01/2016 CLINICAL DATA:  Acute on chronic combined systolic and diastolic heart failure. Atrial fibrillation. Coronary artery disease. EXAM: CHEST  2 VIEW COMPARISON:  05/31/2016 FINDINGS: Cardiomegaly stable improved aeration of both lungs is seen. Both lungs are clear. No evidence of pneumothorax or pleural effusion. IMPRESSION: Mild cardiomegaly.  No active lung disease. Electronically Signed   By: Earle Gell M.D.   On: 06/01/2016 08:02   Dg Chest Portable 1 View  Result Date: 05/31/2016 CLINICAL DATA:  Chest pain EXAM: PORTABLE CHEST 1 VIEW COMPARISON:  March 22, 2015 FINDINGS: New infiltrate in the right lung base. Stable cardiomegaly. No other interval changes or acute abnormalities. IMPRESSION: New infiltrate in the right lung base, likely pneumonia or aspiration. Recommend follow-up to resolution. Electronically Signed   By: Dorise Bullion III M.D   On: 05/31/2016 12:32     Labs:   Basic Metabolic Panel:  Recent Labs Lab 05/31/16 1215 06/01/16 0325 06/02/16 0552 06/03/16 0448 06/04/16 0523  NA 140 141 140 141 139  K 4.4 3.7 3.8 4.2 4.0  CL 109 106 105 107 106  CO2 22 26 26 25 26   GLUCOSE 197* 150* 121* 149* 141*  BUN 29* 37* 45* 46* 44*  CREATININE 2.15* 2.37* 2.85* 2.86* 2.40*  CALCIUM 9.1 9.1 9.1 9.8 9.4   GFR Estimated Creatinine Clearance: 13.3 mL/min (by C-G formula based on SCr of 2.4 mg/dL (H)). Liver Function Tests:  Recent Labs Lab 05/31/16 1215  AST 26   ALT 13*  ALKPHOS 56  BILITOT 0.6  PROT 6.6  ALBUMIN 3.3*   Coagulation profile  Recent Labs Lab 05/31/16 1215  INR 0.92    CBC:  Recent Labs Lab 05/31/16 1215 06/02/16 0552 06/03/16 0448 06/04/16 0523  WBC 7.7 5.3 6.0 5.0  NEUTROABS 6.5  --   --   --   HGB 12.2 11.6* 12.4 11.5*  HCT 37.3 35.9* 38.1 35.6*  MCV 85.9 85.5 85.6 84.6  PLT 138* 126* 137* 142*   Cardiac Enzymes:  Recent Labs Lab 05/31/16 2117 06/01/16 0325 06/02/16 0552 06/03/16 0448 06/04/16 1048  TROPONINI 4.19* 6.30* 6.43* 4.97* 3.30*   BNP: Invalid input(s): POCBNP CBG:  Recent Labs Lab 06/03/16 1131 06/03/16 1616 06/03/16 2008 06/04/16 0734 06/04/16 1112  GLUCAP 176* 100* 121* 111* 160*   Microbiology Recent Results (from the past 240 hour(s))  Culture, blood (Routine X 2) w Reflex to ID Panel     Status: None (Preliminary result)   Collection Time: 05/31/16  4:14 PM  Result Value Ref Range Status   Specimen Description BLOOD RIGHT ANTECUBITAL  Final   Special Requests BOTTLES DRAWN AEROBIC AND ANAEROBIC 5CC  Final   Culture NO GROWTH 4 DAYS  Final   Report Status PENDING  Incomplete  Culture, blood (Routine X  2) w Reflex to ID Panel     Status: None (Preliminary result)   Collection Time: 05/31/16  4:27 PM  Result Value Ref Range Status   Specimen Description BLOOD LEFT HAND  Final   Special Requests IN PEDIATRIC BOTTLE 3CC  Final   Culture NO GROWTH 4 DAYS  Final   Report Status PENDING  Incomplete     Discharge Instructions:   Discharge Instructions    (HEART FAILURE PATIENTS) Call MD:  Anytime you have any of the following symptoms: 1) 3 pound weight gain in 24 hours or 5 pounds in 1 week 2) shortness of breath, with or without a dry hacking cough 3) swelling in the hands, feet or stomach 4) if you have to sleep on extra pillows at night in order to breathe.    Complete by:  As directed    Amb Referral to Cardiac Rehabilitation    Complete by:  As directed     Diagnosis:  NSTEMI   Diet - low sodium heart healthy    Complete by:  As directed    Diet Carb Modified    Complete by:  As directed    Discharge instructions    Complete by:  As directed    [place] 06/06/16.  You need to follow up with Dr. Percival Spanish and Dr. Joelyn Oms in 1-2 weeks.   Increase activity slowly    Complete by:  As directed        Medication List    STOP taking these medications   metoprolol succinate 50 MG 24 hr tablet Commonly known as:  TOPROL-XL     TAKE these medications   amLODipine 10 MG tablet Commonly known as:  NORVASC Take 10 mg by mouth at bedtime.   calcitRIOL 0.25 MCG capsule Commonly known as:  ROCALTROL Take 0.25 mcg by mouth daily.   furosemide 20 MG tablet Commonly known as:  LASIX Take 1 tablet (20 mg total) by mouth daily. What changed:  when to take this  reasons to take this   hydrALAZINE 10 MG tablet Commonly known as:  APRESOLINE Take 1 tablet (10 mg total) by mouth every 8 (eight) hours.   isosorbide mononitrate 30 MG 24 hr tablet Commonly known as:  IMDUR Take 0.5 tablets (15 mg total) by mouth daily. Start taking on:  06/05/2016   losartan 100 MG tablet Commonly known as:  COZAAR Take 1 tablet (100 mg total) by mouth daily. RESUME 06/06/16 What changed:  additional instructions   pioglitazone 45 MG tablet Commonly known as:  ACTOS TAKE 1 TABLET (45 MG TOTAL) BY MOUTH DAILY.   repaglinide 2 MG tablet Commonly known as:  PRANDIN Take 2 mg by mouth daily with supper.   vitamin C 1000 MG tablet Take 1,000 mg by mouth daily as needed (FOR IMMUNE).         Time coordinating discharge: > 35 minutes.  Signed:  Chayim Bialas  Pager [phone] [facility] 06/04/2016, 5:39 PM

## 2016-06-04 NOTE — Progress Notes (Signed)
Patient discharged home with daughter. Discharge instructions were reviewed with both patient and daughter. No concerns.

## 2016-06-05 LAB — CULTURE, BLOOD (ROUTINE X 2)
CULTURE: NO GROWTH
CULTURE: NO GROWTH

## 2016-06-14 NOTE — Progress Notes (Signed)
Subjective:    Patient ID: [name], female    DOB: Jun 23, 1928, 82 y.o.   MRN: [id number]  HPI The patient is here for hospital follow up.  She was admitted 05/31/16 - 06/04/16.  She developed nausea, vomiting, chest pain, SOB and diaphoresis while at church.  In the ED her troponin and BNP were slightly elevated.  Her troponin reached 6.43.  Cardiology saw the patient. Cardiac cath was not done due to her stage IV CKD.  She had 48 hours of IV heparin.  She was put on Imdur on discharge.  She had acute on chronic systolic and diastolic heart failure, which was well compensated at discharge.  Her diabetes was stable.    She is taking all her medications as prescribed.  She has been doing well since she was discharged.  When she is getting ready to go someplace she gets palpitations and an achy feeling in the left side of her chest that radiates to her sternum.  She has to stop and sit down or lay down for 3-5 minutes and then it will go away.  She has this yesterday and this morning.    She is not currently taking the ASA 81 mg daily.    She does not have a follow up appointment with cardiology, but does have a follow up with her kidney doctor.   She checks her BP at home 130-140/77-85.  She has not been taking her sugars recently at home.  She is in the process of moving and can not find her glucometer.    Medications and allergies reviewed with patient and updated if appropriate.  Patient Active Problem List   Diagnosis Date Noted  . NSTEMI (non-ST elevated myocardial infarction) ([place])   . Acute on chronic combined systolic and diastolic heart failure ([place]) 05/31/2016  . Symptomatic bradycardia 05/31/2016  . Elevated troponin 05/31/2016  . Thrombocytopenia ([place]) 05/31/2016  . Neuropathy ([place]) 04/30/2016  . Asymptomatic menopausal state 04/30/2016  . Pulled hamstring 01/17/2016  . Diabetes mellitus type 2 in nonobese ([place]) 12/25/2015  . PAROXYSMAL ATRIAL FIBRILLATION  09/30/2009  . CKD (chronic kidney disease), stage IV ([place]) 08/05/2009  . PULMONARY EMBOLISM 07/03/2009  . GOITER, MULTINODULAR 05/11/2009  . PEPTIC ULCER DISEASE, HELICOBACTER PYLORI POSITIVE 03/11/2009  . Coronary atherosclerosis 11/29/2008  . Hypertensive cardiomyopathy ([place]) 11/12/2008  . BENIGN NEOPLASM OF ADRENAL GLAND 06/20/2008  . DIVERTICULOSIS OF COLON 06/20/2008  . ARTERIOVENOUS MALFORMATION 06/20/2008  . HYPERCHOLESTEROLEMIA 05/10/2008  . Essential hypertension 05/10/2008  . TRANSIENT ISCHEMIC ATTACK 05/10/2008  . GERD 05/10/2008  . NEPHROLITHIASIS 05/10/2008    Current Outpatient Prescriptions on File Prior to Visit  Medication Sig Dispense Refill  . amLODipine (NORVASC) 10 MG tablet Take 10 mg by mouth at bedtime.    . Ascorbic Acid (VITAMIN C) 1000 MG tablet Take 1,000 mg by mouth daily as needed (FOR IMMUNE).    . calcitRIOL (ROCALTROL) 0.25 MCG capsule Take 0.25 mcg by mouth daily.    . furosemide (LASIX) 20 MG tablet Take 1 tablet (20 mg total) by mouth daily. (Patient taking differently: Take 20 mg by mouth daily as needed for fluid. ) 30 tablet 3  . hydrALAZINE (APRESOLINE) 10 MG tablet Take 1 tablet (10 mg total) by mouth every 8 (eight) hours. 90 tablet 3  . isosorbide mononitrate (IMDUR) 30 MG 24 hr tablet Take 0.5 tablets (15 mg total) by mouth daily. 30 tablet 2  . losartan (COZAAR) 100 MG tablet Take 1 tablet (  100 mg total) by mouth daily. RESUME 06/06/16 90 tablet 2  . pioglitazone (ACTOS) 45 MG tablet TAKE 1 TABLET (45 MG TOTAL) BY MOUTH DAILY. 30 tablet 2  . repaglinide (PRANDIN) 2 MG tablet Take 2 mg by mouth daily with supper.     No current facility-administered medications on file prior to visit.     Past Medical History:  Diagnosis Date  . Anxiety   . Benign neoplasm of adrenal gland   . CAD (coronary artery disease)    nonobst on cath 2010  . Calculus of kidney   . Cardiomyopathy    hypertensive  . Diabetes mellitus    hx noncompliance with  rx'd tx  . Diverticulosis of colon (without mention of hemorrhage)   . DJD (degenerative joint disease)   . GERD (gastroesophageal reflux disease)    hx DU with H pylori 01/2009 and GIB, s/p triple rx  . GI bleed 2012   duoedonal ulcer  . H. pylori infection   . Hx of subarachnoid hemorrhage 2001  . Hypercholesterolemia   . Hypertension   . LBBB (left bundle branch block)   . Nontoxic multinodular goiter   . Paroxysmal atrial fibrillation (HCC)    not anticoag candidate  . Pulmonary embolism ([place]) 02/2009   on anticoag thru 11/2010 - stopped due to risk>benefit  . Pure hypercholesterolemia   . Tubulovillous adenoma of colon 2012    Past Surgical History:  Procedure Laterality Date  . CATARACT EXTRACTION    . KNEE SURGERY     left  . SHOULDER SURGERY     right  . TONSILLECTOMY      Social History   Social History  . Marital status: Widowed    Spouse name: N/A  . Number of children: 1  . Years of education: N/A   Occupational History  . retired Retired   Social History Main Topics  . Smoking status: Former Smoker    Packs/day: 0.30    Years: 36.00    Types: Cigarettes    Quit date: 08/18/1983  . Smokeless tobacco: Never Used  . Alcohol use 0.6 oz/week    1 Glasses of wine per week     Comment: occ wine, beer during holidays or at social events  . Drug use: No  . Sexual activity: Not Currently   Other Topics Concern  . Not on file   Social History Narrative   Widow, 1 daughter    Family History  Problem Relation Age of Onset  . Lung cancer Mother   . Brain cancer Mother   . Goiter Mother   . Cancer Mother   . Heart disease Mother   . Diabetes Mother   . Diabetes Maternal Grandmother   . Heart disease Maternal Aunt     x2  . Colon cancer Neg Hx     Review of Systems  Constitutional: Negative for chills and fever.  Respiratory: Positive for shortness of breath (when walking fast/up an incline/stairs). Negative for cough and wheezing.     Cardiovascular: Positive for chest pain, palpitations and leg swelling (occ after a long day; lasix prn).  Neurological: Positive for light-headedness (sometimes, getting up quickly) and headaches (occ).       Objective:   Vitals:   06/15/16 1313  BP: (!) 162/104  Pulse: 62  Resp: 18  Temp: 97.6 F (36.4 C)   Filed Weights   06/15/16 1313  Weight: 126 lb (57.2 kg)   Body mass index is 23.05  kg/m.   Physical Exam    Constitutional: Appears well-developed and well-nourished. No distress.  HENT:  Head: Normocephalic and atraumatic.  Neck: Neck supple. No tracheal deviation present. No thyromegaly present.  No cervical lymphadenopathy Cardiovascular: Normal rate, regular rhythm and normal heart sounds.   1/6 systolic murmur heard. No carotid bruit .  No edema Pulmonary/Chest: Effort normal and breath sounds normal. No respiratory distress. No has no wheezes. No rales.  Skin: Skin is warm and dry. Not diaphoretic.  Psychiatric: Normal mood and affect. Behavior is normal.      Assessment & Plan:   Samples of ASA 81 mg given - advised to take one daily   See Problem List for Assessment and Plan of chronic medical problems.   Follow up in December

## 2016-06-15 ENCOUNTER — Ambulatory Visit (INDEPENDENT_AMBULATORY_CARE_PROVIDER_SITE_OTHER): Payer: Medicare Other | Admitting: Internal Medicine

## 2016-06-15 ENCOUNTER — Encounter: Payer: Self-pay | Admitting: Internal Medicine

## 2016-06-15 VITALS — BP 162/104 | HR 62 | Temp 97.6°F | Resp 18 | Ht 62.0 in | Wt 126.0 lb

## 2016-06-15 DIAGNOSIS — N184 Chronic kidney disease, stage 4 (severe): Secondary | ICD-10-CM

## 2016-06-15 DIAGNOSIS — I5043 Acute on chronic combined systolic (congestive) and diastolic (congestive) heart failure: Secondary | ICD-10-CM

## 2016-06-15 DIAGNOSIS — I214 Non-ST elevation (NSTEMI) myocardial infarction: Secondary | ICD-10-CM

## 2016-06-15 DIAGNOSIS — I10 Essential (primary) hypertension: Secondary | ICD-10-CM

## 2016-06-15 DIAGNOSIS — I25119 Atherosclerotic heart disease of native coronary artery with unspecified angina pectoris: Secondary | ICD-10-CM

## 2016-06-15 MED ORDER — NITROGLYCERIN 0.4 MG SL SUBL: 0.4000 mg | SUBLINGUAL_TABLET | SUBLINGUAL | 0 refills | Status: DC | PRN

## 2016-06-15 MED ORDER — ASPIRIN EC 81 MG PO TBEC: 81.0000 mg | DELAYED_RELEASE_TABLET | Freq: Every day | ORAL | Status: AC

## 2016-06-15 MED ORDER — ISOSORBIDE MONONITRATE ER 30 MG PO TB24: 30.0000 mg | ORAL_TABLET | Freq: Every day | ORAL | 2 refills | Status: DC

## 2016-06-15 NOTE — Progress Notes (Signed)
Pre visit review using our clinic review tool, if applicable. No additional management support is needed unless otherwise documented below in the visit note. 

## 2016-06-15 NOTE — Patient Instructions (Addendum)
Call Dr Warren Lacy and make a follow up appointment.   Follow up with your kidney doctor.    Medications reviewed and updated.  Changes include starting the baby aspirin, increasing the imdur to 30 mg daily and having nitroglycerin at home if needed.  If you have headaches or other side effects please call immediately.   Your prescription(s) have been submitted to your pharmacy. Please take as directed and contact our office if you believe you are having problem(s) with the medication(s).   Please followup 07/20/16

## 2016-06-15 NOTE — Assessment & Plan Note (Signed)
euvolemic on exam Takes lasix as needed for leg swelling

## 2016-06-15 NOTE — Assessment & Plan Note (Signed)
Having angina on a daily basis - most recent episode today Will call Cardio and make a follow up appt Restart ASA 81 mg daily - samples given Increase imdur to 30 mg daily - advised she may have headaches and may not tolerate higher dose Monitor BP at home NTG at home prn

## 2016-06-15 NOTE — Assessment & Plan Note (Signed)
Kidney function stable Will follow up with [facility] as scheduled

## 2016-06-15 NOTE — Assessment & Plan Note (Signed)
BP elevated here today - she was almost in an accident on the way here Well controlled here Increase imdur for angina to 30 mg daily Continue other medications

## 2016-06-15 NOTE — Assessment & Plan Note (Signed)
Recent NSTEMI Will follow up with cardiology Restart ASA 81 mg - has not been taking

## 2016-06-16 ENCOUNTER — Telehealth: Payer: Self-pay | Admitting: Cardiology

## 2016-06-17 ENCOUNTER — Ambulatory Visit (INDEPENDENT_AMBULATORY_CARE_PROVIDER_SITE_OTHER): Payer: Medicare Other | Admitting: Student

## 2016-06-17 ENCOUNTER — Encounter: Payer: Self-pay | Admitting: Student

## 2016-06-17 VITALS — BP 168/99 | HR 83 | Ht 60.0 in | Wt 126.8 lb

## 2016-06-17 DIAGNOSIS — N184 Chronic kidney disease, stage 4 (severe): Secondary | ICD-10-CM

## 2016-06-17 DIAGNOSIS — I10 Essential (primary) hypertension: Secondary | ICD-10-CM

## 2016-06-17 DIAGNOSIS — I214 Non-ST elevation (NSTEMI) myocardial infarction: Secondary | ICD-10-CM

## 2016-06-17 DIAGNOSIS — I5042 Chronic combined systolic (congestive) and diastolic (congestive) heart failure: Secondary | ICD-10-CM

## 2016-06-17 DIAGNOSIS — I447 Left bundle-branch block, unspecified: Secondary | ICD-10-CM | POA: Diagnosis not present

## 2016-06-17 DIAGNOSIS — E785 Hyperlipidemia, unspecified: Secondary | ICD-10-CM

## 2016-06-17 DIAGNOSIS — I428 Other cardiomyopathies: Secondary | ICD-10-CM

## 2016-06-17 NOTE — Telephone Encounter (Signed)
Closed encounter °

## 2016-06-17 NOTE — Patient Instructions (Signed)
Medication Instructions: Increase Isosorbide to 60 mg daily--start this on 06/24/16   Follow-Up: Your physician recommends that you schedule a follow-up appointment in: 2-3 months with Dr. Percival Spanish.  If you need a refill on your cardiac medications before your next appointment, please call your pharmacy.

## 2016-06-17 NOTE — Progress Notes (Signed)
Cardiology Office Note    Date:  06/17/2016   ID:  [name], DOB 1928/04/20, MRN [id number]  PCP:  Binnie Rail, MD  Cardiologist: Dr. Percival Spanish  Chief Complaint  Patient presents with  . Follow-up    Hospital Follow-Up    History of Present Illness:    [name] is a 82 y.o. female with past medical history of NICM (EF 30-35% by echo in 05/2016), chronic combined systolic and diastolic CHF, nonobstructive CAD (by cath in 2010), sinus arhythmia, HTN, HLD, Stage 4 CKD, and chronic LBBB who presents to the office today for hospital follow-up.   Was recently admitted from 10/15 - 06/04/2016 for acute CHF exacerbation and NSTEMI. She developed chest pressure earlier that morning while at church which was associated with diaphoresis. Was hypertensive with BP of 210/120 upon arrival to the ED. Troponin values peaked at 6.43 but she denied any recurrent episodes of chest discomfort. Was treated medically with IV Heparin in the setting of her Stage 4 CKD as it was thought the risks of a catheterization outweighed the benefits. An echocardiogram was performed and showed an EF of 30-35% with diffuse HK, mild MR, and mildly dilated LA. EF was previously 35-40% in 03/2015. Her BB was discontinued secondary to episodes of bradycardia. Was continued on Amlodipine 10mg  daily, Lasix 20mg  daily, Hydralaine 10mg  Q8H, Losartan 100mg  daily, and Imdur 15mg  daily. Not started on statin therapy due to past intolerance. Creatinine was 2.40 at time of discharge.  In the past few weeks, she reports having episodes of chest pressure with exertion. Says she can be getting dressed in the morning and develop a pressure that resolves with her sitting down and resting.This does not always occur with exertion, for she can walk around her home or outside without any symptoms at times. Thinks this has been exacerbated due to her packing items up due to moving out of her home. No associated dyspnea,  diaphoresis, nausea, or vomiting. Carries SL NTG with her but has not taken this, as she prefers to sit down and rest with improvement of her symptoms instead of taking an additional medication.  Was just seen by her PCP two days ago and Imdur was increased from 15mg  daily to 30mg  daily. Reports a mild headache with this. Checks BP at home and says SBP is usually in the 130's - 140's. Says this is elevated when at provider's office due to her being "nervous" and in traffic on the way here. Weights have been stable. Denies any lower extremity edema, orthopnea, or PND.    Past Medical History:  Diagnosis Date  . Anxiety   . Benign neoplasm of adrenal gland   . CAD (coronary artery disease)    a. nonobstructive disease by cath in 2010. b. NSTEMI 05/2016: cath not performed secondary to Stage 4 CKD  . Calculus of kidney   . Diabetes mellitus    hx noncompliance with rx'd tx  . Diverticulosis of colon (without mention of hemorrhage)   . DJD (degenerative joint disease)   . GERD (gastroesophageal reflux disease)    hx DU with H pylori 01/2009 and GIB, s/p triple rx  . GI bleed 2012   duoedonal ulcer  . H. pylori infection   . Hx of subarachnoid hemorrhage 2001  . Hypercholesterolemia   . Hypertension   . LBBB (left bundle branch block)   . Nonischemic cardiomyopathy ([place])    hypertensive  . Nontoxic multinodular goiter   . Paroxysmal  atrial fibrillation (HCC)    not anticoag candidate  . Pulmonary embolism ([place]) 02/2009   on anticoag thru 11/2010 - stopped due to risk>benefit  . Pure hypercholesterolemia   . Tubulovillous adenoma of colon 2012    Past Surgical History:  Procedure Laterality Date  . CATARACT EXTRACTION    . KNEE SURGERY     left  . SHOULDER SURGERY     right  . TONSILLECTOMY      Current Medications: Outpatient Medications Prior to Visit  Medication Sig Dispense Refill  . amLODipine (NORVASC) 10 MG tablet Take 10 mg by mouth at bedtime.    . Ascorbic Acid  (VITAMIN C) 1000 MG tablet Take 1,000 mg by mouth daily as needed (FOR IMMUNE).    Marland Kitchen aspirin EC 81 MG tablet Take 1 tablet (81 mg total) by mouth daily.    . calcitRIOL (ROCALTROL) 0.25 MCG capsule Take 0.25 mcg by mouth daily.    . furosemide (LASIX) 20 MG tablet Take 1 tablet (20 mg total) by mouth daily. (Patient taking differently: Take 20 mg by mouth daily as needed for fluid. ) 30 tablet 3  . hydrALAZINE (APRESOLINE) 10 MG tablet Take 1 tablet (10 mg total) by mouth every 8 (eight) hours. 90 tablet 3  . isosorbide mononitrate (IMDUR) 30 MG 24 hr tablet Take 1 tablet (30 mg total) by mouth daily. 30 tablet 2  . losartan (COZAAR) 100 MG tablet Take 1 tablet (100 mg total) by mouth daily. RESUME 06/06/16 90 tablet 2  . nitroGLYCERIN (NITROSTAT) 0.4 MG SL tablet Place 1 tablet (0.4 mg total) under the tongue every 5 (five) minutes as needed for chest pain. 50 tablet 0  . pioglitazone (ACTOS) 45 MG tablet TAKE 1 TABLET (45 MG TOTAL) BY MOUTH DAILY. 30 tablet 2  . repaglinide (PRANDIN) 2 MG tablet Take 2 mg by mouth daily with supper.     No facility-administered medications prior to visit.      Allergies:   Statins; Lisinopril; and Penicillins   Social History   Social History  . Marital status: Widowed    Spouse name: N/A  . Number of children: 1  . Years of education: N/A   Occupational History  . retired Retired   Social History Main Topics  . Smoking status: Former Smoker    Packs/day: 0.30    Years: 36.00    Types: Cigarettes    Quit date: 08/18/1983  . Smokeless tobacco: Never Used  . Alcohol use 0.6 oz/week    1 Glasses of wine per week     Comment: occ wine, beer during holidays or at social events  . Drug use: No  . Sexual activity: Not Currently   Other Topics Concern  . None   Social History Narrative   Widow, 1 daughter     Family History:  The patient's family history includes Brain cancer in her mother; Cancer in her mother; Diabetes in her maternal  grandmother and mother; Goiter in her mother; Heart disease in her maternal aunt and mother; Lung cancer in her mother.   Review of Systems:   Please see the history of present illness.     General:  No chills, fever, night sweats or weight changes.  Cardiovascular:  No dyspnea on exertion, edema, orthopnea, palpitations, paroxysmal nocturnal dyspnea. Positive for chest pain. Dermatological: No rash, lesions/masses Respiratory: No cough, dyspnea Urologic: No hematuria, dysuria Abdominal:   No nausea, vomiting, diarrhea, bright red blood per rectum, melena, or hematemesis  Neurologic:  No visual changes, wkns, changes in mental status. All other systems reviewed and are otherwise negative except as noted above.   Physical Exam:    VS:  BP (!) 168/99   Pulse 83   Ht 5' (1.524 m)   Wt 126 lb 12.8 oz (57.5 kg)   BMI 24.76 kg/m   (Recheck BP 142/88) General: Elderly African American female appearing in no acute distress. Head: Normocephalic, atraumatic, sclera non-icteric, no xanthomas, nares are without discharge.  Neck: No carotid bruits. JVD not elevated.  Lungs: Respirations regular and unlabored, without wheezes or rales.  Heart: Regular rate and rhythm. No S3 or S4.  No murmur, no rubs, or gallops appreciated. Abdomen: Soft, non-tender, non-distended with normoactive bowel sounds. No hepatomegaly. No rebound/guarding. No obvious abdominal masses. Msk:  Strength and tone appear normal for age. No joint deformities or effusions. Extremities: No clubbing or cyanosis. No edema.  Distal pedal pulses are 2+ bilaterally. Neuro: Alert and oriented X 3. Moves all extremities spontaneously. No focal deficits noted. Psych:  Responds to questions appropriately with a normal affect. Skin: No rashes or lesions noted  Wt Readings from Last 3 Encounters:  06/17/16 126 lb 12.8 oz (57.5 kg)  06/15/16 126 lb (57.2 kg)  06/04/16 126 lb 8 oz (57.4 kg)     Studies/Labs Reviewed:   EKG:  EKG is  ordered today. The ekg ordered today demonstrates NSR, HR 83, with known LBBB. No acute changes when compared to previous tracing.  Recent Labs: 05/31/2016: ALT 13; B Natriuretic Peptide 462.4 06/01/2016: TSH 0.958 06/04/2016: BUN 44; Creatinine, Ser 2.40; Hemoglobin 11.5; Platelets 142; Potassium 4.0; Sodium 139   Lipid Panel    Component Value Date/Time   CHOL 218 (H) 07/24/2015 1223   TRIG 105.0 07/24/2015 1223   HDL 75.00 07/24/2015 1223   CHOLHDL 3 07/24/2015 1223   VLDL 21.0 07/24/2015 1223   LDLCALC 122 (H) 07/24/2015 1223   LDLDIRECT 135.7 04/26/2013 1008    Additional studies/ records that were reviewed today include:   Echocardiogram: 06/01/2016 Study Conclusions  - Left ventricle: The cavity size was moderately dilated. Wall   thickness was normal. Systolic function was moderately to   severely reduced. The estimated ejection fraction was in the   range of 30% to 35%. Diffuse hypokinesis. Left ventricular   diastolic function parameters were normal. - Mitral valve: There was mild regurgitation. - Left atrium: The atrium was mildly dilated. - Atrial septum: Atrial septum thinned and bowed to right cannot   r/o PFO.  Assessment:    1. Non-ST elevation myocardial infarction (NSTEMI), subsequent episode of care ([place])   2. Nonischemic cardiomyopathy ([place])   3. Chronic combined systolic and diastolic CHF (congestive heart failure) ([place])   4. LBBB (left bundle branch block)   5. Essential hypertension   6. Hyperlipidemia, unspecified hyperlipidemia type   7. CKD (chronic kidney disease) stage 4, GFR 15-29 ml/min (HCC)      Plan:   In order of problems listed above:  1. Subsequent Encounter for NSTEMI - had nonobstructive CAD by cath in 2010 and cardiomyopathy was thought to be secondary to uncontrolled HTN. Recently admitted in 05/2016 for CHF exacerbation and NSTEMI. Troponin peaked at 6.43 and this was treated medically with IV Heparin in the setting of her  Stage 4 CKD due to the increased risk of contrast induced nephropathy. - reports occasional episodes of chest discomfort with exertion, not occurring in any particular pattern. Lasts just a few minutes  and resolves with rest. Has not taken SL NTG. No associated dyspnea, diaphoresis, nausea, or vomiting. EKG today shows known LBBB. - Imdur recently increased from 15mg  daily to 30mg  daily by PCP. Instructed the patient to increase this to 60mg  daily next week if able to tolerate dose increase and no significant headaches.  - continue ASA, Amlodipine, and Imdur. No BB secondary to bradycardia. Intolerant to statin therapy.  2. Nonischemic Cardiomyopathy/ Chronic Combined Systolic and Diastolic CHF - recent echocardiogram in 05/2016 showed an EF of 30-35% with diffuse HK, mild MR, and mildly dilated LA. EF was previously 35-40% in 03/2015. - does not appear volume overloaded on exam. Denies any edema, orthopnea, or PND. - continue with PRN Lasix and Losartan. Not on BB secondary to bradycardia. No Spironolactone with Stage 4 CKD.  3. LBBB - known LBBB. Today's EKG similar to previous tracings.  4. HTN - BP initially checked and at 168/99 today. Rechecked and 142/88. - continue with Amlodipine, Hydralazine, Losartan, and Imdur. Increase Imdur from 30mg  daily to 60mg  daily as above.   5. HLD - followed by PCP. Intolerant to statin therapy. Continue with diet changes and consider addition of Zetia if LDL remains > 100.  6. Stage 4 CKD - creatinine variable between 2.1 - 2.8 this year. - has upcoming appointment with Nephrologist.   Medication Adjustments/Labs and Tests Ordered: Current medicines are reviewed at length with the patient today.  Concerns regarding medicines are outlined above.  Medication changes, Labs and Tests ordered today are listed in the Patient Instructions below. Patient Instructions   Medication Instructions: Increase Isosorbide to 60 mg daily--start this on  06/24/16  Follow-Up: Your physician recommends that you schedule a follow-up appointment in: 2-3 months with Dr. Percival Spanish.  If you need a refill on your cardiac medications before your next appointment, please call your pharmacy.    Arna Medici, PA  06/17/2016 3:08 PM    [facility], [place], [place]  [address] Phone: [phone]; Fax: [phone]  [address] [place], [place] [address] Phone: [phone]

## 2016-07-06 ENCOUNTER — Telehealth: Payer: Self-pay | Admitting: Emergency Medicine

## 2016-07-06 NOTE — Telephone Encounter (Signed)
[employer] called and pt is only taking 30 mg of her isosorbide mononitrate. She states it gives her a headach so she hasnt increased it to 60 mg. Just an FYI thanks.

## 2016-07-07 NOTE — Telephone Encounter (Signed)
Is this okay or should pt come in to discuss changing meds.

## 2016-07-08 NOTE — Telephone Encounter (Signed)
She can let cardiology know - it is ok as long as she is not having chest pain.

## 2016-07-08 NOTE — Telephone Encounter (Signed)
Tried contacting pt, no answer.

## 2016-07-20 ENCOUNTER — Ambulatory Visit (INDEPENDENT_AMBULATORY_CARE_PROVIDER_SITE_OTHER): Payer: Medicare Other | Admitting: Internal Medicine

## 2016-07-20 ENCOUNTER — Encounter: Payer: Self-pay | Admitting: Internal Medicine

## 2016-07-20 ENCOUNTER — Other Ambulatory Visit (INDEPENDENT_AMBULATORY_CARE_PROVIDER_SITE_OTHER): Payer: Medicare Other

## 2016-07-20 VITALS — BP 140/90 | HR 87 | Wt 127.0 lb

## 2016-07-20 DIAGNOSIS — I25119 Atherosclerotic heart disease of native coronary artery with unspecified angina pectoris: Secondary | ICD-10-CM | POA: Diagnosis not present

## 2016-07-20 DIAGNOSIS — I10 Essential (primary) hypertension: Secondary | ICD-10-CM

## 2016-07-20 DIAGNOSIS — E119 Type 2 diabetes mellitus without complications: Secondary | ICD-10-CM

## 2016-07-20 DIAGNOSIS — N184 Chronic kidney disease, stage 4 (severe): Secondary | ICD-10-CM

## 2016-07-20 DIAGNOSIS — E7800 Pure hypercholesterolemia, unspecified: Secondary | ICD-10-CM

## 2016-07-20 DIAGNOSIS — I110 Hypertensive heart disease with heart failure: Secondary | ICD-10-CM

## 2016-07-20 DIAGNOSIS — I43 Cardiomyopathy in diseases classified elsewhere: Secondary | ICD-10-CM

## 2016-07-20 LAB — COMPREHENSIVE METABOLIC PANEL
ALK PHOS: 60 U/L (ref 39–117)
ALT: 36 U/L — AB (ref 0–35)
AST: 35 U/L (ref 0–37)
Albumin: 3.8 g/dL (ref 3.5–5.2)
BILIRUBIN TOTAL: 0.5 mg/dL (ref 0.2–1.2)
BUN: 35 mg/dL — AB (ref 6–23)
CO2: 24 meq/L (ref 19–32)
Calcium: 9.2 mg/dL (ref 8.4–10.5)
Chloride: 108 mEq/L (ref 96–112)
Creatinine, Ser: 2.17 mg/dL — ABNORMAL HIGH (ref 0.40–1.20)
GFR: 27.61 mL/min — ABNORMAL LOW (ref >60.00)
GLUCOSE: 156 mg/dL — AB (ref 70–99)
Potassium: 4.1 mEq/L (ref 3.5–5.1)
SODIUM: 139 meq/L (ref 135–145)
TOTAL PROTEIN: 7 g/dL (ref 6.0–8.3)

## 2016-07-20 LAB — LIPID PANEL
CHOL/HDL RATIO: 2
Cholesterol: 215 mg/dL — ABNORMAL HIGH (ref 0–200)
HDL: 95.1 mg/dL (ref >39.00)
LDL Cholesterol: 101 mg/dL — ABNORMAL HIGH (ref 0–99)
NONHDL: 119.51
Triglycerides: 92 mg/dL (ref 0.0–149.0)
VLDL: 18.4 mg/dL (ref 0.0–40.0)

## 2016-07-20 LAB — MICROALBUMIN / CREATININE URINE RATIO
CREATININE, U: 60 mg/dL
MICROALB UR: 89.8 mg/dL — AB (ref 0.0–1.9)
Microalb Creat Ratio: 149.6 mg/g — ABNORMAL HIGH (ref 0.0–30.0)

## 2016-07-20 LAB — TSH: TSH: 1.44 u[IU]/mL (ref 0.35–4.50)

## 2016-07-20 LAB — HEMOGLOBIN A1C: HEMOGLOBIN A1C: 6.9 % — AB (ref 4.6–6.5)

## 2016-07-20 NOTE — Assessment & Plan Note (Addendum)
Management per Dr Loanne Drilling Getting blood work so will check a1c - good control for her with last check in October Not checking her sugars - needs to find her monitor

## 2016-07-20 NOTE — Patient Instructions (Signed)
Start monitoring your BP at home or at the pharmacy.  Test(s) ordered today. Your results will be released to [facility] (or called to you) after review, usually within 72hours after test completion. If any changes need to be made, you will be notified at that same time.  All other Health Maintenance issues reviewed.   All recommended immunizations and age-appropriate screenings are up-to-date or discussed.  No immunizations administered today.   Medications reviewed and updated.  No changes recommended at this time.   Please followup in 3 months

## 2016-07-20 NOTE — Assessment & Plan Note (Signed)
euvolemic on exam - taking lasix only as needed Check labs

## 2016-07-20 NOTE — Assessment & Plan Note (Addendum)
Having symptoms of ischemia/angina Did not tolerate Imdur at 60 mg daily Advised to call cardio - see if revision of medication would help Advised her to rest if symptomatic continue ASA 81, current meds

## 2016-07-20 NOTE — Progress Notes (Signed)
Subjective:    Patient ID: [name], female    DOB: 1928-01-23, 82 y.o.   MRN: [id number]  HPI The patient is here for follow up.  One day last week she was having a funny pain form her left elbow to wrist.  It was a "funny tingling" pain.  It was intermittent all day.  She has not had it since then.  She was cleaning a mirror when this happened.  She felt short winded with the pain.  She rested.  By the time she went to bed the pain stopped. Today she parked in the parking lot at the bottom of a slight incline.  She was rushing and had to stop.  By the time she got to the office she had to go very slow and had to lean on the counter when she got here.  She felt like she was not getting enough air in her lungs.  She feels like she has to push to get things done.  She does not feel like she has her energy.  She is aggravated by her limitations.  She gets palpitations with exertion.   CAD, recent NSTEMI, pAfib, CHF, Hypertension: She is taking her medication daily. She is compliant with a low sodium diet.  She denies chest pain, edema,  and regular headaches, but has had the above symptoms. She is very active, but not exercising regularly.  She does monitor her blood pressure at home with a wrist monitor but has not checked it recently because she is in the process of moving and can not find her monitor.    Diabetes: She is taking her medication daily as prescribed. She is compliant with a diabetic diet. She is very active.  She is following with Dr Loanne Drilling.    CKD:  She is following with nephrology.   Medications and allergies reviewed with patient and updated if appropriate.  Patient Active Problem List   Diagnosis Date Noted  . Acute on chronic combined systolic and diastolic heart failure ([place]) 05/31/2016  . Symptomatic bradycardia 05/31/2016  . Elevated troponin 05/31/2016  . Thrombocytopenia ([place]) 05/31/2016  . Neuropathy ([place]) 04/30/2016  . Pulled hamstring 01/17/2016  .  Diabetes mellitus type 2 in nonobese ([place]) 12/25/2015  . PAROXYSMAL ATRIAL FIBRILLATION 09/30/2009  . CKD (chronic kidney disease), stage IV ([place]) 08/05/2009  . PULMONARY EMBOLISM 07/03/2009  . GOITER, MULTINODULAR 05/11/2009  . PEPTIC ULCER DISEASE, HELICOBACTER PYLORI POSITIVE 03/11/2009  . Coronary atherosclerosis 11/29/2008  . Hypertensive cardiomyopathy ([place]) 11/12/2008  . BENIGN NEOPLASM OF ADRENAL GLAND 06/20/2008  . DIVERTICULOSIS OF COLON 06/20/2008  . ARTERIOVENOUS MALFORMATION 06/20/2008  . HYPERCHOLESTEROLEMIA 05/10/2008  . Essential hypertension 05/10/2008  . TRANSIENT ISCHEMIC ATTACK 05/10/2008  . NEPHROLITHIASIS 05/10/2008    Current Outpatient Prescriptions on File Prior to Visit  Medication Sig Dispense Refill  . amLODipine (NORVASC) 10 MG tablet Take 10 mg by mouth at bedtime.    . Ascorbic Acid (VITAMIN C) 1000 MG tablet Take 1,000 mg by mouth daily as needed (FOR IMMUNE).    Marland Kitchen aspirin EC 81 MG tablet Take 1 tablet (81 mg total) by mouth daily.    . calcitRIOL (ROCALTROL) 0.25 MCG capsule Take 0.25 mcg by mouth daily.    . furosemide (LASIX) 20 MG tablet Take 1 tablet (20 mg total) by mouth daily. (Patient taking differently: Take 20 mg by mouth daily as needed for fluid. ) 30 tablet 3  . hydrALAZINE (APRESOLINE) 10 MG tablet Take 1 tablet (  10 mg total) by mouth every 8 (eight) hours. 90 tablet 3  . isosorbide mononitrate (IMDUR) 30 MG 24 hr tablet Take 1 tablet (30 mg total) by mouth daily. 30 tablet 2  . losartan (COZAAR) 100 MG tablet Take 1 tablet (100 mg total) by mouth daily. RESUME 06/06/16 90 tablet 2  . nitroGLYCERIN (NITROSTAT) 0.4 MG SL tablet Place 1 tablet (0.4 mg total) under the tongue every 5 (five) minutes as needed for chest pain. 50 tablet 0  . pioglitazone (ACTOS) 45 MG tablet TAKE 1 TABLET (45 MG TOTAL) BY MOUTH DAILY. 30 tablet 2  . repaglinide (PRANDIN) 2 MG tablet Take 2 mg by mouth daily with supper.     No current facility-administered  medications on file prior to visit.     Past Medical History:  Diagnosis Date  . Anxiety   . Benign neoplasm of adrenal gland   . CAD (coronary artery disease)    a. nonobstructive disease by cath in 2010. b. NSTEMI 05/2016: cath not performed secondary to Stage 4 CKD  . Calculus of kidney   . Diabetes mellitus    hx noncompliance with rx'd tx  . Diverticulosis of colon (without mention of hemorrhage)   . DJD (degenerative joint disease)   . GERD (gastroesophageal reflux disease)    hx DU with H pylori 01/2009 and GIB, s/p triple rx  . GI bleed 2012   duoedonal ulcer  . H. pylori infection   . Hx of subarachnoid hemorrhage 2001  . Hypercholesterolemia   . Hypertension   . LBBB (left bundle branch block)   . Nonischemic cardiomyopathy ([place])    hypertensive  . Nontoxic multinodular goiter   . Paroxysmal atrial fibrillation (HCC)    not anticoag candidate  . Pulmonary embolism ([place]) 02/2009   on anticoag thru 11/2010 - stopped due to risk>benefit  . Pure hypercholesterolemia   . Tubulovillous adenoma of colon 2012    Past Surgical History:  Procedure Laterality Date  . CATARACT EXTRACTION    . KNEE SURGERY     left  . SHOULDER SURGERY     right  . TONSILLECTOMY      Social History   Social History  . Marital status: Widowed    Spouse name: N/A  . Number of children: 1  . Years of education: N/A   Occupational History  . retired Retired   Social History Main Topics  . Smoking status: Former Smoker    Packs/day: 0.30    Years: 36.00    Types: Cigarettes    Quit date: 08/18/1983  . Smokeless tobacco: Never Used  . Alcohol use 0.6 oz/week    1 Glasses of wine per week     Comment: occ wine, beer during holidays or at social events  . Drug use: No  . Sexual activity: Not Currently   Other Topics Concern  . Not on file   Social History Narrative   Widow, 1 daughter    Family History  Problem Relation Age of Onset  . Lung cancer Mother   . Brain cancer  Mother   . Goiter Mother   . Cancer Mother   . Heart disease Mother   . Diabetes Mother   . Diabetes Maternal Grandmother   . Heart disease Maternal Aunt     x2  . Colon cancer Neg Hx     Review of Systems  Constitutional: Negative for chills and fever.  Respiratory: Positive for shortness of breath (with exertion). Negative  for cough and wheezing.   Cardiovascular: Positive for palpitations. Negative for chest pain and leg swelling (has not needed lasix in a while).  Gastrointestinal: Negative for abdominal pain.  Neurological: Positive for light-headedness (rare). Negative for headaches.       Objective:   Vitals:   07/20/16 1100 07/20/16 1152  BP: (!) 160/110 140/90  Pulse: 87    Filed Weights   07/20/16 1100  Weight: 127 lb (57.6 kg)   Body mass index is 24.8 kg/m.   Physical Exam    Constitutional: Appears well-developed and well-nourished. No distress.  HENT:  Head: Normocephalic and atraumatic.  Neck: Neck supple. No tracheal deviation present. No thyromegaly present.  No cervical lymphadenopathy Cardiovascular: Normal rate, regular rhythm, split S2.    1/6 systolic murmur heard. No carotid bruit .  No edema Pulmonary/Chest: Effort normal and breath sounds normal. No respiratory distress. No has no wheezes. No rales.  Skin: Skin is warm and dry. Not diaphoretic.  Psychiatric: Normal mood and affect. Behavior is normal.      Assessment & Plan:    See Problem List for Assessment and Plan of chronic medical problems.   F/u in 3 months

## 2016-07-20 NOTE — Assessment & Plan Note (Signed)
Intolerant to statins Will check lipid panel   Will consider izetia per cardiology's recommendations

## 2016-07-20 NOTE — Assessment & Plan Note (Signed)
cmp today Following with nephrology

## 2016-07-20 NOTE — Assessment & Plan Note (Signed)
Slightly elevated here today - she had to rush, was late and had to stop twice walking into the building due to SOB She will start monitoring her BP at home and let me know if it is elevated Continue current medications cmp

## 2016-08-19 ENCOUNTER — Other Ambulatory Visit: Payer: Self-pay

## 2016-08-19 MED ORDER — PIOGLITAZONE HCL 45 MG PO TABS: 45.0000 mg | ORAL_TABLET | Freq: Every day | ORAL | 2 refills | Status: DC

## 2016-08-27 DIAGNOSIS — H43313 Vitreous membranes and strands, bilateral: Secondary | ICD-10-CM | POA: Diagnosis not present

## 2016-08-27 DIAGNOSIS — E119 Type 2 diabetes mellitus without complications: Secondary | ICD-10-CM | POA: Diagnosis not present

## 2016-08-27 LAB — HM DIABETES EYE EXAM

## 2016-08-28 ENCOUNTER — Other Ambulatory Visit: Payer: Self-pay | Admitting: Cardiology

## 2016-08-28 ENCOUNTER — Other Ambulatory Visit: Payer: Self-pay | Admitting: Endocrinology

## 2016-08-31 NOTE — Progress Notes (Signed)
Subjective:    Patient ID: [name], female    DOB: 1928-04-26, 83 y.o.   MRN: [id number]  HPI Pt returns for f/u of diabetes mellitus: DM type: 2 Dx'ed: 0000000 Complications: polyneuropathy, renal insufficiency, TIA, and CAD.   Therapy: 2 oral meds.  GDM: never.   DKA: never Severe hypoglycemia: never.   Pancreatitis: never. Other: she took insulin 2014-2015; oral rx options are limited by renal insufficiency and her request for generic meds.    Interval history: no cbg record, but states cbg's are well-controlled.  She has doe.  Past Medical History:  Diagnosis Date  . Anxiety   . Benign neoplasm of adrenal gland   . CAD (coronary artery disease)    a. nonobstructive disease by cath in 2010. b. NSTEMI 05/2016: cath not performed secondary to Stage 4 CKD  . Calculus of kidney   . Diabetes mellitus    hx noncompliance with rx'd tx  . Diverticulosis of colon (without mention of hemorrhage)   . DJD (degenerative joint disease)   . GERD (gastroesophageal reflux disease)    hx DU with H pylori 01/2009 and GIB, s/p triple rx  . GI bleed 2012   duoedonal ulcer  . H. pylori infection   . Hx of subarachnoid hemorrhage 2001  . Hypercholesterolemia   . Hypertension   . LBBB (left bundle branch block)   . Nonischemic cardiomyopathy ([place])    hypertensive  . Nontoxic multinodular goiter   . Paroxysmal atrial fibrillation (HCC)    not anticoag candidate  . Pulmonary embolism ([place]) 02/2009   on anticoag thru 11/2010 - stopped due to risk>benefit  . Pure hypercholesterolemia   . Tubulovillous adenoma of colon 2012    Past Surgical History:  Procedure Laterality Date  . CATARACT EXTRACTION    . KNEE SURGERY     left  . SHOULDER SURGERY     right  . TONSILLECTOMY      Social History   Social History  . Marital status: Widowed    Spouse name: N/A  . Number of children: 1  . Years of education: N/A   Occupational History  . retired Retired   Social History Main  Topics  . Smoking status: Former Smoker    Packs/day: 0.30    Years: 36.00    Types: Cigarettes    Quit date: 08/18/1983  . Smokeless tobacco: Never Used  . Alcohol use 0.6 oz/week    1 Glasses of wine per week     Comment: occ wine, beer during holidays or at social events  . Drug use: No  . Sexual activity: Not Currently   Other Topics Concern  . Not on file   Social History Narrative   Widow, 1 daughter    Current Outpatient Prescriptions on File Prior to Visit  Medication Sig Dispense Refill  . amLODipine (NORVASC) 10 MG tablet Take 10 mg by mouth at bedtime.    . Ascorbic Acid (VITAMIN C) 1000 MG tablet Take 1,000 mg by mouth daily as needed (FOR IMMUNE).    Marland Kitchen aspirin EC 81 MG tablet Take 1 tablet (81 mg total) by mouth daily.    . calcitRIOL (ROCALTROL) 0.25 MCG capsule Take 0.25 mcg by mouth daily.    . furosemide (LASIX) 20 MG tablet TAKE 1 TABLET (20 MG TOTAL) BY MOUTH DAILY. 30 tablet 1  . hydrALAZINE (APRESOLINE) 10 MG tablet Take 1 tablet (10 mg total) by mouth every 8 (eight) hours. 90 tablet 3  .  isosorbide mononitrate (IMDUR) 30 MG 24 hr tablet Take 1 tablet (30 mg total) by mouth daily. 30 tablet 2  . losartan (COZAAR) 100 MG tablet Take 1 tablet (100 mg total) by mouth daily. RESUME 06/06/16 90 tablet 2  . nitroGLYCERIN (NITROSTAT) 0.4 MG SL tablet Place 1 tablet (0.4 mg total) under the tongue every 5 (five) minutes as needed for chest pain. 50 tablet 0  . repaglinide (PRANDIN) 2 MG tablet TAKE 1 TABLET (2 MG TOTAL) BY MOUTH 3 (THREE) TIMES DAILY BEFORE MEALS. 90 tablet 0   No current facility-administered medications on file prior to visit.     Allergies  Allergen Reactions  . Statins Other (See Comments)    Muscle Aches  . Lisinopril Other (See Comments) and Cough    REACTION: INTOL to ACE's w/ cough  . Penicillins Itching and Rash    Family History  Problem Relation Age of Onset  . Lung cancer Mother   . Brain cancer Mother   . Goiter Mother   .  Cancer Mother   . Heart disease Mother   . Diabetes Mother   . Diabetes Maternal Grandmother   . Heart disease Maternal Aunt     x2  . Colon cancer Neg Hx     BP 132/86   Pulse 91   Ht 5' (1.524 m)   Wt 130 lb (59 kg)   SpO2 92%   BMI 25.39 kg/m   Review of Systems She denies hypoglycemia.      Objective:   Physical Exam VITAL SIGNS:  See vs page GENERAL: no distress Pulses: dorsalis pedis intact bilat.   MSK: no deformity of the feet CV: trace bilat leg edema Skin:  no ulcer on the feet.  normal color and temp on the feet. Neuro: sensation is intact to touch on the feet.   Lab Results  Component Value Date   HGBA1C 6.9 (H) 07/20/2016      Assessment & Plan:  type 2 DM: she has failed oral rx.  She says she cannot afford insulin analogs, so she will need syringe and vial NPH QAM, from [place].  Renal insuff: this limits oral rx options.  CAD: in this setting, she should avoid hypoglycemia.  CHF: she needs to d/c pioglitizone.   Patient is advised the following: Patient Instructions    check your blood sugar once a day.  vary the time of day when you check, between before the 3 meals, and at bedtime.  also check if you have symptoms of your blood sugar being too high or too low.  please keep a record of the readings and bring it to your next appointment here.  You can write it on any piece of paper.  please call us sooner if your blood sugar goes below 70, or if you have a lot of readings over 200.  Please stop taking the pioglitizone, and:  Please continue the same repaglinide.   Please see [name], to learn about drawing insulin from the bottle, and injecting.  Please come back for a follow-up appointment in 2 weeks.

## 2016-09-01 ENCOUNTER — Ambulatory Visit (INDEPENDENT_AMBULATORY_CARE_PROVIDER_SITE_OTHER): Payer: Medicare Other | Admitting: Endocrinology

## 2016-09-01 ENCOUNTER — Encounter: Payer: Self-pay | Admitting: Endocrinology

## 2016-09-01 VITALS — BP 132/86 | HR 91 | Ht 60.0 in | Wt 130.0 lb

## 2016-09-01 DIAGNOSIS — E119 Type 2 diabetes mellitus without complications: Secondary | ICD-10-CM | POA: Diagnosis not present

## 2016-09-01 NOTE — Patient Instructions (Addendum)
check your blood sugar once a day.  vary the time of day when you check, between before the 3 meals, and at bedtime.  also check if you have symptoms of your blood sugar being too high or too low.  please keep a record of the readings and bring it to your next appointment here.  You can write it on any piece of paper.  please call us sooner if your blood sugar goes below 70, or if you have a lot of readings over 200.  Please stop taking the pioglitizone, and:  Please continue the same repaglinide.   Please see [name], to learn about drawing insulin from the bottle, and injecting.  Please come back for a follow-up appointment in 2 weeks.

## 2016-09-08 ENCOUNTER — Other Ambulatory Visit (INDEPENDENT_AMBULATORY_CARE_PROVIDER_SITE_OTHER): Payer: Medicare Other

## 2016-09-08 ENCOUNTER — Ambulatory Visit (INDEPENDENT_AMBULATORY_CARE_PROVIDER_SITE_OTHER): Payer: Medicare Other | Admitting: Internal Medicine

## 2016-09-08 ENCOUNTER — Inpatient Hospital Stay (HOSPITAL_COMMUNITY)
Admission: EM | Admit: 2016-09-08 | Discharge: 2016-09-11 | DRG: 291 | Disposition: A | Discharge status: Home / Self Care | Payer: Medicare Other | Attending: Cardiology | Admitting: Cardiology

## 2016-09-08 ENCOUNTER — Encounter: Payer: Self-pay | Admitting: Internal Medicine

## 2016-09-08 ENCOUNTER — Emergency Department (HOSPITAL_COMMUNITY): Payer: Medicare Other

## 2016-09-08 ENCOUNTER — Telehealth: Payer: Self-pay

## 2016-09-08 ENCOUNTER — Encounter (HOSPITAL_COMMUNITY): Payer: Self-pay | Admitting: Emergency Medicine

## 2016-09-08 VITALS — BP 160/98 | HR 82 | Temp 98.2°F | Resp 18 | Wt 131.0 lb

## 2016-09-08 DIAGNOSIS — Z9849 Cataract extraction status, unspecified eye: Secondary | ICD-10-CM | POA: Diagnosis not present

## 2016-09-08 DIAGNOSIS — I5023 Acute on chronic systolic (congestive) heart failure: Secondary | ICD-10-CM

## 2016-09-08 DIAGNOSIS — I253 Aneurysm of heart: Secondary | ICD-10-CM | POA: Diagnosis not present

## 2016-09-08 DIAGNOSIS — I447 Left bundle-branch block, unspecified: Secondary | ICD-10-CM | POA: Diagnosis present

## 2016-09-08 DIAGNOSIS — I480 Paroxysmal atrial fibrillation: Secondary | ICD-10-CM | POA: Diagnosis present

## 2016-09-08 DIAGNOSIS — Z833 Family history of diabetes mellitus: Secondary | ICD-10-CM

## 2016-09-08 DIAGNOSIS — Z7982 Long term (current) use of aspirin: Secondary | ICD-10-CM

## 2016-09-08 DIAGNOSIS — K219 Gastro-esophageal reflux disease without esophagitis: Secondary | ICD-10-CM | POA: Diagnosis present

## 2016-09-08 DIAGNOSIS — R0602 Shortness of breath: Secondary | ICD-10-CM

## 2016-09-08 DIAGNOSIS — R600 Localized edema: Secondary | ICD-10-CM | POA: Insufficient documentation

## 2016-09-08 DIAGNOSIS — Z79899 Other long term (current) drug therapy: Secondary | ICD-10-CM

## 2016-09-08 DIAGNOSIS — Z86711 Personal history of pulmonary embolism: Secondary | ICD-10-CM

## 2016-09-08 DIAGNOSIS — I5022 Chronic systolic (congestive) heart failure: Secondary | ICD-10-CM | POA: Insufficient documentation

## 2016-09-08 DIAGNOSIS — E1122 Type 2 diabetes mellitus with diabetic chronic kidney disease: Secondary | ICD-10-CM | POA: Diagnosis present

## 2016-09-08 DIAGNOSIS — N184 Chronic kidney disease, stage 4 (severe): Secondary | ICD-10-CM | POA: Diagnosis not present

## 2016-09-08 DIAGNOSIS — I428 Other cardiomyopathies: Secondary | ICD-10-CM | POA: Diagnosis not present

## 2016-09-08 DIAGNOSIS — I255 Ischemic cardiomyopathy: Secondary | ICD-10-CM | POA: Diagnosis not present

## 2016-09-08 DIAGNOSIS — I130 Hypertensive heart and chronic kidney disease with heart failure and stage 1 through stage 4 chronic kidney disease, or unspecified chronic kidney disease: Secondary | ICD-10-CM | POA: Diagnosis not present

## 2016-09-08 DIAGNOSIS — I10 Essential (primary) hypertension: Secondary | ICD-10-CM | POA: Diagnosis present

## 2016-09-08 DIAGNOSIS — I252 Old myocardial infarction: Secondary | ICD-10-CM | POA: Diagnosis not present

## 2016-09-08 DIAGNOSIS — R791 Abnormal coagulation profile: Secondary | ICD-10-CM | POA: Diagnosis present

## 2016-09-08 DIAGNOSIS — I25119 Atherosclerotic heart disease of native coronary artery with unspecified angina pectoris: Secondary | ICD-10-CM | POA: Diagnosis not present

## 2016-09-08 DIAGNOSIS — D649 Anemia, unspecified: Secondary | ICD-10-CM | POA: Diagnosis present

## 2016-09-08 DIAGNOSIS — R0600 Dyspnea, unspecified: Secondary | ICD-10-CM

## 2016-09-08 DIAGNOSIS — Z87891 Personal history of nicotine dependence: Secondary | ICD-10-CM | POA: Diagnosis not present

## 2016-09-08 DIAGNOSIS — E7800 Pure hypercholesterolemia, unspecified: Secondary | ICD-10-CM | POA: Diagnosis not present

## 2016-09-08 DIAGNOSIS — Z880 Allergy status to penicillin: Secondary | ICD-10-CM

## 2016-09-08 DIAGNOSIS — J9811 Atelectasis: Secondary | ICD-10-CM | POA: Diagnosis present

## 2016-09-08 DIAGNOSIS — I248 Other forms of acute ischemic heart disease: Secondary | ICD-10-CM | POA: Diagnosis not present

## 2016-09-08 DIAGNOSIS — Z888 Allergy status to other drugs, medicaments and biological substances status: Secondary | ICD-10-CM

## 2016-09-08 DIAGNOSIS — Z8249 Family history of ischemic heart disease and other diseases of the circulatory system: Secondary | ICD-10-CM | POA: Diagnosis not present

## 2016-09-08 HISTORY — DX: Heart failure, unspecified: I50.9

## 2016-09-08 LAB — BASIC METABOLIC PANEL
ANION GAP: 8 (ref 5–15)
BUN: 41 mg/dL — AB (ref 6–20)
CALCIUM: 8.8 mg/dL — AB (ref 8.9–10.3)
CO2: 24 mmol/L (ref 22–32)
Chloride: 112 mmol/L — ABNORMAL HIGH (ref 101–111)
Creatinine, Ser: 2.57 mg/dL — ABNORMAL HIGH (ref 0.44–1.00)
GFR calc Af Amer: 18 mL/min — ABNORMAL LOW (ref >60)
GFR, EST NON AFRICAN AMERICAN: 16 mL/min — AB (ref >60)
GLUCOSE: 142 mg/dL — AB (ref 65–99)
Potassium: 4.5 mmol/L (ref 3.5–5.1)
SODIUM: 144 mmol/L (ref 135–145)

## 2016-09-08 LAB — CBC
HCT: 32.7 % — ABNORMAL LOW (ref 36.0–46.0)
Hemoglobin: 10.7 g/dL — ABNORMAL LOW (ref 12.0–15.0)
MCH: 28.4 pg (ref 26.0–34.0)
MCHC: 32.7 g/dL (ref 30.0–36.0)
MCV: 86.7 fL (ref 78.0–100.0)
Platelets: 166 10*3/uL (ref 150–400)
RBC: 3.77 MIL/uL — ABNORMAL LOW (ref 3.87–5.11)
RDW: 14.6 % (ref 11.5–15.5)
WBC: 7.1 10*3/uL (ref 4.0–10.5)

## 2016-09-08 LAB — COMPREHENSIVE METABOLIC PANEL
ALT: 31 U/L (ref 0–35)
AST: 29 U/L (ref 0–37)
Albumin: 3.7 g/dL (ref 3.5–5.2)
Alkaline Phosphatase: 49 U/L (ref 39–117)
BUN: 37 mg/dL — ABNORMAL HIGH (ref 6–23)
CALCIUM: 9.2 mg/dL (ref 8.4–10.5)
CHLORIDE: 111 meq/L (ref 96–112)
CO2: 26 meq/L (ref 19–32)
Creatinine, Ser: 2.31 mg/dL — ABNORMAL HIGH (ref 0.40–1.20)
GFR: 25.68 mL/min — AB (ref >60.00)
GLUCOSE: 137 mg/dL — AB (ref 70–99)
POTASSIUM: 4.5 meq/L (ref 3.5–5.1)
Sodium: 142 mEq/L (ref 135–145)
Total Bilirubin: 0.8 mg/dL (ref 0.2–1.2)
Total Protein: 6.6 g/dL (ref 6.0–8.3)

## 2016-09-08 LAB — I-STAT TROPONIN, ED: Troponin i, poc: 0.3 ng/mL (ref 0.00–0.08)

## 2016-09-08 LAB — D-DIMER, QUANTITATIVE: D-Dimer, Quant: 4.51 mcg/mL FEU — ABNORMAL HIGH (ref <0.50)

## 2016-09-08 LAB — BRAIN NATRIURETIC PEPTIDE
B Natriuretic Peptide: 1919.1 pg/mL — ABNORMAL HIGH (ref 0.0–100.0)
Pro B Natriuretic peptide (BNP): 2251 pg/mL — ABNORMAL HIGH (ref 0.0–100.0)

## 2016-09-08 MED ORDER — FUROSEMIDE 10 MG/ML IJ SOLN
40.0000 mg | Freq: Once | INTRAMUSCULAR | Status: AC
  Administered 2016-09-08: 40 mg via INTRAVENOUS
  Filled 2016-09-08: qty 4

## 2016-09-08 MED ORDER — PROTECT UNDERWEAR SUPER SM/MED MISC: 5 refills | Status: DC

## 2016-09-08 NOTE — Progress Notes (Signed)
Pre visit review using our clinic review tool, if applicable. No additional management support is needed unless otherwise documented below in the visit note. 

## 2016-09-08 NOTE — H&P (Signed)
History and Physical   Admit date: 09/08/2016 Name:  [name] Medical record number: [id number] DOB/Age:  January 30, 1928  83 y.o. female  Referring Physician:   Emergency room  Primary Cardiologist:  [place]  Primary Physician:   Quay Burow  Chief complaint/reason for admission: Swelling and shortness of breath  HPI:  This 83 year old black female has a history of a nonischemic cardiomyopathy since at least 2010 with an EF of 30-35%.  She also has diabetes and known hypertension and has a prior history of pulmonary embolus in 2010.  She was treated with Coumadin for a couple of years and then taken off because of risk.  She was recently admitted in October with worsening chest pain and had a non-STEMI.  At that time she was treated medically and her heart failure medicines were increased some.  EF is remained about the same over the years.  She still has exertional chest discomfort but has had a long history of some chest pain which is atypical.  Over a week ago she had some chest discomfort and palpitations were getting ready to go to church but has not had much since then.  About 4 days ago she began to have increasing edema swelling and began to take Lasix but her legs would not go down.  She was unable to get an appointment with cardiology and saw her primary doctor today who found her BNP to be elevated and also a d-dimer was elevated.  She was sent to the emergency room late this afternoon and was seen later this evening by the emergency room physician and deemed to be admitted.  She has mild dyspnea.  She does have edema that has not gone down with furosemide.  She states that she took a pain relief medicines but thinks it was acetaminophen.  Has not been using salt.  There have been no other changes in her medicine.  She doesn't really have any PND or orthopnea.   Past Medical History:  Diagnosis Date  . Anxiety   . Benign neoplasm of adrenal gland   . CAD (coronary artery disease)     a. nonobstructive disease by cath in 2010. b. NSTEMI 05/2016: cath not performed secondary to Stage 4 CKD  . Calculus of kidney   . CHF (congestive heart failure) ([place])   . Diabetes mellitus    hx noncompliance with rx'd tx  . Diverticulosis of colon (without mention of hemorrhage)   . DJD (degenerative joint disease)   . GERD (gastroesophageal reflux disease)    hx DU with H pylori 01/2009 and GIB, s/p triple rx  . GI bleed 2012   duoedonal ulcer  . H. pylori infection   . Hx of subarachnoid hemorrhage 2001  . Hypercholesterolemia   . Hypertension   . LBBB (left bundle branch block)   . Nonischemic cardiomyopathy ([place])    hypertensive  . Nontoxic multinodular goiter   . Paroxysmal atrial fibrillation (HCC)    not anticoag candidate  . Pulmonary embolism ([place]) 02/2009   on anticoag thru 11/2010 - stopped due to risk>benefit  . Pure hypercholesterolemia   . Tubulovillous adenoma of colon 2012    Past Surgical History:  Procedure Laterality Date  . CATARACT EXTRACTION    . KNEE SURGERY     left  . SHOULDER SURGERY     right  . TONSILLECTOMY    . Allergies: is allergic to statins; lisinopril; and penicillins.   Medications: Prior to Admission medications   Medication Sig  Start Date End Date Taking? Authorizing Provider  amLODipine (NORVASC) 10 MG tablet Take 10 mg by mouth at bedtime.   Yes Historical Provider, MD  Ascorbic Acid (VITAMIN C) 1000 MG tablet Take 1,000 mg by mouth daily as needed (FOR IMMUNE).   Yes Historical Provider, MD  aspirin EC 81 MG tablet Take 1 tablet (81 mg total) by mouth daily. 06/15/16  Yes Binnie Rail, MD  calcitRIOL (ROCALTROL) 0.25 MCG capsule Take 0.25 mcg by mouth daily. 05/07/16  Yes Historical Provider, MD  furosemide (LASIX) 20 MG tablet TAKE 1 TABLET (20 MG TOTAL) BY MOUTH DAILY. 08/31/16  Yes Peter M Martinique, MD  hydrALAZINE (APRESOLINE) 10 MG tablet Take 1 tablet (10 mg total) by mouth every 8 (eight) hours. 06/04/16  Yes Venetia Maxon Rama,  MD  isosorbide mononitrate (IMDUR) 30 MG 24 hr tablet Take 1 tablet (30 mg total) by mouth daily. 06/15/16  Yes Binnie Rail, MD  losartan (COZAAR) 100 MG tablet Take 1 tablet (100 mg total) by mouth daily. RESUME 06/06/16 06/04/16  Yes Christina P Rama, MD  nitroGLYCERIN (NITROSTAT) 0.4 MG SL tablet Place 1 tablet (0.4 mg total) under the tongue every 5 (five) minutes as needed for chest pain. 06/15/16  Yes Binnie Rail, MD  repaglinide (PRANDIN) 2 MG tablet TAKE 1 TABLET (2 MG TOTAL) BY MOUTH 3 (THREE) TIMES DAILY BEFORE MEALS. Patient taking differently: TAKE 1 TABLET (2 MG TOTAL) BY MOUTH 2 TIMES DAILY BEFORE MEALS. 08/29/16  Yes Renato Shin, MD   Family History:  Family Status  Relation Status  . Mother Deceased at age 43  . Father Deceased at age 74   cardiovascular hemmorage  . Daughter Alive  . Maternal Grandmother   . Maternal Aunt   . Neg Hx    Social History:   reports that she quit smoking about 33 years ago. Her smoking use included Cigarettes. She has a 10.80 pack-year smoking history. She has never used smokeless tobacco. She reports that she drinks about 0.6 oz of alcohol per week . She reports that she does not use drugs.   Social History   Social History Narrative   Widow, 1 daughter     Review of Systems: Does have diabetes mellitus.  She has occasional headaches.  She has had some abdominal pain that she describes as a sharp pain.  No recent urinary infections.  She complains of cramping of her thighs and her lower extremities.  Not much chest pain in the past few days.  Some diminished hearing. Other than as noted above, the remainder of the review of systems is normal  Physical Exam: BP 140/87   Pulse 90   Temp 98.1 F (36.7 C) (Oral)   Resp 24   SpO2 97%  General appearance: She is a pleasant alert black female appearing younger than stated age and in no acute distress, alert and oriented 3 Head: Normocephalic, without obvious abnormality,  atraumatic Eyes: conjunctivae/corneas clear. PERRL, EOM's intact. Fundi benign. Neck: no adenopathy, no carotid bruit, supple, symmetrical, trachea midline and Neck veins mildly elevated at 30 Lungs: Faint crackles at the bases bilaterally Heart: regular rate and rhythm, S1, S2 normal, no murmur, click, rub or gallop Abdomen: soft, non-tender; bowel sounds normal; no masses,  no organomegaly Pelvic: deferred Extremities: Atraumatic, no deformity, 2+ peripheral edema Pulses: 2+ and symmetric Skin: Skin color, texture, turgor normal. No rashes or lesions Lymph nodes: Cervical, supraclavicular, and axillary nodes normal. Neurologic: Grossly normal  Labs:  CBC  Recent Labs  09/08/16 1946  WBC 7.1  RBC 3.77*  HGB 10.7*  HCT 32.7*  PLT 166  MCV 86.7  MCH 28.4  MCHC 32.7  RDW 14.6   CMP   Recent Labs  09/08/16 1417 09/08/16 1946  NA 142 144  K 4.5 4.5  CL 111 112*  CO2 26 24  GLUCOSE 137* 142*  BUN 37* 41*  CREATININE 2.31* 2.57*  CALCIUM 9.2 8.8*  PROT 6.6  --   ALBUMIN 3.7  --   AST 29  --   ALT 31  --   ALKPHOS 49  --   BILITOT 0.8  --   GFRNONAA  --  16*  GFRAA  --  18*    BNP (last 3 results)  Recent Labs  05/31/16 1215 09/08/16 1946  BNP 462.4* 1,919.1*    ProBNP (last 3 results)  Recent Labs  09/08/16 1417  PROBNP 2,251.0*    Cardiac Panel (last 3 results) Troponin (Point of Care Test)  Recent Labs  09/08/16 1958  TROPIPOC 0.30*   EKG: Sinus rhythm with LBBB Independently reviewed by me  Radiology: Cardiomegaly with effusions small,    IMPRESSIONS: 1.  Acute on chronic systolic congestive heart failure 2.  History of nonobstructive coronary artery disease but clinical symptoms suggestive of angina 3.  Stage IV chronic kidney disease 4.  Anemia 5.  History of pulmonary embolus in 2010 6.  Left bundle branch block  PLAN: Her BNP is elevated.  Will treat with intravenous furosemide and continue her medicines for congestive  heart failure.  Will need to have hydralazine increased if blood pressure allows.   Could not find anything in her recent medicines to find an exacerbation for her heart failure.  There is a possibility that she could've had an ischemic event and she will have a repeat echocardiogram done this admission.  Might be worth changing to [facility] with worsening CHF.   She does have a history of pulmonary embolus.  Her creatinine is up.  I'll get a ventilation perfusion scan in the morning as well as a set of lower extremity Doppler's..  Signed: Kerry Hough MD [facility]  09/08/2016, 11:55 PM

## 2016-09-08 NOTE — Assessment & Plan Note (Signed)
Having intermittent chest heaviness/tightness - likely angina ? Recent MI causing CHF or unstable MI Sees cardiology Friday ekg today Check cxr, labs

## 2016-09-08 NOTE — ED Notes (Signed)
Patient transported to X-ray 

## 2016-09-08 NOTE — Assessment & Plan Note (Signed)
Increased edema - likely related to CHF Check labs as indication Will likely need increased lasix

## 2016-09-08 NOTE — ED Notes (Signed)
Pt to xray, cleared with primary RN

## 2016-09-08 NOTE — Assessment & Plan Note (Signed)
Present for 4 days, associated with chest pain Concern for MI in setting of CAD with angina, CHF, history of PE  - less likely PE Check CXR, BNP, CMP, d-dimer EKG today Management based on results

## 2016-09-08 NOTE — Telephone Encounter (Signed)
noted 

## 2016-09-08 NOTE — Patient Instructions (Signed)
You had an EKG today.  Have blood work and a chest xray today.  We will call you with the results and with instructions.    If your symptoms get worse go to the ED.

## 2016-09-08 NOTE — ED Notes (Signed)
Pt returned to room and placed back on moniotor

## 2016-09-08 NOTE — Progress Notes (Signed)
Subjective:    Patient ID: [name], female    DOB: 05-Aug-1928, 83 y.o.   MRN: [id number]  HPI She is here for an acute visit.   SOB:  She started feeling SOB 4 days ago.  It started when she was in bed.  She was not able to take a deep breath and get air into her chest.  When she sat up she could breath better.  She felt SOB throughout the day and it is worse when she lays down.  She denies chest pain, but had heaviness in her chest intermittently over the past 4 days. Two weeks ago she had left sided chest pain.  She used her NTG x 3 and it relieved the chest  pain.     Today she states her SOB has improved, but she is not at her baseline.  Her daughter feels she is still sluggish and still SOB. She no longer feels the heaviness in her chest.   Her feet are more swollen - this started 4 days ago as well. .  She is taking the lasix daily - usually it controls the swelling.     BP at home with wrist monitor - 134/77.  She weighs herself and her weight is stable, except for going up over the holidays.    She is taking all of her medications as prescribed.  She is compliant with a low sodium diet.  She denies changes in her lifestyle, diet or medication that may have caused her symptoms.    She has a history of a pulmonary embolism and some of her symptoms remind her of that.  She is taking her aspirin daily.   Medications and allergies reviewed with patient and updated if appropriate.  Patient Active Problem List   Diagnosis Date Noted  . Symptomatic bradycardia 05/31/2016  . Thrombocytopenia ([place]) 05/31/2016  . Neuropathy ([place]) 04/30/2016  . Diabetes mellitus type 2 in nonobese ([place]) 12/25/2015  . PAROXYSMAL ATRIAL FIBRILLATION 09/30/2009  . CKD (chronic kidney disease), stage IV ([place]) 08/05/2009  . PULMONARY EMBOLISM 07/03/2009  . GOITER, MULTINODULAR 05/11/2009  . PEPTIC ULCER DISEASE, HELICOBACTER PYLORI POSITIVE 03/11/2009  . Coronary atherosclerosis 11/29/2008  .  Hypertensive cardiomyopathy ([place]) 11/12/2008  . BENIGN NEOPLASM OF ADRENAL GLAND 06/20/2008  . DIVERTICULOSIS OF COLON 06/20/2008  . ARTERIOVENOUS MALFORMATION 06/20/2008  . HYPERCHOLESTEROLEMIA 05/10/2008  . Essential hypertension 05/10/2008  . TRANSIENT ISCHEMIC ATTACK 05/10/2008  . NEPHROLITHIASIS 05/10/2008    Current Outpatient Prescriptions on File Prior to Visit  Medication Sig Dispense Refill  . amLODipine (NORVASC) 10 MG tablet Take 10 mg by mouth at bedtime.    . Ascorbic Acid (VITAMIN C) 1000 MG tablet Take 1,000 mg by mouth daily as needed (FOR IMMUNE).    Marland Kitchen aspirin EC 81 MG tablet Take 1 tablet (81 mg total) by mouth daily.    . calcitRIOL (ROCALTROL) 0.25 MCG capsule Take 0.25 mcg by mouth daily.    . furosemide (LASIX) 20 MG tablet TAKE 1 TABLET (20 MG TOTAL) BY MOUTH DAILY. 30 tablet 1  . hydrALAZINE (APRESOLINE) 10 MG tablet Take 1 tablet (10 mg total) by mouth every 8 (eight) hours. 90 tablet 3  . isosorbide mononitrate (IMDUR) 30 MG 24 hr tablet Take 1 tablet (30 mg total) by mouth daily. 30 tablet 2  . losartan (COZAAR) 100 MG tablet Take 1 tablet (100 mg total) by mouth daily. RESUME 06/06/16 90 tablet 2  . nitroGLYCERIN (NITROSTAT) 0.4 MG SL tablet  Place 1 tablet (0.4 mg total) under the tongue every 5 (five) minutes as needed for chest pain. 50 tablet 0  . repaglinide (PRANDIN) 2 MG tablet TAKE 1 TABLET (2 MG TOTAL) BY MOUTH 3 (THREE) TIMES DAILY BEFORE MEALS. 90 tablet 0   No current facility-administered medications on file prior to visit.     Past Medical History:  Diagnosis Date  . Anxiety   . Benign neoplasm of adrenal gland   . CAD (coronary artery disease)    a. nonobstructive disease by cath in 2010. b. NSTEMI 05/2016: cath not performed secondary to Stage 4 CKD  . Calculus of kidney   . Diabetes mellitus    hx noncompliance with rx'd tx  . Diverticulosis of colon (without mention of hemorrhage)   . DJD (degenerative joint disease)   . GERD  (gastroesophageal reflux disease)    hx DU with H pylori 01/2009 and GIB, s/p triple rx  . GI bleed 2012   duoedonal ulcer  . H. pylori infection   . Hx of subarachnoid hemorrhage 2001  . Hypercholesterolemia   . Hypertension   . LBBB (left bundle branch block)   . Nonischemic cardiomyopathy ([place])    hypertensive  . Nontoxic multinodular goiter   . Paroxysmal atrial fibrillation (HCC)    not anticoag candidate  . Pulmonary embolism ([place]) 02/2009   on anticoag thru 11/2010 - stopped due to risk>benefit  . Pure hypercholesterolemia   . Tubulovillous adenoma of colon 2012    Past Surgical History:  Procedure Laterality Date  . CATARACT EXTRACTION    . KNEE SURGERY     left  . SHOULDER SURGERY     right  . TONSILLECTOMY      Social History   Social History  . Marital status: Widowed    Spouse name: N/A  . Number of children: 1  . Years of education: N/A   Occupational History  . retired Retired   Social History Main Topics  . Smoking status: Former Smoker    Packs/day: 0.30    Years: 36.00    Types: Cigarettes    Quit date: 08/18/1983  . Smokeless tobacco: Never Used  . Alcohol use 0.6 oz/week    1 Glasses of wine per week     Comment: occ wine, beer during holidays or at social events  . Drug use: No  . Sexual activity: Not Currently   Other Topics Concern  . Not on file   Social History Narrative   Widow, 1 daughter    Family History  Problem Relation Age of Onset  . Lung cancer Mother   . Brain cancer Mother   . Goiter Mother   . Cancer Mother   . Heart disease Mother   . Diabetes Mother   . Diabetes Maternal Grandmother   . Heart disease Maternal Aunt     x2  . Colon cancer Neg Hx     Review of Systems  Constitutional: Negative for appetite change and fever.  Respiratory: Positive for cough (dry ) and shortness of breath. Negative for wheezing.   Cardiovascular: Positive for chest pain (last episode two weeks ago - none in last few days),  palpitations (two weeks ago with chest pain, none recently) and leg swelling.  Gastrointestinal: Negative for abdominal pain and nausea.  Neurological: Negative for light-headedness and headaches.       Objective:   Vitals:   09/08/16 1319  BP: (!) 160/98  Pulse: 82  Resp: 18  Temp: 98.2 F (36.8 C)   Filed Weights   09/08/16 1319  Weight: 131 lb (59.4 kg)   Body mass index is 25.58 kg/m.  Wt Readings from Last 3 Encounters:  09/08/16 131 lb (59.4 kg)  09/01/16 130 lb (59 kg)  07/20/16 127 lb (57.6 kg)     Physical Exam  Constitutional: She appears well-developed and well-nourished. No distress (mild shortness of breath with exertion ).  HENT:  Head: Normocephalic and atraumatic.  Eyes: Conjunctivae are normal.  Neck: Neck supple. JVD present. No tracheal deviation present. No thyromegaly present.  Cardiovascular: Regular rhythm and normal heart sounds.   tachycardic  Pulmonary/Chest: She is in respiratory distress (minimal SOB with exertion - getting on the table). She has no wheezes. She has no rales.  Abdominal: Soft. She exhibits no distension. There is no tenderness. There is no rebound.  Musculoskeletal: She exhibits edema (2+ pitting edema feet to mid leg bilaterally).  Lymphadenopathy:    She has no cervical adenopathy.  Skin: Skin is warm and dry. She is not diaphoretic.          Assessment & Plan:   EKG shows no significant change from prior, LBBB which is not new CXR ordered today, but patient did not get it done Labs with BNP > 2200 suggestive of acute CHF  See Problem List for Assessment and Plan of chronic medical problems.

## 2016-09-08 NOTE — ED Triage Notes (Signed)
Patient reports SOB with persistent dry cough , chest congestion / exertional dyspnea and chest congestion onset this week , advised by her PCP to go to ER .

## 2016-09-08 NOTE — Assessment & Plan Note (Signed)
Sob, intermittent chest pain, edema for 4 days.  SOB, edema, jvd on exam Likely acute on chronic CHF Check BNP, CMP, CXR Taking lasix 20 mg daily - may be able to increase lasix as outpatient, but may need to be admitted

## 2016-09-08 NOTE — ED Notes (Signed)
ED Provider at bedside. 

## 2016-09-08 NOTE — Telephone Encounter (Signed)
Critical lab with D-Dimer at 4.51. Please advise

## 2016-09-08 NOTE — ED Notes (Addendum)
Lab called with Troponin 0.30.  Dr. Darl Householder made aware.

## 2016-09-09 ENCOUNTER — Inpatient Hospital Stay (HOSPITAL_COMMUNITY): Payer: Medicare Other

## 2016-09-09 ENCOUNTER — Other Ambulatory Visit (HOSPITAL_COMMUNITY): Payer: Medicare Other

## 2016-09-09 ENCOUNTER — Encounter (HOSPITAL_COMMUNITY): Payer: Self-pay | Admitting: *Deleted

## 2016-09-09 DIAGNOSIS — I252 Old myocardial infarction: Secondary | ICD-10-CM | POA: Diagnosis not present

## 2016-09-09 DIAGNOSIS — I248 Other forms of acute ischemic heart disease: Secondary | ICD-10-CM | POA: Diagnosis present

## 2016-09-09 DIAGNOSIS — Z888 Allergy status to other drugs, medicaments and biological substances status: Secondary | ICD-10-CM | POA: Diagnosis not present

## 2016-09-09 DIAGNOSIS — I5023 Acute on chronic systolic (congestive) heart failure: Secondary | ICD-10-CM | POA: Diagnosis not present

## 2016-09-09 DIAGNOSIS — Z8249 Family history of ischemic heart disease and other diseases of the circulatory system: Secondary | ICD-10-CM | POA: Diagnosis not present

## 2016-09-09 DIAGNOSIS — K219 Gastro-esophageal reflux disease without esophagitis: Secondary | ICD-10-CM | POA: Diagnosis present

## 2016-09-09 DIAGNOSIS — J9811 Atelectasis: Secondary | ICD-10-CM | POA: Diagnosis present

## 2016-09-09 DIAGNOSIS — I428 Other cardiomyopathies: Secondary | ICD-10-CM | POA: Diagnosis present

## 2016-09-09 DIAGNOSIS — N184 Chronic kidney disease, stage 4 (severe): Secondary | ICD-10-CM | POA: Diagnosis present

## 2016-09-09 DIAGNOSIS — I253 Aneurysm of heart: Secondary | ICD-10-CM | POA: Diagnosis present

## 2016-09-09 DIAGNOSIS — I25119 Atherosclerotic heart disease of native coronary artery with unspecified angina pectoris: Secondary | ICD-10-CM | POA: Diagnosis present

## 2016-09-09 DIAGNOSIS — Z833 Family history of diabetes mellitus: Secondary | ICD-10-CM | POA: Diagnosis not present

## 2016-09-09 DIAGNOSIS — R0600 Dyspnea, unspecified: Secondary | ICD-10-CM

## 2016-09-09 DIAGNOSIS — I480 Paroxysmal atrial fibrillation: Secondary | ICD-10-CM | POA: Diagnosis present

## 2016-09-09 DIAGNOSIS — Z9849 Cataract extraction status, unspecified eye: Secondary | ICD-10-CM | POA: Diagnosis not present

## 2016-09-09 DIAGNOSIS — I130 Hypertensive heart and chronic kidney disease with heart failure and stage 1 through stage 4 chronic kidney disease, or unspecified chronic kidney disease: Secondary | ICD-10-CM | POA: Diagnosis present

## 2016-09-09 DIAGNOSIS — I447 Left bundle-branch block, unspecified: Secondary | ICD-10-CM | POA: Diagnosis present

## 2016-09-09 DIAGNOSIS — R0602 Shortness of breath: Secondary | ICD-10-CM | POA: Diagnosis not present

## 2016-09-09 DIAGNOSIS — D649 Anemia, unspecified: Secondary | ICD-10-CM | POA: Diagnosis present

## 2016-09-09 DIAGNOSIS — I255 Ischemic cardiomyopathy: Secondary | ICD-10-CM | POA: Diagnosis not present

## 2016-09-09 DIAGNOSIS — Z7982 Long term (current) use of aspirin: Secondary | ICD-10-CM | POA: Diagnosis not present

## 2016-09-09 DIAGNOSIS — E1122 Type 2 diabetes mellitus with diabetic chronic kidney disease: Secondary | ICD-10-CM | POA: Diagnosis present

## 2016-09-09 DIAGNOSIS — E7800 Pure hypercholesterolemia, unspecified: Secondary | ICD-10-CM | POA: Diagnosis present

## 2016-09-09 DIAGNOSIS — Z87891 Personal history of nicotine dependence: Secondary | ICD-10-CM | POA: Diagnosis not present

## 2016-09-09 DIAGNOSIS — Z880 Allergy status to penicillin: Secondary | ICD-10-CM | POA: Diagnosis not present

## 2016-09-09 DIAGNOSIS — Z86711 Personal history of pulmonary embolism: Secondary | ICD-10-CM | POA: Diagnosis not present

## 2016-09-09 DIAGNOSIS — Z79899 Other long term (current) drug therapy: Secondary | ICD-10-CM | POA: Diagnosis not present

## 2016-09-09 LAB — ECHOCARDIOGRAM COMPLETE: WEIGHTICAEL: 2096 [oz_av]

## 2016-09-09 LAB — GLUCOSE, CAPILLARY
Glucose-Capillary: 147 mg/dL — ABNORMAL HIGH (ref 65–99)
Glucose-Capillary: 72 mg/dL (ref 65–99)

## 2016-09-09 LAB — CBG MONITORING, ED
GLUCOSE-CAPILLARY: 155 mg/dL — AB (ref 65–99)
Glucose-Capillary: 177 mg/dL — ABNORMAL HIGH (ref 65–99)
Glucose-Capillary: 92 mg/dL (ref 65–99)
Glucose-Capillary: 88 mg/dL (ref 65–99)

## 2016-09-09 LAB — CBC
HCT: 33.1 % — ABNORMAL LOW (ref 36.0–46.0)
Hemoglobin: 10.9 g/dL — ABNORMAL LOW (ref 12.0–15.0)
MCH: 28.2 pg (ref 26.0–34.0)
MCHC: 32.9 g/dL (ref 30.0–36.0)
MCV: 85.8 fL (ref 78.0–100.0)
PLATELETS: 174 10*3/uL (ref 150–400)
RBC: 3.86 MIL/uL — ABNORMAL LOW (ref 3.87–5.11)
RDW: 14.5 % (ref 11.5–15.5)
WBC: 6.4 10*3/uL (ref 4.0–10.5)

## 2016-09-09 LAB — CREATININE, SERUM
CREATININE: 2.51 mg/dL — AB (ref 0.44–1.00)
GFR calc Af Amer: 19 mL/min — ABNORMAL LOW (ref >60)
GFR calc non Af Amer: 16 mL/min — ABNORMAL LOW (ref >60)

## 2016-09-09 MED ORDER — SODIUM CHLORIDE 0.9% FLUSH
3.0000 mL | Freq: Two times a day (BID) | INTRAVENOUS | Status: DC
  Administered 2016-09-09 – 2016-09-11 (×5): 3 mL via INTRAVENOUS

## 2016-09-09 MED ORDER — LOSARTAN POTASSIUM 50 MG PO TABS
100.0000 mg | ORAL_TABLET | Freq: Every day | ORAL | Status: DC
  Administered 2016-09-10: 100 mg via ORAL
  Filled 2016-09-09 (×2): qty 2

## 2016-09-09 MED ORDER — CALCITRIOL 0.25 MCG PO CAPS
0.2500 ug | ORAL_CAPSULE | Freq: Every day | ORAL | Status: DC
  Administered 2016-09-10 – 2016-09-11 (×2): 0.25 ug via ORAL
  Filled 2016-09-09 (×3): qty 1

## 2016-09-09 MED ORDER — AMLODIPINE BESYLATE 10 MG PO TABS
10.0000 mg | ORAL_TABLET | Freq: Every day | ORAL | Status: DC
  Administered 2016-09-09 – 2016-09-10 (×3): 10 mg via ORAL
  Filled 2016-09-09 (×2): qty 1
  Filled 2016-09-09: qty 2

## 2016-09-09 MED ORDER — ISOSORBIDE MONONITRATE ER 30 MG PO TB24
30.0000 mg | ORAL_TABLET | Freq: Every day | ORAL | Status: DC
  Administered 2016-09-09 – 2016-09-11 (×3): 30 mg via ORAL
  Filled 2016-09-09 (×3): qty 1

## 2016-09-09 MED ORDER — TECHNETIUM TO 99M ALBUMIN AGGREGATED
4.1000 | Freq: Once | INTRAVENOUS | Status: AC | PRN
  Administered 2016-09-09: 4.1 via INTRAVENOUS

## 2016-09-09 MED ORDER — REPAGLINIDE 0.5 MG PO TABS
1.0000 mg | ORAL_TABLET | Freq: Three times a day (TID) | ORAL | Status: DC
  Administered 2016-09-09 – 2016-09-11 (×7): 1 mg via ORAL
  Filled 2016-09-09 (×3): qty 1
  Filled 2016-09-09: qty 2
  Filled 2016-09-09 (×2): qty 1
  Filled 2016-09-09: qty 2
  Filled 2016-09-09 (×8): qty 1

## 2016-09-09 MED ORDER — NITROGLYCERIN 0.4 MG SL SUBL: 0.4000 mg | SUBLINGUAL_TABLET | SUBLINGUAL | Status: DC | PRN

## 2016-09-09 MED ORDER — ENOXAPARIN SODIUM 30 MG/0.3ML ~~LOC~~ SOLN
30.0000 mg | Freq: Every day | SUBCUTANEOUS | Status: DC
  Administered 2016-09-10: 30 mg via SUBCUTANEOUS
  Filled 2016-09-09 (×3): qty 0.3

## 2016-09-09 MED ORDER — HYDRALAZINE HCL 25 MG PO TABS
25.0000 mg | ORAL_TABLET | Freq: Four times a day (QID) | ORAL | Status: DC
  Administered 2016-09-09 – 2016-09-11 (×6): 25 mg via ORAL
  Filled 2016-09-09 (×7): qty 1

## 2016-09-09 MED ORDER — ACETAMINOPHEN 325 MG PO TABS: 650.0000 mg | ORAL_TABLET | ORAL | Status: DC | PRN

## 2016-09-09 MED ORDER — ONDANSETRON HCL 4 MG/2ML IJ SOLN: 4.0000 mg | Freq: Four times a day (QID) | INTRAMUSCULAR | Status: DC | PRN

## 2016-09-09 MED ORDER — SODIUM CHLORIDE 0.9 % IV SOLN: 250.0000 mL | INTRAVENOUS | Status: DC | PRN

## 2016-09-09 MED ORDER — FUROSEMIDE 10 MG/ML IJ SOLN
40.0000 mg | Freq: Two times a day (BID) | INTRAMUSCULAR | Status: DC
  Administered 2016-09-09 – 2016-09-11 (×4): 40 mg via INTRAVENOUS
  Filled 2016-09-09 (×4): qty 4

## 2016-09-09 MED ORDER — TECHNETIUM TC 99M DIETHYLENETRIAME-PENTAACETIC ACID: 31.5000 | Freq: Once | INTRAVENOUS | Status: DC | PRN

## 2016-09-09 MED ORDER — INSULIN ASPART 100 UNIT/ML ~~LOC~~ SOLN
0.0000 [IU] | Freq: Three times a day (TID) | SUBCUTANEOUS | Status: DC
  Administered 2016-09-09 – 2016-09-10 (×3): 1 [IU] via SUBCUTANEOUS
  Administered 2016-09-11: 2 [IU] via SUBCUTANEOUS

## 2016-09-09 MED ORDER — ASPIRIN EC 81 MG PO TBEC
81.0000 mg | DELAYED_RELEASE_TABLET | Freq: Every day | ORAL | Status: DC
  Administered 2016-09-09 – 2016-09-11 (×3): 81 mg via ORAL
  Filled 2016-09-09 (×3): qty 1

## 2016-09-09 MED ORDER — SODIUM CHLORIDE 0.9% FLUSH: 3.0000 mL | INTRAVENOUS | Status: DC | PRN

## 2016-09-09 NOTE — Progress Notes (Signed)
Progress Note  Patient Name: [name] Date of Encounter: 09/09/2016  Primary Cardiologist: Dr. Percival Spanish  Subjective   Breathing is much improved. Still with LE edema  Inpatient Medications    Scheduled Meds: . amLODipine  10 mg Oral QHS  . aspirin EC  81 mg Oral Daily  . calcitRIOL  0.25 mcg Oral Daily  . enoxaparin (LOVENOX) injection  30 mg Subcutaneous Daily  . furosemide  40 mg Intravenous Q12H  . hydrALAZINE  25 mg Oral QID  . insulin aspart  0-9 Units Subcutaneous TID WC  . isosorbide mononitrate  30 mg Oral Daily  . losartan  100 mg Oral Daily  . repaglinide  1 mg Oral TID AC  . sodium chloride flush  3 mL Intravenous Q12H   Continuous Infusions: . sodium chloride     PRN Meds: sodium chloride, acetaminophen, nitroGLYCERIN, ondansetron (ZOFRAN) IV, sodium chloride flush, technetium TC 84M diethylenetriame-pentaacetic acid   Vital Signs    Vitals:   09/09/16 0604 09/09/16 0630 09/09/16 0700 09/09/16 0800  BP:  122/71 120/76 143/90  Pulse:  73 75 79  Resp:  17 23 15   Temp:      TempSrc:      SpO2: 100% 98% 100% 100%    Intake/Output Summary (Last 24 hours) at 09/09/16 1114 Last data filed at 09/09/16 0805  Gross per 24 hour  Intake                0 ml  Output              760 ml  Net             -760 ml   There were no vitals filed for this visit.  Telemetry    SR with short runs of NSVT - Personally Reviewed  ECG    SR with LBBB - Personally Reviewed  Physical Exam   GEN: No acute distress.  Neck: No JVD Cardiac: RRR, no murmurs, rubs, or gallops.  Respiratory: Clear to auscultation bilaterally. GI: Soft, nontender, non-distended  MS: No edema; No deformity. 1+ pitting edema bilaterally.  Neuro:  AAOx3. Psych: Normal affect  Labs    Chemistry Recent Labs Lab 09/08/16 1417 09/08/16 1946 09/09/16 0130  NA 142 144  --   K 4.5 4.5  --   CL 111 112*  --   CO2 26 24  --   GLUCOSE 137* 142*  --   BUN 37* 41*  --     CREATININE 2.31* 2.57* 2.51*  CALCIUM 9.2 8.8*  --   PROT 6.6  --   --   ALBUMIN 3.7  --   --   AST 29  --   --   ALT 31  --   --   ALKPHOS 49  --   --   BILITOT 0.8  --   --   GFRNONAA  --  16* 16*  GFRAA  --  18* 19*  ANIONGAP  --  8  --      Hematology Recent Labs Lab 09/08/16 1946 09/09/16 0130  WBC 7.1 6.4  RBC 3.77* 3.86*  HGB 10.7* 10.9*  HCT 32.7* 33.1*  MCV 86.7 85.8  MCH 28.4 28.2  MCHC 32.7 32.9  RDW 14.6 14.5  PLT 166 174    Cardiac EnzymesNo results for input(s): TROPONINI in the last 168 hours.  Recent Labs Lab 09/08/16 1958  TROPIPOC 0.30*     BNP Recent Labs Lab 09/08/16  1417 09/08/16 1946  BNP  --  1,919.1*  PROBNP 2,251.0*  --      DDimer  Recent Labs Lab 09/08/16 1417  DDIMER 4.51*     Radiology    Dg Chest 2 View  Result Date: 09/08/2016 CLINICAL DATA:  Chest tightness with shortness of breath and CHF for 4 days. EXAM: CHEST  2 VIEW COMPARISON:  06/01/2016 FINDINGS: The cardio pericardial silhouette is enlarged. Bibasilar atelectasis noted with tiny bilateral pleural effusions. No evidence for pulmonary edema. The visualized bony structures of the thorax are intact. IMPRESSION: Cardiomegaly with basilar atelectasis and tiny effusions. Electronically Signed   By: Misty Stanley M.D.   On: 09/08/2016 21:30   Nm Pulmonary Perf And Vent  Result Date: 09/09/2016 CLINICAL DATA:  Elevated D-dimer, shortness of Breath EXAM: NUCLEAR MEDICINE VENTILATION - PERFUSION LUNG SCAN TECHNIQUE: Ventilation images were obtained in multiple projections using inhaled aerosol Tc-26m DTPA. Perfusion images were obtained in multiple projections after intravenous injection of Tc-73m MAA. RADIOPHARMACEUTICALS:  31.5 mCi Technetium-106m DTPA aerosol inhalation and 4.1 mCi Technetium-76m MAA IV COMPARISON:  Chest x-ray yesterday FINDINGS: Ventilation: No focal ventilation defect. Perfusion: No wedge shaped peripheral perfusion defects to suggest acute pulmonary  embolism. IMPRESSION: No evidence of pulmonary embolus. Electronically Signed   By: Rolm Baptise M.D.   On: 09/09/2016 09:04    Cardiac Studies   N/A  Patient Profile     83 y.o. female with PMH of NICM, HTN, PE who presented to the ED after being at her PCP office for edema and found to have an elevated BNP.   Assessment & Plan    1.  Acute on chronic systolic congestive heart failure: BNP elevated on admission. Started on IV lasix with improvement. Weight is up from her dry weight 125lb>>131lb. Breathing has improved. Still have 1+ pitting edema.  2.  Hx of NICM: POC trop 0.3, No chest pain this morning.  -- Echo pending  3.  Stage IV chronic kidney disease: Cr stable with IV diuresis. 4.  Anemia  5.  History of pulmonary embolus in 2010: VQ scan was negative for PE. Reports being on coumadin in the past but has been many years.   6.  Left bundle branch block  Signed, Reino Bellis, NP  09/09/2016, 11:14 AM    Personally seen and examined. Agree with above. Continue IV lasix Lungs improved No PE Demand ischemia Trop 0.3 in setting of HF.  ECHO - Left ventricle: The cavity size was moderately dilated. Wall   thickness was normal. Systolic function was moderately to   severely reduced. The estimated ejection fraction was in the   range of 30% to 35%. Diffuse hypokinesis. Left ventricular   diastolic function parameters were normal. - Mitral valve: There was mild regurgitation. - Left atrium: The atrium was mildly dilated. - Atrial septum: Atrial septum thinned and bowed to right cannot   r/o PFO.  Candee Furbish, MD

## 2016-09-09 NOTE — ED Notes (Signed)
Patient transported to Nuclear Med 

## 2016-09-09 NOTE — Progress Notes (Signed)
**  Preliminary report by tech**  Bilateral lower extremity venous duplex completed. There is no evidence of deep or superficial vein thrombosis involving the right and left lower extremities. All visualized vessels appear patent and compressible. There is no evidence of Baker's cysts bilaterally.  09/09/16 12:42 PM [name] RVT

## 2016-09-09 NOTE — ED Provider Notes (Signed)
[facility] DEPT Provider Note   CSN: [id number] Arrival date & time: 09/08/16  1929     History   Chief Complaint Chief Complaint  Patient presents with  . Shortness of Breath    CHF    HPI [name] is a 83 y.o. female.  HPI Patient presents to the emergency department with a four-day history of shortness of breath and lower extremity swelling.  The patient states that she was seen by her primary care doctor who advised him to the emergency department due to the fact that she will she may be in congestive heart failure.  The patient states that nothing seemed to make the condition better or worse.  The patient denies chest pain, , headache,blurred vision, neck pain, fever, cough, weakness, numbness, dizziness, anorexia, edema, abdominal pain, nausea, vomiting, diarrhea, rash, back pain, dysuria, hematemesis, bloody stool, near syncope, or syncope. Past Medical History:  Diagnosis Date  . Anxiety   . Benign neoplasm of adrenal gland   . CAD (coronary artery disease)    a. nonobstructive disease by cath in 2010. b. NSTEMI 05/2016: cath not performed secondary to Stage 4 CKD  . Calculus of kidney   . CHF (congestive heart failure) ([place])   . Diabetes mellitus    hx noncompliance with rx'd tx  . Diverticulosis of colon (without mention of hemorrhage)   . DJD (degenerative joint disease)   . GERD (gastroesophageal reflux disease)    hx DU with H pylori 01/2009 and GIB, s/p triple rx  . GI bleed 2012   duoedonal ulcer  . H. pylori infection   . Hx of subarachnoid hemorrhage 2001  . Hypercholesterolemia   . Hypertension   . LBBB (left bundle branch block)   . Nonischemic cardiomyopathy ([place])    hypertensive  . Nontoxic multinodular goiter   . Paroxysmal atrial fibrillation ([facility])    not anticoag candidate  . Pulmonary embolism ([place]) 02/2009   on anticoag thru 11/2010 - stopped due to risk>benefit  . Pure hypercholesterolemia   . Tubulovillous adenoma of colon 2012      Patient Active Problem List   Diagnosis Date Noted  . SOB (shortness of breath) 09/08/2016  . Bilateral leg edema 09/08/2016  . Acute on chronic systolic congestive heart failure ([place]) 09/08/2016  . Symptomatic bradycardia 05/31/2016  . Thrombocytopenia ([place]) 05/31/2016  . Neuropathy ([place]) 04/30/2016  . Diabetes mellitus type 2 in nonobese ([place]) 12/25/2015  . PAROXYSMAL ATRIAL FIBRILLATION 09/30/2009  . CKD (chronic kidney disease), stage IV ([place]) 08/05/2009  . PULMONARY EMBOLISM 07/03/2009  . GOITER, MULTINODULAR 05/11/2009  . PEPTIC ULCER DISEASE, HELICOBACTER PYLORI POSITIVE 03/11/2009  . Coronary atherosclerosis 11/29/2008  . Hypertensive cardiomyopathy ([place]) 11/12/2008  . BENIGN NEOPLASM OF ADRENAL GLAND 06/20/2008  . DIVERTICULOSIS OF COLON 06/20/2008  . ARTERIOVENOUS MALFORMATION 06/20/2008  . HYPERCHOLESTEROLEMIA 05/10/2008  . Essential hypertension 05/10/2008  . TRANSIENT ISCHEMIC ATTACK 05/10/2008  . NEPHROLITHIASIS 05/10/2008    Past Surgical History:  Procedure Laterality Date  . CATARACT EXTRACTION    . KNEE SURGERY     left  . SHOULDER SURGERY     right  . TONSILLECTOMY      OB History    No data available       Home Medications    Prior to Admission medications   Medication Sig Start Date End Date Taking? Authorizing Provider  amLODipine (NORVASC) 10 MG tablet Take 10 mg by mouth at bedtime.   Yes Historical Provider, MD  Ascorbic Acid (  VITAMIN C) 1000 MG tablet Take 1,000 mg by mouth daily as needed (FOR IMMUNE).   Yes Historical Provider, MD  aspirin EC 81 MG tablet Take 1 tablet (81 mg total) by mouth daily. 06/15/16  Yes Binnie Rail, MD  calcitRIOL (ROCALTROL) 0.25 MCG capsule Take 0.25 mcg by mouth daily. 05/07/16  Yes Historical Provider, MD  furosemide (LASIX) 20 MG tablet TAKE 1 TABLET (20 MG TOTAL) BY MOUTH DAILY. 08/31/16  Yes Peter M Martinique, MD  hydrALAZINE (APRESOLINE) 10 MG tablet Take 1 tablet (10 mg total) by mouth every 8 (eight)  hours. 06/04/16  Yes Venetia Maxon Rama, MD  isosorbide mononitrate (IMDUR) 30 MG 24 hr tablet Take 1 tablet (30 mg total) by mouth daily. 06/15/16  Yes Binnie Rail, MD  losartan (COZAAR) 100 MG tablet Take 1 tablet (100 mg total) by mouth daily. RESUME 06/06/16 06/04/16  Yes Christina P Rama, MD  nitroGLYCERIN (NITROSTAT) 0.4 MG SL tablet Place 1 tablet (0.4 mg total) under the tongue every 5 (five) minutes as needed for chest pain. 06/15/16  Yes Binnie Rail, MD  repaglinide (PRANDIN) 2 MG tablet TAKE 1 TABLET (2 MG TOTAL) BY MOUTH 3 (THREE) TIMES DAILY BEFORE MEALS. Patient taking differently: TAKE 1 TABLET (2 MG TOTAL) BY MOUTH 2 TIMES DAILY BEFORE MEALS. 08/29/16  Yes Renato Shin, MD    Family History Family History  Problem Relation Age of Onset  . Lung cancer Mother   . Brain cancer Mother   . Goiter Mother   . Cancer Mother   . Heart disease Mother   . Diabetes Mother   . Diabetes Maternal Grandmother   . Heart disease Maternal Aunt     x2  . Colon cancer Neg Hx     Social History Social History  Substance Use Topics  . Smoking status: Former Smoker    Packs/day: 0.30    Years: 36.00    Types: Cigarettes    Quit date: 08/18/1983  . Smokeless tobacco: Never Used  . Alcohol use 0.6 oz/week    1 Glasses of wine per week     Comment: occ wine, beer during holidays or at social events     Allergies   Statins; Lisinopril; and Penicillins   Review of Systems Review of Systems  All other systems negative except as documented in the HPI. All pertinent positives and negatives as reviewed in the HPI. Physical Exam Updated Vital Signs BP 140/87   Pulse 90   Temp 98.1 F (36.7 C) (Oral)   Resp 24   SpO2 97%   Physical Exam  Constitutional: She is oriented to person, place, and time. She appears well-developed and well-nourished. No distress.  HENT:  Head: Normocephalic and atraumatic.  Mouth/Throat: Oropharynx is clear and moist.  Eyes: Pupils are equal, round,  and reactive to light.  Neck: Normal range of motion. Neck supple.  Cardiovascular: Normal rate, regular rhythm and normal heart sounds.  Exam reveals no gallop and no friction rub.   No murmur heard. Pulmonary/Chest: Effort normal and breath sounds normal. No respiratory distress. She has no wheezes.  Abdominal: Soft. Bowel sounds are normal. She exhibits no distension. There is no tenderness.  Musculoskeletal: She exhibits edema.  Neurological: She is alert and oriented to person, place, and time. She exhibits normal muscle tone. Coordination normal.  Skin: Skin is warm and dry. No rash noted. No erythema.  Psychiatric: She has a normal mood and affect. Her behavior is normal.  Nursing  note and vitals reviewed.    ED Treatments / Results  Labs (all labs ordered are listed, but only abnormal results are displayed) Labs Reviewed  BASIC METABOLIC PANEL - Abnormal; Notable for the following:       Result Value   Chloride 112 (*)    Glucose, Bld 142 (*)    BUN 41 (*)    Creatinine, Ser 2.57 (*)    Calcium 8.8 (*)    GFR calc non Af Amer 16 (*)    GFR calc Af Amer 18 (*)    All other components within normal limits  CBC - Abnormal; Notable for the following:    RBC 3.77 (*)    Hemoglobin 10.7 (*)    HCT 32.7 (*)    All other components within normal limits  BRAIN NATRIURETIC PEPTIDE - Abnormal; Notable for the following:    B Natriuretic Peptide 1,919.1 (*)    All other components within normal limits  I-STAT TROPOININ, ED - Abnormal; Notable for the following:    Troponin i, poc 0.30 (*)    All other components within normal limits    EKG    Radiology Dg Chest 2 View  Result Date: 09/08/2016 CLINICAL DATA:  Chest tightness with shortness of breath and CHF for 4 days. EXAM: CHEST  2 VIEW COMPARISON:  06/01/2016 FINDINGS: The cardio pericardial silhouette is enlarged. Bibasilar atelectasis noted with tiny bilateral pleural effusions. No evidence for pulmonary edema. The  visualized bony structures of the thorax are intact. IMPRESSION: Cardiomegaly with basilar atelectasis and tiny effusions. Electronically Signed   By: Misty Stanley M.D.   On: 09/08/2016 21:30    Procedures Procedures (including critical care time)  Medications Ordered in ED Medications  furosemide (LASIX) injection 40 mg (40 mg Intravenous Given 09/08/16 2302)     Initial Impression / Assessment and Plan / ED Course  I have reviewed the triage vital signs and the nursing notes.  Pertinent labs & imaging results that were available during my care of the patient were reviewed by me and considered in my medical decision making (see chart for details).     I spoke with cardiology about the patient who will come and evaluate her for admission.  The patient is advised plan and all questions were answered.  Patient will be seen by cardiology  Final Clinical Impressions(s) / ED Diagnoses   Final diagnoses:  None    New Prescriptions New Prescriptions   No medications on file     Dalia Heading, PA-C 09/09/16 0033    Drenda Freeze, MD 09/09/16 1622

## 2016-09-09 NOTE — Care Management Note (Signed)
Case Management Note  Patient Details  Name: [name] MRN: [id number] Date of Birth: [birth year]  Subjective/Objective:                  From home alone, 83 y.o. female. Patient presents to the emergency department with a four-day history of shortness of breath and lower extremity swelling.   Action/Plan: Admit status INPATIENT (Acute on chronic systolic congestive heart failure); anticipate discharge [facility].   Expected Discharge Date:   (unsure)               Expected Discharge Plan:  [place]  In-House Referral:  NA  Discharge planning Services  CM Consult  Post Acute Care Choice:    Choice offered to:     DME Arranged:    DME Agency:     HH Arranged:    HH Agency:     Status of Service:  In process, will continue to follow  If discussed at Long Length of Stay Meetings, dates discussed:    Additional Comments:  Fuller Mandril, RN 09/09/2016, 10:15 AM

## 2016-09-09 NOTE — ED Notes (Signed)
[employer] breakfast tray ordered 

## 2016-09-09 NOTE — Progress Notes (Signed)
Echocardiogram 2D Echocardiogram has been performed.  Aggie Cosier 09/09/2016, 2:28 PM

## 2016-09-09 NOTE — ED Notes (Signed)
Pt placed on 2L of O2 for desaturating to 87 while sleeping

## 2016-09-09 NOTE — ED Notes (Signed)
Attempted to call report

## 2016-09-09 NOTE — ED Notes (Signed)
Nuclear med called to determine if pt was appropriate for VQ scan at this time. Pt is stable and ready for transport.

## 2016-09-09 NOTE — ED Notes (Signed)
[employer] meal tray ordered for lunch.

## 2016-09-10 ENCOUNTER — Encounter (HOSPITAL_COMMUNITY): Payer: Self-pay

## 2016-09-10 LAB — GLUCOSE, CAPILLARY
GLUCOSE-CAPILLARY: 112 mg/dL — AB (ref 65–99)
Glucose-Capillary: 138 mg/dL — ABNORMAL HIGH (ref 65–99)
Glucose-Capillary: 140 mg/dL — ABNORMAL HIGH (ref 65–99)
Glucose-Capillary: 77 mg/dL (ref 65–99)

## 2016-09-10 LAB — BASIC METABOLIC PANEL
ANION GAP: 7 (ref 5–15)
BUN: 45 mg/dL — AB (ref 6–20)
CO2: 25 mmol/L (ref 22–32)
Calcium: 8.2 mg/dL — ABNORMAL LOW (ref 8.9–10.3)
Chloride: 109 mmol/L (ref 101–111)
Creatinine, Ser: 2.47 mg/dL — ABNORMAL HIGH (ref 0.44–1.00)
GFR calc Af Amer: 19 mL/min — ABNORMAL LOW (ref >60)
GFR calc non Af Amer: 17 mL/min — ABNORMAL LOW (ref >60)
GLUCOSE: 149 mg/dL — AB (ref 65–99)
POTASSIUM: 3.8 mmol/L (ref 3.5–5.1)
Sodium: 141 mmol/L (ref 135–145)

## 2016-09-10 MED ORDER — SACUBITRIL-VALSARTAN 24-26 MG PO TABS
1.0000 | ORAL_TABLET | Freq: Two times a day (BID) | ORAL | Status: DC
  Administered 2016-09-10 – 2016-09-11 (×2): 1 via ORAL
  Filled 2016-09-10 (×2): qty 1

## 2016-09-10 MED ORDER — METOPROLOL SUCCINATE ER 25 MG PO TB24
25.0000 mg | ORAL_TABLET | Freq: Every day | ORAL | Status: DC
  Filled 2016-09-10: qty 1

## 2016-09-10 MED ORDER — CARVEDILOL 12.5 MG PO TABS
12.5000 mg | ORAL_TABLET | Freq: Two times a day (BID) | ORAL | Status: DC
  Administered 2016-09-10 – 2016-09-11 (×2): 12.5 mg via ORAL
  Filled 2016-09-10 (×2): qty 1

## 2016-09-10 NOTE — Progress Notes (Signed)
Benefit check for Entresto in progress; B Sanyla Summey RN,MHA,BSN [phone] 

## 2016-09-10 NOTE — Progress Notes (Signed)
MD aware of pt SVT and refusal of metoprolol. Education provided to pt regarding medication, pt still refused. MD stated she will start her on coreg  [name]

## 2016-09-10 NOTE — Progress Notes (Addendum)
RE: Benefit check  Received: Today   Memory Argue         S/[facility] @ [employer] RX # [phone]   ENTRESTO 24-26 MG BID   COVER- YES  CO-PAY- $ 45.00 Q/L 2 PER DAY MORE THEN 2 WILL NEED Prior approval # [id number]  TIER- 3 DRUG  PRIOR APPROVAL- NO  PHARMACY : [facility]    09/11/2016 Delene Loll coupon card given to patient with instructions on usage; Aneta Mins [phone]

## 2016-09-10 NOTE — Progress Notes (Signed)
Progress Note  Patient Name: [name] Date of Encounter: 09/10/2016  Primary Cardiologist: Dr. Percival Spanish  Subjective   Breathing well. No chest pain.   Inpatient Medications    Scheduled Meds: . amLODipine  10 mg Oral QHS  . aspirin EC  81 mg Oral Daily  . calcitRIOL  0.25 mcg Oral Daily  . enoxaparin (LOVENOX) injection  30 mg Subcutaneous Daily  . furosemide  40 mg Intravenous Q12H  . hydrALAZINE  25 mg Oral QID  . insulin aspart  0-9 Units Subcutaneous TID WC  . isosorbide mononitrate  30 mg Oral Daily  . losartan  100 mg Oral Daily  . repaglinide  1 mg Oral TID AC  . sodium chloride flush  3 mL Intravenous Q12H   Continuous Infusions:  PRN Meds: sodium chloride, acetaminophen, nitroGLYCERIN, ondansetron (ZOFRAN) IV, sodium chloride flush, technetium TC 62M diethylenetriame-pentaacetic acid   Vital Signs    Vitals:   09/09/16 1956 09/10/16 0115 09/10/16 0544 09/10/16 0800  BP: (!) 111/55 (!) 143/73 120/66 123/63  Pulse: 77 85 73 72  Resp: 16 16 16 17   Temp: 98.2 F (36.8 C) 98.3 F (36.8 C) 98.2 F (36.8 C) 97.5 F (36.4 C)  TempSrc: Oral Oral Oral Oral  SpO2: 97% 97% 95% 97%  Weight:   127 lb 11.2 oz (57.9 kg)   Height:        Intake/Output Summary (Last 24 hours) at 09/10/16 0943 Last data filed at 09/10/16 0549  Gross per 24 hour  Intake              243 ml  Output             2901 ml  Net            -2658 ml   Filed Weights   09/09/16 1503 09/10/16 0544  Weight: 127 lb 5 oz (57.7 kg) 127 lb 11.2 oz (57.9 kg)    Telemetry    SR with episode of bradycardia (blocked pwave) 15 beat run of NSVT- Personally Reviewed  ECG    SR with LBBB - Personally Reviewed  Physical Exam   GEN: No acute distress.  Neck: No JVD Cardiac: RRR, no murmurs, rubs, or gallops.  Respiratory: Clear to auscultation bilaterally. GI: Soft, nontender, non-distended  MS: No edema; No deformity. 1+ pitting edema right LE.  Neuro:  AAOx3. Psych: Normal  affect  Labs    Chemistry  Recent Labs Lab 09/08/16 1417 09/08/16 1946 09/09/16 0130 09/10/16 0445  NA 142 144  --  141  K 4.5 4.5  --  3.8  CL 111 112*  --  109  CO2 26 24  --  25  GLUCOSE 137* 142*  --  149*  BUN 37* 41*  --  45*  CREATININE 2.31* 2.57* 2.51* 2.47*  CALCIUM 9.2 8.8*  --  8.2*  PROT 6.6  --   --   --   ALBUMIN 3.7  --   --   --   AST 29  --   --   --   ALT 31  --   --   --   ALKPHOS 49  --   --   --   BILITOT 0.8  --   --   --   GFRNONAA  --  16* 16* 17*  GFRAA  --  18* 19* 19*  ANIONGAP  --  8  --  7     Hematology  Recent Labs  Lab 09/08/16 1946 09/09/16 0130  WBC 7.1 6.4  RBC 3.77* 3.86*  HGB 10.7* 10.9*  HCT 32.7* 33.1*  MCV 86.7 85.8  MCH 28.4 28.2  MCHC 32.7 32.9  RDW 14.6 14.5  PLT 166 174    Cardiac EnzymesNo results for input(s): TROPONINI in the last 168 hours.   Recent Labs Lab 09/08/16 1958  TROPIPOC 0.30*     BNP  Recent Labs Lab 09/08/16 1417 09/08/16 1946  BNP  --  1,919.1*  PROBNP 2,251.0*  --      DDimer   Recent Labs Lab 09/08/16 1417  DDIMER 4.51*     Radiology    Dg Chest 2 View  Result Date: 09/08/2016 CLINICAL DATA:  Chest tightness with shortness of breath and CHF for 4 days. EXAM: CHEST  2 VIEW COMPARISON:  06/01/2016 FINDINGS: The cardio pericardial silhouette is enlarged. Bibasilar atelectasis noted with tiny bilateral pleural effusions. No evidence for pulmonary edema. The visualized bony structures of the thorax are intact. IMPRESSION: Cardiomegaly with basilar atelectasis and tiny effusions. Electronically Signed   By: Misty Stanley M.D.   On: 09/08/2016 21:30   Nm Pulmonary Perf And Vent  Result Date: 09/09/2016 CLINICAL DATA:  Elevated D-dimer, shortness of Breath EXAM: NUCLEAR MEDICINE VENTILATION - PERFUSION LUNG SCAN TECHNIQUE: Ventilation images were obtained in multiple projections using inhaled aerosol Tc-69m DTPA. Perfusion images were obtained in multiple projections after  intravenous injection of Tc-45m MAA. RADIOPHARMACEUTICALS:  31.5 mCi Technetium-64m DTPA aerosol inhalation and 4.1 mCi Technetium-64m MAA IV COMPARISON:  Chest x-ray yesterday FINDINGS: Ventilation: No focal ventilation defect. Perfusion: No wedge shaped peripheral perfusion defects to suggest acute pulmonary embolism. IMPRESSION: No evidence of pulmonary embolus. Electronically Signed   By: Rolm Baptise M.D.   On: 09/09/2016 09:04    Cardiac Studies   N/A  Patient Profile     83 y.o. female with PMH of NICM, HTN, PE who presented to the ED after being at her PCP office for edema and found to have an elevated BNP.   Assessment & Plan    1.  Acute on chronic systolic congestive heart failure: BNP elevated on admission. Started on IV lasix with improvement. Dry weight 125lb. Close to euvolemia. Breathing has improved. Still have 1+ pitting edema to right LE, but improved. Transition to oral lasix?  2.  Hx of NICM: POC trop 0.3, No chest pain this morning.  -- Echo showed further reduced EF from 30-15%. Also noted to have more ventricular ectopy on the monitor. No BB in the past due to bradycardia, but should we reconsider it at a low dose? Will discuss with MD. Concern about noted ectopy. Life[employer], or EP consult for ICD? -- continue ARB, is she a candidate for Enresto?  3.  Stage IV chronic kidney disease: Cr stable with IV diuresis.  4.  Anemia  5.  History of pulmonary embolus in 2010: VQ scan was negative for PE. Reports being on coumadin in the past but has been many years. LE dopplers were negative.  6.  Left bundle branch block  Signed, Reino Bellis, NP  09/10/2016, 9:43 AM    Personally seen and examined. Agree with above. Discussed and will add back low dose Toprol 25 QD.  No ICD given advanced age. I do like the idea of Entresto since she is taking losartan 100. Renal function poor but stable over the past year.  Will start Entresto 24/26mg  dose.   Candee Furbish, MD

## 2016-09-10 NOTE — Progress Notes (Signed)
Pt had 15 beats of SVT, stopped cardiology team B in dictation room, aware, no new orders, will continue to monitor   [name]

## 2016-09-10 NOTE — Progress Notes (Signed)
Pt ambulated 350 feet in the hallway. Good effort, pt nonsymptomatic   [name]

## 2016-09-11 ENCOUNTER — Telehealth: Payer: Self-pay | Admitting: *Deleted

## 2016-09-11 ENCOUNTER — Ambulatory Visit: Payer: Medicare Other | Admitting: Cardiology

## 2016-09-11 ENCOUNTER — Other Ambulatory Visit: Payer: Self-pay | Admitting: Cardiology

## 2016-09-11 DIAGNOSIS — N182 Chronic kidney disease, stage 2 (mild): Secondary | ICD-10-CM

## 2016-09-11 LAB — BASIC METABOLIC PANEL
ANION GAP: 11 (ref 5–15)
BUN: 46 mg/dL — ABNORMAL HIGH (ref 6–20)
CALCIUM: 8.7 mg/dL — AB (ref 8.9–10.3)
CO2: 24 mmol/L (ref 22–32)
Chloride: 106 mmol/L (ref 101–111)
Creatinine, Ser: 2.64 mg/dL — ABNORMAL HIGH (ref 0.44–1.00)
GFR, EST AFRICAN AMERICAN: 18 mL/min — AB (ref >60)
GFR, EST NON AFRICAN AMERICAN: 15 mL/min — AB (ref >60)
Glucose, Bld: 124 mg/dL — ABNORMAL HIGH (ref 65–99)
POTASSIUM: 3.8 mmol/L (ref 3.5–5.1)
Sodium: 141 mmol/L (ref 135–145)

## 2016-09-11 LAB — GLUCOSE, CAPILLARY
GLUCOSE-CAPILLARY: 150 mg/dL — AB (ref 65–99)
Glucose-Capillary: 174 mg/dL — ABNORMAL HIGH (ref 65–99)

## 2016-09-11 MED ORDER — CARVEDILOL 6.25 MG PO TABS: 6.2500 mg | ORAL_TABLET | Freq: Two times a day (BID) | ORAL | Status: DC

## 2016-09-11 MED ORDER — HYDRALAZINE HCL 25 MG PO TABS: 25.0000 mg | ORAL_TABLET | Freq: Four times a day (QID) | ORAL | 3 refills | Status: AC

## 2016-09-11 MED ORDER — CARVEDILOL 6.25 MG PO TABS: 6.2500 mg | ORAL_TABLET | Freq: Two times a day (BID) | ORAL | 6 refills | Status: DC

## 2016-09-11 MED ORDER — FUROSEMIDE 20 MG PO TABS: 40.0000 mg | ORAL_TABLET | Freq: Every day | ORAL | 1 refills | Status: DC

## 2016-09-11 MED ORDER — SACUBITRIL-VALSARTAN 24-26 MG PO TABS: 1.0000 | ORAL_TABLET | Freq: Two times a day (BID) | ORAL | 6 refills | Status: AC

## 2016-09-11 MED ORDER — FUROSEMIDE 40 MG PO TABS: 40.0000 mg | ORAL_TABLET | Freq: Every day | ORAL | Status: DC

## 2016-09-11 NOTE — Progress Notes (Signed)
Pt discharge education went over at bedside with charge nurse. Pt IV removed, catheter intact, and pt telemetry box removed. Pt has all discharge paperwork and belongings. Pt discharged via wheelchair with volunteer service  [name]

## 2016-09-11 NOTE — Discharge Summary (Signed)
Discharge Summary    Patient ID: [name],  MRN: [id number], DOB/AGE: 09/13/1927 83 y.o.  Admit date: 09/08/2016 Discharge date: 09/11/2016  Primary Care Provider: Binnie Rail Primary Cardiologist: Dr. Percival Spanish  Discharge Diagnoses    Active Problems:   Acute on chronic systolic congestive heart failure (HCC)   Essential hypertension   SOB (shortness of breath)   Allergies Allergies  Allergen Reactions  . Statins Other (See Comments)    Muscle Aches  . Lisinopril Other (See Comments) and Cough    REACTION: INTOL to ACE's w/ cough  . Penicillins Itching and Rash    Diagnostic Studies/Procedures    TTE: 09/09/16  Study Conclusions  - Left ventricle: The cavity size was normal. Wall thickness was   normal. The estimated ejection fraction was 15%. Diffuse   hypokinesis with minimal regional variation. Doppler parameters   are consistent with restrictive left ventricular diastolic   function and elevated LV filling pressure. - Mitral valve: There was moderate functional regurgitation. - Left atrium: Severely dilated. - Right ventricle: The cavity size was normal. Systolic function is   moderately reduced. - Right atrium: Moderately dilated. - Atrial septum: Type 1R atrial septal aneurysm - bows from left to   right, suggestive of high LA pressure. - Tricuspid valve: There was moderate regurgitation. - Pulmonic valve: There was mild regurgitation. - Pulmonary arteries: PA peak pressure: 47 mm Hg (S). - Systemic veins: The IVC measures <2.1 cm, but does not collapse   >50%, suggesting an elevated RA pressure of 8 mmHg.  Impressions:  - Compared to a prior study in 05/2016, the LVEF is further reduced   to 15% with severe global hypokinesis (from 30-35%). There is   restrictive diastolic physiology with high LV filling pressures. _____________   History of Present Illness     83 year old black female has a history of a nonischemic cardiomyopathy  since at least 2010 with an EF of 30-35%.  She also has diabetes and known hypertension and has a prior history of pulmonary embolus in 2010.  She was treated with Coumadin for a couple of years and then taken off because of risk.  She was recently admitted in October with worsening chest pain and had a non-STEMI.  At that time she was treated medically and her heart failure medicines were increased some.  EF is remained about the same over the years.  She still has exertional chest discomfort but has had a long history of some chest pain which is atypical.  Over a week prior to admission she had some chest discomfort and palpitations were getting ready to go to church but has not had much since then.  About 4 days prior to admission, she began to have increasing edema swelling and began to take Lasix but her legs would not go down.  She was unable to get an appointment with cardiology and saw her primary doctor today who found her BNP to be elevated and also a d-dimer was elevated.  She was sent to the emergency room and was seen that evening by the emergency room physician and deemed to be admitted.  She had mild dyspnea.  She also had edema that has not gone down with furosemide. Denied any dietary indiscretions. There have been no other changes in her medicine. BNP was 1919, stable electrolytes, Cr 2.57, Hgb 10.7. CXR with basilar atelectasis and tiny effusions.   Hospital Course     Consultants: None  She was  admitted with plans for IV diuresis for IV diuresis with follow up echo. Given her hx of PE, with positive Ddimer 4.51, she underwent a VQ scan and lower extremity dopplers. Both were negative. She diuresed a total of 4L during this admission. Cr remained stable with this. Weight dropped from 127>>124lbs.   Follow up echo did show a further reduced EF from 30-15%. Was not on BB therapy at the time of admission due to hx of bradycardia with use. Noted ectopy on telemetry with PVCs and short runs of  NSVT. She was placed on coreg 12.5mg  with HR noted in the 50s, thus reduced to 6.25mg . Also added Entresto given her reduction in EF. She was transitioned to oral lasix on 09/11/16. Walked in the hallway without complaints.   She was seen and assessed by Dr. Marlou Porch on 09/11/16 and determined stable for discharge home. Will be discharged home on Coreg 6.25mg  BID, Entresto 24/26mg  BID, Lasix 40mg  daily, amlodipine 10mg  daily, imdur 30mg  daily and hydralazine 25mg  4x qd. I have arranged for follow up in the office with an APP and BMET in one week.  _____________  Discharge Vitals Blood pressure 114/72, pulse 64, temperature 98.1 F (36.7 C), temperature source Oral, resp. rate 18, height 5\' 1"  (1.549 m), weight 124 lb 14.4 oz (56.7 kg), SpO2 99 %.  Filed Weights   09/09/16 1503 09/10/16 0544 09/11/16 0617  Weight: 127 lb 5 oz (57.7 kg) 127 lb 11.2 oz (57.9 kg) 124 lb 14.4 oz (56.7 kg)    Labs & Radiologic Studies    CBC  Recent Labs  09/08/16 1946 09/09/16 0130  WBC 7.1 6.4  HGB 10.7* 10.9*  HCT 32.7* 33.1*  MCV 86.7 85.8  PLT 166 [id number]   Basic Metabolic Panel  Recent Labs  09/10/16 0445 09/11/16 0452  NA 141 141  K 3.8 3.8  CL 109 106  CO2 25 24  GLUCOSE 149* 124*  BUN 45* 46*  CREATININE 2.47* 2.64*  CALCIUM 8.2* 8.7*   Liver Function Tests  Recent Labs  09/08/16 1417  AST 29  ALT 31  ALKPHOS 49  BILITOT 0.8  PROT 6.6  ALBUMIN 3.7   No results for input(s): LIPASE, AMYLASE in the last 72 hours. Cardiac Enzymes No results for input(s): CKTOTAL, CKMB, CKMBINDEX, TROPONINI in the last 72 hours. BNP Invalid input(s): POCBNP D-Dimer  Recent Labs  09/08/16 1417  DDIMER 4.51*   Hemoglobin A1C No results for input(s): HGBA1C in the last 72 hours. Fasting Lipid Panel No results for input(s): CHOL, HDL, LDLCALC, TRIG, CHOLHDL, LDLDIRECT in the last 72 hours. Thyroid Function Tests No results for input(s): TSH, T4TOTAL, T3FREE, THYROIDAB in the last 72  hours.  Invalid input(s): FREET3 _____________  Dg Chest 2 View  Result Date: 09/08/2016 CLINICAL DATA:  Chest tightness with shortness of breath and CHF for 4 days. EXAM: CHEST  2 VIEW COMPARISON:  06/01/2016 FINDINGS: The cardio pericardial silhouette is enlarged. Bibasilar atelectasis noted with tiny bilateral pleural effusions. No evidence for pulmonary edema. The visualized bony structures of the thorax are intact. IMPRESSION: Cardiomegaly with basilar atelectasis and tiny effusions. Electronically Signed   By: Misty Stanley M.D.   On: 09/08/2016 21:30   Nm Pulmonary Perf And Vent  Result Date: 09/09/2016 CLINICAL DATA:  Elevated D-dimer, shortness of Breath EXAM: NUCLEAR MEDICINE VENTILATION - PERFUSION LUNG SCAN TECHNIQUE: Ventilation images were obtained in multiple projections using inhaled aerosol Tc-12m DTPA. Perfusion images were obtained in multiple projections after intravenous injection  of Tc-48m MAA. RADIOPHARMACEUTICALS:  31.5 mCi Technetium-3m DTPA aerosol inhalation and 4.1 mCi Technetium-16m MAA IV COMPARISON:  Chest x-ray yesterday FINDINGS: Ventilation: No focal ventilation defect. Perfusion: No wedge shaped peripheral perfusion defects to suggest acute pulmonary embolism. IMPRESSION: No evidence of pulmonary embolus. Electronically Signed   By: Rolm Baptise M.D.   On: 09/09/2016 09:04   Disposition   Pt is being discharged home today in good condition.  Follow-up Plans & Appointments    Follow-up Information    Kerin Ransom, [place] Follow up on 09/24/2016.   Specialties:  Cardiology, Radiology Why:  at [place] for your hospital follow up appt.  Contact information: [address] [place] [address] [phone]        [facility] Follow up on 09/18/2016.   Why:  please come in for labs at the office to check your kidney function.  Contact information: [id number] [place] [address]         Discharge  Instructions    (HEART FAILURE PATIENTS) Call MD:  Anytime you have any of the following symptoms: 1) 3 pound weight gain in 24 hours or 5 pounds in 1 week 2) shortness of breath, with or without a dry hacking cough 3) swelling in the hands, feet or stomach 4) if you have to sleep on extra pillows at night in order to breathe.    Complete by:  As directed    Call MD for:  difficulty breathing, headache or visual disturbances    Complete by:  As directed    Diet - low sodium heart healthy    Complete by:  As directed    Discharge instructions    Complete by:  As directed    For patients with congestive heart failure, we give them these special instructions:  1. Follow a low-salt diet and watch your fluid intake. In general, you should not be taking in more than 2 liters of fluid per day (no more than 8 glasses per day). Some patients are restricted to less than 1.5 liters of fluid per day (no more than 6 glasses per day). This includes sources of water in foods like soup, coffee, tea, milk, etc. 2. Weigh yourself on the same scale at same time of day and keep a log. 3. Call your doctor: (Anytime you feel any of the following symptoms)  - 3-4 pound weight gain in 1-2 days or 2 pounds overnight  - Shortness of breath, with or without a dry hacking cough  - Swelling in the hands, feet or stomach  - If you have to sleep on extra pillows at night in order to breathe   IT IS IMPORTANT TO LET YOUR DOCTOR KNOW EARLY ON IF YOU ARE HAVING SYMPTOMS SO WE CAN HELP YOU!   Increase activity slowly    Complete by:  As directed       Discharge Medications   Current Discharge Medication List    START taking these medications   Details  carvedilol (COREG) 6.25 MG tablet Take 1 tablet (6.25 mg total) by mouth 2 (two) times daily with a meal. Qty: 60 tablet, Refills: 6    sacubitril-valsartan (ENTRESTO) 24-26 MG Take 1 tablet by mouth 2 (two) times daily. Qty: 60 tablet, Refills: 6      CONTINUE  these medications which have CHANGED   Details  furosemide (LASIX) 20 MG tablet Take 2 tablets (40 mg total) by mouth daily. Qty: 30 tablet, Refills:  1    hydrALAZINE (APRESOLINE) 25 MG tablet Take 1 tablet (25 mg total) by mouth 4 (four) times daily. Qty: 120 tablet, Refills: 3      CONTINUE these medications which have NOT CHANGED   Details  amLODipine (NORVASC) 10 MG tablet Take 10 mg by mouth at bedtime.    Ascorbic Acid (VITAMIN C) 1000 MG tablet Take 1,000 mg by mouth daily as needed (FOR IMMUNE).    aspirin EC 81 MG tablet Take 1 tablet (81 mg total) by mouth daily.    calcitRIOL (ROCALTROL) 0.25 MCG capsule Take 0.25 mcg by mouth daily.    isosorbide mononitrate (IMDUR) 30 MG 24 hr tablet Take 1 tablet (30 mg total) by mouth daily. Qty: 30 tablet, Refills: 2    nitroGLYCERIN (NITROSTAT) 0.4 MG SL tablet Place 1 tablet (0.4 mg total) under the tongue every 5 (five) minutes as needed for chest pain. Qty: 50 tablet, Refills: 0    repaglinide (PRANDIN) 2 MG tablet TAKE 1 TABLET (2 MG TOTAL) BY MOUTH 3 (THREE) TIMES DAILY BEFORE MEALS. Qty: 90 tablet, Refills: 0      STOP taking these medications     losartan (COZAAR) 100 MG tablet          Outstanding Labs/Studies   BMET in a week.   Duration of Discharge Encounter   Greater than 30 minutes including physician time.  Signed, Reino Bellis NP-C 09/11/2016, 10:40 AM  Personally seen and examined. Agree with above. Looks better Lungs clear OK to DC.  Entresto new start  Candee Furbish, MD

## 2016-09-11 NOTE — Telephone Encounter (Signed)
Pt was on TCM list admitted for Acute on chronic systolic congestive heart failure, Hypertension, and SOB. She was admitted with plans for IV diuresis for IV diuresis with follow up echo. Given her hx of PE, with positive Ddimer 4.51, she underwent a VQ scan and lower extremity dopplers. Both were negative. She diuresed a total of 4L during this admission. Cr remained stable with this. Weight dropped from 127>>124lbs. Pt D/C 1/26, and will f/u w/cardiologistLuke Kilroy, PA-C Follow up on 09/24/2016...Johny Chess

## 2016-09-11 NOTE — Progress Notes (Signed)
Progress Note  Patient Name: [name] Date of Encounter: 09/11/2016  Primary Cardiologist: Dr. Percival Spanish  Subjective   Feels well, walked the hallway yesterday   Inpatient Medications    Scheduled Meds: . amLODipine  10 mg Oral QHS  . aspirin EC  81 mg Oral Daily  . calcitRIOL  0.25 mcg Oral Daily  . carvedilol  6.25 mg Oral BID WC  . enoxaparin (LOVENOX) injection  30 mg Subcutaneous Daily  . furosemide  40 mg Intravenous Q12H  . hydrALAZINE  25 mg Oral QID  . insulin aspart  0-9 Units Subcutaneous TID WC  . isosorbide mononitrate  30 mg Oral Daily  . repaglinide  1 mg Oral TID AC  . sacubitril-valsartan  1 tablet Oral BID  . sodium chloride flush  3 mL Intravenous Q12H   Continuous Infusions:  PRN Meds: sodium chloride, acetaminophen, nitroGLYCERIN, ondansetron (ZOFRAN) IV, sodium chloride flush, technetium TC 26M diethylenetriame-pentaacetic acid   Vital Signs    Vitals:   09/10/16 2002 09/10/16 2355 09/11/16 0608 09/11/16 0617  BP: (!) 110/50 (!) 142/73 118/69   Pulse: 61 80 62   Resp: 16 18    Temp: 98.2 F (36.8 C) 97.8 F (36.6 C) 98.2 F (36.8 C)   TempSrc: Oral Oral Oral   SpO2: 97% 98% 99%   Weight:    124 lb 14.4 oz (56.7 kg)  Height:        Intake/Output Summary (Last 24 hours) at 09/11/16 0823 Last data filed at 09/11/16 [id number]  Gross per 24 hour  Intake             1080 ml  Output             2700 ml  Net            -1620 ml   Filed Weights   09/09/16 1503 09/10/16 0544 09/11/16 0617  Weight: 127 lb 5 oz (57.7 kg) 127 lb 11.2 oz (57.9 kg) 124 lb 14.4 oz (56.7 kg)    Telemetry    SR with episode of bradycardia (blocked pwave) 15 beat run of NSVT- Personally Reviewed  ECG    SR with LBBB - Personally Reviewed  Physical Exam   GEN: No acute distress.  Neck: No JVD Cardiac: RRR, no murmurs, rubs, or gallops.  Respiratory: Clear to auscultation bilaterally. GI: Soft, nontender, non-distended  MS: No edema; No deformity. No  edema Neuro:  AAOx3. Psych: Normal affect  Labs    Chemistry  Recent Labs Lab 09/08/16 1417  09/08/16 1946 09/09/16 0130 09/10/16 0445 09/11/16 0452  NA 142  --  144  --  141 141  K 4.5  --  4.5  --  3.8 3.8  CL 111  --  112*  --  109 106  CO2 26  --  24  --  25 24  GLUCOSE 137*  --  142*  --  149* 124*  BUN 37*  --  41*  --  45* 46*  CREATININE 2.31*  --  2.57* 2.51* 2.47* 2.64*  CALCIUM 9.2  --  8.8*  --  8.2* 8.7*  PROT 6.6  --   --   --   --   --   ALBUMIN 3.7  --   --   --   --   --   AST 29  --   --   --   --   --   ALT 31  --   --   --   --   --  ALKPHOS 49  --   --   --   --   --   BILITOT 0.8  --   --   --   --   --   GFRNONAA  --   < > 16* 16* 17* 15*  GFRAA  --   < > 18* 19* 19* 18*  ANIONGAP  --   --  8  --  7 11  < > = values in this interval not displayed.   Hematology  Recent Labs Lab 09/08/16 1946 09/09/16 0130  WBC 7.1 6.4  RBC 3.77* 3.86*  HGB 10.7* 10.9*  HCT 32.7* 33.1*  MCV 86.7 85.8  MCH 28.4 28.2  MCHC 32.7 32.9  RDW 14.6 14.5  PLT 166 174    Cardiac EnzymesNo results for input(s): TROPONINI in the last 168 hours.   Recent Labs Lab 09/08/16 1958  TROPIPOC 0.30*     BNP  Recent Labs Lab 09/08/16 1417 09/08/16 1946  BNP  --  1,919.1*  PROBNP 2,251.0*  --      DDimer   Recent Labs Lab 09/08/16 1417  DDIMER 4.51*     Radiology    Nm Pulmonary Perf And Vent  Result Date: 09/09/2016 CLINICAL DATA:  Elevated D-dimer, shortness of Breath EXAM: NUCLEAR MEDICINE VENTILATION - PERFUSION LUNG SCAN TECHNIQUE: Ventilation images were obtained in multiple projections using inhaled aerosol Tc-39m DTPA. Perfusion images were obtained in multiple projections after intravenous injection of Tc-44m MAA. RADIOPHARMACEUTICALS:  31.5 mCi Technetium-26m DTPA aerosol inhalation and 4.1 mCi Technetium-30m MAA IV COMPARISON:  Chest x-ray yesterday FINDINGS: Ventilation: No focal ventilation defect. Perfusion: No wedge shaped peripheral  perfusion defects to suggest acute pulmonary embolism. IMPRESSION: No evidence of pulmonary embolus. Electronically Signed   By: Rolm Baptise M.D.   On: 09/09/2016 09:04    Cardiac Studies   N/A  Patient Profile     83 y.o. female with PMH of NICM, HTN, PE who presented to the ED after being at her PCP office for edema and found to have an elevated BNP.   Assessment & Plan    1.  Acute on chronic systolic congestive heart failure: BNP elevated on admission. Started on IV lasix with improvement. Dry weight 125lb. Close to euvolemia. Breathing has improved. No longer had LE edema.  -- Will transition to oral lasix today. Possible discharge.   2.  Hx of NICM: POC trop 0.3, No chest pain this morning.  -- Echo showed further reduced EF from 30-15%. Also noted to have more ventricular ectopy on the monitor. Reported bradycardia in the past with BB, but none noted this admission -- attempted to add metoprolol 25mg  yesterday but patient refused, agreed to  coreg 12.5mg  BID yesterday, HR in the 50s, will reduce to 6.25mg  BID. No longer noting ectopy on telemetry -- started on Entresto yesterday, blood pressure stable.   3.  Stage IV chronic kidney disease: Cr with slight bump today.   4.  Anemia  5.  History of pulmonary embolus in 2010: VQ scan was negative for PE. Reports being on coumadin in the past but has been many years. LE dopplers were negative.  6.  Left bundle branch block  Signed, Reino Bellis, NP  09/11/2016, 8:23 AM    Personally seen and examined. Agree with above. Looks better Lungs clear OK to DC.  Entresto new start  Candee Furbish, MD

## 2016-09-14 ENCOUNTER — Telehealth: Payer: Self-pay | Admitting: *Deleted

## 2016-09-14 ENCOUNTER — Encounter: Payer: Medicare Other | Attending: Endocrinology | Admitting: Nutrition

## 2016-09-14 DIAGNOSIS — E119 Type 2 diabetes mellitus without complications: Secondary | ICD-10-CM | POA: Insufficient documentation

## 2016-09-14 DIAGNOSIS — Z713 Dietary counseling and surveillance: Secondary | ICD-10-CM | POA: Diagnosis not present

## 2016-09-14 NOTE — Telephone Encounter (Signed)
Pt was on the patient ping list admitted 1/23 for Acute on chronic systolic congestive heart failure,   Essential hypertension, and   SOB (shortness of breath). Pt was D/C 1/26, and was told to f/u w/cardiologist Dr. Kerin Ransom on 09/24/16...[name]

## 2016-09-14 NOTE — Progress Notes (Signed)
Pt. Was shown how to use a vial and syringe to inject NPH insulin.  She re demonstrated how to draw up the insulin correctly, and had no questions.  She was shown the different sites to use for injection.  She had taken Lantus via pen, several years ago.  She wants the pen, and was told to call the pharmacy to see what the cost difference would be. We also discussed low blood sugars--symptoms and treatments.  She reported good understanding of this. She is not testing her blood sugars regularly.  I tested her blood sugar in the office today and it was 102 fasting.  She was told that if she is to start insulin, that she will need to test once a day.  She agreed to do this.   She was told that Dr. Cordelia Pen office will notify her if she is to start insulin, and the dosage.  She reported good understanding of this.

## 2016-09-16 NOTE — Patient Instructions (Signed)
Call the pharmacy to see if you can afford the pen insulins.

## 2016-09-24 ENCOUNTER — Emergency Department (HOSPITAL_COMMUNITY): Payer: Medicare Other

## 2016-09-24 ENCOUNTER — Observation Stay (HOSPITAL_COMMUNITY): Payer: Medicare Other

## 2016-09-24 ENCOUNTER — Ambulatory Visit: Payer: Medicare Other | Admitting: Cardiology

## 2016-09-24 ENCOUNTER — Encounter (HOSPITAL_COMMUNITY): Payer: Self-pay | Admitting: Emergency Medicine

## 2016-09-24 ENCOUNTER — Inpatient Hospital Stay (HOSPITAL_COMMUNITY)
Admission: EM | Admit: 2016-09-24 | Discharge: 2016-09-27 | DRG: 281 | Disposition: A | Discharge status: Home / Self Care | Payer: Medicare Other | Attending: Internal Medicine | Admitting: Internal Medicine

## 2016-09-24 DIAGNOSIS — I214 Non-ST elevation (NSTEMI) myocardial infarction: Principal | ICD-10-CM | POA: Diagnosis present

## 2016-09-24 DIAGNOSIS — I5022 Chronic systolic (congestive) heart failure: Secondary | ICD-10-CM | POA: Diagnosis not present

## 2016-09-24 DIAGNOSIS — Z9849 Cataract extraction status, unspecified eye: Secondary | ICD-10-CM

## 2016-09-24 DIAGNOSIS — K219 Gastro-esophageal reflux disease without esophagitis: Secondary | ICD-10-CM | POA: Diagnosis present

## 2016-09-24 DIAGNOSIS — R7989 Other specified abnormal findings of blood chemistry: Secondary | ICD-10-CM

## 2016-09-24 DIAGNOSIS — R791 Abnormal coagulation profile: Secondary | ICD-10-CM | POA: Diagnosis not present

## 2016-09-24 DIAGNOSIS — I43 Cardiomyopathy in diseases classified elsewhere: Secondary | ICD-10-CM | POA: Diagnosis present

## 2016-09-24 DIAGNOSIS — Z87891 Personal history of nicotine dependence: Secondary | ICD-10-CM | POA: Diagnosis not present

## 2016-09-24 DIAGNOSIS — R079 Chest pain, unspecified: Secondary | ICD-10-CM

## 2016-09-24 DIAGNOSIS — R778 Other specified abnormalities of plasma proteins: Secondary | ICD-10-CM | POA: Diagnosis present

## 2016-09-24 DIAGNOSIS — I480 Paroxysmal atrial fibrillation: Secondary | ICD-10-CM | POA: Diagnosis present

## 2016-09-24 DIAGNOSIS — I447 Left bundle-branch block, unspecified: Secondary | ICD-10-CM | POA: Diagnosis not present

## 2016-09-24 DIAGNOSIS — Z880 Allergy status to penicillin: Secondary | ICD-10-CM

## 2016-09-24 DIAGNOSIS — Z888 Allergy status to other drugs, medicaments and biological substances status: Secondary | ICD-10-CM | POA: Diagnosis not present

## 2016-09-24 DIAGNOSIS — N184 Chronic kidney disease, stage 4 (severe): Secondary | ICD-10-CM | POA: Diagnosis present

## 2016-09-24 DIAGNOSIS — I2510 Atherosclerotic heart disease of native coronary artery without angina pectoris: Secondary | ICD-10-CM | POA: Diagnosis not present

## 2016-09-24 DIAGNOSIS — I10 Essential (primary) hypertension: Secondary | ICD-10-CM | POA: Diagnosis present

## 2016-09-24 DIAGNOSIS — R0789 Other chest pain: Secondary | ICD-10-CM | POA: Diagnosis not present

## 2016-09-24 DIAGNOSIS — I130 Hypertensive heart and chronic kidney disease with heart failure and stage 1 through stage 4 chronic kidney disease, or unspecified chronic kidney disease: Secondary | ICD-10-CM | POA: Diagnosis not present

## 2016-09-24 DIAGNOSIS — Z79899 Other long term (current) drug therapy: Secondary | ICD-10-CM

## 2016-09-24 DIAGNOSIS — Z7982 Long term (current) use of aspirin: Secondary | ICD-10-CM | POA: Diagnosis not present

## 2016-09-24 DIAGNOSIS — Z8249 Family history of ischemic heart disease and other diseases of the circulatory system: Secondary | ICD-10-CM

## 2016-09-24 DIAGNOSIS — I252 Old myocardial infarction: Secondary | ICD-10-CM

## 2016-09-24 DIAGNOSIS — Z86711 Personal history of pulmonary embolism: Secondary | ICD-10-CM | POA: Diagnosis not present

## 2016-09-24 DIAGNOSIS — E1122 Type 2 diabetes mellitus with diabetic chronic kidney disease: Secondary | ICD-10-CM | POA: Diagnosis not present

## 2016-09-24 DIAGNOSIS — R000 Tachycardia, unspecified: Secondary | ICD-10-CM | POA: Diagnosis not present

## 2016-09-24 DIAGNOSIS — I200 Unstable angina: Secondary | ICD-10-CM | POA: Diagnosis not present

## 2016-09-24 DIAGNOSIS — I5042 Chronic combined systolic (congestive) and diastolic (congestive) heart failure: Secondary | ICD-10-CM | POA: Diagnosis not present

## 2016-09-24 DIAGNOSIS — I4891 Unspecified atrial fibrillation: Secondary | ICD-10-CM | POA: Diagnosis present

## 2016-09-24 DIAGNOSIS — E119 Type 2 diabetes mellitus without complications: Secondary | ICD-10-CM

## 2016-09-24 DIAGNOSIS — E7800 Pure hypercholesterolemia, unspecified: Secondary | ICD-10-CM | POA: Diagnosis not present

## 2016-09-24 LAB — BASIC METABOLIC PANEL
Anion gap: 14 (ref 5–15)
BUN: 32 mg/dL — AB (ref 6–20)
CALCIUM: 9.5 mg/dL (ref 8.9–10.3)
CO2: 24 mmol/L (ref 22–32)
Chloride: 103 mmol/L (ref 101–111)
Creatinine, Ser: 2.31 mg/dL — ABNORMAL HIGH (ref 0.44–1.00)
GFR calc Af Amer: 21 mL/min — ABNORMAL LOW (ref >60)
GFR, EST NON AFRICAN AMERICAN: 18 mL/min — AB (ref >60)
GLUCOSE: 258 mg/dL — AB (ref 65–99)
POTASSIUM: 4.2 mmol/L (ref 3.5–5.1)
Sodium: 141 mmol/L (ref 135–145)

## 2016-09-24 LAB — TROPONIN I
TROPONIN I: 0.89 ng/mL — AB (ref <0.03)
TROPONIN I: 1.74 ng/mL — AB (ref <0.03)
TROPONIN I: 3.46 ng/mL — AB (ref <0.03)
TROPONIN I: 9.11 ng/mL — AB (ref <0.03)
Troponin I: 0.63 ng/mL (ref <0.03)

## 2016-09-24 LAB — CBC
HEMATOCRIT: 42.4 % (ref 36.0–46.0)
Hemoglobin: 13.5 g/dL (ref 12.0–15.0)
MCH: 28 pg (ref 26.0–34.0)
MCHC: 31.8 g/dL (ref 30.0–36.0)
MCV: 88 fL (ref 78.0–100.0)
Platelets: 166 10*3/uL (ref 150–400)
RBC: 4.82 MIL/uL (ref 3.87–5.11)
RDW: 14.2 % (ref 11.5–15.5)
WBC: 6.2 10*3/uL (ref 4.0–10.5)

## 2016-09-24 LAB — I-STAT TROPONIN, ED: Troponin i, poc: 0.09 ng/mL (ref 0.00–0.08)

## 2016-09-24 LAB — D-DIMER, QUANTITATIVE: D-Dimer, Quant: 1.18 ug/mL-FEU — ABNORMAL HIGH (ref 0.00–0.50)

## 2016-09-24 LAB — BRAIN NATRIURETIC PEPTIDE: B Natriuretic Peptide: 1016.2 pg/mL — ABNORMAL HIGH (ref 0.0–100.0)

## 2016-09-24 MED ORDER — GI COCKTAIL ~~LOC~~
30.0000 mL | Freq: Once | ORAL | Status: DC
Start: 2016-09-24 — End: 2016-09-27
  Filled 2016-09-24: qty 30

## 2016-09-24 MED ORDER — ASPIRIN EC 81 MG PO TBEC
81.0000 mg | DELAYED_RELEASE_TABLET | Freq: Every day | ORAL | Status: DC
  Administered 2016-09-25 – 2016-09-27 (×3): 81 mg via ORAL
  Filled 2016-09-24 (×3): qty 1

## 2016-09-24 MED ORDER — ONDANSETRON HCL 4 MG/2ML IJ SOLN: 4.0000 mg | Freq: Four times a day (QID) | INTRAMUSCULAR | Status: DC | PRN

## 2016-09-24 MED ORDER — FUROSEMIDE 40 MG PO TABS
40.0000 mg | ORAL_TABLET | Freq: Every day | ORAL | Status: DC
  Administered 2016-09-24 – 2016-09-26 (×3): 40 mg via ORAL
  Filled 2016-09-24: qty 1
  Filled 2016-09-24: qty 2
  Filled 2016-09-24: qty 1

## 2016-09-24 MED ORDER — CARVEDILOL 6.25 MG PO TABS
6.2500 mg | ORAL_TABLET | Freq: Two times a day (BID) | ORAL | Status: DC
  Administered 2016-09-24 – 2016-09-27 (×7): 6.25 mg via ORAL
  Filled 2016-09-24 (×7): qty 1

## 2016-09-24 MED ORDER — TECHNETIUM TC 99M DIETHYLENETRIAME-PENTAACETIC ACID: 30.0000 | Freq: Once | INTRAVENOUS | Status: DC | PRN

## 2016-09-24 MED ORDER — VITAMIN C 500 MG PO TABS: 1000.0000 mg | ORAL_TABLET | Freq: Every day | ORAL | Status: DC | PRN

## 2016-09-24 MED ORDER — SACUBITRIL-VALSARTAN 24-26 MG PO TABS
1.0000 | ORAL_TABLET | Freq: Two times a day (BID) | ORAL | Status: DC
  Administered 2016-09-24 – 2016-09-27 (×7): 1 via ORAL
  Filled 2016-09-24 (×8): qty 1

## 2016-09-24 MED ORDER — AMLODIPINE BESYLATE 10 MG PO TABS
10.0000 mg | ORAL_TABLET | Freq: Every day | ORAL | Status: DC
  Administered 2016-09-24 – 2016-09-26 (×3): 10 mg via ORAL
  Filled 2016-09-24 (×3): qty 1

## 2016-09-24 MED ORDER — MORPHINE SULFATE (PF) 4 MG/ML IV SOLN
4.0000 mg | Freq: Once | INTRAVENOUS | Status: AC
  Administered 2016-09-24: 4 mg via INTRAVENOUS
  Filled 2016-09-24: qty 1

## 2016-09-24 MED ORDER — NITROGLYCERIN 0.4 MG SL SUBL
0.4000 mg | SUBLINGUAL_TABLET | SUBLINGUAL | Status: DC | PRN
Start: 2016-09-24 — End: 2016-09-27
  Filled 2016-09-24: qty 1

## 2016-09-24 MED ORDER — TECHNETIUM TO 99M ALBUMIN AGGREGATED
3.0000 | Freq: Once | INTRAVENOUS | Status: AC | PRN
  Administered 2016-09-24: 3 via INTRAVENOUS

## 2016-09-24 MED ORDER — NITROGLYCERIN 0.4 MG SL SUBL: 0.4000 mg | SUBLINGUAL_TABLET | SUBLINGUAL | Status: DC | PRN

## 2016-09-24 MED ORDER — MORPHINE SULFATE (PF) 4 MG/ML IV SOLN: 2.0000 mg | INTRAVENOUS | Status: DC | PRN

## 2016-09-24 MED ORDER — HYDRALAZINE HCL 25 MG PO TABS
25.0000 mg | ORAL_TABLET | Freq: Four times a day (QID) | ORAL | Status: DC
  Administered 2016-09-24 – 2016-09-27 (×13): 25 mg via ORAL
  Filled 2016-09-24 (×13): qty 1

## 2016-09-24 MED ORDER — ACETAMINOPHEN 325 MG PO TABS: 650.0000 mg | ORAL_TABLET | ORAL | Status: DC | PRN

## 2016-09-24 MED ORDER — ISOSORBIDE MONONITRATE ER 30 MG PO TB24
30.0000 mg | ORAL_TABLET | Freq: Every day | ORAL | Status: DC
  Administered 2016-09-24 – 2016-09-27 (×4): 30 mg via ORAL
  Filled 2016-09-24 (×4): qty 1

## 2016-09-24 MED ORDER — HEPARIN SODIUM (PORCINE) 5000 UNIT/ML IJ SOLN
5000.0000 [IU] | Freq: Three times a day (TID) | INTRAMUSCULAR | Status: DC
  Administered 2016-09-24 – 2016-09-27 (×9): 5000 [IU] via SUBCUTANEOUS
  Filled 2016-09-24 (×9): qty 1

## 2016-09-24 MED ORDER — GI COCKTAIL ~~LOC~~: 30.0000 mL | Freq: Four times a day (QID) | ORAL | Status: DC | PRN

## 2016-09-24 MED ORDER — CALCITRIOL 0.25 MCG PO CAPS
0.2500 ug | ORAL_CAPSULE | Freq: Every day | ORAL | Status: DC
  Administered 2016-09-24 – 2016-09-27 (×4): 0.25 ug via ORAL
  Filled 2016-09-24 (×4): qty 1

## 2016-09-24 NOTE — ED Notes (Signed)
Pt transported to [facility] 

## 2016-09-24 NOTE — Progress Notes (Signed)
Patient's troponin 9.11.  Pt is asymptomatic at this time, VSS.  MD Hochrein paged and made aware.  RN will continue to monitor patient closely.  Claudette Stapler, RN

## 2016-09-24 NOTE — ED Notes (Signed)
repaged cards/TURNER

## 2016-09-24 NOTE — ED Triage Notes (Signed)
Pt arrived EMS from home with reports of sharp L sided chest pain with radiation under left arm starting approx 2 hours ago. Pt took 3 nitro prior to EMS arrival and had 1 nitro with EMS en route, as well as 324 ASA. Per Pt she has a hx of DM and MI last one in November 2017.

## 2016-09-24 NOTE — H&P (Signed)
History and Physical    [name] [id number] DOB: 1927-10-11 DOA: 09/24/2016  PCP: Binnie Rail, MD Patient coming from: home  Chief Complaint: chest pain  HPI: [name] is a delightful 83 y.o. female with medical history significant for ischemic cardiomyopathy with an EF of 30-35%, diabetes, hypertension, PE, chronic systolic heart failure, since to the emergency department with the chief complaint of chest pain. Patient's being admitted for rule out.  Information is obtained from the patient. She reports being discharged from the hospital one week ago after a four-day stay for acute heart failure and lower extremity edema. She is not followed up with audiology yet but says she was doing well at home was awake this morning around 2 AM and developed sudden onset of left anterior chest pain radiating to the left shoulder and down the left arm. She describes the pain as sharp constant. She took nitroglycerin and aspirin with little relief. She denies headache dizziness syncope or near-syncope. She denies shortness of breath diaphoresis nausea vomiting. She denies any lower extremity edema or orthopnea. She denies cough fever chills sick contacts. Review does indicate he has a long history of some chest pain with exertion that is atypical. She states she called EMS and they gave her another nitroglycerin. At the point of admission she is pain-free but states that that didn't happen until she was provided with morphine.   ED Course: In the emergency department she's afebrile hypertensive with a heart rate of 129. She is not hypoxic. She is provided with  mg of morphine.  Review of Systems: As per HPI otherwise 10 point review of systems negative.   Ambulatory Status: Ambulates independently with a fairly steady gait. Reports no recent falls. Lives alone is independent with ADLs. Daughter lives nearby  Past Medical History:  Diagnosis Date  . Anxiety   . Benign neoplasm of  adrenal gland   . CAD (coronary artery disease)    a. nonobstructive disease by cath in 2010. b. NSTEMI 05/2016: cath not performed secondary to Stage 4 CKD  . Calculus of kidney   . CHF (congestive heart failure) ([place])   . Diabetes mellitus    hx noncompliance with rx'd tx  . Diverticulosis of colon (without mention of hemorrhage)   . DJD (degenerative joint disease)   . GERD (gastroesophageal reflux disease)    hx DU with H pylori 01/2009 and GIB, s/p triple rx  . GI bleed 2012   duoedonal ulcer  . H. pylori infection   . Hx of subarachnoid hemorrhage 2001  . Hypercholesterolemia   . Hypertension   . LBBB (left bundle branch block)   . Nonischemic cardiomyopathy ([place])    hypertensive  . Nontoxic multinodular goiter   . Paroxysmal atrial fibrillation (HCC)    not anticoag candidate  . Pulmonary embolism ([place]) 02/2009   on anticoag thru 11/2010 - stopped due to risk>benefit  . Pure hypercholesterolemia   . Tubulovillous adenoma of colon 2012    Past Surgical History:  Procedure Laterality Date  . CATARACT EXTRACTION    . KNEE SURGERY     left  . SHOULDER SURGERY     right  . TONSILLECTOMY      Social History   Social History  . Marital status: Widowed    Spouse name: N/A  . Number of children: 1  . Years of education: N/A   Occupational History  . retired Retired   Social History Main Topics  . Smoking  status: Former Smoker    Packs/day: 0.30    Years: 36.00    Types: Cigarettes    Quit date: 08/18/1983  . Smokeless tobacco: Never Used  . Alcohol use 0.6 oz/week    1 Glasses of wine per week     Comment: occ wine, beer during holidays or at social events  . Drug use: No  . Sexual activity: Not Currently   Other Topics Concern  . Not on file   Social History Narrative   Widow, 1 daughter    Allergies  Allergen Reactions  . Statins Other (See Comments)    Muscle Aches  . Lisinopril Other (See Comments) and Cough    REACTION: INTOL to ACE's w/ cough    . Penicillins Itching and Rash    Family History  Problem Relation Age of Onset  . Lung cancer Mother   . Brain cancer Mother   . Goiter Mother   . Cancer Mother   . Heart disease Mother   . Diabetes Mother   . Diabetes Maternal Grandmother   . Heart disease Maternal Aunt     x2  . Colon cancer Neg Hx     Prior to Admission medications   Medication Sig Start Date End Date Taking? Authorizing Provider  amLODipine (NORVASC) 10 MG tablet Take 10 mg by mouth at bedtime.   Yes Historical Provider, MD  Ascorbic Acid (VITAMIN C) 1000 MG tablet Take 1,000 mg by mouth daily as needed (FOR IMMUNE).   Yes Historical Provider, MD  aspirin EC 81 MG tablet Take 1 tablet (81 mg total) by mouth daily. 06/15/16  Yes Binnie Rail, MD  calcitRIOL (ROCALTROL) 0.25 MCG capsule Take 0.25 mcg by mouth daily. 05/07/16  Yes Historical Provider, MD  carvedilol (COREG) 6.25 MG tablet Take 1 tablet (6.25 mg total) by mouth 2 (two) times daily with a meal. 09/11/16  Yes Cheryln Manly, NP  furosemide (LASIX) 20 MG tablet Take 2 tablets (40 mg total) by mouth daily. 09/11/16  Yes Cheryln Manly, NP  hydrALAZINE (APRESOLINE) 25 MG tablet Take 1 tablet (25 mg total) by mouth 4 (four) times daily. 09/11/16  Yes Cheryln Manly, NP  isosorbide mononitrate (IMDUR) 30 MG 24 hr tablet Take 1 tablet (30 mg total) by mouth daily. 06/15/16  Yes Binnie Rail, MD  nitroGLYCERIN (NITROSTAT) 0.4 MG SL tablet Place 1 tablet (0.4 mg total) under the tongue every 5 (five) minutes as needed for chest pain. 06/15/16  Yes Binnie Rail, MD  repaglinide (PRANDIN) 2 MG tablet TAKE 1 TABLET (2 MG TOTAL) BY MOUTH 3 (THREE) TIMES DAILY BEFORE MEALS. Patient taking differently: TAKE 1 TABLET (2 MG TOTAL) BY MOUTH 2 TIMES DAILY BEFORE MEALS. 08/29/16  Yes Renato Shin, MD  sacubitril-valsartan (ENTRESTO) 24-26 MG Take 1 tablet by mouth 2 (two) times daily. 09/11/16  Yes Cheryln Manly, NP    Physical Exam: Vitals:   09/24/16  0615 09/24/16 0630 09/24/16 0645 09/24/16 0723  BP: 141/90 131/91 150/98 145/94  Pulse: 81 77 94 93  Resp: 15 15 17 21   Temp:      TempSrc:      SpO2: 100% 98% 100% 99%  Weight:      Height:         General:  Appears calm and comfortable, sitting up in bed in no acute distress Eyes:  PERRL, EOMI, normal lids, iris ENT:  grossly normal hearing, lips & tongue, mucous membranes of her mouth  are pink slightly dry Neck:  no LAD, masses or thyromegaly Cardiovascular:  RRR, no m/r/g. No LE edema. Pulses present and palpable. Chest nontender to palpation Respiratory:  CTA bilaterally, no w/r/r. Normal respiratory effort. Abdomen:  soft, ntnd, is a bowel sounds throughout no guarding or rebounding Skin:  no rash or induration seen on limited exam Musculoskeletal:  grossly normal tone BUE/BLE, good ROM, no bony abnormality Psychiatric:  grossly normal mood and affect, speech fluent and appropriate, AOx3 Neurologic:  CN 2-12 grossly intact, moves all extremities in coordinated fashion, sensation intact  Labs on Admission: I have personally reviewed following labs and imaging studies  CBC:  Recent Labs Lab 09/24/16 0440  WBC 6.2  HGB 13.5  HCT 42.4  MCV 88.0  PLT [id number]   Basic Metabolic Panel:  Recent Labs Lab 09/24/16 0440  NA 141  K 4.2  CL 103  CO2 24  GLUCOSE 258*  BUN 32*  CREATININE 2.31*  CALCIUM 9.5   GFR: Estimated Creatinine Clearance: 13.8 mL/min (by C-G formula based on SCr of 2.31 mg/dL (H)). Liver Function Tests: No results for input(s): AST, ALT, ALKPHOS, BILITOT, PROT, ALBUMIN in the last 168 hours. No results for input(s): LIPASE, AMYLASE in the last 168 hours. No results for input(s): AMMONIA in the last 168 hours. Coagulation Profile: No results for input(s): INR, PROTIME in the last 168 hours. Cardiac Enzymes:  Recent Labs Lab 09/24/16 0415  TROPONINI 0.63*   BNP (last 3 results)  Recent Labs  09/08/16 1417  PROBNP 2,251.0*   HbA1C: No  results for input(s): HGBA1C in the last 72 hours. CBG: No results for input(s): GLUCAP in the last 168 hours. Lipid Profile: No results for input(s): CHOL, HDL, LDLCALC, TRIG, CHOLHDL, LDLDIRECT in the last 72 hours. Thyroid Function Tests: No results for input(s): TSH, T4TOTAL, FREET4, T3FREE, THYROIDAB in the last 72 hours. Anemia Panel: No results for input(s): VITAMINB12, FOLATE, FERRITIN, TIBC, IRON, RETICCTPCT in the last 72 hours. Urine analysis:    Component Value Date/Time   COLORURINE YELLOW 10/09/2013 0346   APPEARANCEUR CLEAR 10/09/2013 0346   LABSPEC 1.011 10/09/2013 0346   PHURINE 7.0 10/09/2013 0346   GLUCOSEU 250 (A) 10/09/2013 0346   HGBUR SMALL (A) 10/09/2013 0346   BILIRUBINUR NEGATIVE 10/09/2013 0346   KETONESUR NEGATIVE 10/09/2013 0346   PROTEINUR 100 (A) 10/09/2013 0346   UROBILINOGEN 0.2 10/09/2013 0346   NITRITE NEGATIVE 10/09/2013 0346   LEUKOCYTESUR TRACE (A) 10/09/2013 0346    Creatinine Clearance: Estimated Creatinine Clearance: 13.8 mL/min (by C-G formula based on SCr of 2.31 mg/dL (H)).  Sepsis Labs: @LABRCNTIP (procalcitonin:4,lacticidven:4) )No results found for this or any previous visit (from the past 240 hour(s)).   Radiological Exams on Admission: Dg Chest 2 View  Result Date: 09/24/2016 CLINICAL DATA:  83 y/o F; left-sided chest pain radiating down the left arm. EXAM: CHEST  2 VIEW COMPARISON:  09/08/2016 chest radiograph FINDINGS: Stable moderate cardiomegaly. No focal consolidation. No pleural effusion. Right shoulder rotator cuff repair changes. No acute osseous abnormality identified. IMPRESSION: Stable cardiomegaly.  No focal consolidation or effusion. Electronically Signed   By: Kristine Garbe M.D.   On: 09/24/2016 05:23    EKG: Independently reviewed. Sinus rhythm Left bundle branch block Baseline wander in lead(s) II  Assessment/Plan Principal Problem:   Chest pain Active Problems:   HYPERCHOLESTEROLEMIA   Essential  hypertension   Hypertensive cardiomyopathy (HCC)   PAROXYSMAL ATRIAL FIBRILLATION   CKD (chronic kidney disease), stage IV (HCC)   Diabetes  mellitus type 2 in nonobese (HCC)   Elevated troponin   Chronic systolic heart failure (HCC)   History of pulmonary embolism   #1. Chest pain/elevated troponin. Atypical. Heart score 5. Pain free on admission. Initial troponin 0.63 which is trending down since 10/17 EKG SR LBBB. bnp elevated but lower than last week when hospitalized for acute on chronic HF. D dimer elevated.  -Admit to telemetry -Cycle troponins -Serial EKG -Lipid panel -Continue aspirin -statin allergy -prn morphine and ntg -request cards consult -Of note patient with history non-STEMI in October. Reportedly no cath at that time due to chronic kidney disease.  #2. Chronic systolic heart failure. Appears compensated. BNP 1006.2.  Echo done one week ago revealed an EF of 15%. recent admission for acute on chronic heart failure. Underwent diuresis. Weight today 120 pounds. Coreg reduced and Entresto added.  -Monitor daily weight -Obtain intake and output -Continue home meds  #3. Chronic kidney disease stage IV. Creatinine 2.5. Chart review this indicates close to baseline. -Continue home meds -Monitor urine output  #4. A. Fib.chadvasc score 4. On Coumadin in the past but discontinued due to risk. EKG as noted above. -Continue home meds -Monitor  #5. Hypertension. Fair control in the emergency department. Home medications include amlodipine, Coreg, Lasix, hydralazine,imdur -continue home meds  6. Diabetes. On oral agents. Serum glucose 258. -We'll hold oral agents for now as her appetite is unpredictable -Hemoglobin A1c -sliding scale insulin for optimal control  #7. History of PE. Was on coumadin and discontinued due to risk. Currently elevated d dimer. No hypoxia. HR 89.  vq scan pending.  -follow scan -scds -heparin sq   DVT prophylaxis: heparin  Code Status: full   Family Communication: none present  Disposition Plan: home  Consults called: cardmaster  Admission status: obs    Dyanne Carrel M MD [facility]  If 7PM-7AM, please contact night-coverage [email or web address] Password [facility]  09/24/2016, 8:13 AM

## 2016-09-24 NOTE — Progress Notes (Signed)
09/24/2016 1520 Received pt to 2W17 from ED with dx of CP.  Pt is A&O, no c/o pain at this time.  Tele monitor applied and CCMD notified.  Oriented to room, call light and bed.  Call bell in reach, family at bedside. [place]

## 2016-09-24 NOTE — ED Notes (Signed)
Pt assisted to bedside commode w/ standby assist.

## 2016-09-24 NOTE — ED Provider Notes (Signed)
[facility] DEPT Provider Note   CSN: [id number] Arrival date & time: 09/24/16  0357     History   Chief Complaint Chief Complaint  Patient presents with  . Chest Pain    HPI [name] is a 83 y.o. female.  HPI  83 year old female with a history of CHF, diabetes, hypercholesterolemia, hypertension, nonischemic cardiomyopathy presents with chest pain since around 2 AM. She was already awake and walking around when she felt sudden left-sided pain. It is left lateral chest and radiates down her left arm. No diaphoresis, short of breath, nausea/vomiting. It feels like an aching sensation. She took 3 nitroglycerin and EMS gave her 324 mg aspirin and one nitroglycerin. She felt better temporarily but now feels like that medicine is wearing off and she's feeling worse. The pain is not sharp or worsened with inspiration or movement. She was discharged from the hospital about one week ago for CHF exacerbation and since then has not had any leg swelling.  Past Medical History:  Diagnosis Date  . Anxiety   . Benign neoplasm of adrenal gland   . CAD (coronary artery disease)    a. nonobstructive disease by cath in 2010. b. NSTEMI 05/2016: cath not performed secondary to Stage 4 CKD  . Calculus of kidney   . CHF (congestive heart failure) ([place])   . Diabetes mellitus    hx noncompliance with rx'd tx  . Diverticulosis of colon (without mention of hemorrhage)   . DJD (degenerative joint disease)   . GERD (gastroesophageal reflux disease)    hx DU with H pylori 01/2009 and GIB, s/p triple rx  . GI bleed 2012   duoedonal ulcer  . H. pylori infection   . Hx of subarachnoid hemorrhage 2001  . Hypercholesterolemia   . Hypertension   . LBBB (left bundle branch block)   . Nonischemic cardiomyopathy ([place])    hypertensive  . Nontoxic multinodular goiter   . Paroxysmal atrial fibrillation (HCC)    not anticoag candidate  . Pulmonary embolism ([place]) 02/2009   on anticoag thru 11/2010 -  stopped due to risk>benefit  . Pure hypercholesterolemia   . Tubulovillous adenoma of colon 2012    Patient Active Problem List   Diagnosis Date Noted  . SOB (shortness of breath) 09/08/2016  . Bilateral leg edema 09/08/2016  . Acute on chronic systolic congestive heart failure ([place]) 09/08/2016  . Symptomatic bradycardia 05/31/2016  . Thrombocytopenia ([place]) 05/31/2016  . Neuropathy ([place]) 04/30/2016  . Diabetes mellitus type 2 in nonobese ([place]) 12/25/2015  . PAROXYSMAL ATRIAL FIBRILLATION 09/30/2009  . CKD (chronic kidney disease), stage IV ([place]) 08/05/2009  . PULMONARY EMBOLISM 07/03/2009  . GOITER, MULTINODULAR 05/11/2009  . PEPTIC ULCER DISEASE, HELICOBACTER PYLORI POSITIVE 03/11/2009  . Coronary atherosclerosis 11/29/2008  . Hypertensive cardiomyopathy ([place]) 11/12/2008  . BENIGN NEOPLASM OF ADRENAL GLAND 06/20/2008  . DIVERTICULOSIS OF COLON 06/20/2008  . ARTERIOVENOUS MALFORMATION 06/20/2008  . HYPERCHOLESTEROLEMIA 05/10/2008  . Essential hypertension 05/10/2008  . TRANSIENT ISCHEMIC ATTACK 05/10/2008  . NEPHROLITHIASIS 05/10/2008    Past Surgical History:  Procedure Laterality Date  . CATARACT EXTRACTION    . KNEE SURGERY     left  . SHOULDER SURGERY     right  . TONSILLECTOMY      OB History    No data available       Home Medications    Prior to Admission medications   Medication Sig Start Date End Date Taking? Authorizing Provider  amLODipine (NORVASC) 10 MG tablet  Take 10 mg by mouth at bedtime.    Historical Provider, MD  Ascorbic Acid (VITAMIN C) 1000 MG tablet Take 1,000 mg by mouth daily as needed (FOR IMMUNE).    Historical Provider, MD  aspirin EC 81 MG tablet Take 1 tablet (81 mg total) by mouth daily. 06/15/16   Binnie Rail, MD  calcitRIOL (ROCALTROL) 0.25 MCG capsule Take 0.25 mcg by mouth daily. 05/07/16   Historical Provider, MD  carvedilol (COREG) 6.25 MG tablet Take 1 tablet (6.25 mg total) by mouth 2 (two) times daily with a meal. 09/11/16    Cheryln Manly, NP  furosemide (LASIX) 20 MG tablet Take 2 tablets (40 mg total) by mouth daily. 09/11/16   Cheryln Manly, NP  hydrALAZINE (APRESOLINE) 25 MG tablet Take 1 tablet (25 mg total) by mouth 4 (four) times daily. 09/11/16   Cheryln Manly, NP  isosorbide mononitrate (IMDUR) 30 MG 24 hr tablet Take 1 tablet (30 mg total) by mouth daily. 06/15/16   Binnie Rail, MD  nitroGLYCERIN (NITROSTAT) 0.4 MG SL tablet Place 1 tablet (0.4 mg total) under the tongue every 5 (five) minutes as needed for chest pain. 06/15/16   Binnie Rail, MD  repaglinide (PRANDIN) 2 MG tablet TAKE 1 TABLET (2 MG TOTAL) BY MOUTH 3 (THREE) TIMES DAILY BEFORE MEALS. Patient taking differently: TAKE 1 TABLET (2 MG TOTAL) BY MOUTH 2 TIMES DAILY BEFORE MEALS. 08/29/16   Renato Shin, MD  sacubitril-valsartan (ENTRESTO) 24-26 MG Take 1 tablet by mouth 2 (two) times daily. 09/11/16   Cheryln Manly, NP    Family History Family History  Problem Relation Age of Onset  . Lung cancer Mother   . Brain cancer Mother   . Goiter Mother   . Cancer Mother   . Heart disease Mother   . Diabetes Mother   . Diabetes Maternal Grandmother   . Heart disease Maternal Aunt     x2  . Colon cancer Neg Hx     Social History Social History  Substance Use Topics  . Smoking status: Former Smoker    Packs/day: 0.30    Years: 36.00    Types: Cigarettes    Quit date: 08/18/1983  . Smokeless tobacco: Never Used  . Alcohol use 0.6 oz/week    1 Glasses of wine per week     Comment: occ wine, beer during holidays or at social events     Allergies   Statins; Lisinopril; and Penicillins   Review of Systems Review of Systems  Respiratory: Negative for shortness of breath.   Cardiovascular: Positive for chest pain. Negative for leg swelling.  Gastrointestinal: Negative for abdominal pain, nausea and vomiting.  All other systems reviewed and are negative.    Physical Exam Updated Vital Signs BP (!) 164/105 (BP  Location: Right Arm)   Pulse (!) 129   Temp 98.2 F (36.8 C) (Oral)   Resp 20   Ht 5\' 2"  (1.575 m)   Wt 120 lb (54.4 kg)   SpO2 92%   BMI 21.95 kg/m   Physical Exam  Constitutional: She is oriented to person, place, and time. She appears well-developed and well-nourished. She appears distressed (appears in pain).  HENT:  Head: Normocephalic and atraumatic.  Right Ear: External ear normal.  Left Ear: External ear normal.  Nose: Nose normal.  Eyes: Right eye exhibits no discharge. Left eye exhibits no discharge.  Cardiovascular: Regular rhythm and normal heart sounds.  Tachycardia present.   Pulses:  Radial pulses are 2+ on the right side, and 2+ on the left side.  Pulmonary/Chest: Effort normal and breath sounds normal. She exhibits no tenderness.  No rash or obvious zoster  Abdominal: Soft. There is no tenderness.  Neurological: She is alert and oriented to person, place, and time.  Skin: Skin is warm and dry.  Nursing note and vitals reviewed.    ED Treatments / Results  Labs (all labs ordered are listed, but only abnormal results are displayed) Labs Reviewed  BASIC METABOLIC PANEL - Abnormal; Notable for the following:       Result Value   Glucose, Bld 258 (*)    BUN 32 (*)    Creatinine, Ser 2.31 (*)    GFR calc non Af Amer 18 (*)    GFR calc Af Amer 21 (*)    All other components within normal limits  BRAIN NATRIURETIC PEPTIDE - Abnormal; Notable for the following:    B Natriuretic Peptide 1,016.2 (*)    All other components within normal limits  D-DIMER, QUANTITATIVE (NOT AT [facility]) - Abnormal; Notable for the following:    D-Dimer, Quant 1.18 (*)    All other components within normal limits  TROPONIN I - Abnormal; Notable for the following:    Troponin I 0.63 (*)    All other components within normal limits  TROPONIN I - Abnormal; Notable for the following:    Troponin I 0.89 (*)    All other components within normal limits  I-STAT TROPOININ, ED -  Abnormal; Notable for the following:    Troponin i, poc 0.09 (*)    All other components within normal limits  CBC  TROPONIN I  TROPONIN I    EKG  EKG Interpretation  Date/Time:  Thursday September 24 2016 03:58:36 EST Ventricular Rate:  127 PR Interval:  134 QRS Duration: 150 QT Interval:  310 QTC Calculation: 450 R Axis:   -66 Text Interpretation:  Sinus tachycardia Possible Left atrial enlargement Left axis deviation Left bundle branch block Abnormal ECG rate is faster, otherwise no significant change from Sep 08 2016 Confirmed by Regenia Skeeter MD, [place] [phone]) on 09/24/2016 4:01:57 AM       EKG Interpretation  Date/Time:  Thursday September 24 2016 05:35:01 EST Ventricular Rate:  81 PR Interval:  134 QRS Duration: 161 QT Interval:  440 QTC Calculation: 511 R Axis:   -43 Text Interpretation:  Sinus rhythm Left bundle branch block Baseline wander in lead(s) II tachycardia resolved, otherwise similar to earlier in the day Confirmed by Anaisa Radi MD, Alayja Armas [phone]) on 09/24/2016 9:03:55 AM        Radiology Dg Chest 2 View  Result Date: 09/24/2016 CLINICAL DATA:  83 y/o F; left-sided chest pain radiating down the left arm. EXAM: CHEST  2 VIEW COMPARISON:  09/08/2016 chest radiograph FINDINGS: Stable moderate cardiomegaly. No focal consolidation. No pleural effusion. Right shoulder rotator cuff repair changes. No acute osseous abnormality identified. IMPRESSION: Stable cardiomegaly.  No focal consolidation or effusion. Electronically Signed   By: Kristine Garbe M.D.   On: 09/24/2016 05:23    Procedures Procedures (including critical care time)  Medications Ordered in ED Medications  nitroGLYCERIN (NITROSTAT) SL tablet 0.4 mg (not administered)  carvedilol (COREG) tablet 6.25 mg (not administered)  furosemide (LASIX) tablet 40 mg (not administered)  hydrALAZINE (APRESOLINE) tablet 25 mg (not administered)  aspirin EC tablet 81 mg (not administered)  isosorbide mononitrate  (IMDUR) 24 hr tablet 30 mg (not administered)  nitroGLYCERIN (NITROSTAT) SL tablet  0.4 mg (not administered)  calcitRIOL (ROCALTROL) capsule 0.25 mcg (not administered)  vitamin C (ASCORBIC ACID) tablet 1,000 mg (not administered)  amLODipine (NORVASC) tablet 10 mg (not administered)  acetaminophen (TYLENOL) tablet 650 mg (not administered)  ondansetron (ZOFRAN) injection 4 mg (not administered)  heparin injection 5,000 Units (not administered)  morphine 4 MG/ML injection 2 mg (not administered)  gi cocktail (Maalox,Lidocaine,Donnatal) (not administered)  gi cocktail (Maalox,Lidocaine,Donnatal) (not administered)  sacubitril-valsartan (ENTRESTO) 24-26 mg per tablet (not administered)  morphine 4 MG/ML injection 4 mg (4 mg Intravenous Given 09/24/16 0418)     Initial Impression / Assessment and Plan / ED Course  I have reviewed the triage vital signs and the nursing notes.  Pertinent labs & imaging results that were available during my care of the patient were reviewed by me and considered in my medical decision making (see chart for details).     Patient presents with atypical CP. Given degree of pain and only partial relief with nitro, morphine given with complete resolution. Mild troponin elevation. Given tachycardia, ddimer sent and is positive, will need VQ given CKD. Troponin could be cardiomyopathy, ACS, or PE related. I think dissection is less likely. No rash to suggest zoster. VQ ordered, cards consulted and recommends hospitalist admit. Hospitalist to admit.  Final Clinical Impressions(s) / ED Diagnoses   Final diagnoses:  Nonspecific chest pain    New Prescriptions New Prescriptions   No medications on file     Sherwood Gambler, MD 09/24/16 [phone]

## 2016-09-24 NOTE — Consult Note (Signed)
Cardiology Consult    Patient ID: [name] MRN: [id number], DOB/AGE: [birth year]   Admit date: 09/24/2016 Date of Consult: 09/24/2016  Primary Physician: Binnie Rail, MD Primary Cardiologist: Dr. Percival Spanish Requesting Provider: Dr. Marily Memos  Patient Profile    83 y/o woman with a history of systolic CHF believed to be 2/2 hypertensive cardiomyopathy with recent admission for exacerbation in January, nonobstructive CAD per 2010 cath, paroxysmal afib, NSTEMI in 05/2016, CKD stage 4, GERD, previous PE 2012,  and type 2 DM presented to the ED for chest pain with radiation starting around 2AM that continued until treatment with morphine in ED and elevated troponin at 0.63.  Past Medical History   Past Medical History:  Diagnosis Date  . Anxiety   . Benign neoplasm of adrenal gland   . CAD (coronary artery disease)    a. nonobstructive disease by cath in 2010. b. NSTEMI 05/2016: cath not performed secondary to Stage 4 CKD  . Calculus of kidney   . CHF (congestive heart failure) ([place])   . Diabetes mellitus    hx noncompliance with rx'd tx  . Diverticulosis of colon (without mention of hemorrhage)   . DJD (degenerative joint disease)   . GERD (gastroesophageal reflux disease)    hx DU with H pylori 01/2009 and GIB, s/p triple rx  . GI bleed 2012   duoedonal ulcer  . H. pylori infection   . Hx of subarachnoid hemorrhage 2001  . Hypercholesterolemia   . Hypertension   . LBBB (left bundle branch block)   . Nonischemic cardiomyopathy ([place])    hypertensive  . Nontoxic multinodular goiter   . Paroxysmal atrial fibrillation (HCC)    not anticoag candidate  . Pulmonary embolism ([place]) 02/2009   on anticoag thru 11/2010 - stopped due to risk>benefit  . Pure hypercholesterolemia   . Tubulovillous adenoma of colon 2012    Past Surgical History:  Procedure Laterality Date  . CATARACT EXTRACTION    . KNEE SURGERY     left  . SHOULDER SURGERY     right  . TONSILLECTOMY        Allergies  Allergies  Allergen Reactions  . Statins Other (See Comments)    Muscle Aches  . Lisinopril Other (See Comments) and Cough    REACTION: INTOL to ACE's w/ cough  . Penicillins Itching and Rash    History of Present Illness    [name] is a 83 y/o woman with a history of systolic CHF believed to be 2/2 hypertensive cardiomyopathy with recent admission for exacerbation in January, nonobstructive CAD per 2010 cath, paroxysmal afib, NSTEMI in 05/2016, CKD stage 4, GERD, previous PE 2012,  and type 2 DM. She was awake at home around 2AM this morning when she suddenly developed left sided chest pain. This pain started on her left chest but then started to radiate down the inside of her left arm. She denies dyspnea or nausea with this pain. She felt this pain was similar to her previous angina and took sublingual nitrates x3 doses over 15 minutes with partial improvement that did not last. She came to the ED for this and was tachycardic and hypertensive on arrival. She received a dose of IV morphine with resolution of her chest pain that has not returned. EKG showed initial tachycardia then normal sinus rhythm unchanged from past tracings. CXR was negative for any acute process. A V/Q scan was negative for an acute embolus. Troponins were mildly elevated with an increasing  trend 0.63->0.89->1.74 over the next 6 hours.  Inpatient Medications    . amLODipine  10 mg Oral QHS  . [START ON 09/25/2016] aspirin EC  81 mg Oral Daily  . calcitRIOL  0.25 mcg Oral Daily  . carvedilol  6.25 mg Oral BID WC  . furosemide  40 mg Oral Daily  . gi cocktail  30 mL Oral Once  . heparin  5,000 Units Subcutaneous Q8H  . hydrALAZINE  25 mg Oral QID  . isosorbide mononitrate  30 mg Oral Daily  . sacubitril-valsartan  1 tablet Oral BID     Outpatient Medications    Prior to Admission medications   Medication Sig Start Date End Date Taking? Authorizing Provider  amLODipine (NORVASC) 10 MG tablet  Take 10 mg by mouth at bedtime.   Yes Historical Provider, MD  Ascorbic Acid (VITAMIN C) 1000 MG tablet Take 1,000 mg by mouth daily as needed (FOR IMMUNE).   Yes Historical Provider, MD  aspirin EC 81 MG tablet Take 1 tablet (81 mg total) by mouth daily. 06/15/16  Yes Binnie Rail, MD  calcitRIOL (ROCALTROL) 0.25 MCG capsule Take 0.25 mcg by mouth daily. 05/07/16  Yes Historical Provider, MD  carvedilol (COREG) 6.25 MG tablet Take 1 tablet (6.25 mg total) by mouth 2 (two) times daily with a meal. 09/11/16  Yes Cheryln Manly, NP  furosemide (LASIX) 20 MG tablet Take 2 tablets (40 mg total) by mouth daily. 09/11/16  Yes Cheryln Manly, NP  hydrALAZINE (APRESOLINE) 25 MG tablet Take 1 tablet (25 mg total) by mouth 4 (four) times daily. 09/11/16  Yes Cheryln Manly, NP  isosorbide mononitrate (IMDUR) 30 MG 24 hr tablet Take 1 tablet (30 mg total) by mouth daily. 06/15/16  Yes Binnie Rail, MD  nitroGLYCERIN (NITROSTAT) 0.4 MG SL tablet Place 1 tablet (0.4 mg total) under the tongue every 5 (five) minutes as needed for chest pain. 06/15/16  Yes Binnie Rail, MD  repaglinide (PRANDIN) 2 MG tablet TAKE 1 TABLET (2 MG TOTAL) BY MOUTH 3 (THREE) TIMES DAILY BEFORE MEALS. Patient taking differently: TAKE 1 TABLET (2 MG TOTAL) BY MOUTH 2 TIMES DAILY BEFORE MEALS. 08/29/16  Yes Renato Shin, MD  sacubitril-valsartan (ENTRESTO) 24-26 MG Take 1 tablet by mouth 2 (two) times daily. 09/11/16  Yes Cheryln Manly, NP     Family History    Family History  Problem Relation Age of Onset  . Lung cancer Mother   . Brain cancer Mother   . Goiter Mother   . Cancer Mother   . Heart disease Mother   . Diabetes Mother   . Diabetes Maternal Grandmother   . Heart disease Maternal Aunt     x2  . Colon cancer Neg Hx     Social History    Social History   Social History  . Marital status: Widowed    Spouse name: N/A  . Number of children: 1  . Years of education: N/A   Occupational History  .  retired Retired   Social History Main Topics  . Smoking status: Former Smoker    Packs/day: 0.30    Years: 36.00    Types: Cigarettes    Quit date: 08/18/1983  . Smokeless tobacco: Never Used  . Alcohol use 0.6 oz/week    1 Glasses of wine per week     Comment: occ wine, beer during holidays or at social events  . Drug use: No  . Sexual activity: Not  Currently   Other Topics Concern  . Not on file   Social History Narrative   Widow, 1 daughter     Review of Systems   General:  No chills, fever, night sweats Cardiovascular:  Chest pain,  no edema Dermatological: No rash Respiratory: No cough, dyspnea Urologic: No hematuria, dysuria Abdominal:   No nausea, vomiting, diarrhea, no hematochezia Neurologic:  No visual changes, weakness, or changes in mental status. All other systems reviewed and are otherwise negative except as noted above.  Physical Exam   Blood pressure 114/68, pulse 70, temperature 98.2 F (36.8 C), temperature source Oral, resp. rate 12, height 5\' 2"  (1.575 m), weight 120 lb (54.4 kg), SpO2 91 %.  General: Pleasant and energetic appearing for age woman in NAD Psych: Normal affect. Neuro: Alert and oriented X 3. Moves all extremities spontaneously. HEENT: Normal, dentures in place Neck: Supple without bruits or JVD. Lungs:  Resp regular and unlabored, CTA. Heart: RRR no s3, s4, or murmurs. Abdomen: Soft, non-tender, non-distended Extremities: No clubbing, cyanosis or edema. DP and radial pulses 2+ and equal bilaterally.  Labs    Troponin [facility] of Care Test)  Recent Labs  09/24/16 0415 09/24/16 0743 09/24/16 1032 09/24/16 1326  TROPONINI 0.63* 0.89* 1.74* 3.46*   Lab Results  Component Value Date   WBC 6.2 09/24/2016   HGB 13.5 09/24/2016   HCT 42.4 09/24/2016   MCV 88.0 09/24/2016   PLT 166 09/24/2016     Recent Labs Lab 09/24/16 0440  NA 141  K 4.2  CL 103  CO2 24  BUN 32*  CREATININE 2.31*  CALCIUM 9.5  GLUCOSE 258*   Lab  Results  Component Value Date   DDIMER 1.18 (H) 09/24/2016     Radiology Studies    Dg Chest 2 View  Result Date: 09/24/2016 CLINICAL DATA:  83 y/o F; left-sided chest pain radiating down the left arm. EXAM: CHEST  2 VIEW COMPARISON:  09/08/2016 chest radiograph FINDINGS: Stable moderate cardiomegaly. No focal consolidation. No pleural effusion. Right shoulder rotator cuff repair changes. No acute osseous abnormality identified. IMPRESSION: Stable cardiomegaly.  No focal consolidation or effusion.   Nm Pulmonary Perf And Vent  Result Date: 09/24/2016 CLINICAL DATA:  Left side chest pain.  EXAM: NUCLEAR MEDICINE VENTILATION - PERFUSION LUNG SCAN IMPRESSION: No evidence of pulmonary embolus.   ECG & Cardiac Imaging    2/8 @0358  ECG: Sinus tachycardia with LBBB which is unchanged from previous baseline, no ST segment changes  2/8 @0535  ECG: Normal sinus rhythm with same LBBB as previously noted  Assessment & Plan    1. Chest pain/CAD: She has a history of hypertensive cardiomyopathy with diffusely reduced contractility on TTE 35% in 05/2016 and 15% during cHF exacerbation last month. Last cath 2010 without significant obstructions. NM perfusion study 07/02/15 indicated severely reduced EF without any inducible ischemia. Her pain is likely cardiac due to her history and similarity to past angina and especially her admission in October 2017 for NSTEMI. It is unclear what provoked this event as she was not exerting herself at the onset. She is a poor candidate for cardiac catheterization with CKD4/GFR of 21 which also prevented this evaluation in October. Other causes for pain are somewhat less likely with a benign chest xray. VQ scan was negative for embolus. She may have suffered a small MI and we will continue to see enzyme elevations over at least the next 24 hours. Troponin trend 0.63->0.89->1.74->3.46. -Cardiac telemetry -Continue following troponins at least  until peak or through  next day -Repeat EKG in AM -Continue ASA daily -Not tolerant of statins -Agree with PRN ntg and morphine -Imdur 30mg  -Plan to avoid catheterization unless she develops ST segment depression, hemodynamic instability, or persistent pain limiting return to performing ADLs  2. Systolic heart failure wth hypertensive cardiomyopathy/Hypertension: Blood pressure normal after her initial presentation with tachycardia and pain at the same time. Coreg dosing was kept the same in January due to some bradycardia on 12.5mg  BID. -Coreg 6.25mg  BID -Entresto 24/26 -amlodipine 10mg  -hydralazine 25mg  QID -Lasix 40mg  PO  3. Paroxysmal Afib: CHADsvasc score 4 off anticoagulation mostly due to history of hemodynamically significant GI bleeding. Not in afib per monitoring in the ED so far.  4. CKD stage 4: Close to baseline at present SCr 2.31.  5. T2DM: Not chronically on insulin. Management per primary team.  Appreciate the excellent care under current plan by Dr. Baker Janus team. We will continue to follow this patient's clinical course in case intervention becomes needed more urgently.  Collier Salina, MD PGY-II Internal Medicine Resident Pager# [phone] 09/24/2016, 2:39 PM  Attending Note:   The patient was seen and examined.  Agree with assessment and plan as noted above.  Changes made to the above note as needed.  Patient seen and independently examined with Ignacia Marvel, MD - PGY-2.   We discussed all aspects of the encounter. I agree with the assessment and plan as stated above.  1. Coronary artery disease: Patient has a history of coronary artery disease. She had a non-ST segment elevation myocardial infarction in October, 2017: She was not cathed  because of her severe chronic kidney disease.  She now presents with an episode of chest discomfort.    At this point I would be very has to proceed with cardiac catheterization unless she has hemodynamically significant angina symptoms. With  this degree of renal dysfunction, her risk of needing dialysis is fairly high. In talking with her daughter, she has episodes of angina when she walks around. She may be becoming tachycardic. We will increase the carvedilol to 12.5 mg twice a day.  I anticipate that she'll need to be here for a couple of days. We will continue to trend her troponin levels.   I do not anticipate a cardiac catheterization in that she becomes hemodynamically compromised. As such, I do not think that a Myoview study is necessary.     I have spent a total of 40 minutes with patient reviewing hospital  notes , telemetry, EKGs, labs and examining patient as well as establishing an assessment and plan that was discussed with the patient. > 50% of time was spent in direct patient care.    Thayer Headings, Brooke Bonito., MD, [facility] 09/24/2016, 2:41 PM [address]  [place] Pager [phone]

## 2016-09-25 ENCOUNTER — Observation Stay (HOSPITAL_BASED_OUTPATIENT_CLINIC_OR_DEPARTMENT_OTHER): Payer: Medicare Other

## 2016-09-25 ENCOUNTER — Other Ambulatory Visit (HOSPITAL_COMMUNITY): Payer: Medicare Other

## 2016-09-25 DIAGNOSIS — R791 Abnormal coagulation profile: Secondary | ICD-10-CM | POA: Diagnosis present

## 2016-09-25 DIAGNOSIS — I214 Non-ST elevation (NSTEMI) myocardial infarction: Secondary | ICD-10-CM | POA: Diagnosis present

## 2016-09-25 DIAGNOSIS — Z87891 Personal history of nicotine dependence: Secondary | ICD-10-CM | POA: Diagnosis not present

## 2016-09-25 DIAGNOSIS — N184 Chronic kidney disease, stage 4 (severe): Secondary | ICD-10-CM

## 2016-09-25 DIAGNOSIS — Z8249 Family history of ischemic heart disease and other diseases of the circulatory system: Secondary | ICD-10-CM | POA: Diagnosis not present

## 2016-09-25 DIAGNOSIS — R748 Abnormal levels of other serum enzymes: Secondary | ICD-10-CM

## 2016-09-25 DIAGNOSIS — I5022 Chronic systolic (congestive) heart failure: Secondary | ICD-10-CM

## 2016-09-25 DIAGNOSIS — I43 Cardiomyopathy in diseases classified elsewhere: Secondary | ICD-10-CM

## 2016-09-25 DIAGNOSIS — I10 Essential (primary) hypertension: Secondary | ICD-10-CM

## 2016-09-25 DIAGNOSIS — R079 Chest pain, unspecified: Secondary | ICD-10-CM | POA: Diagnosis not present

## 2016-09-25 DIAGNOSIS — I2510 Atherosclerotic heart disease of native coronary artery without angina pectoris: Secondary | ICD-10-CM

## 2016-09-25 DIAGNOSIS — I200 Unstable angina: Secondary | ICD-10-CM

## 2016-09-25 DIAGNOSIS — I110 Hypertensive heart disease with heart failure: Secondary | ICD-10-CM

## 2016-09-25 DIAGNOSIS — I252 Old myocardial infarction: Secondary | ICD-10-CM | POA: Diagnosis not present

## 2016-09-25 DIAGNOSIS — Z86711 Personal history of pulmonary embolism: Secondary | ICD-10-CM

## 2016-09-25 DIAGNOSIS — I130 Hypertensive heart and chronic kidney disease with heart failure and stage 1 through stage 4 chronic kidney disease, or unspecified chronic kidney disease: Secondary | ICD-10-CM | POA: Diagnosis present

## 2016-09-25 DIAGNOSIS — Z79899 Other long term (current) drug therapy: Secondary | ICD-10-CM | POA: Diagnosis not present

## 2016-09-25 DIAGNOSIS — E7800 Pure hypercholesterolemia, unspecified: Secondary | ICD-10-CM

## 2016-09-25 DIAGNOSIS — I447 Left bundle-branch block, unspecified: Secondary | ICD-10-CM | POA: Diagnosis present

## 2016-09-25 DIAGNOSIS — Z7982 Long term (current) use of aspirin: Secondary | ICD-10-CM | POA: Diagnosis not present

## 2016-09-25 DIAGNOSIS — Z9849 Cataract extraction status, unspecified eye: Secondary | ICD-10-CM | POA: Diagnosis not present

## 2016-09-25 DIAGNOSIS — E119 Type 2 diabetes mellitus without complications: Secondary | ICD-10-CM

## 2016-09-25 DIAGNOSIS — I480 Paroxysmal atrial fibrillation: Secondary | ICD-10-CM

## 2016-09-25 DIAGNOSIS — R000 Tachycardia, unspecified: Secondary | ICD-10-CM | POA: Diagnosis present

## 2016-09-25 DIAGNOSIS — Z880 Allergy status to penicillin: Secondary | ICD-10-CM | POA: Diagnosis not present

## 2016-09-25 DIAGNOSIS — I5042 Chronic combined systolic (congestive) and diastolic (congestive) heart failure: Secondary | ICD-10-CM | POA: Diagnosis not present

## 2016-09-25 DIAGNOSIS — K219 Gastro-esophageal reflux disease without esophagitis: Secondary | ICD-10-CM | POA: Diagnosis present

## 2016-09-25 DIAGNOSIS — E1122 Type 2 diabetes mellitus with diabetic chronic kidney disease: Secondary | ICD-10-CM | POA: Diagnosis not present

## 2016-09-25 DIAGNOSIS — Z888 Allergy status to other drugs, medicaments and biological substances status: Secondary | ICD-10-CM | POA: Diagnosis not present

## 2016-09-25 LAB — GLUCOSE, CAPILLARY
GLUCOSE-CAPILLARY: 115 mg/dL — AB (ref 65–99)
Glucose-Capillary: 217 mg/dL — ABNORMAL HIGH (ref 65–99)
Glucose-Capillary: 150 mg/dL — ABNORMAL HIGH (ref 65–99)

## 2016-09-25 LAB — ECHOCARDIOGRAM COMPLETE
Height: 62 in
WEIGHTICAEL: 1988.8 [oz_av]

## 2016-09-25 LAB — TROPONIN I
Troponin I: 17.45 ng/mL (ref <0.03)
Troponin I: 26.85 ng/mL (ref <0.03)

## 2016-09-25 MED ORDER — INSULIN ASPART 100 UNIT/ML ~~LOC~~ SOLN: 0.0000 [IU] | Freq: Every day | SUBCUTANEOUS | Status: DC

## 2016-09-25 MED ORDER — INSULIN ASPART 100 UNIT/ML ~~LOC~~ SOLN
0.0000 [IU] | Freq: Three times a day (TID) | SUBCUTANEOUS | Status: DC
  Administered 2016-09-25: 3 [IU] via SUBCUTANEOUS
  Administered 2016-09-26: 1 [IU] via SUBCUTANEOUS
  Administered 2016-09-26: 2 [IU] via SUBCUTANEOUS
  Administered 2016-09-27: 1 [IU] via SUBCUTANEOUS

## 2016-09-25 MED ORDER — PERFLUTREN LIPID MICROSPHERE
1.0000 mL | INTRAVENOUS | Status: AC | PRN
  Administered 2016-09-25: 2 mL via INTRAVENOUS
  Filled 2016-09-25: qty 10

## 2016-09-25 MED ORDER — INSULIN ASPART 100 UNIT/ML ~~LOC~~ SOLN
3.0000 [IU] | Freq: Three times a day (TID) | SUBCUTANEOUS | Status: DC
  Administered 2016-09-26 – 2016-09-27 (×3): 3 [IU] via SUBCUTANEOUS

## 2016-09-25 MED ORDER — INSULIN ASPART 100 UNIT/ML ~~LOC~~ SOLN: 3.0000 [IU] | Freq: Three times a day (TID) | SUBCUTANEOUS | Status: DC

## 2016-09-25 NOTE — Progress Notes (Signed)
   Called with increasing troponin.  The patient is resting comfortably and has had no chest pain.  She is being managed conservatively because of her CKD.  I will repeat an EKG.

## 2016-09-25 NOTE — Progress Notes (Addendum)
Progress Note    [name]  [id number] DOB: 1927/08/28  DOA: 09/24/2016 PCP: Binnie Rail, MD    Brief Narrative:   Chief complaint: Follow-up chest pain.  [name] is an 83 y.o. female with a history of systolic CHF believed to be 2/2 hypertensive cardiomyopathy with recent admission for exacerbation in January, nonobstructive CAD per 2010 cath, paroxysmal afib, NSTEMI in 05/2016, CKD stage 4, GERD, previous PE 2012,  and type 2 DM who was admitted 09/24/16 with chest pain and NSTEMI.  Assessment/Plan:   Principal Problem:   Chest pain secondary to non-ST elevation MI with elevated troponin Admission heart score was 5. VQ scan low probability for pulmonary embolism. Resolved after being given morphine in the ED. Chest pain was atypical in nature but was noted to have elevated troponin, which has steadily risen on serial evaluation. Has been seen by cardiology who recommends a conservative approach given her impaired renal function. Plan is to observe her while weekend, repeat an echocardiogram, and continue conservative management if patient's chest pain remains resolved.  Active Problems:   HYPERCHOLESTEROLEMIA Is allergic to statins. Heart healthy diet.    Essential hypertension Continue Norvasc, Coreg, Lasix, hydralazine, Entresto. Blood pressure well controlled.     Hypertensive cardiomyopathy (HCC)/chronic systolic CHF Echo done one week ago revealed an EF of 15%. recent admission for acute on chronic heart failure. Underwent diuresis. BNP elevated on admission but appears compensated clinically. Chest x-ray personally reviewed. Heart enlarged, but no frank failure appreciated.     PAROXYSMAL ATRIAL FIBRILLATION Not on blood thinners secondary to high fall risk. 12-lead EKG reviewed. Sinus rhythm with a left bundle branch block. Rate controlled.    CKD (chronic kidney disease), stage IV (HCC) Severe impairment of kidney function makes cardiac  catheterization very risky. Creatinine clearance is 13.8. Monitor renal function closely and avoid nephrotoxic medications if possible. Continue calcitriol.    Diabetes mellitus type 2 in nonobese [facility]) Oral hypoglycemics on hold. We'll start insulin sensitive SSI with 3 units of meal coverage. Her last hemoglobin A1c was 6.6%, approximately 2 months ago, so no need to repeat at this time.    History of pulmonary embolism VQ scan low probability.   Family Communication/Anticipated D/C date and plan/Code Status   DVT prophylaxis: Heparin ordered. Code Status: Full Code.  Family Communication: No family present at the bedside. Disposition Plan: Home in several days, per cardiology recommendations, monitor over the weekend.   Medical Consultants:    Cardiology   Procedures:    2-D echocardiogram  Anti-Infectives:    None  Subjective:   The patient reports that she feels better today. Chest pain resolved after being given morphine yesterday. Denies shortness of breath and dizziness. No nausea or vomiting. Feels anxious about her heart tests being abnormal.  Objective:    Vitals:   09/25/16 2123 09/26/16 0414 09/26/16 0609 09/26/16 1004  BP: 122/68 130/64 (!) 123/51 (!) 103/51  Pulse: 65 77 62 (!) 58  Resp: 18 18 18    Temp: 98 F (36.7 C) 98.5 F (36.9 C) 97.9 F (36.6 C)   TempSrc: Oral Oral Oral   SpO2: 98% 97% 96%   Weight:      Height:        Intake/Output Summary (Last 24 hours) at 09/26/16 1105 Last data filed at 09/25/16 2124  Gross per 24 hour  Intake              720 ml  Output             1001 ml  Net             -281 ml   Filed Weights   09/24/16 0405 09/24/16 0409 09/24/16 1536  Weight: 56.2 kg (124 lb) 54.4 kg (120 lb) 56.4 kg (124 lb 4.8 oz)    Exam: General exam: Appears mildly anxious but in no acute distress. Respiratory system: Clear to auscultation. Respiratory effort normal. Cardiovascular system: S1 & S2 heard, RRR. No JVD,  rubs,  gallops or clicks. No murmurs. Gastrointestinal system: Abdomen is nondistended, soft and nontender. No organomegaly or masses felt. Normal bowel sounds heard. Central nervous system: Alert and oriented. No focal neurological deficits. Extremities: No clubbing,  or cyanosis. No edema. Skin: No rashes, lesions or ulcers. Psychiatry: Judgement and insight appear normal. Mood & affect appropriate.   Data Reviewed:   I have personally reviewed following labs and imaging studies:  Labs: Basic Metabolic Panel:  Recent Labs Lab 09/24/16 0440 09/26/16 0402  NA 141 137  K 4.2 4.6  CL 103 99*  CO2 24 26  GLUCOSE 258* 143*  BUN 32* 42*  CREATININE 2.31* 2.67*  CALCIUM 9.5 9.3   GFR Estimated Creatinine Clearance: 12 mL/min (by C-G formula based on SCr of 2.67 mg/dL (H)). Liver Function Tests: No results for input(s): AST, ALT, ALKPHOS, BILITOT, PROT, ALBUMIN in the last 168 hours. No results for input(s): LIPASE, AMYLASE in the last 168 hours. No results for input(s): AMMONIA in the last 168 hours. Coagulation profile No results for input(s): INR, PROTIME in the last 168 hours.  CBC:  Recent Labs Lab 09/24/16 0440  WBC 6.2  HGB 13.5  HCT 42.4  MCV 88.0  PLT 166   Cardiac Enzymes:  Recent Labs Lab 09/24/16 1326 09/24/16 1849 09/25/16 0017 09/25/16 0630 09/26/16 0402  TROPONINI 3.46* 9.11* 17.45* 26.85* 26.85*   BNP (last 3 results)  Recent Labs  09/08/16 1417  PROBNP 2,251.0*   CBG:  Recent Labs Lab 09/25/16 1223 09/25/16 1716 09/25/16 2118 09/26/16 0607  GLUCAP 217* 115* 150* 140*   D-Dimer:  Recent Labs  09/24/16 0415  DDIMER 1.18*   Hgb A1c: No results for input(s): HGBA1C in the last 72 hours. Lipid Profile: No results for input(s): CHOL, HDL, LDLCALC, TRIG, CHOLHDL, LDLDIRECT in the last 72 hours. Thyroid function studies: No results for input(s): TSH, T4TOTAL, T3FREE, THYROIDAB in the last 72 hours.  Invalid input(s): FREET3 Anemia  work up: No results for input(s): VITAMINB12, FOLATE, FERRITIN, TIBC, IRON, RETICCTPCT in the last 72 hours. Sepsis Labs:  Recent Labs Lab 09/24/16 0440  WBC 6.2    Microbiology No results found for this or any previous visit (from the past 240 hour(s)).  Radiology: Nm Pulmonary Perf And Vent  Result Date: 09/24/2016 CLINICAL DATA:  Left side chest pain. EXAM: NUCLEAR MEDICINE VENTILATION - PERFUSION LUNG SCAN TECHNIQUE: Ventilation images were obtained in multiple projections using inhaled aerosol Tc-44m DTPA. Perfusion images were obtained in multiple projections after intravenous injection of Tc-72m MAA. RADIOPHARMACEUTICALS:  30.3 mCi Technetium-23m DTPA aerosol inhalation and 4.1 mCi Technetium-36m MAA IV COMPARISON:  Chest x-ray earlier today FINDINGS: Ventilation: Patchy nonsegmental ventilation defects, possibly related to obstructive lung disease Perfusion: No wedge shaped peripheral perfusion defects to suggest acute pulmonary embolism. IMPRESSION: No evidence of pulmonary embolus. Electronically Signed   By: Rolm Baptise M.D.   On: 09/24/2016 11:09    Medications:   . amLODipine  10  mg Oral QHS  . aspirin EC  81 mg Oral Daily  . calcitRIOL  0.25 mcg Oral Daily  . carvedilol  6.25 mg Oral BID WC  . furosemide  40 mg Oral Daily  . gi cocktail  30 mL Oral Once  . heparin  5,000 Units Subcutaneous Q8H  . hydrALAZINE  25 mg Oral QID  . insulin aspart  0-5 Units Subcutaneous QHS  . insulin aspart  0-9 Units Subcutaneous TID WC  . insulin aspart  3 Units Subcutaneous TID WC  . isosorbide mononitrate  30 mg Oral Daily  . sacubitril-valsartan  1 tablet Oral BID   Continuous Infusions:  Medical decision making is of high complexity and this patient is at high risk of deterioration, therefore this is a level 3 visit.  (> 4 problem points, >4 data points, high risk)   LOS: 1 day   RAMA,CHRISTINA  [facility] Pager [phone]. If unable to reach me by pager, please call  my cell phone at [phone].  *Please refer to [email or web address], password [email or web address] to get updated schedule on who will round on this patient, as hospitalists switch teams weekly. If 7PM-7AM, please contact night-coverage at [email or web address], password [email or web address] for any overnight needs.  09/26/2016, 11:05 AM

## 2016-09-25 NOTE — Progress Notes (Signed)
  Echocardiogram 2D Echocardiogram with Definity has been performed.  [name] 09/25/2016, 9:45 AM

## 2016-09-25 NOTE — Progress Notes (Signed)
Patient's last troponin 17.45.  Pt asymptomatic at this time, resting comfortably, denies any CP, VSS.  MD Hochrein paged and made aware.  RN will continue to monitor patient closely.  Claudette Stapler, RN

## 2016-09-25 NOTE — Progress Notes (Signed)
Progress Note  Patient Name: [name] Date of Encounter: 09/25/2016  Primary Cardiologist: Hochrein  Subjective   83 yo with mild CAD, HTN,CKD, pulmonary embolus, systolic CHF,  Admitted with CP / NSTEMI. troponins have continued to rise,   She is feeling well  Trop is now 17.45 Cr is 2.31   Inpatient Medications    Scheduled Meds: . amLODipine  10 mg Oral QHS  . aspirin EC  81 mg Oral Daily  . calcitRIOL  0.25 mcg Oral Daily  . carvedilol  6.25 mg Oral BID WC  . furosemide  40 mg Oral Daily  . gi cocktail  30 mL Oral Once  . heparin  5,000 Units Subcutaneous Q8H  . hydrALAZINE  25 mg Oral QID  . isosorbide mononitrate  30 mg Oral Daily  . sacubitril-valsartan  1 tablet Oral BID   Continuous Infusions:  PRN Meds: acetaminophen, gi cocktail, morphine injection, nitroGLYCERIN, ondansetron (ZOFRAN) IV, technetium TC 86M diethylenetriame-pentaacetic acid, vitamin C   Vital Signs    Vitals:   09/24/16 1737 09/24/16 2021 09/25/16 0153 09/25/16 0341  BP: 107/60 106/67 (!) 114/58 (!) 121/57  Pulse:  68 79 75  Resp:  18 18 18   Temp:  98.1 F (36.7 C)  99.3 F (37.4 C)  TempSrc:  Oral  Oral  SpO2:  98% 93% 95%  Weight:      Height:        Intake/Output Summary (Last 24 hours) at 09/25/16 0758 Last data filed at 09/24/16 1800  Gross per 24 hour  Intake              240 ml  Output              300 ml  Net              -60 ml   Filed Weights   09/24/16 0405 09/24/16 0409 09/24/16 1536  Weight: 124 lb (56.2 kg) 120 lb (54.4 kg) 124 lb 4.8 oz (56.4 kg)    Telemetry    NSR  - Personally Reviewed  ECG       Physical Exam   GEN: No acute distress.   Neck: No JVD Cardiac: RRR, no murmurs, rubs, or gallops.  Respiratory: Clear to auscultation bilaterally. GI: Soft, nontender, non-distended  MS: No edema; No deformity. Neuro:  Nonfocal  Psych: Normal affect   Labs    Chemistry Recent Labs Lab 09/24/16 0440  NA 141  K 4.2  CL 103  CO2  24  GLUCOSE 258*  BUN 32*  CREATININE 2.31*  CALCIUM 9.5  GFRNONAA 18*  GFRAA 21*  ANIONGAP 14     Hematology Recent Labs Lab 09/24/16 0440  WBC 6.2  RBC 4.82  HGB 13.5  HCT 42.4  MCV 88.0  MCH 28.0  MCHC 31.8  RDW 14.2  PLT 166    Cardiac Enzymes Recent Labs Lab 09/24/16 1032 09/24/16 1326 09/24/16 1849 09/25/16 0017  TROPONINI 1.74* 3.46* 9.11* 17.45*    Recent Labs Lab 09/24/16 0408  TROPIPOC 0.09*     BNP Recent Labs Lab 09/24/16 0405  BNP 1,016.2*     DDimer  Recent Labs Lab 09/24/16 0415  DDIMER 1.18*     Radiology    Dg Chest 2 View  Result Date: 09/24/2016 CLINICAL DATA:  83 y/o F; left-sided chest pain radiating down the left arm. EXAM: CHEST  2 VIEW COMPARISON:  09/08/2016 chest radiograph FINDINGS: Stable moderate cardiomegaly. No focal consolidation. No  pleural effusion. Right shoulder rotator cuff repair changes. No acute osseous abnormality identified. IMPRESSION: Stable cardiomegaly.  No focal consolidation or effusion. Electronically Signed   By: Kristine Garbe M.D.   On: 09/24/2016 05:23   Nm Pulmonary Perf And Vent  Result Date: 09/24/2016 CLINICAL DATA:  Left side chest pain. EXAM: NUCLEAR MEDICINE VENTILATION - PERFUSION LUNG SCAN TECHNIQUE: Ventilation images were obtained in multiple projections using inhaled aerosol Tc-19m DTPA. Perfusion images were obtained in multiple projections after intravenous injection of Tc-1m MAA. RADIOPHARMACEUTICALS:  30.3 mCi Technetium-47m DTPA aerosol inhalation and 4.1 mCi Technetium-60m MAA IV COMPARISON:  Chest x-ray earlier today FINDINGS: Ventilation: Patchy nonsegmental ventilation defects, possibly related to obstructive lung disease Perfusion: No wedge shaped peripheral perfusion defects to suggest acute pulmonary embolism. IMPRESSION: No evidence of pulmonary embolus. Electronically Signed   By: Rolm Baptise M.D.   On: 09/24/2016 11:09    Cardiac Studies     Patient Profile      83 y.o. female hx of CAD, CHF, CKD,   Assessment & Plan   1. NSTEMI:   Troponins continue to rise but she is feeling much better . As discussed yesterday ,  Will continue with a conservative approach and only consider cath if she has ongoing pain that we cannot relieve Will observe through the weekend   2. CHF   Repeat echo  3. CKD :   Repeat labs .    Signed, Mertie Moores, MD  09/25/2016, 7:58 AM

## 2016-09-26 DIAGNOSIS — R079 Chest pain, unspecified: Secondary | ICD-10-CM

## 2016-09-26 LAB — GLUCOSE, CAPILLARY
GLUCOSE-CAPILLARY: 140 mg/dL — AB (ref 65–99)
GLUCOSE-CAPILLARY: 180 mg/dL — AB (ref 65–99)
GLUCOSE-CAPILLARY: 73 mg/dL (ref 65–99)
GLUCOSE-CAPILLARY: 171 mg/dL — AB (ref 65–99)

## 2016-09-26 LAB — BASIC METABOLIC PANEL
ANION GAP: 12 (ref 5–15)
BUN: 42 mg/dL — AB (ref 6–20)
CO2: 26 mmol/L (ref 22–32)
Calcium: 9.3 mg/dL (ref 8.9–10.3)
Chloride: 99 mmol/L — ABNORMAL LOW (ref 101–111)
Creatinine, Ser: 2.67 mg/dL — ABNORMAL HIGH (ref 0.44–1.00)
GFR calc Af Amer: 18 mL/min — ABNORMAL LOW (ref >60)
GFR, EST NON AFRICAN AMERICAN: 15 mL/min — AB (ref >60)
Glucose, Bld: 143 mg/dL — ABNORMAL HIGH (ref 65–99)
POTASSIUM: 4.6 mmol/L (ref 3.5–5.1)
SODIUM: 137 mmol/L (ref 135–145)

## 2016-09-26 LAB — TROPONIN I: TROPONIN I: 26.85 ng/mL — AB (ref <0.03)

## 2016-09-26 NOTE — Progress Notes (Signed)
Progress Note  Patient Name: [name] Date of Encounter: 09/26/2016  Primary Cardiologist: Hochrein  Subjective   83 yo with mild CAD, HTN,CKD, pulmonary embolus, systolic CHF,  Admitted with CP / NSTEMI. troponins have continued to rise,   She is feeling well  Trop is now 26.85 Cr is 2.67  Echocardiogram reveals severe left ventricular dysfunction with an ejection fraction of between 20 and 25%. She has grade 1 diastolic dysfunction. Mild mitral regurgitation. Moderate pulmonary artery hypertension with an estimated PA pressure of 47. There is no pericardial effusion. Compared to her previous study in January, the left ventricle systolic function has improved slightly. Inpatient Medications    Scheduled Meds: . amLODipine  10 mg Oral QHS  . aspirin EC  81 mg Oral Daily  . calcitRIOL  0.25 mcg Oral Daily  . carvedilol  6.25 mg Oral BID WC  . furosemide  40 mg Oral Daily  . gi cocktail  30 mL Oral Once  . heparin  5,000 Units Subcutaneous Q8H  . hydrALAZINE  25 mg Oral QID  . insulin aspart  0-5 Units Subcutaneous QHS  . insulin aspart  0-9 Units Subcutaneous TID WC  . insulin aspart  3 Units Subcutaneous TID WC  . isosorbide mononitrate  30 mg Oral Daily  . sacubitril-valsartan  1 tablet Oral BID   Continuous Infusions:  PRN Meds: acetaminophen, gi cocktail, morphine injection, nitroGLYCERIN, ondansetron (ZOFRAN) IV, technetium TC 49M diethylenetriame-pentaacetic acid, vitamin C   Vital Signs    Vitals:   09/25/16 1300 09/25/16 2123 09/26/16 0414 09/26/16 0609  BP: (!) 126/58 122/68 130/64 (!) 123/51  Pulse: 70 65 77 62  Resp: 18 18 18 18   Temp: 98.4 F (36.9 C) 98 F (36.7 C) 98.5 F (36.9 C) 97.9 F (36.6 C)  TempSrc: Oral Oral Oral Oral  SpO2: 98% 98% 97% 96%  Weight:      Height:        Intake/Output Summary (Last 24 hours) at 09/26/16 0939 Last data filed at 09/25/16 2124  Gross per 24 hour  Intake              720 ml  Output              1301 ml  Net             -581 ml   Filed Weights   09/24/16 0405 09/24/16 0409 09/24/16 1536  Weight: 124 lb (56.2 kg) 120 lb (54.4 kg) 124 lb 4.8 oz (56.4 kg)    Telemetry    NSR  - Personally Reviewed  ECG       Physical Exam   GEN: No acute distress.   Neck: No JVD Cardiac: RRR, no murmurs, rubs, or gallops.  Respiratory: Clear to auscultation bilaterally. GI: Soft, nontender, non-distended  MS: No edema; No deformity. Neuro:  Nonfocal  Psych: Normal affect   Labs    Chemistry  Recent Labs Lab 09/24/16 0440 09/26/16 0402  NA 141 137  K 4.2 4.6  CL 103 99*  CO2 24 26  GLUCOSE 258* 143*  BUN 32* 42*  CREATININE 2.31* 2.67*  CALCIUM 9.5 9.3  GFRNONAA 18* 15*  GFRAA 21* 18*  ANIONGAP 14 12     Hematology  Recent Labs Lab 09/24/16 0440  WBC 6.2  RBC 4.82  HGB 13.5  HCT 42.4  MCV 88.0  MCH 28.0  MCHC 31.8  RDW 14.2  PLT 166    Cardiac Enzymes  Recent Labs Lab 09/24/16 1849 09/25/16 0017 09/25/16 0630 09/26/16 0402  TROPONINI 9.11* 17.45* 26.85* 26.85*     Recent Labs Lab 09/24/16 0408  TROPIPOC 0.09*     BNP  Recent Labs Lab 09/24/16 0405  BNP 1,016.2*     DDimer   Recent Labs Lab 09/24/16 0415  DDIMER 1.18*     Radiology    Nm Pulmonary Perf And Vent  Result Date: 09/24/2016 CLINICAL DATA:  Left side chest pain. EXAM: NUCLEAR MEDICINE VENTILATION - PERFUSION LUNG SCAN TECHNIQUE: Ventilation images were obtained in multiple projections using inhaled aerosol Tc-66m DTPA. Perfusion images were obtained in multiple projections after intravenous injection of Tc-70m MAA. RADIOPHARMACEUTICALS:  30.3 mCi Technetium-59m DTPA aerosol inhalation and 4.1 mCi Technetium-30m MAA IV COMPARISON:  Chest x-ray earlier today FINDINGS: Ventilation: Patchy nonsegmental ventilation defects, possibly related to obstructive lung disease Perfusion: No wedge shaped peripheral perfusion defects to suggest acute pulmonary embolism. IMPRESSION: No  evidence of pulmonary embolus. Electronically Signed   By: Rolm Baptise M.D.   On: 09/24/2016 11:09    Cardiac Studies     Patient Profile     83 y.o. female hx of CAD, CHF, CKD,   Assessment & Plan   1. NSTEMI:   Troponins continue to rise but she is feeling much better . As discussed yesterday ,  Will continue with a conservative approach and only consider cath if she has ongoing pain that we cannot relieve Will observe through the weekend    2. CHF   Her echocardiogram reveals severe left ventricular dysfunction but actually is improved slightly compared to the previous echo in January.  3. CKD :  Her creatinine has increased slightly. Further evaluation and management per [facility]  internal medicine team.   Signed, Mertie Moores, MD  09/26/2016, 9:39 AM

## 2016-09-26 NOTE — Progress Notes (Addendum)
Progress Note    [name]  [id number] DOB: [birth year]  DOA: 09/24/2016 PCP: Binnie Rail, MD    Brief Narrative:   Chief complaint: Follow-up chest pain.  [name] is an 83 y.o. female with a history of systolic CHF believed to be 2/2 hypertensive cardiomyopathy with recent admission for exacerbation in January, nonobstructive CAD per 2010 cath, paroxysmal afib, NSTEMI in 05/2016, CKD stage 4, GERD, previous PE 2012,  and type 2 DM who was admitted 09/24/16 with chest pain and NSTEMI.  Assessment/Plan:   Principal Problem:   Chest pain secondary to non-ST elevation MI with elevated troponin Admission heart score was 5. VQ scan low probability for pulmonary embolism. Resolved after being given morphine in the ED. Chest pain was atypical in nature but was noted to have elevated troponin, which has steadily risen on serial evaluation. Has been seen by cardiology who recommends a conservative approach given her impaired renal function. 2-D echo done 09/25/16: EF 20-25 percent with diffuse hypokinesis and grade 1 diastolic dysfunction. Continue to monitor closely on telemetry.  Active Problems:   HYPERCHOLESTEROLEMIA Is allergic to statins. Heart healthy diet.    Essential hypertension Continue Norvasc, Coreg, Lasix, hydralazine, Entresto. Blood pressure well controlled.     Hypertensive cardiomyopathy (HCC)/chronic systolic and diastolic CHF Echo done one week ago revealed an EF of 15%. recent admission for acute on chronic heart failure. Underwent diuresis. BNP elevated on admission but appears compensated clinically. Chest x-ray personally reviewed. Heart enlarged, but no frank failure appreciated. Repeat echocardiogram showed EF 20-25 percent with grade 1 diastolic dysfunction, slightly improved EF.     PAROXYSMAL ATRIAL FIBRILLATION Not on blood thinners secondary to high fall risk. 12-lead EKG reviewed. Sinus rhythm with a left bundle branch block. Rate  controlled.    CKD (chronic kidney disease), stage IV (HCC) Severe impairment of kidney function makes cardiac catheterization very risky. Creatinine clearance is 13.8. Monitor renal function closely and avoid nephrotoxic medications if possible. Continue calcitriol. Hold Lasix for now. Patient advised that if she has shortness of breath, to have the nurse notify me so we can resume.    Diabetes mellitus type 2 in nonobese [place]) Oral hypoglycemics on hold. Her last hemoglobin A1c was 6.6%, approximately 2 months ago, so no need to repeat at this time. Currently being managed with insulin sensitive SSI and 3 units of meal coverage. CBGs 115-217.    History of pulmonary embolism/moderate pulmonary hypertension VQ scan low probability.   Family Communication/Anticipated D/C date and plan/Code Status   DVT prophylaxis: Heparin ordered. Code Status: Full Code.  Family Communication: No family present at the bedside. Disposition Plan: Home 09/28/16 if stable and no need for invasive evaluation.   Medical Consultants:    Cardiology   Procedures:    2-D echocardiogram  Study Conclusions  - Left ventricle: The cavity size was normal. There was moderate   concentric hypertrophy. Systolic function was severely reduced.   The estimated ejection fraction was in the range of 20% to 25%.   Diffuse hypokinesis. Doppler parameters are consistent with   abnormal left ventricular relaxation (grade 1 diastolic   dysfunction). Doppler parameters are consistent with elevated   ventricular end-diastolic filling pressure. - Aortic valve: Trileaflet; mildly thickened, mildly calcified   leaflets. Sclerosis without stenosis. - Mitral valve: There was mild regurgitation. - Left atrium: The atrium was at the upper limits of normal in   size. - Right ventricle: Systolic function was normal. -  Tricuspid valve: There was moderate regurgitation. - Pulmonic valve: There was trivial regurgitation. -  Pulmonary arteries: Systolic pressure was moderately increased.   PA peak pressure: 47 mm Hg (S). - Pericardium, extracardiac: There was no pericardial effusion.  Impressions:  - When compared to the prior study from 09/09/2016 LVEF has mildly   improved from 15 to 20-25%/   Pulmonary hypertension remains in moderate range.  Anti-Infectives:    None  Subjective:   Denies any chest pain or shortness of breath. Says her energy level is about normal. No nausea or vomiting.  Objective:    Vitals:   09/25/16 1300 09/25/16 2123 09/26/16 0414 09/26/16 0609  BP: (!) 126/58 122/68 130/64 (!) 123/51  Pulse: 70 65 77 62  Resp: 18 18 18 18   Temp: 98.4 F (36.9 C) 98 F (36.7 C) 98.5 F (36.9 C) 97.9 F (36.6 C)  TempSrc: Oral Oral Oral Oral  SpO2: 98% 98% 97% 96%  Weight:      Height:        Intake/Output Summary (Last 24 hours) at 09/26/16 0809 Last data filed at 09/25/16 2124  Gross per 24 hour  Intake              960 ml  Output             1301 ml  Net             -341 ml   Filed Weights   09/24/16 0405 09/24/16 0409 09/24/16 1536  Weight: 56.2 kg (124 lb) 54.4 kg (120 lb) 56.4 kg (124 lb 4.8 oz)    Exam: General exam: Calm and comfortable. Respiratory system: Clear to auscultation. Respiratory effort normal. Cardiovascular system: S1 & S2 heard, RRR. No JVD,  rubs, gallops or clicks. No murmurs. Gastrointestinal system: Abdomen is nondistended, soft and nontender. No organomegaly or masses felt. Normal bowel sounds heard. Central nervous system: Alert and oriented. No focal neurological deficits. Extremities: No clubbing, or cyanosis. No edema. Skin: No rashes, lesions or ulcers. Psychiatry: Judgement and insight appear normal. Mood & affect mildly anxious.   Data Reviewed:   I have personally reviewed following labs and imaging studies:  Labs: Basic Metabolic Panel:  Recent Labs Lab 09/24/16 0440 09/26/16 0402  NA 141 137  K 4.2 4.6  CL 103 99*    CO2 24 26  GLUCOSE 258* 143*  BUN 32* 42*  CREATININE 2.31* 2.67*  CALCIUM 9.5 9.3   GFR Estimated Creatinine Clearance: 12 mL/min (by C-G formula based on SCr of 2.67 mg/dL (H)). Liver Function Tests: No results for input(s): AST, ALT, ALKPHOS, BILITOT, PROT, ALBUMIN in the last 168 hours. No results for input(s): LIPASE, AMYLASE in the last 168 hours. No results for input(s): AMMONIA in the last 168 hours. Coagulation profile No results for input(s): INR, PROTIME in the last 168 hours.  CBC:  Recent Labs Lab 09/24/16 0440  WBC 6.2  HGB 13.5  HCT 42.4  MCV 88.0  PLT 166   Cardiac Enzymes:  Recent Labs Lab 09/24/16 1326 09/24/16 1849 09/25/16 0017 09/25/16 0630 09/26/16 0402  TROPONINI 3.46* 9.11* 17.45* 26.85* 26.85*   BNP (last 3 results)  Recent Labs  09/08/16 1417  PROBNP 2,251.0*   CBG:  Recent Labs Lab 09/25/16 1223 09/25/16 1716 09/25/16 2118 09/26/16 0607  GLUCAP 217* 115* 150* 140*   D-Dimer:  Recent Labs  09/24/16 0415  DDIMER 1.18*    Microbiology No results found for this or any previous visit (  from the past 240 hour(s)).  Radiology: Nm Pulmonary Perf And Vent  Result Date: 09/24/2016 CLINICAL DATA:  Left side chest pain. EXAM: NUCLEAR MEDICINE VENTILATION - PERFUSION LUNG SCAN TECHNIQUE: Ventilation images were obtained in multiple projections using inhaled aerosol Tc-37m DTPA. Perfusion images were obtained in multiple projections after intravenous injection of Tc-44m MAA. RADIOPHARMACEUTICALS:  30.3 mCi Technetium-47m DTPA aerosol inhalation and 4.1 mCi Technetium-75m MAA IV COMPARISON:  Chest x-ray earlier today FINDINGS: Ventilation: Patchy nonsegmental ventilation defects, possibly related to obstructive lung disease Perfusion: No wedge shaped peripheral perfusion defects to suggest acute pulmonary embolism. IMPRESSION: No evidence of pulmonary embolus. Electronically Signed   By: Rolm Baptise M.D.   On: 09/24/2016 11:09     Medications:   . amLODipine  10 mg Oral QHS  . aspirin EC  81 mg Oral Daily  . calcitRIOL  0.25 mcg Oral Daily  . carvedilol  6.25 mg Oral BID WC  . furosemide  40 mg Oral Daily  . gi cocktail  30 mL Oral Once  . heparin  5,000 Units Subcutaneous Q8H  . hydrALAZINE  25 mg Oral QID  . insulin aspart  0-5 Units Subcutaneous QHS  . insulin aspart  0-9 Units Subcutaneous TID WC  . insulin aspart  3 Units Subcutaneous TID WC  . isosorbide mononitrate  30 mg Oral Daily  . sacubitril-valsartan  1 tablet Oral BID   Continuous Infusions:  Medical decision making is of high complexity and this patient is at high risk of deterioration, therefore this is a level 3 visit.  (> 4 problem points, 3 data points, high risk)   LOS: 1 day   RAMA,CHRISTINA  [facility] Pager [phone]. If unable to reach me by pager, please call my cell phone at [phone].  *Please refer to [email or web address], password [email or web address] to get updated schedule on who will round on this patient, as hospitalists switch teams weekly. If 7PM-7AM, please contact night-coverage at [email or web address], password [email or web address] for any overnight needs.  09/26/2016, 8:09 AM

## 2016-09-27 LAB — BASIC METABOLIC PANEL
ANION GAP: 9 (ref 5–15)
BUN: 51 mg/dL — ABNORMAL HIGH (ref 6–20)
CHLORIDE: 101 mmol/L (ref 101–111)
CO2: 24 mmol/L (ref 22–32)
Calcium: 9 mg/dL (ref 8.9–10.3)
Creatinine, Ser: 2.78 mg/dL — ABNORMAL HIGH (ref 0.44–1.00)
GFR calc Af Amer: 17 mL/min — ABNORMAL LOW (ref >60)
GFR, EST NON AFRICAN AMERICAN: 14 mL/min — AB (ref >60)
GLUCOSE: 223 mg/dL — AB (ref 65–99)
POTASSIUM: 4.9 mmol/L (ref 3.5–5.1)
SODIUM: 134 mmol/L — AB (ref 135–145)

## 2016-09-27 LAB — TROPONIN I: Troponin I: 19.1 ng/mL (ref <0.03)

## 2016-09-27 LAB — GLUCOSE, CAPILLARY
GLUCOSE-CAPILLARY: 145 mg/dL — AB (ref 65–99)
GLUCOSE-CAPILLARY: 136 mg/dL — AB (ref 65–99)

## 2016-09-27 NOTE — Progress Notes (Signed)
Progress Note  Patient Name: [name] Date of Encounter: 09/27/2016  Primary Cardiologist: Hochrein  Subjective   83 yo with mild CAD, HTN,CKD, pulmonary embolus, systolic CHF,  Admitted with CP / NSTEMI. troponins have continued to rise,   She is feeling well  Trop is now 76 and trending down   Echocardiogram reveals severe left ventricular dysfunction with an ejection fraction of between 20 and 25%. She has grade 1 diastolic dysfunction. Mild mitral regurgitation. Moderate pulmonary artery hypertension with an estimated PA pressure of 47. There is no pericardial effusion. Compared to her previous study in January, the left ventricle systolic function has improved slightly. Inpatient Medications    Scheduled Meds: . amLODipine  10 mg Oral QHS  . aspirin EC  81 mg Oral Daily  . calcitRIOL  0.25 mcg Oral Daily  . carvedilol  6.25 mg Oral BID WC  . gi cocktail  30 mL Oral Once  . heparin  5,000 Units Subcutaneous Q8H  . hydrALAZINE  25 mg Oral QID  . insulin aspart  0-5 Units Subcutaneous QHS  . insulin aspart  0-9 Units Subcutaneous TID WC  . insulin aspart  3 Units Subcutaneous TID WC  . isosorbide mononitrate  30 mg Oral Daily  . sacubitril-valsartan  1 tablet Oral BID   Continuous Infusions:  PRN Meds: acetaminophen, gi cocktail, morphine injection, nitroGLYCERIN, ondansetron (ZOFRAN) IV, technetium TC 36M diethylenetriame-pentaacetic acid, vitamin C   Vital Signs    Vitals:   09/26/16 1334 09/26/16 2009 09/27/16 0448 09/27/16 0851  BP: 137/69 120/67 117/61 106/62  Pulse: (!) 56 74 70 66  Resp:  18 20   Temp: 98.3 F (36.8 C) 98.3 F (36.8 C) 98.6 F (37 C)   TempSrc: Oral Oral Oral   SpO2: 97% 94% 97%   Weight:      Height:        Intake/Output Summary (Last 24 hours) at 09/27/16 1041 Last data filed at 09/27/16 0900  Gross per 24 hour  Intake              240 ml  Output              900 ml  Net             -660 ml   Filed Weights   09/24/16 0405 09/24/16 0409 09/24/16 1536  Weight: 124 lb (56.2 kg) 120 lb (54.4 kg) 124 lb 4.8 oz (56.4 kg)    Telemetry    NSR  - Personally Reviewed  ECG       Physical Exam   GEN: No acute distress.   Neck: No JVD Cardiac: RRR, no murmurs, rubs, or gallops.  Respiratory: Clear to auscultation bilaterally. GI: Soft, nontender, non-distended  MS: No edema; No deformity. Neuro:  Nonfocal  Psych: Normal affect   Labs    Chemistry  Recent Labs Lab 09/24/16 0440 09/26/16 0402 09/27/16 0301  NA 141 137 134*  K 4.2 4.6 4.9  CL 103 99* 101  CO2 24 26 24   GLUCOSE 258* 143* 223*  BUN 32* 42* 51*  CREATININE 2.31* 2.67* 2.78*  CALCIUM 9.5 9.3 9.0  GFRNONAA 18* 15* 14*  GFRAA 21* 18* 17*  ANIONGAP 14 12 9      Hematology  Recent Labs Lab 09/24/16 0440  WBC 6.2  RBC 4.82  HGB 13.5  HCT 42.4  MCV 88.0  MCH 28.0  MCHC 31.8  RDW 14.2  PLT 166    Cardiac  Enzymes  Recent Labs Lab 09/25/16 0017 09/25/16 0630 09/26/16 0402 09/27/16 0745  TROPONINI 17.45* 26.85* 26.85* 19.10*     Recent Labs Lab 09/24/16 0408  TROPIPOC 0.09*     BNP  Recent Labs Lab 09/24/16 0405  BNP 1,016.2*     DDimer   Recent Labs Lab 09/24/16 0415  DDIMER 1.18*     Radiology    No results found.  Cardiac Studies     Patient Profile     83 y.o. female hx of CAD, CHF, CKD,   Assessment & Plan   1. NSTEMI:   Troponins Are now trending downward. It is clear  that she had a non-ST segment elevation myocardial infarction. She is a very poor candidate for cardiac catheterization because of her stage 3-4 chronic kidney disease.  Clinically, she is doing very well and has not had any further episodes of chest discomfort. Her echocardiogram shows some improvement of her left ventricular systolic function although it is  still severely depressed.  From our standpoint it appears that she can be safely discharged to home. She will follow-up with Dr. Percival Spanish in the  office.   2. CHF   Her echocardiogram reveals severe left ventricular dysfunction but actually is improved slightly compared to the previous echo in January.  3. CKD :   . Further evaluation and management per [facility]  internal medicine team.   Signed, Mertie Moores, MD  09/27/2016, 10:41 AM

## 2016-09-27 NOTE — Discharge Summary (Signed)
Physician Discharge Summary  [name] [id number] DOB: Sep 27, 1927 DOA: 09/24/2016  PCP: Binnie Rail, MD  Admit date: 09/24/2016 Discharge date: 09/27/2016  Admitted From: Home Discharge disposition: Home   Recommendations for Outpatient Follow-Up:   1. Needs close follow-up with cardiologist and nephrologist.   Discharge Diagnosis:   Principal Problem:    Chest pain secondary to NSTEMI  Active Problems:    HYPERCHOLESTEROLEMIA    Essential hypertension    Hypertensive cardiomyopathy (HCC)    PAROXYSMAL ATRIAL FIBRILLATION    CKD (chronic kidney disease), stage IV (HCC)    Diabetes mellitus type 2 in nonobese (HCC)    Elevated troponin    Chronic systolic heart failure (HCC)    History of pulmonary embolism    NSTEMI (non-ST elevated myocardial infarction) [facility])   Discharge Condition: Improved.  Diet recommendation: Low sodium, heart healthy.  Carbohydrate-modified.    History of Present Illness:   [name] is an 83 y.o. female with a history of systolic CHF believed to be 2/2 hypertensive cardiomyopathy with recent admission for exacerbation in January, nonobstructive CAD per 2010 cath, paroxysmal afib, NSTEMI in 05/2016, CKD stage 4, GERD, previous PE 2012, and type 2 DM who was admitted 09/24/16 with chest pain and NSTEMI.  Hospital Course by Problem:   Principal Problem:   Chest pain secondary to non-ST elevation MI with elevated troponin Admission heart score was 5. VQ scan low probability for pulmonary embolism. Resolved after being given morphine in the ED. Chest pain was atypical in nature but was noted to have elevated troponin, which has steadily risen on serial evaluation. Has been seen by cardiology who recommends a conservative approach given her impaired renal function. 2-D echo done 09/25/16: EF 20-25 percent with diffuse hypokinesis and grade 1 diastolic dysfunction, Which is slightly improved over her prior echocardiogram.  Will need close cardiology follow-up.   Active Problems:   HYPERCHOLESTEROLEMIA Is allergic to statins. Heart healthy diet.    Essential hypertension Continue Norvasc, Coreg, Lasix, hydralazine, Entresto. Blood pressure well controlled.     Hypertensive cardiomyopathy (HCC)/chronic systolic and diastolic CHF Echo done one week ago revealed an EF of 15%. recentadmission for acute on chronic heart failure. Underwent diuresis. BNP elevated on admission but appears compensated clinically. Chest x-ray personally reviewed. Heart enlarged, but no frank failure appreciated. Repeat echocardiogram showed EF 20-25 percent with grade 1 diastolic dysfunction, slightly improved EF.     PAROXYSMAL ATRIAL FIBRILLATION Not on blood thinners secondary to high fall risk. 12-lead EKG reviewed. Sinus rhythm with a left bundle branch block. Rate controlled.    CKD (chronic kidney disease), stage IV (HCC) Severe impairment of kidney function makes cardiac catheterization very risky.  Monitor renal function closely and avoid nephrotoxic medications if possible. Continue calcitriol. No improvement in creatinine despite holding Lasix. Will need close outpatient follow-up with nephrology.    Diabetes mellitus type 2 in nonobese [facility]) Oral hypoglycemics on hold. Her last hemoglobin A1c was 6.6%, approximately 2 months ago, so no need to repeat at this time. Currently being managed with insulin sensitive SSI and 3 units of meal coverage. CBGs 73-180. Resume oral hypoglycemics at discharge.    History of pulmonary embolism/moderate pulmonary hypertension VQ scan low probability.   Medical Consultants:    Cardiology.   Discharge Exam:   Vitals:   09/27/16 0448 09/27/16 0851  BP: 117/61 106/62  Pulse: 70 66  Resp: 20   Temp: 98.6 F (37 C)    Vitals:  09/26/16 1334 09/26/16 2009 09/27/16 0448 09/27/16 0851  BP: 137/69 120/67 117/61 106/62  Pulse: (!) 56 74 70 66  Resp:  18 20   Temp: 98.3 F  (36.8 C) 98.3 F (36.8 C) 98.6 F (37 C)   TempSrc: Oral Oral Oral   SpO2: 97% 94% 97%   Weight:      Height:       General exam: Calm and comfortable. Respiratory system: Clear to auscultation. Respiratory effort normal. Cardiovascular system: S1 & S2 heard, RRR. No JVD,  rubs, gallops or clicks. No murmurs. Gastrointestinal system: Abdomen is nondistended, soft and nontender. No organomegaly or masses felt. Normal bowel sounds heard. Central nervous system: Alert and oriented. No focal neurological deficits. Extremities: No clubbing, or cyanosis. No edema. Skin: No rashes, lesions or ulcers. Psychiatry: Judgement and insight appear normal. Mood & affect normal.    The results of significant diagnostics from this hospitalization (including imaging, microbiology, ancillary and laboratory) are listed below for reference.     Procedures and Diagnostic Studies:   Dg Chest 2 View  Result Date: 09/24/2016 CLINICAL DATA:  83 y/o F; left-sided chest pain radiating down the left arm. EXAM: CHEST  2 VIEW COMPARISON:  09/08/2016 chest radiograph FINDINGS: Stable moderate cardiomegaly. No focal consolidation. No pleural effusion. Right shoulder rotator cuff repair changes. No acute osseous abnormality identified. IMPRESSION: Stable cardiomegaly.  No focal consolidation or effusion. Electronically Signed   By: Kristine Garbe M.D.   On: 09/24/2016 05:23   Nm Pulmonary Perf And Vent  Result Date: 09/24/2016 CLINICAL DATA:  Left side chest pain. EXAM: NUCLEAR MEDICINE VENTILATION - PERFUSION LUNG SCAN TECHNIQUE: Ventilation images were obtained in multiple projections using inhaled aerosol Tc-39m DTPA. Perfusion images were obtained in multiple projections after intravenous injection of Tc-70m MAA. RADIOPHARMACEUTICALS:  30.3 mCi Technetium-69m DTPA aerosol inhalation and 4.1 mCi Technetium-72m MAA IV COMPARISON:  Chest x-ray earlier today FINDINGS: Ventilation: Patchy nonsegmental ventilation  defects, possibly related to obstructive lung disease Perfusion: No wedge shaped peripheral perfusion defects to suggest acute pulmonary embolism. IMPRESSION: No evidence of pulmonary embolus. Electronically Signed   By: Rolm Baptise M.D.   On: 09/24/2016 11:09     Labs:   Basic Metabolic Panel:  Recent Labs Lab 09/24/16 0440 09/26/16 0402 09/27/16 0301  NA 141 137 134*  K 4.2 4.6 4.9  CL 103 99* 101  CO2 24 26 24   GLUCOSE 258* 143* 223*  BUN 32* 42* 51*  CREATININE 2.31* 2.67* 2.78*  CALCIUM 9.5 9.3 9.0   GFR Estimated Creatinine Clearance: 11.5 mL/min (by C-G formula based on SCr of 2.78 mg/dL (H)). Liver Function Tests: No results for input(s): AST, ALT, ALKPHOS, BILITOT, PROT, ALBUMIN in the last 168 hours. No results for input(s): LIPASE, AMYLASE in the last 168 hours. No results for input(s): AMMONIA in the last 168 hours. Coagulation profile No results for input(s): INR, PROTIME in the last 168 hours.  CBC:  Recent Labs Lab 09/24/16 0440  WBC 6.2  HGB 13.5  HCT 42.4  MCV 88.0  PLT 166   Cardiac Enzymes:  Recent Labs Lab 09/24/16 1849 09/25/16 0017 09/25/16 0630 09/26/16 0402 09/27/16 0745  TROPONINI 9.11* 17.45* 26.85* 26.85* 19.10*   BNP: Invalid input(s): POCBNP CBG:  Recent Labs Lab 09/26/16 1114 09/26/16 1620 09/26/16 2145 09/27/16 0634 09/27/16 1143  GLUCAP 180* 73 171* 145* 136*     Discharge Instructions:   Discharge Instructions    Call MD for:  difficulty breathing, headache or  visual disturbances    Complete by:  As directed    Call MD for:  extreme fatigue    Complete by:  As directed    Call MD for:  persistant dizziness or light-headedness    Complete by:  As directed    Call MD for:  persistant nausea and vomiting    Complete by:  As directed    Call MD for:  severe uncontrolled pain    Complete by:  As directed    Diet - low sodium heart healthy    Complete by:  As directed    Diet Carb Modified    Complete by:   As directed    Increase activity slowly    Complete by:  As directed      Allergies as of 09/27/2016      Reactions   Statins Other (See Comments)   Muscle Aches   Lisinopril Other (See Comments), Cough   REACTION: INTOL to ACE's w/ cough   Penicillins Itching, Rash      Medication List    TAKE these medications   amLODipine 10 MG tablet Commonly known as:  NORVASC Take 10 mg by mouth at bedtime.   aspirin EC 81 MG tablet Take 1 tablet (81 mg total) by mouth daily.   calcitRIOL 0.25 MCG capsule Commonly known as:  ROCALTROL Take 0.25 mcg by mouth daily.   carvedilol 6.25 MG tablet Commonly known as:  COREG Take 1 tablet (6.25 mg total) by mouth 2 (two) times daily with a meal.   furosemide 20 MG tablet Commonly known as:  LASIX Take 2 tablets (40 mg total) by mouth daily.   hydrALAZINE 25 MG tablet Commonly known as:  APRESOLINE Take 1 tablet (25 mg total) by mouth 4 (four) times daily.   isosorbide mononitrate 30 MG 24 hr tablet Commonly known as:  IMDUR Take 1 tablet (30 mg total) by mouth daily.   nitroGLYCERIN 0.4 MG SL tablet Commonly known as:  NITROSTAT Place 1 tablet (0.4 mg total) under the tongue every 5 (five) minutes as needed for chest pain.   repaglinide 2 MG tablet Commonly known as:  PRANDIN TAKE 1 TABLET (2 MG TOTAL) BY MOUTH 3 (THREE) TIMES DAILY BEFORE MEALS. What changed:  See the new instructions.   sacubitril-valsartan 24-26 MG Commonly known as:  ENTRESTO Take 1 tablet by mouth 2 (two) times daily.   vitamin C 1000 MG tablet Take 1,000 mg by mouth daily as needed (FOR IMMUNE).      Follow-up Information    Minus Breeding, MD Follow up in 1 week(s).   Specialty:  Cardiology Why:  Hospital follow up. Contact information: [address] [place] [address] [phone]        Rexene Agent, MD Follow up in 1 week(s).   Specialty:  Nephrology Why:  Re-evaluate your kidney function. Contact information: [place] [address] [phone]            Time coordinating discharge: 35 minutes.  Signed:  RAMA,CHRISTINA  Pager [phone] [facility] 09/27/2016, 2:47 PM

## 2016-09-27 NOTE — Progress Notes (Signed)
Pt has been discharged home with family. IV and telemetry box removed. Pt received discharge instructions and all questions were answered. Pt left with all of her belongings. Pt left the unit via wheelchair and was accompanied by this RN and pt's family.   Grant Fontana BSN, RN

## 2016-09-27 NOTE — Discharge Instructions (Signed)
Angina Pectoris Angina pectoris is a very bad feeling in the chest, neck, or arm. Your doctor may call it angina. There are four types of angina. Angina is caused by a lack of blood in the middle and thickest layer of the heart wall (myocardium). Angina may feel like a crushing or squeezing pain in the chest. It may feel like tightness or heavy pressure in the chest. Some people say it feels like gas, heartburn, or indigestion. Some people have symptoms other than pain. These include:  Shortness of breath.  Cold sweats.  Feeling sick to your stomach (nausea).  Feeling light-headed.  Many women have chest discomfort and some of the other symptoms. However, women often have different symptoms, such as:  Feeling tired (fatigue).  Feeling nervous for no reason.  Feeling weak for no reason.  Dizziness or fainting.  Women may have angina without any symptoms. Follow these instructions at home:  Take medicines only as told by your doctor.  Take care of other health issues as told by your doctor. These include: ? High blood pressure (hypertension). ? Diabetes.  Follow a heart-healthy diet. Your doctor can help you to choose healthy food options and make changes.  Talk to your doctor to learn more about healthy cooking methods and use them. These include: ? Roasting. ? Grilling. ? Broiling. ? Baking. ? Poaching. ? Steaming. ? Stir-frying.  Follow an exercise program approved by your doctor.  Keep a healthy weight. Lose weight as told by your doctor.  Rest when you are tired.  Learn to manage stress.  Do not use any tobacco, such as cigarettes, chewing tobacco, or electronic cigarettes. If you need help quitting, ask your doctor.  If you drink alcohol, and your doctor says it is okay, limit yourself to no more than 1 drink per day. One drink equals 12 ounces of beer, 5 ounces of wine, or 1 ounces of hard liquor.  Stop illegal drug use.  Keep all follow-up visits as told  by your doctor. This is important. Do not take these medicines unless your doctor says that you can:  Nonsteroidal anti-inflammatory drugs (NSAIDs). These include: ? Ibuprofen. ? Naproxen. ? Celecoxib.  Vitamin supplements that have vitamin A, vitamin E, or both.  Hormone therapy that contains estrogen with or without progestin.  Get help right away if:  You have pain in your chest, neck, arm, jaw, stomach, or back that: ? Lasts more than a few minutes. ? Comes back. ? Does not get better after you take medicine under your tongue (sublingual nitroglycerin).  You have any of these symptoms for no reason: ? Gas, heartburn, or indigestion. ? Sweating a lot. ? Shortness of breath or trouble breathing. ? Feeling sick to your stomach or throwing up. ? Feeling more tired than usual. ? Feeling nervous or worrying more than usual. ? Feeling weak. ? Diarrhea.  You are suddenly dizzy or light-headed.  You faint or pass out. These symptoms may be an emergency. Do not wait to see if the symptoms will go away. Get medical help right away. Call your local emergency services (911 in the [place] Do not drive yourself to the hospital. This information is not intended to replace advice given to you by your health care provider. Make sure you discuss any questions you have with your health care provider. Document Released: 01/20/2008 Document Revised: 01/09/2016 Document Reviewed: 12/05/2013 Elsevier Interactive Patient Education  2017 Elsevier Inc.  

## 2016-09-28 ENCOUNTER — Other Ambulatory Visit: Payer: Self-pay | Admitting: Endocrinology

## 2016-09-28 ENCOUNTER — Telehealth: Payer: Self-pay | Admitting: Endocrinology

## 2016-09-28 ENCOUNTER — Ambulatory Visit: Payer: Medicare Other | Admitting: Endocrinology

## 2016-09-28 MED ORDER — ONETOUCH VERIO IQ SYSTEM W/DEVICE KIT: PACK | 2 refills | Status: AC

## 2016-09-28 MED ORDER — GLUCOSE BLOOD VI STRP: ORAL_STRIP | 2 refills | Status: AC

## 2016-09-28 NOTE — Telephone Encounter (Signed)
Patient  Her One touch verio  Sent to [id number] - [place], [place] [phone] (Phone) [phone] (Fax)

## 2016-09-28 NOTE — Telephone Encounter (Signed)
Refills submitted.  

## 2016-09-29 ENCOUNTER — Telehealth: Payer: Self-pay | Admitting: *Deleted

## 2016-09-29 NOTE — Telephone Encounter (Signed)
Pt was on TCM list admitted for Chest pain secondary to NSTEMI. Pt was d/c on 09/27/16, and will be following up w/cardiology in 1 week...[name]

## 2016-10-02 ENCOUNTER — Ambulatory Visit (INDEPENDENT_AMBULATORY_CARE_PROVIDER_SITE_OTHER): Payer: Medicare Other | Admitting: Nurse Practitioner

## 2016-10-02 ENCOUNTER — Encounter: Payer: Self-pay | Admitting: Nurse Practitioner

## 2016-10-02 ENCOUNTER — Other Ambulatory Visit (INDEPENDENT_AMBULATORY_CARE_PROVIDER_SITE_OTHER): Payer: Medicare Other

## 2016-10-02 VITALS — BP 104/56 | HR 52 | Temp 97.9°F | Resp 16 | Ht 61.0 in | Wt 124.8 lb

## 2016-10-02 DIAGNOSIS — I10 Essential (primary) hypertension: Secondary | ICD-10-CM

## 2016-10-02 DIAGNOSIS — E119 Type 2 diabetes mellitus without complications: Secondary | ICD-10-CM

## 2016-10-02 DIAGNOSIS — N184 Chronic kidney disease, stage 4 (severe): Secondary | ICD-10-CM | POA: Diagnosis not present

## 2016-10-02 DIAGNOSIS — I5022 Chronic systolic (congestive) heart failure: Secondary | ICD-10-CM

## 2016-10-02 LAB — BASIC METABOLIC PANEL
BUN: 50 mg/dL — AB (ref 6–23)
CHLORIDE: 110 meq/L (ref 96–112)
CO2: 21 meq/L (ref 19–32)
CREATININE: 2.5 mg/dL — AB (ref 0.40–1.20)
Calcium: 9.3 mg/dL (ref 8.4–10.5)
GFR: 23.44 mL/min — ABNORMAL LOW (ref >60.00)
GLUCOSE: 81 mg/dL (ref 70–99)
POTASSIUM: 4.8 meq/L (ref 3.5–5.1)
Sodium: 139 mEq/L (ref 135–145)

## 2016-10-02 LAB — BRAIN NATRIURETIC PEPTIDE: Pro B Natriuretic peptide (BNP): 666 pg/mL — ABNORMAL HIGH (ref 0.0–100.0)

## 2016-10-02 NOTE — Patient Instructions (Signed)
Please call nephrologist for follow up appt ASAP.  Maintain upcoming appt with cardiology.  Go to basement for blood draw.   Heart Failure Heart failure means your heart has trouble pumping blood. This makes it hard for your body to work well. Heart failure is usually a long-term (chronic) condition. You must take good care of yourself and follow your doctor's treatment plan. HOME CARE  Take your heart medicine as told by your doctor.  Do not stop taking medicine unless your doctor tells you to.  Do not skip any dose of medicine.  Refill your medicines before they run out.  Take other medicines only as told by your doctor or pharmacist.  Stay active if told by your doctor. The elderly and people with severe heart failure should talk with a doctor about physical activity.  Eat heart-healthy foods. Choose foods that are without trans fat and are low in saturated fat, cholesterol, and salt (sodium). This includes fresh or frozen fruits and vegetables, fish, lean meats, fat-free or low-fat dairy foods, whole grains, and high-fiber foods. Lentils and dried peas and beans (legumes) are also good choices.  Limit salt if told by your doctor.  Cook in a healthy way. Roast, grill, broil, bake, poach, steam, or stir-fry foods.  Limit fluids as told by your doctor.  Weigh yourself every morning. Do this after you pee (urinate) and before you eat breakfast. Write down your weight to give to your doctor.  Take your blood pressure and write it down if your doctor tells you to.  Ask your doctor how to check your pulse. Check your pulse as told.  Lose weight if told by your doctor.  Stop smoking or chewing tobacco. Do not use gum or patches that help you quit without your doctor's approval.  Schedule and go to doctor visits as told.  Nonpregnant women should have no more than 1 drink a day. Men should have no more than 2 drinks a day. Talk to your doctor about drinking alcohol.  Stop  illegal drug use.  Stay current with shots (immunizations).  Manage your health conditions as told by your doctor.  Learn to manage your stress.  Rest when you are tired.  If it is really hot outside:  Avoid intense activities.  Use air conditioning or fans, or get in a cooler place.  Avoid caffeine and alcohol.  Wear loose-fitting, lightweight, and light-colored clothing.  If it is really cold outside:  Avoid intense activities.  Layer your clothing.  Wear mittens or gloves, a hat, and a scarf when going outside.  Avoid alcohol.  Learn about heart failure and get support as needed.  Get help to maintain or improve your quality of life and your ability to care for yourself as needed. GET HELP IF:   You gain weight quickly.  You are more short of breath than usual.  You cannot do your normal activities.  You tire easily.  You cough more than normal, especially with activity.  You have any or more puffiness (swelling) in areas such as your hands, feet, ankles, or belly (abdomen).  You cannot sleep because it is hard to breathe.  You feel like your heart is beating fast (palpitations).  You get dizzy or light-headed when you stand up. GET HELP RIGHT AWAY IF:   You have trouble breathing.  There is a change in mental status, such as becoming less alert or not being able to focus.  You have chest pain or discomfort.  You faint. MAKE SURE YOU:   Understand these instructions.  Will watch your condition.  Will get help right away if you are not doing well or get worse. This information is not intended to replace advice given to you by your health care provider. Make sure you discuss any questions you have with your health care provider. Document Released: 05/12/2008 Document Revised: 08/24/2014 Document Reviewed: 09/19/2012 Elsevier Interactive Patient Education  2017 [employer].

## 2016-10-02 NOTE — Progress Notes (Signed)
Subjective:  Patient ID: [name], female    DOB: 28-Jan-1928  Age: 83 y.o. MRN: [id number]  CC: Hospitalization Follow-up (Chest pain, HTN, DM II, mild heart attack, SOB)   HPI Admitted: 09/24/2016 Discharged: 09/27/2016.  [name] was hospitalized with atypical chest pain and elevated troponin. She was diagnosed with NSTEMI. Due to impaired kidney function, cardiologist recommended conservative approach. Echocardiogram indicated EF of 20/25%, diffuse hypokinesis and diastolic dysfunction. She is to f/up with cardiologist and nephrologist.   TCM call was not done.  Medication list was reconciled in office today.  Labs and radiology report were reviewed.  Dr. Joelyn Oms (nephrologist). Has not scheduled appt.  Has upcoming appt with cardiologist 10/06/16. Patient aware.   During office visit, she denies chest pain or dizziness or PND. She has persistent SOB with exacertion, resolved edema, no dizziness. She is part of heart care program. She maintains low salt diet.   Outpatient Medications Prior to Visit  Medication Sig Dispense Refill  . amLODipine (NORVASC) 10 MG tablet Take 10 mg by mouth at bedtime.    . Ascorbic Acid (VITAMIN C) 1000 MG tablet Take 1,000 mg by mouth daily as needed (FOR IMMUNE).    Marland Kitchen aspirin EC 81 MG tablet Take 1 tablet (81 mg total) by mouth daily.    . Blood Glucose Monitoring Suppl (ONETOUCH VERIO IQ SYSTEM) w/Device KIT Use to check blood sugar 1 time per day. 1 kit 2  . calcitRIOL (ROCALTROL) 0.25 MCG capsule Take 0.25 mcg by mouth daily.    . carvedilol (COREG) 6.25 MG tablet Take 1 tablet (6.25 mg total) by mouth 2 (two) times daily with a meal. 60 tablet 6  . furosemide (LASIX) 20 MG tablet Take 2 tablets (40 mg total) by mouth daily. 30 tablet 1  . glucose blood (ONETOUCH VERIO) test strip Use to check blood sugar 1 time per day. 100 each 2  . hydrALAZINE (APRESOLINE) 25 MG tablet Take 1 tablet (25 mg total) by mouth 4 (four) times  daily. 120 tablet 3  . isosorbide mononitrate (IMDUR) 30 MG 24 hr tablet Take 1 tablet (30 mg total) by mouth daily. 30 tablet 2  . nitroGLYCERIN (NITROSTAT) 0.4 MG SL tablet Place 1 tablet (0.4 mg total) under the tongue every 5 (five) minutes as needed for chest pain. 50 tablet 0  . repaglinide (PRANDIN) 2 MG tablet TAKE 1 TABLET (2 MG TOTAL) BY MOUTH 3 (THREE) TIMES DAILY BEFORE MEALS. 90 tablet 0  . sacubitril-valsartan (ENTRESTO) 24-26 MG Take 1 tablet by mouth 2 (two) times daily. 60 tablet 6   No facility-administered medications prior to visit.     ROS See HPI  Objective:  BP (!) 104/56   Pulse (!) 52   Temp 97.9 F (36.6 C) (Oral)   Resp 16   Ht _0  (1.549 m) Comment: measured without shoes  Wt 124 lb 12 oz (56.6 kg)   SpO2 96%   BMI 23.57 kg/m   BP Readings from Last 3 Encounters:  10/02/16 (!) 104/56  09/27/16 106/62  09/11/16 114/72    Wt Readings from Last 3 Encounters:  10/02/16 124 lb 12 oz (56.6 kg)  09/24/16 124 lb 4.8 oz (56.4 kg)  09/11/16 124 lb 14.4 oz (56.7 kg)    Physical Exam  Constitutional: She is oriented to person, place, and time. No distress.  Neck: Normal range of motion. Neck supple.  Cardiovascular: Normal rate and normal heart sounds.   Irregular rhythm  Pulmonary/Chest:  Effort normal and breath sounds normal. No respiratory distress.  Abdominal: Soft. She exhibits no distension.  Musculoskeletal: Normal range of motion. She exhibits no edema.  Neurological: She is alert and oriented to person, place, and time.  Skin: Skin is warm and dry.  Psychiatric: She has a normal mood and affect. Her behavior is normal.  Vitals reviewed.   Lab Results  Component Value Date   WBC 6.2 09/24/2016   HGB 13.5 09/24/2016   HCT 42.4 09/24/2016   PLT 166 09/24/2016   GLUCOSE 81 10/02/2016   CHOL 215 (H) 07/20/2016   TRIG 92.0 07/20/2016   HDL 95.10 07/20/2016   LDLDIRECT 135.7 04/26/2013   LDLCALC 101 (H) 07/20/2016   ALT 31 09/08/2016     AST 29 09/08/2016   NA 139 10/02/2016   K 4.8 10/02/2016   CL 110 10/02/2016   CREATININE 2.50 (H) 10/02/2016   BUN 50 (H) 10/02/2016   CO2 21 10/02/2016   TSH 1.44 07/20/2016   INR 0.92 05/31/2016   HGBA1C 6.9 (H) 07/20/2016   MICROALBUR 89.8 (H) 07/20/2016    Dg Chest 2 View  Result Date: 09/24/2016 CLINICAL DATA:  83 y/o F; left-sided chest pain radiating down the left arm. EXAM: CHEST  2 VIEW COMPARISON:  09/08/2016 chest radiograph FINDINGS: Stable moderate cardiomegaly. No focal consolidation. No pleural effusion. Right shoulder rotator cuff repair changes. No acute osseous abnormality identified. IMPRESSION: Stable cardiomegaly.  No focal consolidation or effusion. Electronically Signed   By: Kristine Garbe M.D.   On: 09/24/2016 05:23   Nm Pulmonary Perf And Vent  Result Date: 09/24/2016 CLINICAL DATA:  Left side chest pain. EXAM: NUCLEAR MEDICINE VENTILATION - PERFUSION LUNG SCAN TECHNIQUE: Ventilation images were obtained in multiple projections using inhaled aerosol Tc-52mDTPA. Perfusion images were obtained in multiple projections after intravenous injection of Tc-962mAA. RADIOPHARMACEUTICALS:  30.3 mCi Technetium-9922mPA aerosol inhalation and 4.1 mCi Technetium-77m48m IV COMPARISON:  Chest x-ray earlier today FINDINGS: Ventilation: Patchy nonsegmental ventilation defects, possibly related to obstructive lung disease Perfusion: No wedge shaped peripheral perfusion defects to suggest acute pulmonary embolism. IMPRESSION: No evidence of pulmonary embolus. Electronically Signed   By: KeviRolm Baptise.   On: 09/24/2016 11:09    Assessment & Plan:   [name] seen today for hospitalization follow-up.  Diagnoses and all orders for this visit:  CKD (chronic kidney disease), stage IV (HCC)[place]    Basic metabolic panel; Future  Diabetes mellitus type 2 in nonobese ([facility] -     Basic metabolic panel; Future  Chronic systolic heart failure  (HCC) -     Basic metabolic panel; Future -     B Nat Peptide; Future   I am having [name] maintain her amLODipine, vitamin C, calcitRIOL, nitroGLYCERIN, isosorbide mononitrate, aspirin EC, carvedilol, furosemide, hydrALAZINE, sacubitril-valsartan, repaglinide, glucose blood, and ONETOUCH VERIO IQ SYSTEM.  No orders of the defined types were placed in this encounter.   Follow-up: Return if symptoms worsen or fail to improve.  CharWilfred Lacy

## 2016-10-02 NOTE — Progress Notes (Signed)
Pre-visit discussion using our clinic review tool. No additional management support is needed unless otherwise documented below in the visit note.  

## 2016-10-03 NOTE — Assessment & Plan Note (Signed)
Improved BNP from 2251 to 666. Resolved edema. Stable weight.

## 2016-10-03 NOTE — Assessment & Plan Note (Addendum)
Controlled with prandin No hypoglycemic symptoms. Has not checked glucose since discharge. Glucose ranging from 75 to 180 during hospitalization.

## 2016-10-03 NOTE — Assessment & Plan Note (Signed)
Stable

## 2016-10-03 NOTE — Assessment & Plan Note (Signed)
BMP Latest Ref Rng & Units 10/02/2016 09/27/2016 09/26/2016  Glucose 70 - 99 mg/dL 81 223(H) 143(H)  BUN 6 - 23 mg/dL 50(H) 51(H) 42(H)  Creatinine 0.40 - 1.20 mg/dL 2.50(H) 2.78(H) 2.67(H)  Sodium 135 - 145 mEq/L 139 134(L) 137  Potassium 3.5 - 5.1 mEq/L 4.8 4.9 4.6  Chloride 96 - 112 mEq/L 110 101 99(L)  CO2 19 - 32 mEq/L 21 24 26   Calcium 8.4 - 10.5 mg/dL 9.3 9.0 9.3

## 2016-10-05 ENCOUNTER — Encounter (HOSPITAL_COMMUNITY): Payer: Self-pay | Admitting: Internal Medicine

## 2016-10-05 NOTE — Progress Notes (Signed)
Mailed patient letter with information about Cardiac Rehab program. MW °

## 2016-10-06 ENCOUNTER — Encounter: Payer: Self-pay | Admitting: Cardiology

## 2016-10-06 ENCOUNTER — Ambulatory Visit (INDEPENDENT_AMBULATORY_CARE_PROVIDER_SITE_OTHER): Payer: Medicare Other | Admitting: Cardiology

## 2016-10-06 VITALS — BP 132/72 | HR 52 | Ht 61.0 in | Wt 123.0 lb

## 2016-10-06 DIAGNOSIS — Z86711 Personal history of pulmonary embolism: Secondary | ICD-10-CM

## 2016-10-06 DIAGNOSIS — I447 Left bundle-branch block, unspecified: Secondary | ICD-10-CM

## 2016-10-06 DIAGNOSIS — N184 Chronic kidney disease, stage 4 (severe): Secondary | ICD-10-CM

## 2016-10-06 DIAGNOSIS — I420 Dilated cardiomyopathy: Secondary | ICD-10-CM

## 2016-10-06 DIAGNOSIS — I214 Non-ST elevation (NSTEMI) myocardial infarction: Secondary | ICD-10-CM | POA: Diagnosis not present

## 2016-10-06 NOTE — Assessment & Plan Note (Signed)
PE 2010. Negative VQ in Jan 2018 and Feb 2018

## 2016-10-06 NOTE — Assessment & Plan Note (Addendum)
Pt had NSTEMI 09/25/16 - Troponin peak 26.8 . Plan is for medical Rx secondary to CRI. She denies any angina since discharge (her presenting symptom was Lt axillary and Lt arm pain)

## 2016-10-06 NOTE — Patient Instructions (Addendum)
Medication Instructions:  Your physician recommends that you continue on your current medications as directed. Please refer to the Current Medication list given to you today.  Labwork: none  Testing/Procedures: none  Follow-Up: Your physician recommends that you schedule a follow-up appointment in: 6 weeks WITH DR [facility]  Any Other Special Instructions Will Be Listed Below (If Applicable). NO STRENUOUS ACTIVITY FOR 2 WEEKS   If you need a refill on your cardiac medications before your next appointment, please call your pharmacy.

## 2016-10-06 NOTE — Assessment & Plan Note (Signed)
[facility] follows, last SCr 2.5

## 2016-10-06 NOTE — Assessment & Plan Note (Signed)
Suspect ischemic and non ischemic CM. Her Last EF Feb 2018 had improved from 15% to 20-25%

## 2016-10-06 NOTE — Assessment & Plan Note (Signed)
With sinus bradycardia on low dose Coreg- asymptomatic

## 2016-10-06 NOTE — Progress Notes (Signed)
10/06/2016 Liliane Channel   Dec 24, 1927  [id number]   Primary Physician Binnie Rail, MD Primary Cardiologist: Dr Percival Spanish  HPI: 83 year old black female has a history of a nonischemic cardiomyopathy since at least 2010 with an EF of 30-35%. She also has diabetes, CRI-3,  Hypertension, and has a prior history of pulmonary embolus in 2010. She was treated with Coumadin for a couple of years and then taken off because of risk. She was admitted in October 2017 with worsening chest pain and had a non-STEMI. At that time she was treated medically secondary to her CRI. HerEF has remained about the same over the years. She was admitted again in Jan 2018 with chest pain and an elevated BNP. Her EF had dropped to 15%. She was diuresed from 127 lbs to 124 lbs. Entresto was added. Her Troponin during that adm was only 0.9.   She was admitted again in Feb 2018 with chest pain and Lt axillary pain. This time she ruled in for a NSTEMI with peak troponin of 27. Again it was decided to forgo cath and treat her medically. He EF in Feb 2018 improved a little to 20-25%. She did have some NST and has a baseline LBBB. Her Coreg was titrated up but she couldn't tolerate 12.5 mg BID secondary to bradycardia.   She is in the office today for follow up. She has done well since discharge, no further Lt axillary pain or Lt arm pain. She denies unusual dyspnea. She asked about increasing her activity level.    Current Outpatient Prescriptions  Medication Sig Dispense Refill  . amLODipine (NORVASC) 10 MG tablet Take 10 mg by mouth at bedtime.    . Ascorbic Acid (VITAMIN C) 1000 MG tablet Take 1,000 mg by mouth daily as needed (FOR IMMUNE).    Marland Kitchen aspirin EC 81 MG tablet Take 1 tablet (81 mg total) by mouth daily.    . Blood Glucose Monitoring Suppl (ONETOUCH VERIO IQ SYSTEM) w/Device KIT Use to check blood sugar 1 time per day. 1 kit 2  . calcitRIOL (ROCALTROL) 0.25 MCG capsule Take 0.25 mcg by mouth daily.    .  carvedilol (COREG) 6.25 MG tablet Take 1 tablet (6.25 mg total) by mouth 2 (two) times daily with a meal. 60 tablet 6  . furosemide (LASIX) 20 MG tablet Take 2 tablets (40 mg total) by mouth daily. 30 tablet 1  . glucose blood (ONETOUCH VERIO) test strip Use to check blood sugar 1 time per day. 100 each 2  . hydrALAZINE (APRESOLINE) 25 MG tablet Take 1 tablet (25 mg total) by mouth 4 (four) times daily. 120 tablet 3  . isosorbide mononitrate (IMDUR) 30 MG 24 hr tablet Take 1 tablet (30 mg total) by mouth daily. 30 tablet 2  . nitroGLYCERIN (NITROSTAT) 0.4 MG SL tablet Place 1 tablet (0.4 mg total) under the tongue every 5 (five) minutes as needed for chest pain. 50 tablet 0  . repaglinide (PRANDIN) 2 MG tablet TAKE 1 TABLET (2 MG TOTAL) BY MOUTH 3 (THREE) TIMES DAILY BEFORE MEALS. 90 tablet 0  . sacubitril-valsartan (ENTRESTO) 24-26 MG Take 1 tablet by mouth 2 (two) times daily. 60 tablet 6   No current facility-administered medications for this visit.     Allergies  Allergen Reactions  . Statins Other (See Comments)    Muscle Aches  . Lisinopril Other (See Comments) and Cough    REACTION: INTOL to ACE's w/ cough  . Penicillins Itching and Rash  Social History   Social History  . Marital status: Widowed    Spouse name: N/A  . Number of children: 1  . Years of education: N/A   Occupational History  . retired Retired   Social History Main Topics  . Smoking status: Former Smoker    Packs/day: 0.30    Years: 36.00    Types: Cigarettes    Quit date: 08/18/1983  . Smokeless tobacco: Never Used  . Alcohol use 0.6 oz/week    1 Glasses of wine per week     Comment: occ wine, beer during holidays or at social events  . Drug use: No  . Sexual activity: Not Currently   Other Topics Concern  . Not on file   Social History Narrative   Widow, 1 daughter     Review of Systems: General: negative for chills, fever, night sweats or weight changes.  Cardiovascular: negative for  chest pain, dyspnea on exertion, edema, orthopnea, palpitations, paroxysmal nocturnal dyspnea or shortness of breath Dermatological: negative for rash Respiratory: negative for cough or wheezing Urologic: negative for hematuria Abdominal: negative for nausea, vomiting, diarrhea, bright red blood per rectum, melena, or hematemesis Neurologic: negative for visual changes, syncope, or dizziness All other systems reviewed and are otherwise negative except as noted above.    Blood pressure 132/72, pulse (!) 52, height '5\' 1"'  (1.549 m), weight 123 lb (55.8 kg).  General appearance: alert, cooperative and no distress Neck: no carotid bruit and no JVD Lungs: clear to auscultation bilaterally Heart: regular rate and rhythm Extremities: no edema Skin: Skin color, texture, turgor normal. No rashes or lesions Neurologic: Grossly normal  EKG NSR, SB, LBBB  ASSESSMENT AND PLAN:   NSTEMI (non-ST elevated myocardial infarction) (HCC) Pt had NSTEMI 09/25/16 - Troponin peak 26.8 . Plan is for medical Rx secondary to CRI. She denies any angina since discharge (her presenting symptom was Lt axillary and Lt arm pain)   CKD (chronic kidney disease), stage IV ([place]) [facility] follows, last SCr 2.5   Dilated cardiomyopathy ([place]) Suspect ischemic and non ischemic CM. Her Last EF Feb 2018 had improved from 15% to 20-25%  History of pulmonary embolism PE 2010. Negative VQ in Jan 2018 and Feb 2018  LBBB (left bundle branch block) With sinus bradycardia on low dose Coreg- asymptomatic   PLAN  I suggested Ms Stambaugh restrict her activities for another two weeks, then increase as tolerated. F/U Dr Percival Spanish in 3 months.  Kerin Ransom PA-C 10/06/2016 11:45 AM

## 2016-10-13 ENCOUNTER — Encounter: Payer: Self-pay | Admitting: Endocrinology

## 2016-10-13 ENCOUNTER — Ambulatory Visit (INDEPENDENT_AMBULATORY_CARE_PROVIDER_SITE_OTHER): Payer: Medicare Other | Admitting: Endocrinology

## 2016-10-13 VITALS — BP 138/86 | HR 73 | Ht 61.0 in | Wt 123.0 lb

## 2016-10-13 DIAGNOSIS — E119 Type 2 diabetes mellitus without complications: Secondary | ICD-10-CM

## 2016-10-13 LAB — POCT GLYCOSYLATED HEMOGLOBIN (HGB A1C): HEMOGLOBIN A1C: 7

## 2016-10-13 NOTE — Patient Instructions (Addendum)
check your blood sugar once a day.  vary the time of day when you check, between before the 3 meals, and at bedtime.  also check if you have symptoms of your blood sugar being too high or too low.  please keep a record of the readings and bring it to your next appointment here.  You can write it on any piece of paper.  please call us sooner if your blood sugar goes below 70, or if you have a lot of readings over 200.  Please continue the same repaglinide.    The pioglitizone will take months to leave your body.  As that happens, your sugar may go up.  Therefore, you should still see [name], to learn about drawing insulin from the bottle, and injecting, just in case.   Please come back for a follow-up appointment in 2 months.

## 2016-10-13 NOTE — Progress Notes (Signed)
Subjective:    Patient ID: [name], female    DOB: 1928/07/23, 83 y.o.   MRN: [id number]  HPI Pt returns for f/u of diabetes mellitus: DM type: 2 Dx'ed: 7322 Complications: polyneuropathy, renal insufficiency, TIA, and CAD.   Therapy: repaglinide.  GDM: never.   DKA: never.   Severe hypoglycemia: never.   Pancreatitis: never.  Other: she took insulin 2014-2015; oral rx options are limited by renal insufficiency, CHF, and her request for generic meds.  Interval history: she missed DM educ appt, due to hospitalization.  She says cbg's are well-controlled.   Past Medical History:  Diagnosis Date  . Anxiety   . Benign neoplasm of adrenal gland   . CAD (coronary artery disease)    a. nonobstructive disease by cath in 2010. b. NSTEMI 05/2016: cath not performed secondary to Stage 4 CKD  . Calculus of kidney   . CHF (congestive heart failure) ([place])   . Diabetes mellitus    hx noncompliance with rx'd tx  . Diverticulosis of colon (without mention of hemorrhage)   . DJD (degenerative joint disease)   . GERD (gastroesophageal reflux disease)    hx DU with H pylori 01/2009 and GIB, s/p triple rx  . GI bleed 2012   duoedonal ulcer  . H. pylori infection   . Hx of subarachnoid hemorrhage 2001  . Hypercholesterolemia   . Hypertension   . LBBB (left bundle branch block)   . Nonischemic cardiomyopathy ([place])    hypertensive  . Nontoxic multinodular goiter   . Paroxysmal atrial fibrillation (HCC)    not anticoag candidate  . Pulmonary embolism ([place]) 02/2009   on anticoag thru 11/2010 - stopped due to risk>benefit  . Pure hypercholesterolemia   . Tubulovillous adenoma of colon 2012    Past Surgical History:  Procedure Laterality Date  . CATARACT EXTRACTION    . KNEE SURGERY     left  . SHOULDER SURGERY     right  . TONSILLECTOMY      Social History   Social History  . Marital status: Widowed    Spouse name: N/A  . Number of children: 1  . Years of education: N/A    Occupational History  . retired Retired   Social History Main Topics  . Smoking status: Former Smoker    Packs/day: 0.30    Years: 36.00    Types: Cigarettes    Quit date: 08/18/1983  . Smokeless tobacco: Never Used  . Alcohol use 0.6 oz/week    1 Glasses of wine per week     Comment: occ wine, beer during holidays or at social events  . Drug use: No  . Sexual activity: Not Currently   Other Topics Concern  . Not on file   Social History Narrative   Widow, 1 daughter    Current Outpatient Prescriptions on File Prior to Visit  Medication Sig Dispense Refill  . amLODipine (NORVASC) 10 MG tablet Take 10 mg by mouth at bedtime.    . Ascorbic Acid (VITAMIN C) 1000 MG tablet Take 1,000 mg by mouth daily as needed (FOR IMMUNE).    Marland Kitchen aspirin EC 81 MG tablet Take 1 tablet (81 mg total) by mouth daily.    . Blood Glucose Monitoring Suppl (ONETOUCH VERIO IQ SYSTEM) w/Device KIT Use to check blood sugar 1 time per day. 1 kit 2  . calcitRIOL (ROCALTROL) 0.25 MCG capsule Take 0.25 mcg by mouth daily.    . carvedilol (COREG) 6.25 MG  tablet Take 1 tablet (6.25 mg total) by mouth 2 (two) times daily with a meal. 60 tablet 6  . furosemide (LASIX) 20 MG tablet Take 2 tablets (40 mg total) by mouth daily. 30 tablet 1  . glucose blood (ONETOUCH VERIO) test strip Use to check blood sugar 1 time per day. 100 each 2  . hydrALAZINE (APRESOLINE) 25 MG tablet Take 1 tablet (25 mg total) by mouth 4 (four) times daily. 120 tablet 3  . isosorbide mononitrate (IMDUR) 30 MG 24 hr tablet Take 1 tablet (30 mg total) by mouth daily. 30 tablet 2  . nitroGLYCERIN (NITROSTAT) 0.4 MG SL tablet Place 1 tablet (0.4 mg total) under the tongue every 5 (five) minutes as needed for chest pain. 50 tablet 0  . repaglinide (PRANDIN) 2 MG tablet TAKE 1 TABLET (2 MG TOTAL) BY MOUTH 3 (THREE) TIMES DAILY BEFORE MEALS. 90 tablet 0  . sacubitril-valsartan (ENTRESTO) 24-26 MG Take 1 tablet by mouth 2 (two) times daily. 60 tablet 6    No current facility-administered medications on file prior to visit.     Allergies  Allergen Reactions  . Statins Other (See Comments)    Muscle Aches  . Lisinopril Other (See Comments) and Cough    REACTION: INTOL to ACE's w/ cough  . Penicillins Itching and Rash    Family History  Problem Relation Age of Onset  . Lung cancer Mother   . Brain cancer Mother   . Goiter Mother   . Cancer Mother   . Heart disease Mother   . Diabetes Mother   . Diabetes Maternal Grandmother   . Heart disease Maternal Aunt     x2  . Colon cancer Neg Hx     BP 138/86   Pulse 73   Ht _0  (1.549 m)   Wt 123 lb (55.8 kg)   SpO2 93%   BMI 23.24 kg/m    Review of Systems She denies hypoglycemia.      Objective:   Physical Exam VITAL SIGNS:  See vs page GENERAL: no distress Pulses: dorsalis pedis intact bilat.   MSK: no deformity of the feet.  CV: trace bilat leg edema.  Skin:  no ulcer on the feet.  normal color and temp on the feet. Neuro: sensation is intact to touch on the feet.    A1c=7.0%    Assessment & Plan:  Type 2 DM: well-controlled, with renal insufficiency.  well-controlled.     Patient is advised the following: Patient Instructions    check your blood sugar once a day.  vary the time of day when you check, between before the 3 meals, and at bedtime.  also check if you have symptoms of your blood sugar being too high or too low.  please keep a record of the readings and bring it to your next appointment here.  You can write it on any piece of paper.  please call us sooner if your blood sugar goes below 70, or if you have a lot of readings over 200.  Please continue the same repaglinide.   The pioglitizone will take months to leave your body.  As that happens, your sugar may go up.  Therefore, you should still see [name], to learn about drawing insulin from the bottle, and injecting, just in case.   Please come back for a follow-up appointment in 2 months.

## 2016-10-19 DIAGNOSIS — N184 Chronic kidney disease, stage 4 (severe): Secondary | ICD-10-CM | POA: Diagnosis not present

## 2016-10-20 ENCOUNTER — Encounter: Payer: Self-pay | Admitting: Internal Medicine

## 2016-10-20 ENCOUNTER — Telehealth: Payer: Self-pay | Admitting: Cardiology

## 2016-10-20 ENCOUNTER — Ambulatory Visit (INDEPENDENT_AMBULATORY_CARE_PROVIDER_SITE_OTHER): Payer: Medicare Other | Admitting: Internal Medicine

## 2016-10-20 VITALS — BP 140/90 | HR 74 | Resp 16 | Wt 125.0 lb

## 2016-10-20 DIAGNOSIS — I25119 Atherosclerotic heart disease of native coronary artery with unspecified angina pectoris: Secondary | ICD-10-CM

## 2016-10-20 DIAGNOSIS — I10 Essential (primary) hypertension: Secondary | ICD-10-CM

## 2016-10-20 DIAGNOSIS — I5022 Chronic systolic (congestive) heart failure: Secondary | ICD-10-CM | POA: Diagnosis not present

## 2016-10-20 DIAGNOSIS — N184 Chronic kidney disease, stage 4 (severe): Secondary | ICD-10-CM

## 2016-10-20 DIAGNOSIS — E119 Type 2 diabetes mellitus without complications: Secondary | ICD-10-CM

## 2016-10-20 NOTE — Patient Instructions (Signed)
Call cardiology today and update them about your left arm pain.   Medications reviewed and updated.   No changes recommended at this time.   Please followup in 6 months

## 2016-10-20 NOTE — Telephone Encounter (Signed)
Could not reach patient when dialed. No answer/air dropped.

## 2016-10-20 NOTE — Progress Notes (Signed)
Subjective:    Patient ID: [name], female    DOB: [birth year], 83 y.o.   MRN: [id number]  HPI The patient is here for follow up.  CAD, NSTEMI, cardiomyopathy, PAfib: She denies chest pain, but is having left arm pain.  She gets SOB with exertion. She is taking her medication as prescribed.    Her left arm is hurting - it goes from the down the arm into the hand. It is intermittent.  She feels it when her heart races.  It can come at rest. She deneies neck pain, numbness/tingling in her arm. She has tried taking the NTG and it does help.  She was not having the arm pain when she saw cardiology last month.  She denies pain today.    Diabetes:  Her recent a1c is 7.0.  She is eating healthy.  She is not able to exercise.  She is taking her medication as prescribed.   Hypertension: She is taking her medication daily. She is compliant with a low sodium diet.  She denies chest pain, edema, and regular headaches. She is not exercising regularly.  She does not monitor her blood pressure at home.        Medications and allergies reviewed with patient and updated if appropriate.  Patient Active Problem List   Diagnosis Date Noted  . Dilated cardiomyopathy ([place]) 10/06/2016  . LBBB (left bundle branch block) 10/06/2016  . NSTEMI (non-ST elevated myocardial infarction) ([place]) 09/25/2016  . History of pulmonary embolism 09/24/2016  . SOB (shortness of breath) 09/08/2016  . Bilateral leg edema 09/08/2016  . Chronic systolic heart failure ([place]) 09/08/2016  . Symptomatic bradycardia 05/31/2016  . Thrombocytopenia ([place]) 05/31/2016  . Neuropathy ([place]) 04/30/2016  . Diabetes mellitus type 2 in nonobese ([place]) 12/25/2015  . Chest pain 03/22/2015  . PAROXYSMAL ATRIAL FIBRILLATION 09/30/2009  . CKD (chronic kidney disease), stage IV ([place]) 08/05/2009  . GOITER, MULTINODULAR 05/11/2009  . PEPTIC ULCER DISEASE, HELICOBACTER PYLORI POSITIVE 03/11/2009  . Coronary atherosclerosis 11/29/2008    . BENIGN NEOPLASM OF ADRENAL GLAND 06/20/2008  . DIVERTICULOSIS OF COLON 06/20/2008  . ARTERIOVENOUS MALFORMATION 06/20/2008  . HYPERCHOLESTEROLEMIA 05/10/2008  . Essential hypertension 05/10/2008  . TRANSIENT ISCHEMIC ATTACK 05/10/2008  . NEPHROLITHIASIS 05/10/2008    Current Outpatient Prescriptions on File Prior to Visit  Medication Sig Dispense Refill  . amLODipine (NORVASC) 10 MG tablet Take 10 mg by mouth at bedtime.    . Ascorbic Acid (VITAMIN C) 1000 MG tablet Take 1,000 mg by mouth daily as needed (FOR IMMUNE).    Marland Kitchen aspirin EC 81 MG tablet Take 1 tablet (81 mg total) by mouth daily.    . Blood Glucose Monitoring Suppl (ONETOUCH VERIO IQ SYSTEM) w/Device KIT Use to check blood sugar 1 time per day. 1 kit 2  . calcitRIOL (ROCALTROL) 0.25 MCG capsule Take 0.25 mcg by mouth daily.    . carvedilol (COREG) 6.25 MG tablet Take 1 tablet (6.25 mg total) by mouth 2 (two) times daily with a meal. 60 tablet 6  . furosemide (LASIX) 20 MG tablet Take 2 tablets (40 mg total) by mouth daily. 30 tablet 1  . glucose blood (ONETOUCH VERIO) test strip Use to check blood sugar 1 time per day. 100 each 2  . hydrALAZINE (APRESOLINE) 25 MG tablet Take 1 tablet (25 mg total) by mouth 4 (four) times daily. 120 tablet 3  . isosorbide mononitrate (IMDUR) 30 MG 24 hr tablet Take 1 tablet (30 mg total) by  mouth daily. 30 tablet 2  . nitroGLYCERIN (NITROSTAT) 0.4 MG SL tablet Place 1 tablet (0.4 mg total) under the tongue every 5 (five) minutes as needed for chest pain. 50 tablet 0  . repaglinide (PRANDIN) 2 MG tablet TAKE 1 TABLET (2 MG TOTAL) BY MOUTH 3 (THREE) TIMES DAILY BEFORE MEALS. 90 tablet 0  . sacubitril-valsartan (ENTRESTO) 24-26 MG Take 1 tablet by mouth 2 (two) times daily. 60 tablet 6   No current facility-administered medications on file prior to visit.     Past Medical History:  Diagnosis Date  . Anxiety   . Benign neoplasm of adrenal gland   . CAD (coronary artery disease)    a.  nonobstructive disease by cath in 2010. b. NSTEMI 05/2016: cath not performed secondary to Stage 4 CKD  . Calculus of kidney   . CHF (congestive heart failure) ([place])   . Diabetes mellitus    hx noncompliance with rx'd tx  . Diverticulosis of colon (without mention of hemorrhage)   . DJD (degenerative joint disease)   . GERD (gastroesophageal reflux disease)    hx DU with H pylori 01/2009 and GIB, s/p triple rx  . GI bleed 2012   duoedonal ulcer  . H. pylori infection   . Hx of subarachnoid hemorrhage 2001  . Hypercholesterolemia   . Hypertension   . LBBB (left bundle branch block)   . Nonischemic cardiomyopathy ([place])    hypertensive  . Nontoxic multinodular goiter   . Paroxysmal atrial fibrillation (HCC)    not anticoag candidate  . Pulmonary embolism ([place]) 02/2009   on anticoag thru 11/2010 - stopped due to risk>benefit  . Pure hypercholesterolemia   . Tubulovillous adenoma of colon 2012    Past Surgical History:  Procedure Laterality Date  . CATARACT EXTRACTION    . KNEE SURGERY     left  . SHOULDER SURGERY     right  . TONSILLECTOMY      Social History   Social History  . Marital status: Widowed    Spouse name: N/A  . Number of children: 1  . Years of education: N/A   Occupational History  . retired Retired   Social History Main Topics  . Smoking status: Former Smoker    Packs/day: 0.30    Years: 36.00    Types: Cigarettes    Quit date: 08/18/1983  . Smokeless tobacco: Never Used  . Alcohol use 0.6 oz/week    1 Glasses of wine per week     Comment: occ wine, beer during holidays or at social events  . Drug use: No  . Sexual activity: Not Currently   Other Topics Concern  . None   Social History Narrative   Widow, 1 daughter    Family History  Problem Relation Age of Onset  . Lung cancer Mother   . Brain cancer Mother   . Goiter Mother   . Cancer Mother   . Heart disease Mother   . Diabetes Mother   . Diabetes Maternal Grandmother   . Heart  disease Maternal Aunt     x2  . Colon cancer Neg Hx     Review of Systems  Constitutional: Negative for fever.  HENT: Positive for sneezing.   Respiratory: Positive for shortness of breath (with exertion). Negative for cough and wheezing.   Cardiovascular: Positive for palpitations (intermittent). Negative for chest pain (having left arm pain - likely ischemia) and leg swelling.       Objective:  Vitals:   10/20/16 1058  BP: 140/90  Pulse: 74  Resp: 16   Wt Readings from Last 3 Encounters:  10/20/16 125 lb (56.7 kg)  10/13/16 123 lb (55.8 kg)  10/06/16 123 lb (55.8 kg)   Body mass index is 23.62 kg/m.   Physical Exam    Constitutional: Appears well-developed and well-nourished. No distress.  HENT:  Head: Normocephalic and atraumatic.  Neck: Neck supple. No tracheal deviation present. No thyromegaly present.  No cervical lymphadenopathy Cardiovascular: Normal rate, regular rhythm.  S1, S2, S3   No murmur heard. No carotid bruit .  No edema Pulmonary/Chest: Effort normal and breath sounds normal. No respiratory distress. No has no wheezes. No rales.  Msk: no left arm pain with palpation Skin: Skin is warm and dry. Not diaphoretic.  Psychiatric: Normal mood and affect. Behavior is normal.      Assessment & Plan:    See Problem List for Assessment and Plan of chronic medical problems.

## 2016-10-20 NOTE — Telephone Encounter (Signed)
Follow up   Pt calling back states that she could not get to the phone in time

## 2016-10-20 NOTE — Progress Notes (Signed)
Pre visit review using our clinic review tool, if applicable. No additional management support is needed unless otherwise documented below in the visit note. 

## 2016-10-20 NOTE — Telephone Encounter (Signed)
New Message  Pt voiced needing to speak with nurse in regard to her PCP f/u this morning.  Pt voiced she keeps getting pain in left arm and when she takes nitroglycerin under her tongue and it alleviates.  PCP advised pt to give [place] a call.

## 2016-10-21 NOTE — Assessment & Plan Note (Signed)
Not fluid overloaded on exam Continue current meds Following with cardiology

## 2016-10-21 NOTE — Assessment & Plan Note (Signed)
Lab Results  Component Value Date   HGBA1C 7.0 10/13/2016   Following with Dr Loanne Drilling - management per him Continue current medication

## 2016-10-21 NOTE — Telephone Encounter (Signed)
Left msg to call.

## 2016-10-21 NOTE — Assessment & Plan Note (Signed)
Saw [facility] recently and had blood work done Stable overall

## 2016-10-21 NOTE — Assessment & Plan Note (Signed)
BP controlled Current regimen effective and well tolerated Continue current medications at current doses 

## 2016-10-21 NOTE — Telephone Encounter (Signed)
Spoke with pt, on Monday of this week she had 2 episodes of pain that were relieved with NTG. The first episode she was walking to the bathroom and her heart started racing and then she developed pain in the left outer breast that went into the upper arm, down to the elbow and into the palm of her hand. After 20 to 30 mins she took 1 NTG and it went away. Later that same day she developed pain again and that was also relieved with 1 NTG. She has had no further pain since Monday. She was seen by her PCP on Tuesday and was told to call. She denies SOB, edema or weight gain. She reports this is the same type pain she had in feb prior to her MI. Follow up scheduled with dr hochrein next week at her convenience. Patient voiced understanding to call 911 and go to the hospital with pain that is not relieved with NTG.

## 2016-10-21 NOTE — Assessment & Plan Note (Signed)
Having left arm pain - likely angina - relieved with NTG Will follow up with cardiology today  She did not tolerated a higher dose of Imdur due to headaches No medication changes - see what cardio thinks

## 2016-10-22 ENCOUNTER — Encounter: Payer: Self-pay | Admitting: Cardiology

## 2016-10-27 ENCOUNTER — Other Ambulatory Visit: Payer: Self-pay | Admitting: Cardiology

## 2016-10-27 NOTE — Telephone Encounter (Signed)
Rx(s) sent to pharmacy electronically.  

## 2016-10-28 NOTE — Progress Notes (Signed)
Cardiology Office Note   Date:  10/29/2016   ID:  [name], DOB 1927/11/06, MRN [id number]  PCP:  Binnie Rail, MD  Cardiologist:   Minus Breeding, MD   No chief complaint on file.     History of Present Illness: [name] is a 83 y.o. female who presents for follow up of unstable angina with recent hospitalization for NSTEMI.  She has an ischemic cardiomyopathy.  She has been managed medically secondary to CKD.   She called last week with continued chest pain.  She was offered an appt but deferred until today.  When she called she's been having left arm discomfort. This would also occur under her left breast. It would happen when her heart would race which was when she would be doing something active. It was really the first time she had any discomfort since her hospitalization. She had some this morning when she was moving around as well. She doesn't have any at rest. Similar to the angina that she had in the hospital but not as intense. It goes away with rest. She hasn't taken nitroglycerin recently for this. She denies any associated symptoms such as nausea vomiting or diaphoresis. She's had no new shortness of breath, PND or orthopnea. She's had no palpitations, presyncope or syncope.   Past Medical History:  Diagnosis Date  . Anxiety   . Benign neoplasm of adrenal gland   . CAD (coronary artery disease)    a. nonobstructive disease by cath in 2010. b. NSTEMI 05/2016: cath not performed secondary to Stage 4 CKD  . Calculus of kidney   . CHF (congestive heart failure) ([place])   . Diabetes mellitus    hx noncompliance with rx'd tx  . Diverticulosis of colon (without mention of hemorrhage)   . DJD (degenerative joint disease)   . GERD (gastroesophageal reflux disease)    hx DU with H pylori 01/2009 and GIB, s/p triple rx  . GI bleed 2012   duoedonal ulcer  . H. pylori infection   . Hx of subarachnoid hemorrhage 2001  . Hypercholesterolemia   . Hypertension     . LBBB (left bundle branch block)   . Nonischemic cardiomyopathy ([place])    hypertensive  . Nontoxic multinodular goiter   . Paroxysmal atrial fibrillation (HCC)    not anticoag candidate  . Pulmonary embolism ([place]) 02/2009   on anticoag thru 11/2010 - stopped due to risk>benefit  . Pure hypercholesterolemia   . Tubulovillous adenoma of colon 2012    Past Surgical History:  Procedure Laterality Date  . CATARACT EXTRACTION    . KNEE SURGERY     left  . SHOULDER SURGERY     right  . TONSILLECTOMY       Current Outpatient Prescriptions  Medication Sig Dispense Refill  . Ascorbic Acid (VITAMIN C) 1000 MG tablet Take 1,000 mg by mouth daily as needed (FOR IMMUNE).    Marland Kitchen aspirin EC 81 MG tablet Take 1 tablet (81 mg total) by mouth daily.    . Blood Glucose Monitoring Suppl (ONETOUCH VERIO IQ SYSTEM) w/Device KIT Use to check blood sugar 1 time per day. 1 kit 2  . calcitRIOL (ROCALTROL) 0.25 MCG capsule Take 0.25 mcg by mouth daily.    . carvedilol (COREG) 6.25 MG tablet Take 1 tablet (6.25 mg total) by mouth 2 (two) times daily with a meal. 60 tablet 6  . furosemide (LASIX) 20 MG tablet TAKE 1 TABLET (20 MG TOTAL) BY  MOUTH DAILY. 30 tablet 9  . glucose blood (ONETOUCH VERIO) test strip Use to check blood sugar 1 time per day. 100 each 2  . hydrALAZINE (APRESOLINE) 25 MG tablet Take 1 tablet (25 mg total) by mouth 4 (four) times daily. 120 tablet 3  . isosorbide mononitrate (IMDUR) 30 MG 24 hr tablet Take 1 tablet (30 mg total) by mouth daily. 30 tablet 2  . nitroGLYCERIN (NITROSTAT) 0.4 MG SL tablet Place 1 tablet (0.4 mg total) under the tongue every 5 (five) minutes as needed for chest pain. 50 tablet 0  . repaglinide (PRANDIN) 2 MG tablet TAKE 1 TABLET (2 MG TOTAL) BY MOUTH 3 (THREE) TIMES DAILY BEFORE MEALS. 90 tablet 0  . sacubitril-valsartan (ENTRESTO) 24-26 MG Take 1 tablet by mouth 2 (two) times daily. 60 tablet 6  . amLODipine (NORVASC) 10 MG tablet Take 1 tablet (10 mg total) by  mouth at bedtime. 90 tablet 1   No current facility-administered medications for this visit.     Allergies:   Statins; Lisinopril; and Penicillins    ROS:  Please see the history of present illness.   Otherwise, review of systems are positive for none.   All other systems are reviewed and negative.    PHYSICAL EXAM: VS:  BP 126/68 (BP Location: Left Arm, Patient Position: Sitting, Cuff Size: Normal)   Pulse 80   Ht _0  (1.549 m)   Wt 127 lb (57.6 kg)   BMI 24.00 kg/m  , BMI Body mass index is 24 kg/m. GENERAL:  Well appearing, no distress NECK:  No jugular venous distention, waveform within normal limits, carotid upstroke brisk and symmetric, no bruits, no thyromegaly LYMPHATICS:  No cervical, inguinal adenopathy LUNGS:  Clear to auscultation bilaterally BACK:  No CVA tenderness CHEST:  Unremarkable HEART:  PMI not displaced or sustained,S1 and S2 within normal limits, no S3, no S4, no clicks, no rubs, no murmurs ABD:  Flat, positive bowel sounds normal in frequency in pitch, no bruits, no rebound, no guarding, no midline pulsatile mass, no hepatomegaly, no splenomegaly EXT:  2 plus pulses throughout, no edema, no cyanosis no clubbing    EKG:  EKG is ordered today. The ekg ordered today demonstrates sinus rhythm, rate 80, rightward axis, premature ectopic complexes, left bundle branch block.   Recent Labs: 07/20/2016: TSH 1.44 09/08/2016: ALT 31 09/24/2016: B Natriuretic Peptide 1,016.2; Hemoglobin 13.5; Platelets 166 10/02/2016: BUN 50; Creatinine, Ser 2.50; Potassium 4.8; Pro B Natriuretic peptide (BNP) 666.0; Sodium 139    Lipid Panel    Component Value Date/Time   CHOL 215 (H) 07/20/2016 1206   TRIG 92.0 07/20/2016 1206   HDL 95.10 07/20/2016 1206   CHOLHDL 2 07/20/2016 1206   VLDL 18.4 07/20/2016 1206   LDLCALC 101 (H) 07/20/2016 1206   LDLDIRECT 135.7 04/26/2013 1008      Wt Readings from Last 3 Encounters:  10/29/16 127 lb (57.6 kg)  10/20/16 125 lb (56.7  kg)  10/13/16 123 lb (55.8 kg)      Other studies Reviewed: Additional studies/ records that were reviewed today include: Hospital records. Review of the above records demonstrates:  Please see elsewhere in the note.    Lab Results  Component Value Date   HGBA1C 7.0 10/13/2016     ASSESSMENT AND PLAN:  Exertional angina:   She continues to have some chest discomfort particularly with activity. We discussed at length the risk of myocardial infarction and the possibility of a diagnostic cardiac catheterization. At this  point she's not sure she wants to have a catheterization because of the possibility of renal failure. This very much worries her. We compromised that she would take increased dose of beta blocker and Imdur but if she fails medical management with this she would let me know and would agree to cardiac catheterization.   CKD (chronic kidney disease), stage IV ([place]) [facility] follows, last SCr 2.5. She's followed by nephrology. Certainly if we do catheterize her she would need to be in the hospital the day before for gentle hydration.   Ischemic cardiomyopathy ([place]) Suspect ischemic and non ischemic CM. Her Last EF Feb 2018 had improved from 15% to 20-25%.  She seems to be euvolemic.   LBBB (left bundle branch block) With sinus bradycardia on low dose Coreg- asymptomatic  DM Her last A1C is as above.  She will continue on the meds as listed.   Current medicines are reviewed at length with the patient today.  The patient does not have concerns regarding medicines.  The following changes have been made:  As above  Labs/ tests ordered today include: None No orders of the defined types were placed in this encounter.    Disposition:   FU with me in two weeks.     Signed, Minus Breeding, MD  10/29/2016 12:24 PM    [facility]

## 2016-10-29 ENCOUNTER — Encounter: Payer: Self-pay | Admitting: Cardiology

## 2016-10-29 ENCOUNTER — Ambulatory Visit (INDEPENDENT_AMBULATORY_CARE_PROVIDER_SITE_OTHER): Payer: Medicare Other | Admitting: Cardiology

## 2016-10-29 ENCOUNTER — Other Ambulatory Visit: Payer: Self-pay | Admitting: *Deleted

## 2016-10-29 VITALS — BP 126/68 | HR 80 | Ht 61.0 in | Wt 127.0 lb

## 2016-10-29 DIAGNOSIS — N184 Chronic kidney disease, stage 4 (severe): Secondary | ICD-10-CM | POA: Diagnosis not present

## 2016-10-29 DIAGNOSIS — E118 Type 2 diabetes mellitus with unspecified complications: Secondary | ICD-10-CM | POA: Diagnosis not present

## 2016-10-29 DIAGNOSIS — I25118 Atherosclerotic heart disease of native coronary artery with other forms of angina pectoris: Secondary | ICD-10-CM | POA: Diagnosis not present

## 2016-10-29 DIAGNOSIS — I255 Ischemic cardiomyopathy: Secondary | ICD-10-CM

## 2016-10-29 DIAGNOSIS — I5022 Chronic systolic (congestive) heart failure: Secondary | ICD-10-CM

## 2016-10-29 MED ORDER — CARVEDILOL 12.5 MG PO TABS: 12.5000 mg | ORAL_TABLET | Freq: Two times a day (BID) | ORAL | 11 refills | Status: AC

## 2016-10-29 MED ORDER — ISOSORBIDE MONONITRATE ER 120 MG PO TB24: 120.0000 mg | ORAL_TABLET | Freq: Every day | ORAL | 11 refills | Status: AC

## 2016-10-29 MED ORDER — AMLODIPINE BESYLATE 10 MG PO TABS: 10.0000 mg | ORAL_TABLET | Freq: Every day | ORAL | 1 refills | Status: AC

## 2016-10-29 NOTE — Patient Instructions (Signed)
Medication Instructions:  INCREASE- Carvedilol 12.5 mg twice a day and Isosorbide 120 mg daily  Labwork: None Ordered  Testing/Procedures: None Ordered  Follow-Up: Your physician recommends that you schedule a follow-up appointment in: 2 Weeks   Any Other Special Instructions Will Be Listed Below (If Applicable).   If you need a refill on your cardiac medications before your next appointment, please call your pharmacy.

## 2016-11-02 DIAGNOSIS — R809 Proteinuria, unspecified: Secondary | ICD-10-CM | POA: Diagnosis not present

## 2016-11-02 DIAGNOSIS — I10 Essential (primary) hypertension: Secondary | ICD-10-CM | POA: Diagnosis not present

## 2016-11-02 DIAGNOSIS — I509 Heart failure, unspecified: Secondary | ICD-10-CM | POA: Diagnosis not present

## 2016-11-02 DIAGNOSIS — N184 Chronic kidney disease, stage 4 (severe): Secondary | ICD-10-CM | POA: Diagnosis not present

## 2016-11-02 DIAGNOSIS — N2581 Secondary hyperparathyroidism of renal origin: Secondary | ICD-10-CM | POA: Diagnosis not present

## 2016-11-09 ENCOUNTER — Telehealth: Payer: Self-pay | Admitting: Cardiology

## 2016-11-09 NOTE — Telephone Encounter (Signed)
Received records from [facility] for appointment on 11/13/16 with Dr Percival Spanish.  Records put with Dr Hochrein's schedule for 11/13/16. lp

## 2016-11-12 NOTE — Progress Notes (Signed)
Cardiology Office Note   Date:  11/15/2016   ID:  [name], DOB 1927/12/11, MRN [id number]  PCP:  Binnie Rail, MD  Cardiologist:   Minus Breeding, MD   Chief Complaint  Patient presents with  . Chest Pain      History of Present Illness: [name] is a 83 y.o. female who presents for follow up of unstable angina with recent hospitalization for NSTEMI.  She has an ischemic cardiomyopathy.  She has been managed medically secondary to CKD.  At the last visit she had continued chest pain and I increased her Imdur.  Since then she is doing much better.  She has had no further chest pain.  She has not been active.  The patient denies any new symptoms such as chest discomfort, neck or arm discomfort. There has been no new shortness of breath, PND or orthopnea. There have been no reported palpitations, presyncope or syncope.  Past Medical History:  Diagnosis Date  . Anxiety   . Benign neoplasm of adrenal gland   . CAD (coronary artery disease)    a. nonobstructive disease by cath in 2010. b. NSTEMI 05/2016: cath not performed secondary to Stage 4 CKD  . Calculus of kidney   . CHF (congestive heart failure) ([place])   . Diabetes mellitus    hx noncompliance with rx'd tx  . Diverticulosis of colon (without mention of hemorrhage)   . DJD (degenerative joint disease)   . GERD (gastroesophageal reflux disease)    hx DU with H pylori 01/2009 and GIB, s/p triple rx  . GI bleed 2012   duoedonal ulcer  . H. pylori infection   . Hx of subarachnoid hemorrhage 2001  . Hypercholesterolemia   . Hypertension   . LBBB (left bundle branch block)   . Nonischemic cardiomyopathy ([place])    hypertensive  . Nontoxic multinodular goiter   . Paroxysmal atrial fibrillation (HCC)    not anticoag candidate  . Pulmonary embolism ([place]) 02/2009   on anticoag thru 11/2010 - stopped due to risk>benefit  . Pure hypercholesterolemia   . Tubulovillous adenoma of colon 2012    Past Surgical  History:  Procedure Laterality Date  . CATARACT EXTRACTION    . KNEE SURGERY     left  . SHOULDER SURGERY     right  . TONSILLECTOMY       Current Outpatient Prescriptions  Medication Sig Dispense Refill  . amLODipine (NORVASC) 10 MG tablet Take 1 tablet (10 mg total) by mouth at bedtime. 90 tablet 1  . Ascorbic Acid (VITAMIN C) 1000 MG tablet Take 1,000 mg by mouth daily as needed (FOR IMMUNE).    Marland Kitchen aspirin EC 81 MG tablet Take 1 tablet (81 mg total) by mouth daily.    . Blood Glucose Monitoring Suppl (ONETOUCH VERIO IQ SYSTEM) w/Device KIT Use to check blood sugar 1 time per day. 1 kit 2  . calcitRIOL (ROCALTROL) 0.25 MCG capsule Take 0.25 mcg by mouth daily.    . carvedilol (COREG) 12.5 MG tablet Take 1 tablet (12.5 mg total) by mouth 2 (two) times daily with a meal. 60 tablet 11  . furosemide (LASIX) 20 MG tablet TAKE 1 TABLET (20 MG TOTAL) BY MOUTH DAILY. 30 tablet 9  . glucose blood (ONETOUCH VERIO) test strip Use to check blood sugar 1 time per day. 100 each 2  . hydrALAZINE (APRESOLINE) 25 MG tablet Take 1 tablet (25 mg total) by mouth 4 (four) times daily.  120 tablet 3  . isosorbide mononitrate (IMDUR) 120 MG 24 hr tablet Take 1 tablet (120 mg total) by mouth daily. 30 tablet 11  . nitroGLYCERIN (NITROSTAT) 0.4 MG SL tablet Place 1 tablet (0.4 mg total) under the tongue every 5 (five) minutes as needed for chest pain. 50 tablet 0  . repaglinide (PRANDIN) 2 MG tablet TAKE 1 TABLET (2 MG TOTAL) BY MOUTH 3 (THREE) TIMES DAILY BEFORE MEALS. 90 tablet 0  . sacubitril-valsartan (ENTRESTO) 24-26 MG Take 1 tablet by mouth 2 (two) times daily. 60 tablet 6   No current facility-administered medications for this visit.     Allergies:   Statins; Lisinopril; and Penicillins    ROS:  Please see the history of present illness.   Otherwise, review of systems are positive for none.   All other systems are reviewed and negative.    PHYSICAL EXAM: VS:  BP (!) 156/88   Pulse 69   Ht 5'  (1.524 m)   Wt 125 lb 3.2 oz (56.8 kg)   BMI 24.45 kg/m  , BMI Body mass index is 24.45 kg/m. GENERAL:  Well appearing, no distress, looks younger than stated age.  NECK:  No jugular venous distention, waveform within normal limits, carotid upstroke brisk and symmetric, no bruits, no thyromegaly LYMPHATICS:  No cervical, inguinal adenopathy LUNGS:  Clear to auscultation bilaterally BACK:  No CVA tenderness CHEST:  Unremarkable HEART:  PMI not displaced or sustained,S1 and S2 within normal limits, no S3, no S4, no clicks, no rubs,  no murmurs ABD:  Flat, positive bowel sounds normal in frequency in pitch, no bruits, no rebound, no guarding, no midline pulsatile mass, no hepatomegaly, no splenomegaly EXT:  2 plus pulses throughout, no edema, no cyanosis no clubbing    EKG:  EKG is not ordered today.    Recent Labs: 07/20/2016: TSH 1.44 09/08/2016: ALT 31 09/24/2016: B Natriuretic Peptide 1,016.2; Hemoglobin 13.5; Platelets 166 10/02/2016: BUN 50; Creatinine, Ser 2.50; Potassium 4.8; Pro B Natriuretic peptide (BNP) 666.0; Sodium 139    Lipid Panel    Component Value Date/Time   CHOL 215 (H) 07/20/2016 1206   TRIG 92.0 07/20/2016 1206   HDL 95.10 07/20/2016 1206   CHOLHDL 2 07/20/2016 1206   VLDL 18.4 07/20/2016 1206   LDLCALC 101 (H) 07/20/2016 1206   LDLDIRECT 135.7 04/26/2013 1008      Wt Readings from Last 3 Encounters:  11/13/16 125 lb 3.2 oz (56.8 kg)  10/29/16 127 lb (57.6 kg)  10/20/16 125 lb (56.7 kg)      Other studies Reviewed: Additional studies/ records that were reviewed today include:     Renal records Review of the above records demonstrates:  Please see elsewhere in the note.    Lab Results  Component Value Date   HGBA1C 7.0 10/13/2016     ASSESSMENT AND PLAN:  Exertional angina:   She and I have had long discussions about this.  She wants to avoid cath.  She is not having symptoms.  No change in therapy is planned.  She will let me know if she has  further pain.    CKD (chronic kidney disease), stage IV ([place]) I reviewed the latest note from renal.  No change in her therapy.    HTN Her blood pressure is very mildly elevated.  No change in therapy is planned.   Ischemic cardiomyopathy ([place])  Suspect ischemic and non ischemic CM. Her Last EF Feb 2018 had improved from 15% to 20-25%.  She seems to be euvolemic.  I will make no change.   LBBB (left bundle branch block) Sinus bradycardia on low dose Coreg- asymptomatic.  Continue with current therapy.   DM Her last A1C is as above.  She will continue on the meds as listed.   This is followed by Binnie Rail, MD  Current medicines are reviewed at length with the patient today.  The patient does not have concerns regarding medicines.  The following changes have been made:  As above  Labs/ tests ordered today include: None No orders of the defined types were placed in this encounter.    Disposition:   FU with me in two weeks.     Signed, Minus Breeding, MD  11/15/2016 9:58 AM    [facility]

## 2016-11-13 ENCOUNTER — Ambulatory Visit (INDEPENDENT_AMBULATORY_CARE_PROVIDER_SITE_OTHER): Payer: Medicare Other | Admitting: Cardiology

## 2016-11-13 VITALS — BP 156/88 | HR 69 | Ht 60.0 in | Wt 125.2 lb

## 2016-11-13 DIAGNOSIS — I25118 Atherosclerotic heart disease of native coronary artery with other forms of angina pectoris: Secondary | ICD-10-CM

## 2016-11-13 DIAGNOSIS — I255 Ischemic cardiomyopathy: Secondary | ICD-10-CM | POA: Diagnosis not present

## 2016-11-13 DIAGNOSIS — I5042 Chronic combined systolic (congestive) and diastolic (congestive) heart failure: Secondary | ICD-10-CM | POA: Diagnosis not present

## 2016-11-13 DIAGNOSIS — N184 Chronic kidney disease, stage 4 (severe): Secondary | ICD-10-CM | POA: Diagnosis not present

## 2016-11-13 NOTE — Patient Instructions (Signed)
Medication Instructions:  Continue current medications  Labwork: None Ordered  Testing/Procedures: None Ordered  Follow-Up: Your physician recommends that you schedule a follow-up appointment in: 4 Months   Any Other Special Instructions Will Be Listed Below (If Applicable).   If you need a refill on your cardiac medications before your next appointment, please call your pharmacy.   

## 2016-11-15 ENCOUNTER — Encounter: Payer: Self-pay | Admitting: Cardiology

## 2016-11-15 DIAGNOSIS — I25118 Atherosclerotic heart disease of native coronary artery with other forms of angina pectoris: Secondary | ICD-10-CM | POA: Insufficient documentation

## 2016-11-15 DIAGNOSIS — I255 Ischemic cardiomyopathy: Secondary | ICD-10-CM | POA: Insufficient documentation

## 2016-11-21 ENCOUNTER — Other Ambulatory Visit: Payer: Self-pay | Admitting: Cardiology

## 2016-11-23 ENCOUNTER — Ambulatory Visit: Payer: Medicare Other | Admitting: Cardiology

## 2016-12-14 ENCOUNTER — Other Ambulatory Visit: Payer: Self-pay

## 2016-12-14 MED ORDER — REPAGLINIDE 2 MG PO TABS: ORAL_TABLET | ORAL | 1 refills | Status: DC

## 2016-12-16 ENCOUNTER — Ambulatory Visit: Payer: Medicare Other | Admitting: Internal Medicine

## 2016-12-16 ENCOUNTER — Encounter: Payer: Medicare Other | Admitting: Nutrition

## 2016-12-16 ENCOUNTER — Ambulatory Visit (INDEPENDENT_AMBULATORY_CARE_PROVIDER_SITE_OTHER): Payer: Medicare Other | Admitting: Endocrinology

## 2016-12-16 ENCOUNTER — Encounter: Payer: Medicare Other | Attending: Endocrinology | Admitting: Nutrition

## 2016-12-16 ENCOUNTER — Encounter: Payer: Self-pay | Admitting: Endocrinology

## 2016-12-16 VITALS — BP 128/80 | HR 51

## 2016-12-16 DIAGNOSIS — E119 Type 2 diabetes mellitus without complications: Secondary | ICD-10-CM

## 2016-12-16 MED ORDER — REPAGLINIDE 1 MG PO TABS: 1.0000 mg | ORAL_TABLET | Freq: Three times a day (TID) | ORAL | 11 refills | Status: DC

## 2016-12-16 NOTE — Patient Instructions (Addendum)
check your blood sugar once a day.  vary the time of day when you check, between before the 3 meals, and at bedtime.  also check if you have symptoms of your blood sugar being too high or too low.  please keep a record of the readings and bring it to your next appointment here.  You can write it on any piece of paper.  please call us sooner if your blood sugar goes below 70, or if you have a lot of readings over 200.  Please reduce the repaglinide to 1 mg 3 times a day (just before each meal).  I have sent a prescription to your pharmacy.  Please come back for a follow-up appointment in 3-4 months.

## 2016-12-16 NOTE — Progress Notes (Signed)
Pt. Has taken Lantus using a pen,before, but wants to learn how to use a vial and syringe.  Says FBSs are in the 90s, but does not test any other time during the day.   She was shown how to use a vial and syringe and redemonstrated the procedure correctly.  We discussed the storage of the insulin and different injection sites.  We also discussed low blood sugars--symtpoms and treatments.  Seh reported good understanding of these topics and had no final questions.

## 2016-12-16 NOTE — Progress Notes (Signed)
Subjective:    Patient ID: [name], female    DOB: 17-Apr-1928, 83 y.o.   MRN: [id number]  HPI Pt returns for f/u of diabetes mellitus: DM type: 2 Dx'ed: 6659 Complications: polyneuropathy, renal insufficiency, TIA, and CAD.   Therapy: repaglinide.  GDM: never.   DKA: never.   Severe hypoglycemia: never.   Pancreatitis: never.  Other: she took insulin 2014-2015, and she went back for insulin educ in 2018; oral rx options are limited by renal insufficiency, CHF, and her request for generic meds.  Interval history:  She says cbg's are in the 50's and 60's.  pt states she feels well in general. Past Medical History:  Diagnosis Date  . Anxiety   . Benign neoplasm of adrenal gland   . CAD (coronary artery disease)    a. nonobstructive disease by cath in 2010. b. NSTEMI 05/2016: cath not performed secondary to Stage 4 CKD  . Calculus of kidney   . CHF (congestive heart failure) ([place])   . Diabetes mellitus    hx noncompliance with rx'd tx  . Diverticulosis of colon (without mention of hemorrhage)   . DJD (degenerative joint disease)   . GERD (gastroesophageal reflux disease)    hx DU with H pylori 01/2009 and GIB, s/p triple rx  . GI bleed 2012   duoedonal ulcer  . H. pylori infection   . Hx of subarachnoid hemorrhage 2001  . Hypercholesterolemia   . Hypertension   . LBBB (left bundle branch block)   . Nonischemic cardiomyopathy ([place])    hypertensive  . Nontoxic multinodular goiter   . Paroxysmal atrial fibrillation (HCC)    not anticoag candidate  . Pulmonary embolism ([place]) 02/2009   on anticoag thru 11/2010 - stopped due to risk>benefit  . Pure hypercholesterolemia   . Tubulovillous adenoma of colon 2012    Past Surgical History:  Procedure Laterality Date  . CATARACT EXTRACTION    . KNEE SURGERY     left  . SHOULDER SURGERY     right  . TONSILLECTOMY      Social History   Social History  . Marital status: Widowed    Spouse name: N/A  . Number of  children: 1  . Years of education: N/A   Occupational History  . retired Retired   Social History Main Topics  . Smoking status: Former Smoker    Packs/day: 0.30    Years: 36.00    Types: Cigarettes    Quit date: 08/18/1983  . Smokeless tobacco: Never Used  . Alcohol use 0.6 oz/week    1 Glasses of wine per week     Comment: occ wine, beer during holidays or at social events  . Drug use: No  . Sexual activity: Not Currently   Other Topics Concern  . Not on file   Social History Narrative   Widow, 1 daughter    Current Outpatient Prescriptions on File Prior to Visit  Medication Sig Dispense Refill  . amLODipine (NORVASC) 10 MG tablet Take 1 tablet (10 mg total) by mouth at bedtime. 90 tablet 1  . Ascorbic Acid (VITAMIN C) 1000 MG tablet Take 1,000 mg by mouth daily as needed (FOR IMMUNE).    Marland Kitchen aspirin EC 81 MG tablet Take 1 tablet (81 mg total) by mouth daily.    . Blood Glucose Monitoring Suppl (ONETOUCH VERIO IQ SYSTEM) w/Device KIT Use to check blood sugar 1 time per day. 1 kit 2  . calcitRIOL (ROCALTROL) 0.25 MCG  capsule Take 0.25 mcg by mouth daily.    . carvedilol (COREG) 12.5 MG tablet Take 1 tablet (12.5 mg total) by mouth 2 (two) times daily with a meal. 60 tablet 11  . furosemide (LASIX) 20 MG tablet TAKE 1 TABLET (20 MG TOTAL) BY MOUTH DAILY. 30 tablet 9  . glucose blood (ONETOUCH VERIO) test strip Use to check blood sugar 1 time per day. 100 each 2  . hydrALAZINE (APRESOLINE) 25 MG tablet Take 1 tablet (25 mg total) by mouth 4 (four) times daily. 120 tablet 3  . isosorbide mononitrate (IMDUR) 120 MG 24 hr tablet Take 1 tablet (120 mg total) by mouth daily. 30 tablet 11  . nitroGLYCERIN (NITROSTAT) 0.4 MG SL tablet Place 1 tablet (0.4 mg total) under the tongue every 5 (five) minutes as needed for chest pain. 50 tablet 0  . sacubitril-valsartan (ENTRESTO) 24-26 MG Take 1 tablet by mouth 2 (two) times daily. 60 tablet 6   No current facility-administered medications on  file prior to visit.     Allergies  Allergen Reactions  . Statins Other (See Comments)    Muscle Aches  . Lisinopril Other (See Comments) and Cough    REACTION: INTOL to ACE's w/ cough  . Penicillins Itching and Rash    Family History  Problem Relation Age of Onset  . Lung cancer Mother   . Brain cancer Mother   . Goiter Mother   . Cancer Mother   . Heart disease Mother   . Diabetes Mother   . Diabetes Maternal Grandmother   . Heart disease Maternal Aunt     x2  . Colon cancer Neg Hx     BP 128/80 (BP Location: Left Arm, Patient Position: Sitting)   Pulse (!) 51   SpO2 96%    Review of Systems She denies LOC    Objective:   Physical Exam VITAL SIGNS:  See vs page GENERAL: no distress Pulses: dorsalis pedis intact bilat.   MSK: no deformity of the feet.  CV: trace bilat leg edema.  Skin:  no ulcer on the feet.  normal color and temp on the feet. Neuro: sensation is intact to touch on the feet.    a1c=6.8%    Assessment & Plan:  Type 2 DM: slightly overcontrolled Frail elderly state: she is not a candidate for aggressive glycemic control. Hypoglycemia: due to repaglinide.  Patient Instructions    check your blood sugar once a day.  vary the time of day when you check, between before the 3 meals, and at bedtime.  also check if you have symptoms of your blood sugar being too high or too low.  please keep a record of the readings and bring it to your next appointment here.  You can write it on any piece of paper.  please call us sooner if your blood sugar goes below 70, or if you have a lot of readings over 200.  Please reduce the repaglinide to 1 mg 3 times a day (just before each meal).  I have sent a prescription to your pharmacy.  Please come back for a follow-up appointment in 3-4 months.

## 2016-12-16 NOTE — Patient Instructions (Signed)
TAke insulin as directed by Dr. Loanne Drilling Call if questions, or if blood sugar remain high, or drop low

## 2016-12-27 ENCOUNTER — Other Ambulatory Visit: Payer: Self-pay | Admitting: Internal Medicine

## 2017-01-17 ENCOUNTER — Other Ambulatory Visit: Payer: Self-pay | Admitting: Cardiology

## 2017-01-18 NOTE — Telephone Encounter (Signed)
REFILL 

## 2017-01-27 ENCOUNTER — Telehealth: Payer: Self-pay | Admitting: Internal Medicine

## 2017-01-27 NOTE — Telephone Encounter (Signed)
Called patient to schedule awv. Pt did not answer. Will try to call patient at a later time.

## 2017-02-08 ENCOUNTER — Emergency Department (HOSPITAL_COMMUNITY)
Admission: EM | Admit: 2017-02-08 | Discharge: 2017-02-08 | Disposition: A | Discharge status: Home / Self Care | Payer: Medicare Other | Attending: Emergency Medicine | Admitting: Emergency Medicine

## 2017-02-08 ENCOUNTER — Encounter (HOSPITAL_COMMUNITY): Payer: Self-pay | Admitting: *Deleted

## 2017-02-08 ENCOUNTER — Emergency Department (HOSPITAL_COMMUNITY): Payer: Medicare Other

## 2017-02-08 DIAGNOSIS — Z87891 Personal history of nicotine dependence: Secondary | ICD-10-CM | POA: Diagnosis not present

## 2017-02-08 DIAGNOSIS — Z9114 Patient's other noncompliance with medication regimen: Secondary | ICD-10-CM | POA: Insufficient documentation

## 2017-02-08 DIAGNOSIS — E1122 Type 2 diabetes mellitus with diabetic chronic kidney disease: Secondary | ICD-10-CM | POA: Diagnosis not present

## 2017-02-08 DIAGNOSIS — R079 Chest pain, unspecified: Secondary | ICD-10-CM | POA: Diagnosis not present

## 2017-02-08 DIAGNOSIS — I4891 Unspecified atrial fibrillation: Secondary | ICD-10-CM | POA: Diagnosis not present

## 2017-02-08 DIAGNOSIS — Z79899 Other long term (current) drug therapy: Secondary | ICD-10-CM | POA: Diagnosis not present

## 2017-02-08 DIAGNOSIS — I2510 Atherosclerotic heart disease of native coronary artery without angina pectoris: Secondary | ICD-10-CM | POA: Insufficient documentation

## 2017-02-08 DIAGNOSIS — R000 Tachycardia, unspecified: Secondary | ICD-10-CM | POA: Diagnosis not present

## 2017-02-08 DIAGNOSIS — Z7982 Long term (current) use of aspirin: Secondary | ICD-10-CM | POA: Insufficient documentation

## 2017-02-08 DIAGNOSIS — N184 Chronic kidney disease, stage 4 (severe): Secondary | ICD-10-CM | POA: Diagnosis not present

## 2017-02-08 DIAGNOSIS — I5022 Chronic systolic (congestive) heart failure: Secondary | ICD-10-CM | POA: Insufficient documentation

## 2017-02-08 DIAGNOSIS — E1140 Type 2 diabetes mellitus with diabetic neuropathy, unspecified: Secondary | ICD-10-CM | POA: Diagnosis not present

## 2017-02-08 DIAGNOSIS — I252 Old myocardial infarction: Secondary | ICD-10-CM | POA: Insufficient documentation

## 2017-02-08 DIAGNOSIS — I130 Hypertensive heart and chronic kidney disease with heart failure and stage 1 through stage 4 chronic kidney disease, or unspecified chronic kidney disease: Secondary | ICD-10-CM | POA: Insufficient documentation

## 2017-02-08 DIAGNOSIS — N390 Urinary tract infection, site not specified: Secondary | ICD-10-CM | POA: Insufficient documentation

## 2017-02-08 LAB — URINALYSIS, ROUTINE W REFLEX MICROSCOPIC
BILIRUBIN URINE: NEGATIVE
Glucose, UA: NEGATIVE mg/dL
Ketones, ur: NEGATIVE mg/dL
Nitrite: NEGATIVE
PROTEIN: 100 mg/dL — AB
SPECIFIC GRAVITY, URINE: 1.012 (ref 1.005–1.030)
pH: 5 (ref 5.0–8.0)

## 2017-02-08 LAB — CBC
HCT: 35.9 % — ABNORMAL LOW (ref 36.0–46.0)
Hemoglobin: 11.5 g/dL — ABNORMAL LOW (ref 12.0–15.0)
MCH: 28 pg (ref 26.0–34.0)
MCHC: 32 g/dL (ref 30.0–36.0)
MCV: 87.6 fL (ref 78.0–100.0)
PLATELETS: 173 10*3/uL (ref 150–400)
RBC: 4.1 MIL/uL (ref 3.87–5.11)
RDW: 14.5 % (ref 11.5–15.5)
WBC: 6.8 10*3/uL (ref 4.0–10.5)

## 2017-02-08 LAB — BASIC METABOLIC PANEL
Anion gap: 13 (ref 5–15)
BUN: 35 mg/dL — AB (ref 6–20)
CALCIUM: 9.5 mg/dL (ref 8.9–10.3)
CO2: 19 mmol/L — AB (ref 22–32)
CREATININE: 2.8 mg/dL — AB (ref 0.44–1.00)
Chloride: 112 mmol/L — ABNORMAL HIGH (ref 101–111)
GFR calc Af Amer: 16 mL/min — ABNORMAL LOW (ref >60)
GFR calc non Af Amer: 14 mL/min — ABNORMAL LOW (ref >60)
GLUCOSE: 220 mg/dL — AB (ref 65–99)
Potassium: 4.4 mmol/L (ref 3.5–5.1)
Sodium: 144 mmol/L (ref 135–145)

## 2017-02-08 LAB — I-STAT TROPONIN, ED: TROPONIN I, POC: 0.05 ng/mL (ref 0.00–0.08)

## 2017-02-08 MED ORDER — CARVEDILOL 12.5 MG PO TABS: 12.5000 mg | ORAL_TABLET | Freq: Once | ORAL | Status: DC

## 2017-02-08 MED ORDER — DILTIAZEM HCL 100 MG IV SOLR
5.0000 mg/h | INTRAVENOUS | Status: DC
  Administered 2017-02-08: 5 mg/h via INTRAVENOUS
  Filled 2017-02-08: qty 100

## 2017-02-08 MED ORDER — DILTIAZEM LOAD VIA INFUSION
10.0000 mg | Freq: Once | INTRAVENOUS | Status: AC
  Administered 2017-02-08: 10 mg via INTRAVENOUS
  Filled 2017-02-08: qty 10

## 2017-02-08 MED ORDER — FOSFOMYCIN TROMETHAMINE 3 G PO PACK
3.0000 g | PACK | Freq: Once | ORAL | Status: AC
  Administered 2017-02-08: 3 g via ORAL
  Filled 2017-02-08: qty 3

## 2017-02-08 MED ORDER — ACETAMINOPHEN 325 MG PO TABS
650.0000 mg | ORAL_TABLET | Freq: Once | ORAL | Status: AC
Start: 2017-02-08 — End: 2017-02-08
  Administered 2017-02-08: 650 mg via ORAL
  Filled 2017-02-08: qty 2

## 2017-02-08 NOTE — ED Triage Notes (Signed)
Patient presents to ed via [facility] c/o chest pain onset earlier today states the pain radiates to her left arm. Patient took 1 NTG and ASA 324 at home without relief. States the pain is 6/10 . Up ems arrival patient was in uncontrolled a-fib rate 160 was given adenosine 6 mg without effect. Was also given another NTG. Without relief. C/o sob with nausea earierl. States she didn't take any of her medication today .

## 2017-02-08 NOTE — ED Notes (Signed)
Patient transported to x-ray. ?

## 2017-02-08 NOTE — ED Notes (Signed)
ED Provider at bedside. 

## 2017-02-08 NOTE — Discharge Instructions (Signed)
Please take all medicines as directed. Please followup with primary doctor in 3 days for review of urine culture.

## 2017-02-08 NOTE — ED Provider Notes (Signed)
[facility] DEPT Provider Note   CSN: [id number] Arrival date & time: 02/08/17  1449     History   Chief Complaint Chief Complaint  Patient presents with  . Chest Pain    HPI [name] is a 83 y.o. female.  The history is provided by the patient, medical records and a relative.  Palpitations   This is a recurrent problem. The current episode started 1 to 2 hours ago. The problem occurs constantly. The problem has not changed since onset.Associated symptoms include chest pain and irregular heartbeat. Pertinent negatives include no fever, no abdominal pain, no vomiting, no back pain, no cough and no shortness of breath. She has tried nothing for the symptoms. Risk factors include post menopause (medication noncompliance).    Past Medical History:  Diagnosis Date  . Anxiety   . Benign neoplasm of adrenal gland   . CAD (coronary artery disease)    a. nonobstructive disease by cath in 2010. b. NSTEMI 05/2016: cath not performed secondary to Stage 4 CKD  . Calculus of kidney   . CHF (congestive heart failure) ([place])   . Diabetes mellitus    hx noncompliance with rx'd tx  . Diverticulosis of colon (without mention of hemorrhage)   . DJD (degenerative joint disease)   . GERD (gastroesophageal reflux disease)    hx DU with H pylori 01/2009 and GIB, s/p triple rx  . GI bleed 2012   duoedonal ulcer  . H. pylori infection   . Hx of subarachnoid hemorrhage 2001  . Hypercholesterolemia   . Hypertension   . LBBB (left bundle branch block)   . Nonischemic cardiomyopathy ([place])    hypertensive  . Nontoxic multinodular goiter   . Paroxysmal atrial fibrillation (HCC)    not anticoag candidate  . Pulmonary embolism ([place]) 02/2009   on anticoag thru 11/2010 - stopped due to risk>benefit  . Pure hypercholesterolemia   . Tubulovillous adenoma of colon 2012    Patient Active Problem List   Diagnosis Date Noted  . Coronary artery disease of native artery of native heart with  stable angina pectoris ([place]) 11/15/2016  . Ischemic cardiomyopathy 11/15/2016  . Dilated cardiomyopathy ([place]) 10/06/2016  . LBBB (left bundle branch block) 10/06/2016  . NSTEMI (non-ST elevated myocardial infarction) ([place]) 09/25/2016  . History of pulmonary embolism 09/24/2016  . SOB (shortness of breath) 09/08/2016  . Bilateral leg edema 09/08/2016  . Chronic systolic heart failure ([place]) 09/08/2016  . Symptomatic bradycardia 05/31/2016  . Thrombocytopenia ([place]) 05/31/2016  . Neuropathy 04/30/2016  . Diabetes mellitus type 2 in nonobese ([place]) 12/25/2015  . Chest pain 03/22/2015  . PAROXYSMAL ATRIAL FIBRILLATION 09/30/2009  . CKD (chronic kidney disease), stage IV ([place]) 08/05/2009  . GOITER, MULTINODULAR 05/11/2009  . PEPTIC ULCER DISEASE, HELICOBACTER PYLORI POSITIVE 03/11/2009  . Coronary atherosclerosis 11/29/2008  . BENIGN NEOPLASM OF ADRENAL GLAND 06/20/2008  . DIVERTICULOSIS OF COLON 06/20/2008  . ARTERIOVENOUS MALFORMATION 06/20/2008  . HYPERCHOLESTEROLEMIA 05/10/2008  . Essential hypertension 05/10/2008  . TRANSIENT ISCHEMIC ATTACK 05/10/2008  . NEPHROLITHIASIS 05/10/2008    Past Surgical History:  Procedure Laterality Date  . CATARACT EXTRACTION    . KNEE SURGERY     left  . SHOULDER SURGERY     right  . TONSILLECTOMY      OB History    No data available       Home Medications    Prior to Admission medications   Medication Sig Start Date End Date Taking? Authorizing Provider  acetaminophen (  TYLENOL) 325 MG tablet Take 325-650 mg by mouth every 6 (six) hours as needed for mild pain.   Yes [provider]  amLODipine (NORVASC) 10 MG tablet Take 1 tablet (10 mg total) by mouth at bedtime. 10/29/16  Yes Burns, Claudina Lick, MD  Ascorbic Acid (VITAMIN C) 1000 MG tablet Take 1,000 mg by mouth 2 (two) times a week.    Yes [provider]  aspirin EC 81 MG tablet Take 1 tablet (81 mg total) by mouth daily. 06/15/16  Yes Burns, Claudina Lick, MD  calcitRIOL  (ROCALTROL) 0.25 MCG capsule Take 0.25 mcg by mouth daily. 05/07/16  Yes [provider]  carvedilol (COREG) 12.5 MG tablet Take 1 tablet (12.5 mg total) by mouth 2 (two) times daily with a meal. 10/29/16  Yes Minus Breeding, MD  furosemide (LASIX) 20 MG tablet Take 1 tablet (20 mg total) by mouth daily. NEED OV. Patient taking differently: Take 40 mg by mouth daily as needed for edema.  01/18/17  Yes Martinique, Peter M, MD  hydrALAZINE (APRESOLINE) 25 MG tablet Take 1 tablet (25 mg total) by mouth 4 (four) times daily. Patient taking differently: Take 25 mg by mouth 3 (three) times daily.  09/11/16  Yes Cheryln Manly, NP  isosorbide mononitrate (IMDUR) 120 MG 24 hr tablet Take 1 tablet (120 mg total) by mouth daily. 10/29/16  Yes Hochrein, Jeneen Rinks, MD  nitroGLYCERIN (NITROSTAT) 0.4 MG SL tablet PLACE 1 TABLET (0.4 MG TOTAL) UNDER THE TONGUE EVERY 5 (FIVE) MINUTES AS NEEDED FOR CHEST PAIN. 12/28/16  Yes Burns, Claudina Lick, MD  repaglinide (PRANDIN) 1 MG tablet Take 1 tablet (1 mg total) by mouth 3 (three) times daily before meals. 12/16/16  Yes Renato Shin, MD  sacubitril-valsartan (ENTRESTO) 24-26 MG Take 1 tablet by mouth 2 (two) times daily. 09/11/16  Yes Cheryln Manly, NP  Blood Glucose Monitoring Suppl (ONETOUCH VERIO IQ SYSTEM) w/Device KIT Use to check blood sugar 1 time per day. 09/28/16   Renato Shin, MD  glucose blood [facility] VERIO) test strip Use to check blood sugar 1 time per day. 09/28/16   Renato Shin, MD    Family History Family History  Problem Relation Age of Onset  . Lung cancer Mother   . Brain cancer Mother   . Goiter Mother   . Cancer Mother   . Heart disease Mother   . Diabetes Mother   . Diabetes Maternal Grandmother   . Heart disease Maternal Aunt        x2  . Colon cancer Neg Hx     Social History Social History  Substance Use Topics  . Smoking status: Former Smoker    Packs/day: 0.30    Years: 36.00    Types: Cigarettes    Quit date: 08/18/1983  .  Smokeless tobacco: Never Used  . Alcohol use 0.6 oz/week    1 Glasses of wine per week     Comment: occ wine, beer during holidays or at social events     Allergies   Statins; Lisinopril; and Penicillins   Review of Systems Review of Systems  Constitutional: Negative for chills and fever.  HENT: Negative for ear pain and sore throat.   Eyes: Negative for pain and visual disturbance.  Respiratory: Negative for cough and shortness of breath.   Cardiovascular: Positive for chest pain and palpitations.  Gastrointestinal: Negative for abdominal pain and vomiting.  Genitourinary: Negative for dysuria and hematuria.  Musculoskeletal: Negative for arthralgias and back pain.  Skin: Negative for color change and rash.  Neurological: Negative for seizures and syncope.  All other systems reviewed and are negative.    Physical Exam Updated Vital Signs BP 121/81   Pulse (!) 48   Temp 98.5 F (36.9 C) (Oral)   Resp 12   Ht 5' (1.524 m)   Wt 53.5 kg (118 lb)   SpO2 100%   BMI 23.05 kg/m   Physical Exam  Constitutional: She appears well-developed.  HENT:  Head: Normocephalic and atraumatic.  Eyes: Conjunctivae are normal.  Neck: Neck supple.  Cardiovascular: An irregularly irregular rhythm present. Tachycardia present.   No murmur heard. Pulmonary/Chest: Effort normal and breath sounds normal. No respiratory distress. She has no wheezes. She has no rales.  Abdominal: Soft. She exhibits no distension. There is no tenderness. There is no guarding.  Musculoskeletal: She exhibits no edema.  Neurological: She is alert. No cranial nerve deficit. Coordination normal.  5/5 motor strength and intact sensation in all extremities. Finger-to-nose intact bilaterally  Skin: Skin is warm and dry.  Nursing note and vitals reviewed.    ED Treatments / Results  Labs (all labs ordered are listed, but only abnormal results are displayed) Labs Reviewed  URINE CULTURE - Abnormal; Notable for  the following:       Result Value   Culture >=100,000 COLONIES/mL GRAM NEGATIVE RODS (*)    All other components within normal limits  BASIC METABOLIC PANEL - Abnormal; Notable for the following:    Chloride 112 (*)    CO2 19 (*)    Glucose, Bld 220 (*)    BUN 35 (*)    Creatinine, Ser 2.80 (*)    GFR calc non Af Amer 14 (*)    GFR calc Af Amer 16 (*)    All other components within normal limits  CBC - Abnormal; Notable for the following:    Hemoglobin 11.5 (*)    HCT 35.9 (*)    All other components within normal limits  URINALYSIS, ROUTINE W REFLEX MICROSCOPIC - Abnormal; Notable for the following:    APPearance CLOUDY (*)    Hgb urine dipstick SMALL (*)    Protein, ur 100 (*)    Leukocytes, UA LARGE (*)    Bacteria, UA MANY (*)    Squamous Epithelial / LPF 0-5 (*)    All other components within normal limits  I-STAT TROPOININ, ED    EKG  EKG Interpretation  Date/Time:  Monday February 08 2017 14:59:20 EDT Ventricular Rate:  144 PR Interval:    QRS Duration: 158 QT Interval:  329 QTC Calculation: 510 R Axis:   -54 Text Interpretation:  Extreme tachycardia with wide complex, no further rhythm analysis attempted Confirmed by Pryor Curia [phone]) on 02/09/2017 1:48:58 PM       Radiology Dg Chest 2 View  Result Date: 02/08/2017 CLINICAL DATA:  Chest pain . EXAM: CHEST  2 VIEW COMPARISON:  09/24/2016.  09/08/2016 . FINDINGS: Cardiomegaly with mild pulmonary vascular prominence with very mild Kerley B-lines. Mild CHF cannot be excluded. No pleural effusion or pneumothorax . Diffuse osteopenia degenerative change. No acute bony abnormality identified. Postsurgical changes right shoulder . IMPRESSION: Cardiomegaly with very mild Kerley B-lines. Mild CHF cannot be excluded. No prominent pulmonary edema noted . Electronically Signed   By: Marcello Moores  Register   On: 02/08/2017 15:56    Procedures Procedures (including critical care time)  Medications Ordered in ED Medications    diltiazem (CARDIZEM) 1 mg/mL load via infusion 10  mg (10 mg Intravenous Bolus from Bag 02/08/17 1617)  acetaminophen (TYLENOL) tablet 650 mg (650 mg Oral Given 02/08/17 1823)  fosfomycin (MONUROL) packet 3 g (3 g Oral Given 02/08/17 2041)     Initial Impression / Assessment and Plan / ED Course  I have reviewed the triage vital signs and the nursing notes.  Pertinent labs & imaging results that were available during my care of the patient were reviewed by me and considered in my medical decision making (see chart for details).     83 year old female history of diabetes, CAD, CHF, HTN, HLD, PE, and medication noncompliance who presents with A. fib with RVR.  She reports waking up 2 hours ago and having intense left-sided chest pain.  She states she has been poorly compliant with all of her medicines, saying she only takes medicines 2 days out of the week.  She last took her Coreg several days ago.  She is not currently on anticoagulation due to previous GI bleeding.  She denies recent fevers or other illnesses.  AF, tachycardic 150s, otherwise VSS. Lungs CTAB. Abdomen soft, benign throughout.  EKG showing A. Fib with RVR. Dilt bolus and gtt started. Pt with rate control to 60s-70s. Dilt gtt stopped. Home coreg held with HR 40s-50s. Pt with resolution of symptoms. Suspect A. Fib w/ RVR in setting of medication noncompliance. UA showing evidence of UTI. Fosfomycin given. Pt is stable for continued PCP f/u.  Return precautions provided for worsening symptoms. Pt will f/u with PCP at first availability. Pt verbalized agreement with plan.  Pt care d/w Dr. Vanita Panda  Final Clinical Impressions(s) / ED Diagnoses   Final diagnoses:  Atrial fibrillation with rapid ventricular response ([place])  Noncompliance with medications  Urinary tract infection without hematuria, site unspecified    New Prescriptions Discharge Medication List as of 02/08/2017  8:04 PM       Payton Emerald, MD 02/10/17 1134     Carmin Muskrat, MD 02/13/17 0005

## 2017-02-08 NOTE — ED Notes (Signed)
Stopped diltiazem drip per order protocol - HR <60. Pt denies CP/SOB at this time. Repositioned in bed.

## 2017-02-08 NOTE — ED Notes (Signed)
MD at bedside. 

## 2017-02-11 LAB — URINE CULTURE: Culture: >=100000 — AB

## 2017-02-11 NOTE — Progress Notes (Signed)
Subjective:    Patient ID: [name], female    DOB: 1928/08/08, 83 y.o.   MRN: [id number]  HPI The patient is here for follow up.  She went to the ED 02/08/17 for chest pain.  Her left sided chest pain started 1-2 hours prior to her going to the ED and woke her up.  The pain was constant and associated with the irregular heartbeat.  She denied fever, cough, SOB, abdominal pain.  She has been non compliant with her medications - she has only been taking her medications 2 days a week.  She last took her coreg several days ago.  She is not on an anticoagulant due to prior GIB.  Her exam was normal, except tachycardia of 150's.     Her kidney function was reduced, but stable.  Urinalysis shows UTI. Her EKG Afib at 144 with a wide complex.  She received cardizam IV, tylenol and fosfomycin in the ED.    Overall, she feels well.  She is not compliant with her medication.  She is currently taking coreg - she ran out of the medication and needs to call for a refill.  She took it once since leaving the hospital.  She denies chest pain, palpitations, edema.  She has some SOB with inclines/stairs which is not new.    Diabetes: She is taking her medication daily as prescribed. She follows with Dr Loanne Drilling.  She is compliant with a diabetic diet. She is not exercising regularly. She monitors her sugars and they have been running 92, 114.   UTI:  She was found to have UTI in the ED.  She was given fosfomycin.  She denies fever/chills, nausea, abdominal pain, dysuria and hematuria.   Medications and allergies reviewed with patient and updated if appropriate.  Patient Active Problem List   Diagnosis Date Noted  . Coronary artery disease of native artery of native heart with stable angina pectoris ([place]) 11/15/2016  . Ischemic cardiomyopathy 11/15/2016  . Dilated cardiomyopathy ([place]) 10/06/2016  . LBBB (left bundle branch block) 10/06/2016  . NSTEMI (non-ST elevated myocardial infarction) ([place])  09/25/2016  . History of pulmonary embolism 09/24/2016  . SOB (shortness of breath) 09/08/2016  . Bilateral leg edema 09/08/2016  . Chronic systolic heart failure ([place]) 09/08/2016  . Symptomatic bradycardia 05/31/2016  . Thrombocytopenia ([place]) 05/31/2016  . Neuropathy 04/30/2016  . Diabetes mellitus type 2 in nonobese ([place]) 12/25/2015  . Chest pain 03/22/2015  . PAROXYSMAL ATRIAL FIBRILLATION 09/30/2009  . CKD (chronic kidney disease), stage IV ([place]) 08/05/2009  . GOITER, MULTINODULAR 05/11/2009  . PEPTIC ULCER DISEASE, HELICOBACTER PYLORI POSITIVE 03/11/2009  . Coronary atherosclerosis 11/29/2008  . BENIGN NEOPLASM OF ADRENAL GLAND 06/20/2008  . DIVERTICULOSIS OF COLON 06/20/2008  . ARTERIOVENOUS MALFORMATION 06/20/2008  . HYPERCHOLESTEROLEMIA 05/10/2008  . Essential hypertension 05/10/2008  . TRANSIENT ISCHEMIC ATTACK 05/10/2008  . NEPHROLITHIASIS 05/10/2008    Current Outpatient Prescriptions on File Prior to Visit  Medication Sig Dispense Refill  . acetaminophen (TYLENOL) 325 MG tablet Take 325-650 mg by mouth every 6 (six) hours as needed for mild pain.    Marland Kitchen amLODipine (NORVASC) 10 MG tablet Take 1 tablet (10 mg total) by mouth at bedtime. 90 tablet 1  . Ascorbic Acid (VITAMIN C) 1000 MG tablet Take 1,000 mg by mouth 2 (two) times a week.     Marland Kitchen aspirin EC 81 MG tablet Take 1 tablet (81 mg total) by mouth daily.    . Blood Glucose Monitoring Suppl (  ONETOUCH VERIO IQ SYSTEM) w/Device KIT Use to check blood sugar 1 time per day. 1 kit 2  . calcitRIOL (ROCALTROL) 0.25 MCG capsule Take 0.25 mcg by mouth daily.    . carvedilol (COREG) 12.5 MG tablet Take 1 tablet (12.5 mg total) by mouth 2 (two) times daily with a meal. 60 tablet 11  . furosemide (LASIX) 20 MG tablet Take 1 tablet (20 mg total) by mouth daily. NEED OV. (Patient taking differently: Take 40 mg by mouth daily as needed for edema. ) 30 tablet 3  . glucose blood (ONETOUCH VERIO) test strip Use to check blood sugar 1 time  per day. 100 each 2  . hydrALAZINE (APRESOLINE) 25 MG tablet Take 1 tablet (25 mg total) by mouth 4 (four) times daily. (Patient taking differently: Take 25 mg by mouth 3 (three) times daily. ) 120 tablet 3  . isosorbide mononitrate (IMDUR) 120 MG 24 hr tablet Take 1 tablet (120 mg total) by mouth daily. 30 tablet 11  . nitroGLYCERIN (NITROSTAT) 0.4 MG SL tablet PLACE 1 TABLET (0.4 MG TOTAL) UNDER THE TONGUE EVERY 5 (FIVE) MINUTES AS NEEDED FOR CHEST PAIN. 50 tablet 0  . repaglinide (PRANDIN) 1 MG tablet Take 1 tablet (1 mg total) by mouth 3 (three) times daily before meals. 90 tablet 11  . sacubitril-valsartan (ENTRESTO) 24-26 MG Take 1 tablet by mouth 2 (two) times daily. 60 tablet 6   No current facility-administered medications on file prior to visit.     Past Medical History:  Diagnosis Date  . Anxiety   . Benign neoplasm of adrenal gland   . CAD (coronary artery disease)    a. nonobstructive disease by cath in 2010. b. NSTEMI 05/2016: cath not performed secondary to Stage 4 CKD  . Calculus of kidney   . CHF (congestive heart failure) ([place])   . Diabetes mellitus    hx noncompliance with rx'd tx  . Diverticulosis of colon (without mention of hemorrhage)   . DJD (degenerative joint disease)   . GERD (gastroesophageal reflux disease)    hx DU with H pylori 01/2009 and GIB, s/p triple rx  . GI bleed 2012   duoedonal ulcer  . H. pylori infection   . Hx of subarachnoid hemorrhage 2001  . Hypercholesterolemia   . Hypertension   . LBBB (left bundle branch block)   . Nonischemic cardiomyopathy ([place])    hypertensive  . Nontoxic multinodular goiter   . Paroxysmal atrial fibrillation ([facility])    not anticoag candidate  . Pulmonary embolism ([place]) 02/2009   on anticoag thru 11/2010 - stopped due to risk>benefit  . Pure hypercholesterolemia   . Tubulovillous adenoma of colon 2012    Past Surgical History:  Procedure Laterality Date  . CATARACT EXTRACTION    . KNEE SURGERY     left  .  SHOULDER SURGERY     right  . TONSILLECTOMY      Social History   Social History  . Marital status: Widowed    Spouse name: N/A  . Number of children: 1  . Years of education: N/A   Occupational History  . retired Retired   Social History Main Topics  . Smoking status: Former Smoker    Packs/day: 0.30    Years: 36.00    Types: Cigarettes    Quit date: 08/18/1983  . Smokeless tobacco: Never Used  . Alcohol use 0.6 oz/week    1 Glasses of wine per week     Comment: occ wine,  beer during holidays or at social events  . Drug use: No  . Sexual activity: Not Currently   Other Topics Concern  . Not on file   Social History Narrative   Widow, 1 daughter    Family History  Problem Relation Age of Onset  . Lung cancer Mother   . Brain cancer Mother   . Goiter Mother   . Cancer Mother   . Heart disease Mother   . Diabetes Mother   . Diabetes Maternal Grandmother   . Heart disease Maternal Aunt        x2  . Colon cancer Neg Hx     Review of Systems  Constitutional: Negative for appetite change, chills and fever.  Respiratory: Positive for shortness of breath (only with inclines, stairs - not new). Negative for cough and wheezing.   Cardiovascular: Negative for chest pain, palpitations and leg swelling.  Gastrointestinal: Negative for abdominal pain and nausea.  Genitourinary: Positive for frequency (due to lasix). Negative for dysuria and hematuria.  Neurological: Positive for dizziness (sometimes gets off balance). Negative for light-headedness and headaches.       Objective:   Vitals:   02/12/17 0842  BP: (!) 154/94  Pulse: 86  Resp: 16  Temp: 97.7 F (36.5 C)   Wt Readings from Last 3 Encounters:  02/12/17 120 lb (54.4 kg)  02/08/17 118 lb (53.5 kg)  11/13/16 125 lb 3.2 oz (56.8 kg)   Body mass index is 23.44 kg/m.   Physical Exam    Constitutional: Appears well-developed and well-nourished. No distress.  HENT:  Head: Normocephalic and  atraumatic.  Neck: Neck supple. No tracheal deviation present. No thyromegaly present.  No cervical lymphadenopathy Cardiovascular: Normal rate, regular rhythm and split S2   No murmur heard. No carotid bruit .  No edema Pulmonary/Chest: Effort normal and breath sounds normal. No respiratory distress. No has no wheezes. No rales.  Abdomen:  Soft, non tender, non distended Skin: Skin is warm and dry. Not diaphoretic.  Psychiatric: Normal mood and affect. Behavior is normal.      Assessment & Plan:    See Problem List for Assessment and Plan of chronic medical problems.

## 2017-02-11 NOTE — Patient Instructions (Addendum)
Have a urine test today.   Test(s) ordered today. Your results will be released to Deputy (or called to you) after review, usually within 72hours after test completion. If any changes need to be made, you will be notified at that same time.   Medications reviewed and updated.  No changes recommended at this time.    Please followup in September as scheduled

## 2017-02-12 ENCOUNTER — Telehealth: Payer: Self-pay | Admitting: *Deleted

## 2017-02-12 ENCOUNTER — Ambulatory Visit (INDEPENDENT_AMBULATORY_CARE_PROVIDER_SITE_OTHER): Payer: Medicare Other | Admitting: Internal Medicine

## 2017-02-12 ENCOUNTER — Encounter: Payer: Self-pay | Admitting: Internal Medicine

## 2017-02-12 ENCOUNTER — Other Ambulatory Visit (INDEPENDENT_AMBULATORY_CARE_PROVIDER_SITE_OTHER): Payer: Medicare Other

## 2017-02-12 VITALS — BP 154/94 | HR 86 | Temp 97.7°F | Resp 16 | Ht 60.0 in | Wt 120.0 lb

## 2017-02-12 DIAGNOSIS — N390 Urinary tract infection, site not specified: Secondary | ICD-10-CM

## 2017-02-12 DIAGNOSIS — I10 Essential (primary) hypertension: Secondary | ICD-10-CM | POA: Diagnosis not present

## 2017-02-12 DIAGNOSIS — E119 Type 2 diabetes mellitus without complications: Secondary | ICD-10-CM

## 2017-02-12 DIAGNOSIS — I480 Paroxysmal atrial fibrillation: Secondary | ICD-10-CM

## 2017-02-12 LAB — URINALYSIS, ROUTINE W REFLEX MICROSCOPIC
BILIRUBIN URINE: NEGATIVE
Hgb urine dipstick: NEGATIVE
KETONES UR: NEGATIVE
Nitrite: NEGATIVE
PH: 6 (ref 5.0–8.0)
Specific Gravity, Urine: 1.015 (ref 1.000–1.030)
Total Protein, Urine: 30 — AB
UROBILINOGEN UA: 0.2 (ref 0.0–1.0)
Urine Glucose: NEGATIVE

## 2017-02-12 NOTE — Assessment & Plan Note (Signed)
Elevated here today.  She states it is better at home - the drive in here was stressful She has not taken the coreg in a couple of days Stressed compliance with medication No changes today  Has follow up in Sept

## 2017-02-12 NOTE — Telephone Encounter (Signed)
Post ED Visit - Positive Culture Follow-up  Culture report reviewed by antimicrobial stewardship pharmacist:  []  Elenor Quinones, Pharm.D. []  Heide Guile, Pharm.D., BCPS AQ-ID [x]  Parks Neptune, Pharm.D., BCPS []  Alycia Rossetti, Pharm.D., BCPS []  [place], Pharm.D., BCPS, AAHIVP []  Legrand Como, Pharm.D., BCPS, AAHIVP []  Salome Arnt, PharmD, BCPS []  Dimitri Ped, PharmD, BCPS []  Vincenza Hews, PharmD, BCPS  Positive urine culture Treated with Fosfamycin, organism sensitive to the same and no further patient follow-up is required at this time.  Harlon Flor [facility] 02/12/2017, 11:06 AM

## 2017-02-12 NOTE — Assessment & Plan Note (Signed)
Treated while in ED with fosfomycin Will recheck UA, UCx No current symptoms

## 2017-02-12 NOTE — Assessment & Plan Note (Signed)
In sinus now, but has paroxysmal Afib - most recently likely from non compliance with medication Stressed taking her medication daily as prescribed - she has only taken her coreg once since leaving the hospital Discussed risk of Afib - CVA - she is not on A/C due to prior GIB

## 2017-02-12 NOTE — Assessment & Plan Note (Addendum)
Lab Results  Component Value Date   HGBA1C 7.0 10/13/2016   Controlled Sugars controlled at home Managed by Dr Loanne Drilling

## 2017-02-13 ENCOUNTER — Encounter: Payer: Self-pay | Admitting: Internal Medicine

## 2017-02-13 LAB — URINE CULTURE: ORGANISM ID, BACTERIA: NO GROWTH

## 2017-02-16 ENCOUNTER — Other Ambulatory Visit: Payer: Self-pay

## 2017-02-16 MED ORDER — REPAGLINIDE 1 MG PO TABS: 1.0000 mg | ORAL_TABLET | Freq: Three times a day (TID) | ORAL | 2 refills | Status: DC

## 2017-02-17 NOTE — Progress Notes (Signed)
Cardiology Office Note   Date:  02/18/2017   ID:  [name], DOB 1927/10/11, MRN [id number]  PCP:  Binnie Rail, MD  Cardiologist:   Minus Breeding, MD   Chief Complaint  Patient presents with  . Atrial Fibrillation      History of Present Illness: [name] is a 83 y.o. female who presents for follow up of unstable angina with hospitalization for NSTEMI.  She has an ischemic cardiomyopathy.  She has been managed medically secondary to CKD.   Since I last saw her in March she was in the ED last month with atrial fib with RVR.  I reviewed these records for this visit.   She had not been compliant with meds and she had a UTI.  She had cancelled follow up with me in [place].   She was cardioverted in the ED.  of note she was not sent home on anticoagulation because of previous GI bleeding.  Since that time she says she's been compliant with her medications. She only feels palpitations rarely when she gets up and moves around it may settle down very quickly. That day she had nausea and vomiting and then lower abdominal pain and she's not had any of since then. She's not required any nitroglycerin. She's not had any of the arm pain is her usual angina. She's been breathing well. She says she's been compliant with her medications.  Past Medical History:  Diagnosis Date  . Anxiety   . Benign neoplasm of adrenal gland   . CAD (coronary artery disease)    a. nonobstructive disease by cath in 2010. b. NSTEMI 05/2016: cath not performed secondary to Stage 4 CKD  . Calculus of kidney   . CHF (congestive heart failure) ([place])   . Diabetes mellitus    hx noncompliance with rx'd tx  . Diverticulosis of colon (without mention of hemorrhage)   . DJD (degenerative joint disease)   . GERD (gastroesophageal reflux disease)    hx DU with H pylori 01/2009 and GIB, s/p triple rx  . GI bleed 2012   duoedonal ulcer  . H. pylori infection   . Hx of subarachnoid hemorrhage 2001  .  Hypercholesterolemia   . Hypertension   . LBBB (left bundle branch block)   . Nonischemic cardiomyopathy ([place])    hypertensive  . Nontoxic multinodular goiter   . Paroxysmal atrial fibrillation ([facility])    not anticoag candidate  . Pulmonary embolism ([place]) 02/2009   on anticoag thru 11/2010 - stopped due to risk>benefit  . Pure hypercholesterolemia   . Tubulovillous adenoma of colon 2012    Past Surgical History:  Procedure Laterality Date  . CATARACT EXTRACTION    . KNEE SURGERY     left  . SHOULDER SURGERY     right  . TONSILLECTOMY       Current Outpatient Prescriptions  Medication Sig Dispense Refill  . acetaminophen (TYLENOL) 325 MG tablet Take 325-650 mg by mouth every 6 (six) hours as needed for mild pain.    Marland Kitchen amLODipine (NORVASC) 10 MG tablet Take 1 tablet (10 mg total) by mouth at bedtime. 90 tablet 1  . Ascorbic Acid (VITAMIN C) 1000 MG tablet Take 1,000 mg by mouth 2 (two) times a week.     Marland Kitchen aspirin EC 81 MG tablet Take 1 tablet (81 mg total) by mouth daily.    . Blood Glucose Monitoring Suppl (ONETOUCH VERIO IQ SYSTEM) w/Device KIT Use to check blood  sugar 1 time per day. 1 kit 2  . calcitRIOL (ROCALTROL) 0.25 MCG capsule Take 0.25 mcg by mouth daily.    . carvedilol (COREG) 12.5 MG tablet Take 1 tablet (12.5 mg total) by mouth 2 (two) times daily with a meal. 60 tablet 11  . furosemide (LASIX) 20 MG tablet Take 1 tablet (20 mg total) by mouth daily. NEED OV. (Patient taking differently: Take 40 mg by mouth daily as needed for edema. ) 30 tablet 3  . glucose blood (ONETOUCH VERIO) test strip Use to check blood sugar 1 time per day. 100 each 2  . hydrALAZINE (APRESOLINE) 25 MG tablet Take 1 tablet (25 mg total) by mouth 4 (four) times daily. (Patient taking differently: Take 25 mg by mouth 3 (three) times daily. ) 120 tablet 3  . isosorbide mononitrate (IMDUR) 120 MG 24 hr tablet Take 1 tablet (120 mg total) by mouth daily. 30 tablet 11  . nitroGLYCERIN (NITROSTAT) 0.4  MG SL tablet PLACE 1 TABLET (0.4 MG TOTAL) UNDER THE TONGUE EVERY 5 (FIVE) MINUTES AS NEEDED FOR CHEST PAIN. 50 tablet 0  . repaglinide (PRANDIN) 1 MG tablet Take 1 tablet (1 mg total) by mouth 3 (three) times daily before meals. 180 tablet 2  . sacubitril-valsartan (ENTRESTO) 24-26 MG Take 1 tablet by mouth 2 (two) times daily. 60 tablet 6   No current facility-administered medications for this visit.     Allergies:   Statins; Lisinopril; and Penicillins    ROS:  Please see the history of present illness.   Otherwise, review of systems are positive for none.   All other systems are reviewed and negative.    PHYSICAL EXAM: VS:  BP 132/82   Pulse 93   Ht 5' (1.524 m)   Wt 120 lb (54.4 kg)   BMI 23.44 kg/m  , BMI Body mass index is 23.44 kg/m.  GENERAL:  Well appearing, looks much younger than stated age.  HEENT:  Pupils equal round and reactive, fundi not visualized, oral mucosa unremarkable NECK:  No jugular venous distention, waveform within normal limits, carotid upstroke brisk and symmetric, no bruits, no thyromegaly LUNGS:  Clear to auscultation bilaterally BACK:  No CVA tenderness CHEST:  Unremarkable HEART:  PMI not displaced or sustained,S1 and S2 within normal limits, no S3, no S4, no clicks, no rubs, no murmurs ABD:  Flat, positive bowel sounds normal in frequency in pitch, no bruits, no rebound, no guarding, no midline pulsatile mass, no hepatomegaly, no splenomegaly EXT:  2 plus pulses throughout, no edema, no cyanosis no clubbing   EKG:  EKG is ordered today. Sinus rhythm, rate 93, left axis deviation, left bundle branch block, compared to previous the EKG is now NSR.  02/18/2017  Recent Labs: 07/20/2016: TSH 1.44 09/08/2016: ALT 31 09/24/2016: B Natriuretic Peptide 1,016.2 10/02/2016: Pro B Natriuretic peptide (BNP) 666.0 02/08/2017: BUN 35; Creatinine, Ser 2.80; Hemoglobin 11.5; Platelets 173; Potassium 4.4; Sodium 144    Lipid Panel    Component Value Date/Time    CHOL 215 (H) 07/20/2016 1206   TRIG 92.0 07/20/2016 1206   HDL 95.10 07/20/2016 1206   CHOLHDL 2 07/20/2016 1206   VLDL 18.4 07/20/2016 1206   LDLCALC 101 (H) 07/20/2016 1206   LDLDIRECT 135.7 04/26/2013 1008      Wt Readings from Last 3 Encounters:  02/18/17 120 lb (54.4 kg)  02/12/17 120 lb (54.4 kg)  02/08/17 118 lb (53.5 kg)      Other studies Reviewed: Additional studies/ records  that were reviewed today include:    ED records.  Review of the above records demonstrates:  Please see elsewhere in the note.    Lab Results  Component Value Date   HGBA1C 7.0 10/13/2016     ASSESSMENT AND PLAN:  Atrial fib:   She is maintaining sinus rhythm.  Of note we did make the decision in the emergency room not to use anticoagulation. There is no documentation that she's having paroxysms of this. This was a one-time episode possibly related to the urinary infection. If she has continued palpitations however I'll probably follow-up with a monitor although she would be very reluctant to taking anticoagulation.  Exertional angina:   She's not had any further chest pain and has not required nitroglycerin. No change in therapy is indicated. She wants conservative therapy particularly avoiding Given her renal insufficiency.   CKD (chronic kidney disease), stage IV ([place]) Her last creatinine 10 days ago was 2.8. She has follow-up with renal and is awaiting notification of her next appointment. This has been stable.  HTN Her blood pressure is at target.  No change in therapy.   Ischemic cardiomyopathy ([place]) Suspect ischemic and non ischemic CM. Her Last EF Feb 2018 had improved from 15% to 20-25%.  She seems to be euvolemic. No change in therapy.  LBBB (left bundle branch block) Sinus bradycardia on low dose Coreg- asymptomatic. She will continue meds as listed.  DM Her last A1C is as above.  She will follow with Binnie Rail, MD  DYSLIPIDEMIA:   The patient says she's been  intolerant of all statins. She does not want further therapy. She has not been able to afford Zetia. She would not want PCSK9  Current medicines are reviewed at length with the patient today.  The patient does not have concerns regarding medicines.  The following changes have been made:   None  Labs/ tests ordered today include: No  Orders Placed This Encounter  Procedures  . EKG 12-Lead     Disposition:   FU with me in six  months.     Signed, Minus Breeding, MD  02/18/2017 9:07 PM    [place]

## 2017-02-18 ENCOUNTER — Ambulatory Visit (INDEPENDENT_AMBULATORY_CARE_PROVIDER_SITE_OTHER): Payer: Medicare Other | Admitting: Cardiology

## 2017-02-18 ENCOUNTER — Encounter: Payer: Self-pay | Admitting: Cardiology

## 2017-02-18 VITALS — BP 132/82 | HR 93 | Ht 60.0 in | Wt 120.0 lb

## 2017-02-18 DIAGNOSIS — I25118 Atherosclerotic heart disease of native coronary artery with other forms of angina pectoris: Secondary | ICD-10-CM

## 2017-02-18 DIAGNOSIS — N183 Chronic kidney disease, stage 3 unspecified: Secondary | ICD-10-CM

## 2017-02-18 DIAGNOSIS — I10 Essential (primary) hypertension: Secondary | ICD-10-CM | POA: Diagnosis not present

## 2017-02-18 DIAGNOSIS — I480 Paroxysmal atrial fibrillation: Secondary | ICD-10-CM | POA: Diagnosis not present

## 2017-02-18 DIAGNOSIS — I5022 Chronic systolic (congestive) heart failure: Secondary | ICD-10-CM | POA: Diagnosis not present

## 2017-02-18 NOTE — Patient Instructions (Signed)
Medication Instructions:  Your physician recommends that you continue on your current medications as directed. Please refer to the Current Medication list given to you today.  Follow-Up: Your physician wants you to follow-up in: 6 MONTHS with Dr. Hochrein. You will receive a reminder letter in the mail two months in advance. If you don't receive a letter, please call our office to schedule the follow-up appointment.   Any Other Special Instructions Will Be Listed Below (If Applicable).     If you need a refill on your cardiac medications before your next appointment, please call your pharmacy.   

## 2017-02-24 LAB — HM DIABETES EYE EXAM

## 2017-03-04 IMAGING — CR DG CHEST 1V PORT
1 series · 1 of 1 positions shown · non-contrast
Comparison: March 22, 2015

CLINICAL DATA: Chest pain

EXAM:
PORTABLE CHEST 1 VIEW

[AP]
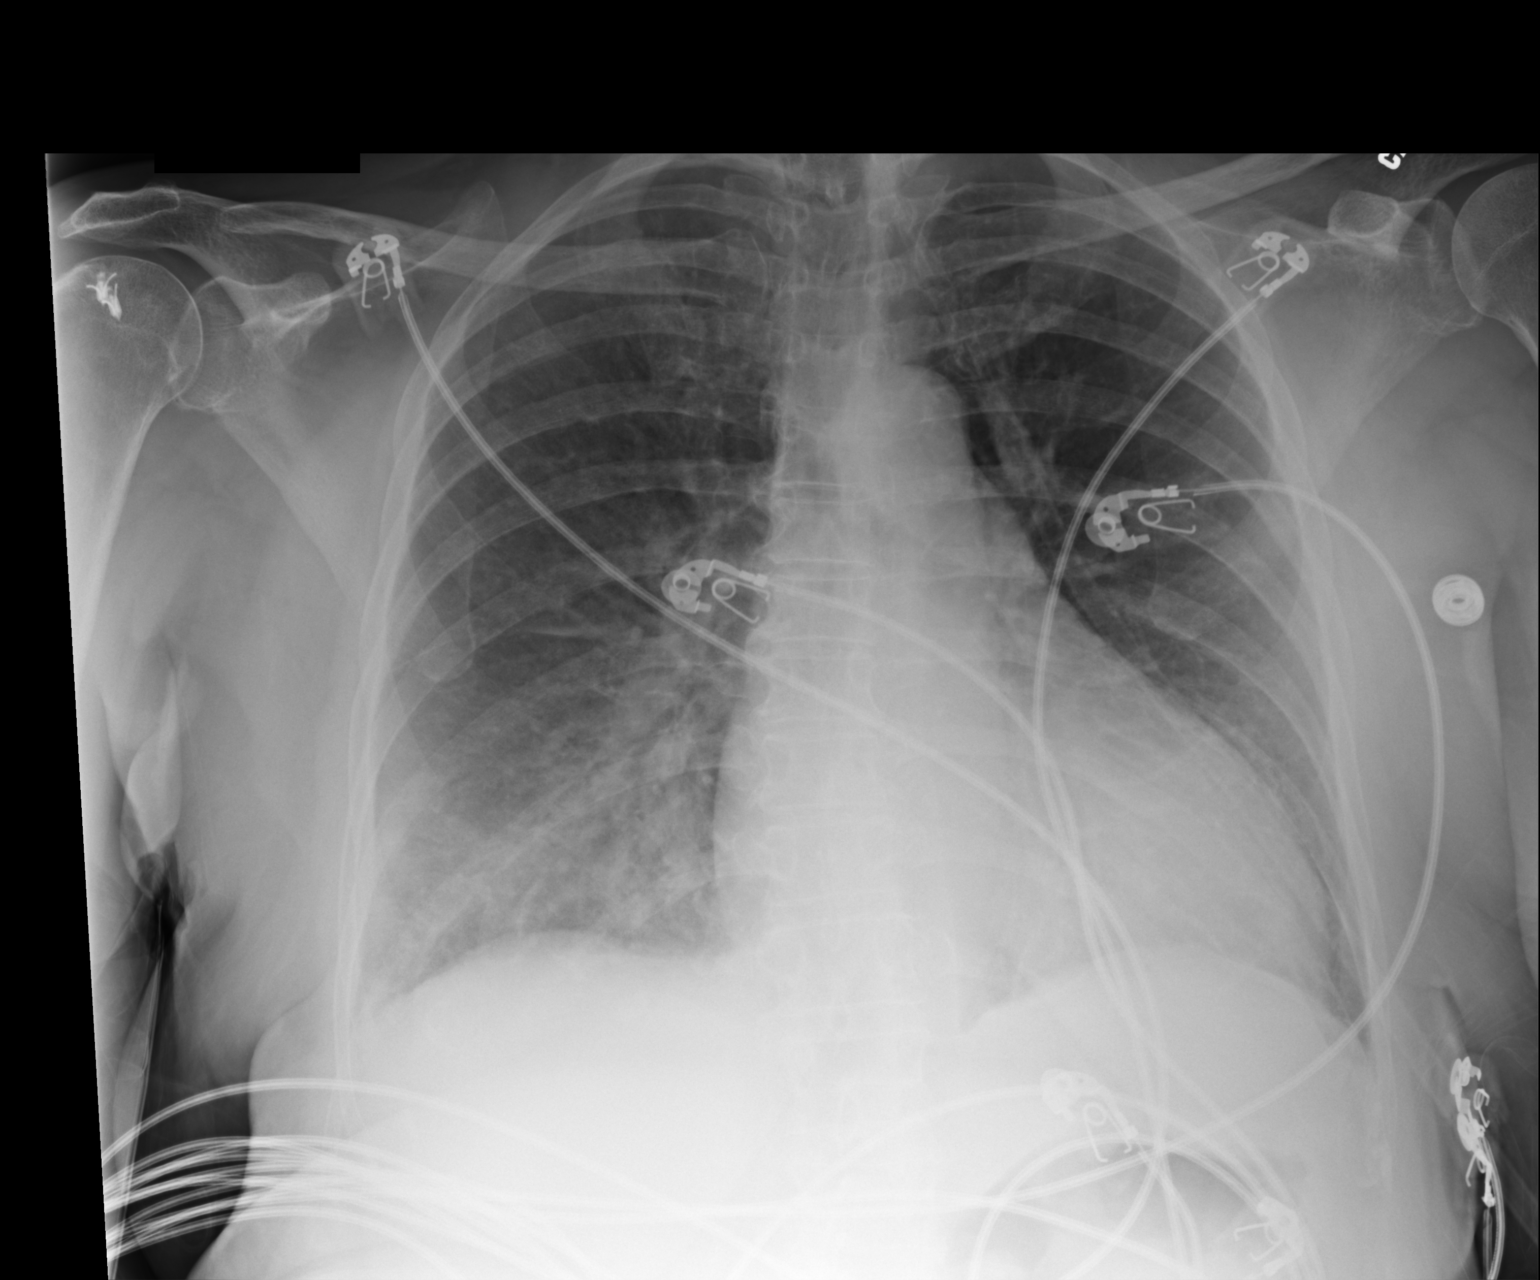

[1 of 1 positions shown; findings below may reference images not displayed]

FINDINGS: New infiltrate in the right lung base. Stable cardiomegaly. No other
interval changes or acute abnormalities.
IMPRESSION: New infiltrate in the right lung base, likely pneumonia or
aspiration. Recommend follow-up to resolution.

## 2017-03-05 IMAGING — CR DG CHEST 2V
2 series · 2 of 2 positions shown · non-contrast
Comparison: 05/31/2016

CLINICAL DATA: Acute on chronic combined systolic and diastolic
heart failure. Atrial fibrillation. Coronary artery disease.

EXAM:
CHEST  2 VIEW

[chest lat]
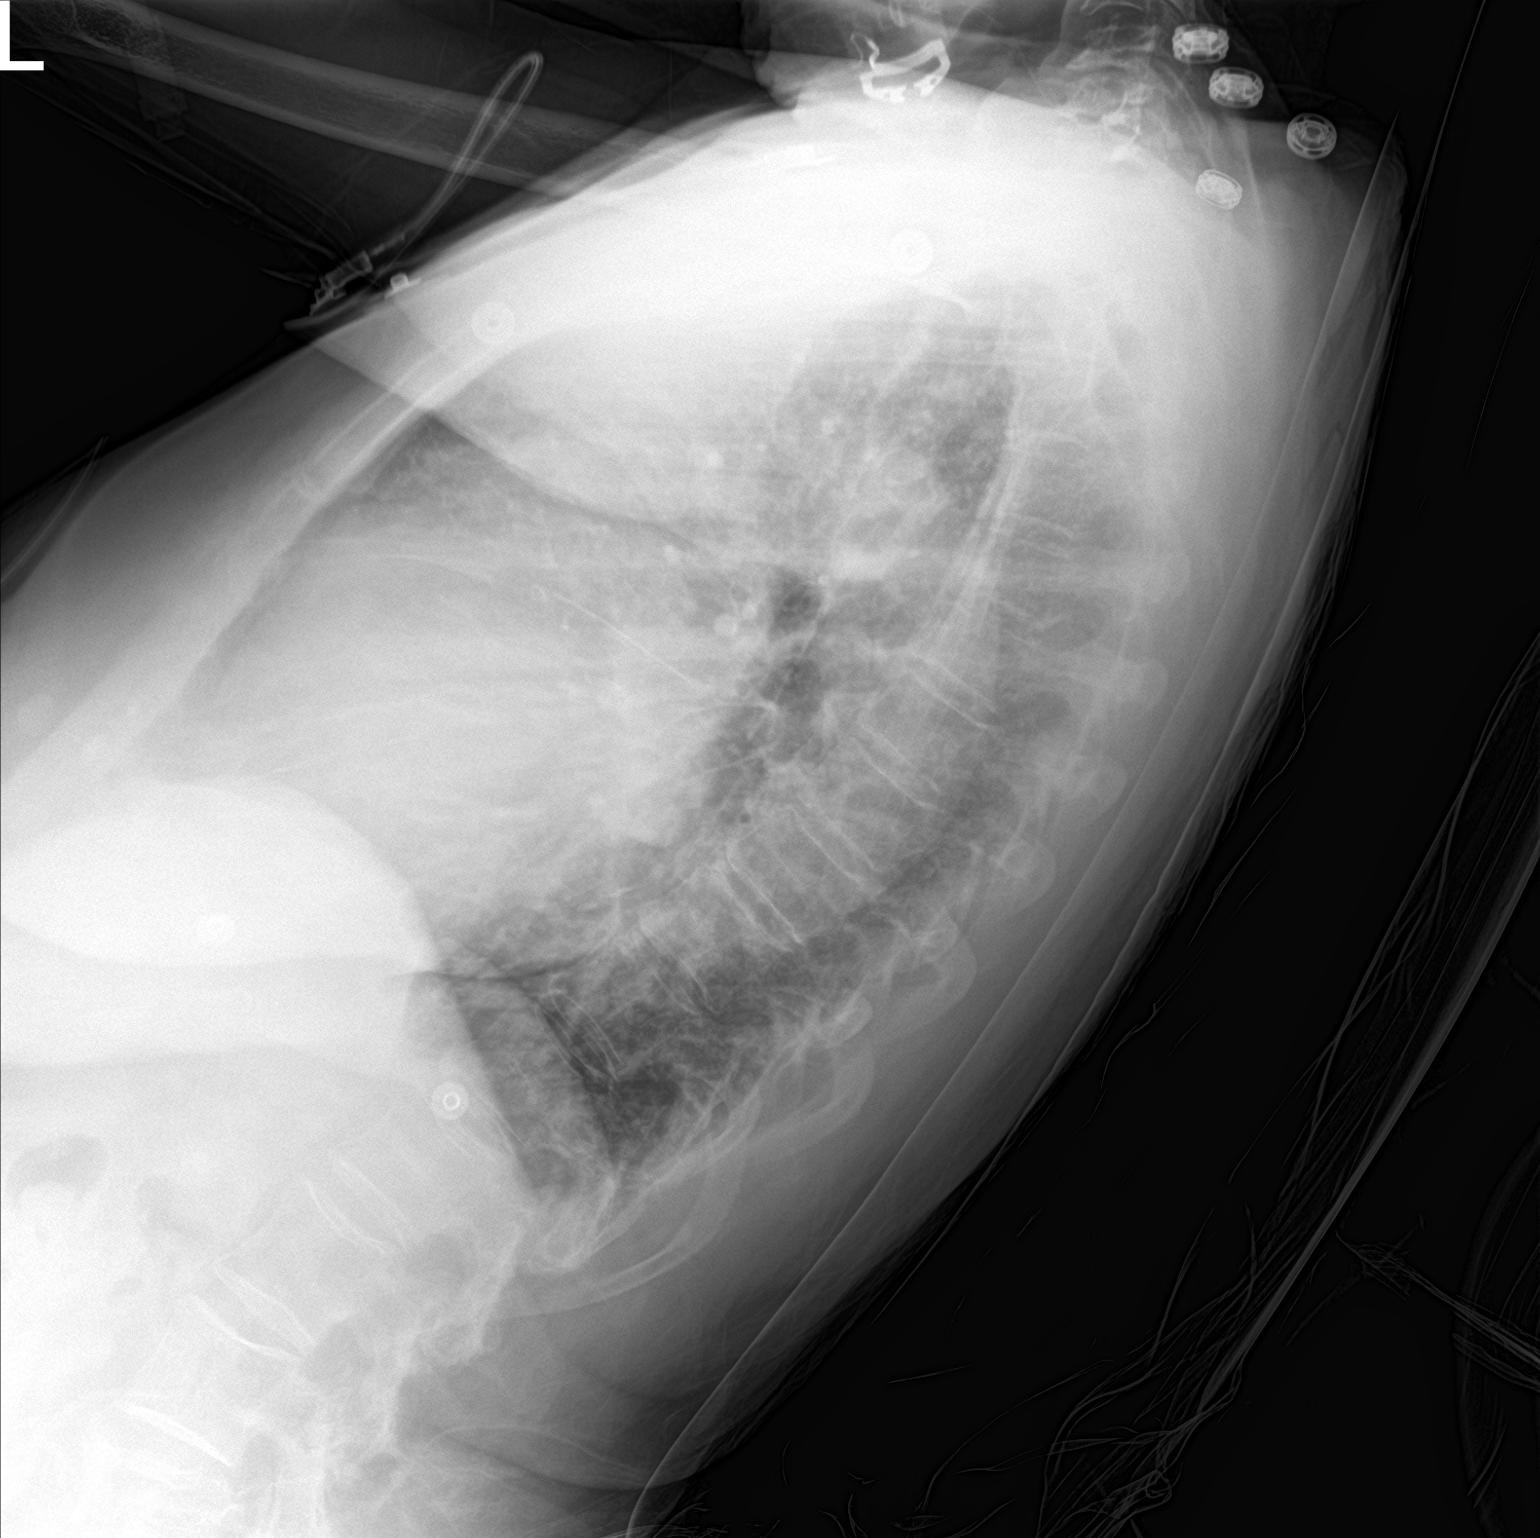

[chest ap]
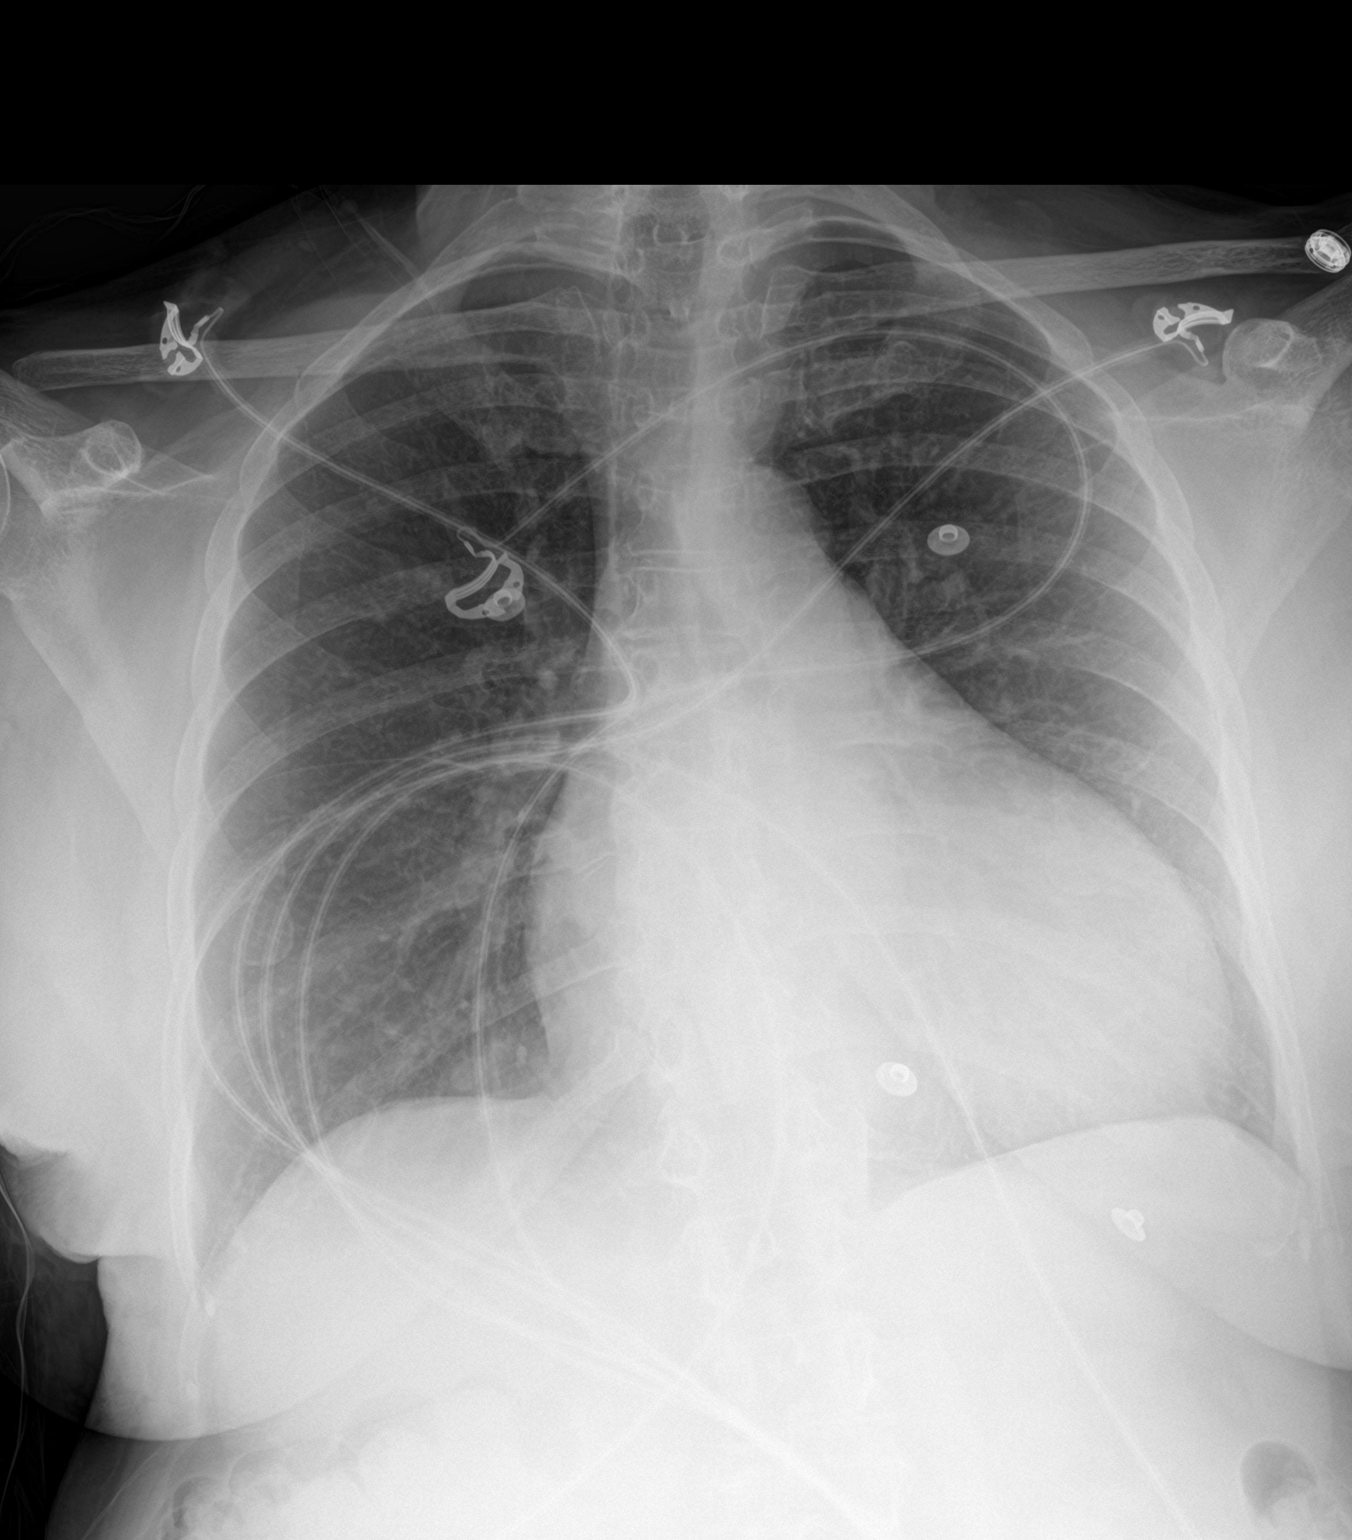

[2 of 2 positions shown; findings below may reference images not displayed]

FINDINGS: Cardiomegaly stable improved aeration of both lungs is seen. Both
lungs are clear. No evidence of pneumothorax or pleural effusion.
IMPRESSION: Mild cardiomegaly.  No active lung disease.

## 2017-04-20 ENCOUNTER — Ambulatory Visit (INDEPENDENT_AMBULATORY_CARE_PROVIDER_SITE_OTHER): Payer: Medicare Other | Admitting: Endocrinology

## 2017-04-20 ENCOUNTER — Encounter: Payer: Self-pay | Admitting: Endocrinology

## 2017-04-20 VITALS — BP 124/82 | HR 84 | Wt 121.8 lb

## 2017-04-20 DIAGNOSIS — E119 Type 2 diabetes mellitus without complications: Secondary | ICD-10-CM | POA: Diagnosis not present

## 2017-04-20 LAB — POCT GLYCOSYLATED HEMOGLOBIN (HGB A1C): HEMOGLOBIN A1C: 6.6

## 2017-04-20 MED ORDER — REPAGLINIDE 0.5 MG PO TABS: 0.5000 mg | ORAL_TABLET | Freq: Three times a day (TID) | ORAL | 11 refills | Status: AC

## 2017-04-20 NOTE — Progress Notes (Signed)
Subjective:    Patient ID: [name], female    DOB: 1927/11/18, 83 y.o.   MRN: [id number]  HPI Pt returns for f/u of diabetes mellitus: DM type: 2 Dx'ed: 9629 Complications: polyneuropathy, renal insufficiency, TIA, and CAD.   Therapy: repaglinide.  GDM: never.   DKA: never.   Severe hypoglycemia: never.   Pancreatitis: never.  Other: she took insulin 2014-2015, and she went back for insulin educ in 2018; oral rx options are limited by renal insufficiency, CHF, and her request for generic meds.  Interval history:  She says cbg's are approx 100.  pt states she feels well in general.  Pt says she takes repaglinide with meals, but she often eats just twice per day.   Past Medical History:  Diagnosis Date  . Anxiety   . Benign neoplasm of adrenal gland   . CAD (coronary artery disease)    a. nonobstructive disease by cath in 2010. b. NSTEMI 05/2016: cath not performed secondary to Stage 4 CKD  . Calculus of kidney   . CHF (congestive heart failure) ([place])   . Diabetes mellitus    hx noncompliance with rx'd tx  . Diverticulosis of colon (without mention of hemorrhage)   . DJD (degenerative joint disease)   . GERD (gastroesophageal reflux disease)    hx DU with H pylori 01/2009 and GIB, s/p triple rx  . GI bleed 2012   duoedonal ulcer  . H. pylori infection   . Hx of subarachnoid hemorrhage 2001  . Hypercholesterolemia   . Hypertension   . LBBB (left bundle branch block)   . Nonischemic cardiomyopathy ([place])    hypertensive  . Nontoxic multinodular goiter   . Paroxysmal atrial fibrillation ([facility])    not anticoag candidate  . Pulmonary embolism ([place]) 02/2009   on anticoag thru 11/2010 - stopped due to risk>benefit  . Pure hypercholesterolemia   . Tubulovillous adenoma of colon 2012    Past Surgical History:  Procedure Laterality Date  . CATARACT EXTRACTION    . KNEE SURGERY     left  . SHOULDER SURGERY     right  . TONSILLECTOMY      Social History   Social  History  . Marital status: Widowed    Spouse name: N/A  . Number of children: 1  . Years of education: N/A   Occupational History  . retired Retired   Social History Main Topics  . Smoking status: Former Smoker    Packs/day: 0.30    Years: 36.00    Types: Cigarettes    Quit date: 08/18/1983  . Smokeless tobacco: Never Used  . Alcohol use 0.6 oz/week    1 Glasses of wine per week     Comment: occ wine, beer during holidays or at social events  . Drug use: No  . Sexual activity: Not Currently   Other Topics Concern  . Not on file   Social History Narrative   Widow, 1 daughter    Current Outpatient Prescriptions on File Prior to Visit  Medication Sig Dispense Refill  . acetaminophen (TYLENOL) 325 MG tablet Take 325-650 mg by mouth every 6 (six) hours as needed for mild pain.    Marland Kitchen amLODipine (NORVASC) 10 MG tablet Take 1 tablet (10 mg total) by mouth at bedtime. 90 tablet 1  . Ascorbic Acid (VITAMIN C) 1000 MG tablet Take 1,000 mg by mouth 2 (two) times a week.     Marland Kitchen aspirin EC 81 MG tablet Take 1  tablet (81 mg total) by mouth daily.    . Blood Glucose Monitoring Suppl (ONETOUCH VERIO IQ SYSTEM) w/Device KIT Use to check blood sugar 1 time per day. 1 kit 2  . calcitRIOL (ROCALTROL) 0.25 MCG capsule Take 0.25 mcg by mouth daily.    . carvedilol (COREG) 12.5 MG tablet Take 1 tablet (12.5 mg total) by mouth 2 (two) times daily with a meal. 60 tablet 11  . furosemide (LASIX) 20 MG tablet Take 1 tablet (20 mg total) by mouth daily. NEED OV. (Patient taking differently: Take 40 mg by mouth daily as needed for edema. ) 30 tablet 3  . glucose blood (ONETOUCH VERIO) test strip Use to check blood sugar 1 time per day. 100 each 2  . hydrALAZINE (APRESOLINE) 25 MG tablet Take 1 tablet (25 mg total) by mouth 4 (four) times daily. (Patient taking differently: Take 25 mg by mouth 3 (three) times daily. ) 120 tablet 3  . isosorbide mononitrate (IMDUR) 120 MG 24 hr tablet Take 1 tablet (120 mg  total) by mouth daily. 30 tablet 11  . nitroGLYCERIN (NITROSTAT) 0.4 MG SL tablet PLACE 1 TABLET (0.4 MG TOTAL) UNDER THE TONGUE EVERY 5 (FIVE) MINUTES AS NEEDED FOR CHEST PAIN. 50 tablet 0  . sacubitril-valsartan (ENTRESTO) 24-26 MG Take 1 tablet by mouth 2 (two) times daily. 60 tablet 6   No current facility-administered medications on file prior to visit.     Allergies  Allergen Reactions  . Statins Other (See Comments)    Muscle Aches  . Lisinopril Cough    ACE INHIBITORS  . Penicillins Itching and Rash    Has patient had a PCN reaction causing immediate rash, facial/tongue/throat swelling, SOB or lightheadedness with hypotension: Yes Has patient had a PCN reaction causing severe rash involving mucus membranes or skin necrosis: Unknown Has patient had a PCN reaction that required hospitalization: Unknown Has patient had a PCN reaction occurring within the last 10 years: No If all of the above answers are "NO", then may proceed with Cephalosporin use.     Family History  Problem Relation Age of Onset  . Lung cancer Mother   . Brain cancer Mother   . Goiter Mother   . Cancer Mother   . Heart disease Mother   . Diabetes Mother   . Diabetes Maternal Grandmother   . Heart disease Maternal Aunt        x2  . Colon cancer Neg Hx     BP 124/82   Pulse 84   Wt 121 lb 12.8 oz (55.2 kg)   SpO2 96%   BMI 23.79 kg/m    Review of Systems She denies hypoglycemia.      Objective:   Physical Exam VITAL SIGNS:  See vs page GENERAL: no distress Pulses: foot pulses are intact bilaterally.   MSK: no deformity of the feet or ankles.  CV: no edema of the legs or ankles Skin:  no ulcer on the feet or ankles.  normal color and temp on the feet and ankles Neuro: sensation is intact to touch on the feet and ankles.     Lab Results  Component Value Date   HGBA1C 6.6 04/20/2017      Assessment & Plan:  Type 2 DM, with renal insuff: overcontrolled.  Patient Instructions     check your blood sugar once a day.  vary the time of day when you check, between before the 3 meals, and at bedtime.  also check if you  have symptoms of your blood sugar being too high or too low.  please keep a record of the readings and bring it to your next appointment here.  You can write it on any piece of paper.  please call us sooner if your blood sugar goes below 70, or if you have a lot of readings over 200.  Please reduce the repaglinide to 0.5 mg 3 times a day (just before each meal).  I have sent a new prescription to your pharmacy.  Please come back for a follow-up appointment in 4 months.

## 2017-04-20 NOTE — Patient Instructions (Addendum)
check your blood sugar once a day.  vary the time of day when you check, between before the 3 meals, and at bedtime.  also check if you have symptoms of your blood sugar being too high or too low.  please keep a record of the readings and bring it to your next appointment here.  You can write it on any piece of paper.  please call us sooner if your blood sugar goes below 70, or if you have a lot of readings over 200.  Please reduce the repaglinide to 0.5 mg 3 times a day (just before each meal).  I have sent a new prescription to your pharmacy.  Please come back for a follow-up appointment in 4 months.

## 2017-04-22 ENCOUNTER — Ambulatory Visit: Payer: Medicare Other | Admitting: Internal Medicine

## 2017-04-22 ENCOUNTER — Other Ambulatory Visit: Payer: Self-pay

## 2017-04-22 MED ORDER — ONETOUCH DELICA LANCETS FINE MISC: 2 refills | Status: AC

## 2017-04-30 ENCOUNTER — Other Ambulatory Visit: Payer: Self-pay

## 2017-04-30 MED ORDER — TRUEPLUS SAFETY LANCETS 28G MISC
2 refills | Status: AC
Start: 2017-04-30 — End: ?

## 2017-05-01 NOTE — Progress Notes (Signed)
Subjective:    Patient ID: [name], female    DOB: Apr 12, 1928, 83 y.o.   MRN: [id number]  HPI The patient is here for follow up.  CAD, chronic systolic CHF, PAfib, Hypertension: She is taking her medication daily. She is compliant with a low sodium diet.  She has intermittent chest pain, palpitations and sob with activity. She has needed to take SL NTG a few times since she was here last.  She sees cardiology regularly.  She is not exercising regularly, but is very active.       Diabetes: She follows with Dr Loanne Drilling.  Her last a1c was 6.6%.  She is taking her medication daily as prescribed. She is compliant with a diabetic diet. She is very active, but does not exercise regularly. She checks her feet daily and denies foot lesions. She is up-to-date with an ophthalmology examination.   Leg edema, [facility]:  She is following with [facility] and has a f/u appt next month.  She takes lasix 40 mg as needed, not daily.  She is compliant with a low sodium diet.      Hyperlipidemia: She is intolerant to statins. She is compliant with a low fat/cholesterol diet. She is not exercising regularly, but is active with house work and yard work.    Medications and allergies reviewed with patient and updated if appropriate.  Patient Active Problem List   Diagnosis Date Noted  . CKD (chronic kidney disease), stage III 02/18/2017  . UTI (urinary tract infection) 02/12/2017  . Coronary artery disease of native artery of native heart with stable angina pectoris ([place]) 11/15/2016  . Ischemic cardiomyopathy 11/15/2016  . Dilated cardiomyopathy ([place]) 10/06/2016  . LBBB (left bundle branch block) 10/06/2016  . NSTEMI (non-ST elevated myocardial infarction) ([place]) 09/25/2016  . History of pulmonary embolism 09/24/2016  . Bilateral leg edema 09/08/2016  . Chronic systolic heart failure ([place]) 09/08/2016  . Symptomatic bradycardia 05/31/2016  . Thrombocytopenia ([place]) 05/31/2016  .  Neuropathy 04/30/2016  . Diabetes mellitus type 2 in nonobese ([place]) 12/25/2015  . Chest pain 03/22/2015  . PAROXYSMAL ATRIAL FIBRILLATION 09/30/2009  . CKD (chronic kidney disease), stage IV ([place]) 08/05/2009  . GOITER, MULTINODULAR 05/11/2009  . PEPTIC ULCER DISEASE, HELICOBACTER PYLORI POSITIVE 03/11/2009  . Coronary atherosclerosis 11/29/2008  . BENIGN NEOPLASM OF ADRENAL GLAND 06/20/2008  . DIVERTICULOSIS OF COLON 06/20/2008  . ARTERIOVENOUS MALFORMATION 06/20/2008  . HYPERCHOLESTEROLEMIA 05/10/2008  . Essential hypertension 05/10/2008  . TRANSIENT ISCHEMIC ATTACK 05/10/2008  . NEPHROLITHIASIS 05/10/2008    Current Outpatient Prescriptions on File Prior to Visit  Medication Sig Dispense Refill  . acetaminophen (TYLENOL) 325 MG tablet Take 325-650 mg by mouth every 6 (six) hours as needed for mild pain.    Marland Kitchen amLODipine (NORVASC) 10 MG tablet Take 1 tablet (10 mg total) by mouth at bedtime. 90 tablet 1  . Ascorbic Acid (VITAMIN C) 1000 MG tablet Take 1,000 mg by mouth 2 (two) times a week.     Marland Kitchen aspirin EC 81 MG tablet Take 1 tablet (81 mg total) by mouth daily.    . Blood Glucose Monitoring Suppl (ONETOUCH VERIO IQ SYSTEM) w/Device KIT Use to check blood sugar 1 time per day. 1 kit 2  . calcitRIOL (ROCALTROL) 0.25 MCG capsule Take 0.25 mcg by mouth daily.    . carvedilol (COREG) 12.5 MG tablet Take 1 tablet (12.5 mg total) by mouth 2 (two) times daily with a meal. 60 tablet 11  . furosemide (LASIX)  20 MG tablet Take 1 tablet (20 mg total) by mouth daily. NEED OV. (Patient taking differently: Take 40 mg by mouth daily as needed for edema. ) 30 tablet 3  . glucose blood (ONETOUCH VERIO) test strip Use to check blood sugar 1 time per day. 100 each 2  . hydrALAZINE (APRESOLINE) 25 MG tablet Take 1 tablet (25 mg total) by mouth 4 (four) times daily. (Patient taking differently: Take 25 mg by mouth 3 (three) times daily. ) 120 tablet 3  . isosorbide mononitrate (IMDUR) 120 MG 24 hr tablet  Take 1 tablet (120 mg total) by mouth daily. 30 tablet 11  . nitroGLYCERIN (NITROSTAT) 0.4 MG SL tablet PLACE 1 TABLET (0.4 MG TOTAL) UNDER THE TONGUE EVERY 5 (FIVE) MINUTES AS NEEDED FOR CHEST PAIN. 50 tablet 0  . ONETOUCH DELICA LANCETS FINE MISC Use to check blood sugar 1 time per day. 100 each 2  . repaglinide (PRANDIN) 0.5 MG tablet Take 1 tablet (0.5 mg total) by mouth 3 (three) times daily before meals. 90 tablet 11  . sacubitril-valsartan (ENTRESTO) 24-26 MG Take 1 tablet by mouth 2 (two) times daily. 60 tablet 6  . TRUEPLUS SAFETY LANCETS 28G MISC Used to check blood sugars once daily. 100 each 2   No current facility-administered medications on file prior to visit.     Past Medical History:  Diagnosis Date  . Anxiety   . Benign neoplasm of adrenal gland   . CAD (coronary artery disease)    a. nonobstructive disease by cath in 2010. b. NSTEMI 05/2016: cath not performed secondary to Stage 4 CKD  . Calculus of kidney   . CHF (congestive heart failure) ([place])   . Diabetes mellitus    hx noncompliance with rx'd tx  . Diverticulosis of colon (without mention of hemorrhage)   . DJD (degenerative joint disease)   . GERD (gastroesophageal reflux disease)    hx DU with H pylori 01/2009 and GIB, s/p triple rx  . GI bleed 2012   duoedonal ulcer  . H. pylori infection   . Hx of subarachnoid hemorrhage 2001  . Hypercholesterolemia   . Hypertension   . LBBB (left bundle branch block)   . Nonischemic cardiomyopathy ([place])    hypertensive  . Nontoxic multinodular goiter   . Paroxysmal atrial fibrillation ([facility])    not anticoag candidate  . Pulmonary embolism ([place]) 02/2009   on anticoag thru 11/2010 - stopped due to risk>benefit  . Pure hypercholesterolemia   . Tubulovillous adenoma of colon 2012    Past Surgical History:  Procedure Laterality Date  . CATARACT EXTRACTION    . KNEE SURGERY     left  . SHOULDER SURGERY     right  . TONSILLECTOMY      Social History   Social  History  . Marital status: Widowed    Spouse name: N/A  . Number of children: 1  . Years of education: N/A   Occupational History  . retired Retired   Social History Main Topics  . Smoking status: Former Smoker    Packs/day: 0.30    Years: 36.00    Types: Cigarettes    Quit date: 08/18/1983  . Smokeless tobacco: Never Used  . Alcohol use 0.6 oz/week    1 Glasses of wine per week     Comment: occ wine, beer during holidays or at social events  . Drug use: No  . Sexual activity: Not Currently   Other Topics Concern  . None  Social History Narrative   Widow, 1 daughter    Family History  Problem Relation Age of Onset  . Lung cancer Mother   . Brain cancer Mother   . Goiter Mother   . Cancer Mother   . Heart disease Mother   . Diabetes Mother   . Diabetes Maternal Grandmother   . Heart disease Maternal Aunt        x2  . Colon cancer Neg Hx     Review of Systems  Constitutional: Negative for chills and fever.  Respiratory: Positive for cough (intermittent, dry) and shortness of breath (with activity up an incline). Negative for wheezing.   Cardiovascular: Positive for chest pain (occasional), palpitations (with exertion up an incline) and leg swelling (occasional).  Gastrointestinal:       No gerd  Neurological: Negative for light-headedness and headaches.       Objective:   Vitals:   05/03/17 1350  BP: 134/82  Pulse: 78  Resp: 16  Temp: 98 F (36.7 C)  SpO2: 94%   Wt Readings from Last 3 Encounters:  05/03/17 125 lb (56.7 kg)  04/20/17 121 lb 12.8 oz (55.2 kg)  02/18/17 120 lb (54.4 kg)   Body mass index is 24.41 kg/m.   Physical Exam    Constitutional: Appears well-developed and well-nourished. No distress.  HENT:  Head: Normocephalic and atraumatic.  Neck: Neck supple. No tracheal deviation present. No thyromegaly present.  No cervical lymphadenopathy Cardiovascular: Normal rate, regular rhythm and normal heart sounds.   No murmur heard. No  carotid bruit .  No edema Pulmonary/Chest: Effort normal and breath sounds normal. No respiratory distress. No has no wheezes. No rales.  Skin: Skin is warm and dry. Not diaphoretic.  Psychiatric: Normal mood and affect. Behavior is normal.      Assessment & Plan:    See Problem List for Assessment and Plan of chronic medical problems.

## 2017-05-03 ENCOUNTER — Ambulatory Visit (INDEPENDENT_AMBULATORY_CARE_PROVIDER_SITE_OTHER): Payer: Medicare Other | Admitting: Internal Medicine

## 2017-05-03 ENCOUNTER — Encounter: Payer: Self-pay | Admitting: Internal Medicine

## 2017-05-03 VITALS — BP 134/82 | HR 78 | Temp 98.0°F | Resp 16 | Wt 125.0 lb

## 2017-05-03 DIAGNOSIS — I10 Essential (primary) hypertension: Secondary | ICD-10-CM | POA: Diagnosis not present

## 2017-05-03 DIAGNOSIS — I5022 Chronic systolic (congestive) heart failure: Secondary | ICD-10-CM | POA: Diagnosis not present

## 2017-05-03 DIAGNOSIS — I25119 Atherosclerotic heart disease of native coronary artery with unspecified angina pectoris: Secondary | ICD-10-CM | POA: Diagnosis not present

## 2017-05-03 DIAGNOSIS — R600 Localized edema: Secondary | ICD-10-CM | POA: Diagnosis not present

## 2017-05-03 DIAGNOSIS — E119 Type 2 diabetes mellitus without complications: Secondary | ICD-10-CM

## 2017-05-03 DIAGNOSIS — N184 Chronic kidney disease, stage 4 (severe): Secondary | ICD-10-CM | POA: Diagnosis not present

## 2017-05-03 NOTE — Patient Instructions (Addendum)
  No immunizations administered today.   Medications reviewed and updated.  No changes recommended at this time.    Please followup in 6 months   

## 2017-05-03 NOTE — Assessment & Plan Note (Signed)
BP well controlled Current regimen effective and well tolerated Continue current medications at current doses  

## 2017-05-03 NOTE — Assessment & Plan Note (Signed)
euvolemic on exam Following with cardiology Continue current medications and taking lasix 40 mg as needed

## 2017-05-03 NOTE — Assessment & Plan Note (Addendum)
Has taken SL NTG x 4 since she was last here - the pain resolved quickly Following with Cardiology  Continue current medications

## 2017-05-03 NOTE — Assessment & Plan Note (Signed)
Has intermittent leg edema - occurs when she is more  Taking lasix 40 mg as needed, not daily Will continue

## 2017-05-03 NOTE — Assessment & Plan Note (Addendum)
Following with [facility] - has f/u in appt next month They will check cmp so we will hold off on labs today

## 2017-05-03 NOTE — Assessment & Plan Note (Addendum)
Managed by Dr Loanne Drilling Last a1c 6.6  Continue meds per Dr Loanne Drilling

## 2017-06-04 DIAGNOSIS — N184 Chronic kidney disease, stage 4 (severe): Secondary | ICD-10-CM | POA: Diagnosis not present

## 2017-06-07 DIAGNOSIS — N184 Chronic kidney disease, stage 4 (severe): Secondary | ICD-10-CM | POA: Diagnosis not present

## 2017-06-07 DIAGNOSIS — I509 Heart failure, unspecified: Secondary | ICD-10-CM | POA: Diagnosis not present

## 2017-06-07 DIAGNOSIS — N2581 Secondary hyperparathyroidism of renal origin: Secondary | ICD-10-CM | POA: Diagnosis not present

## 2017-06-07 DIAGNOSIS — R809 Proteinuria, unspecified: Secondary | ICD-10-CM | POA: Diagnosis not present

## 2017-06-07 DIAGNOSIS — I10 Essential (primary) hypertension: Secondary | ICD-10-CM | POA: Diagnosis not present

## 2017-06-28 IMAGING — CR DG CHEST 2V
2 series · 2 of 2 positions shown · non-contrast
Comparison: 09/08/2016 chest radiograph

CLINICAL DATA: 88 y/o F; left-sided chest pain radiating down the
left arm.

EXAM:
CHEST  2 VIEW

[chest lat]
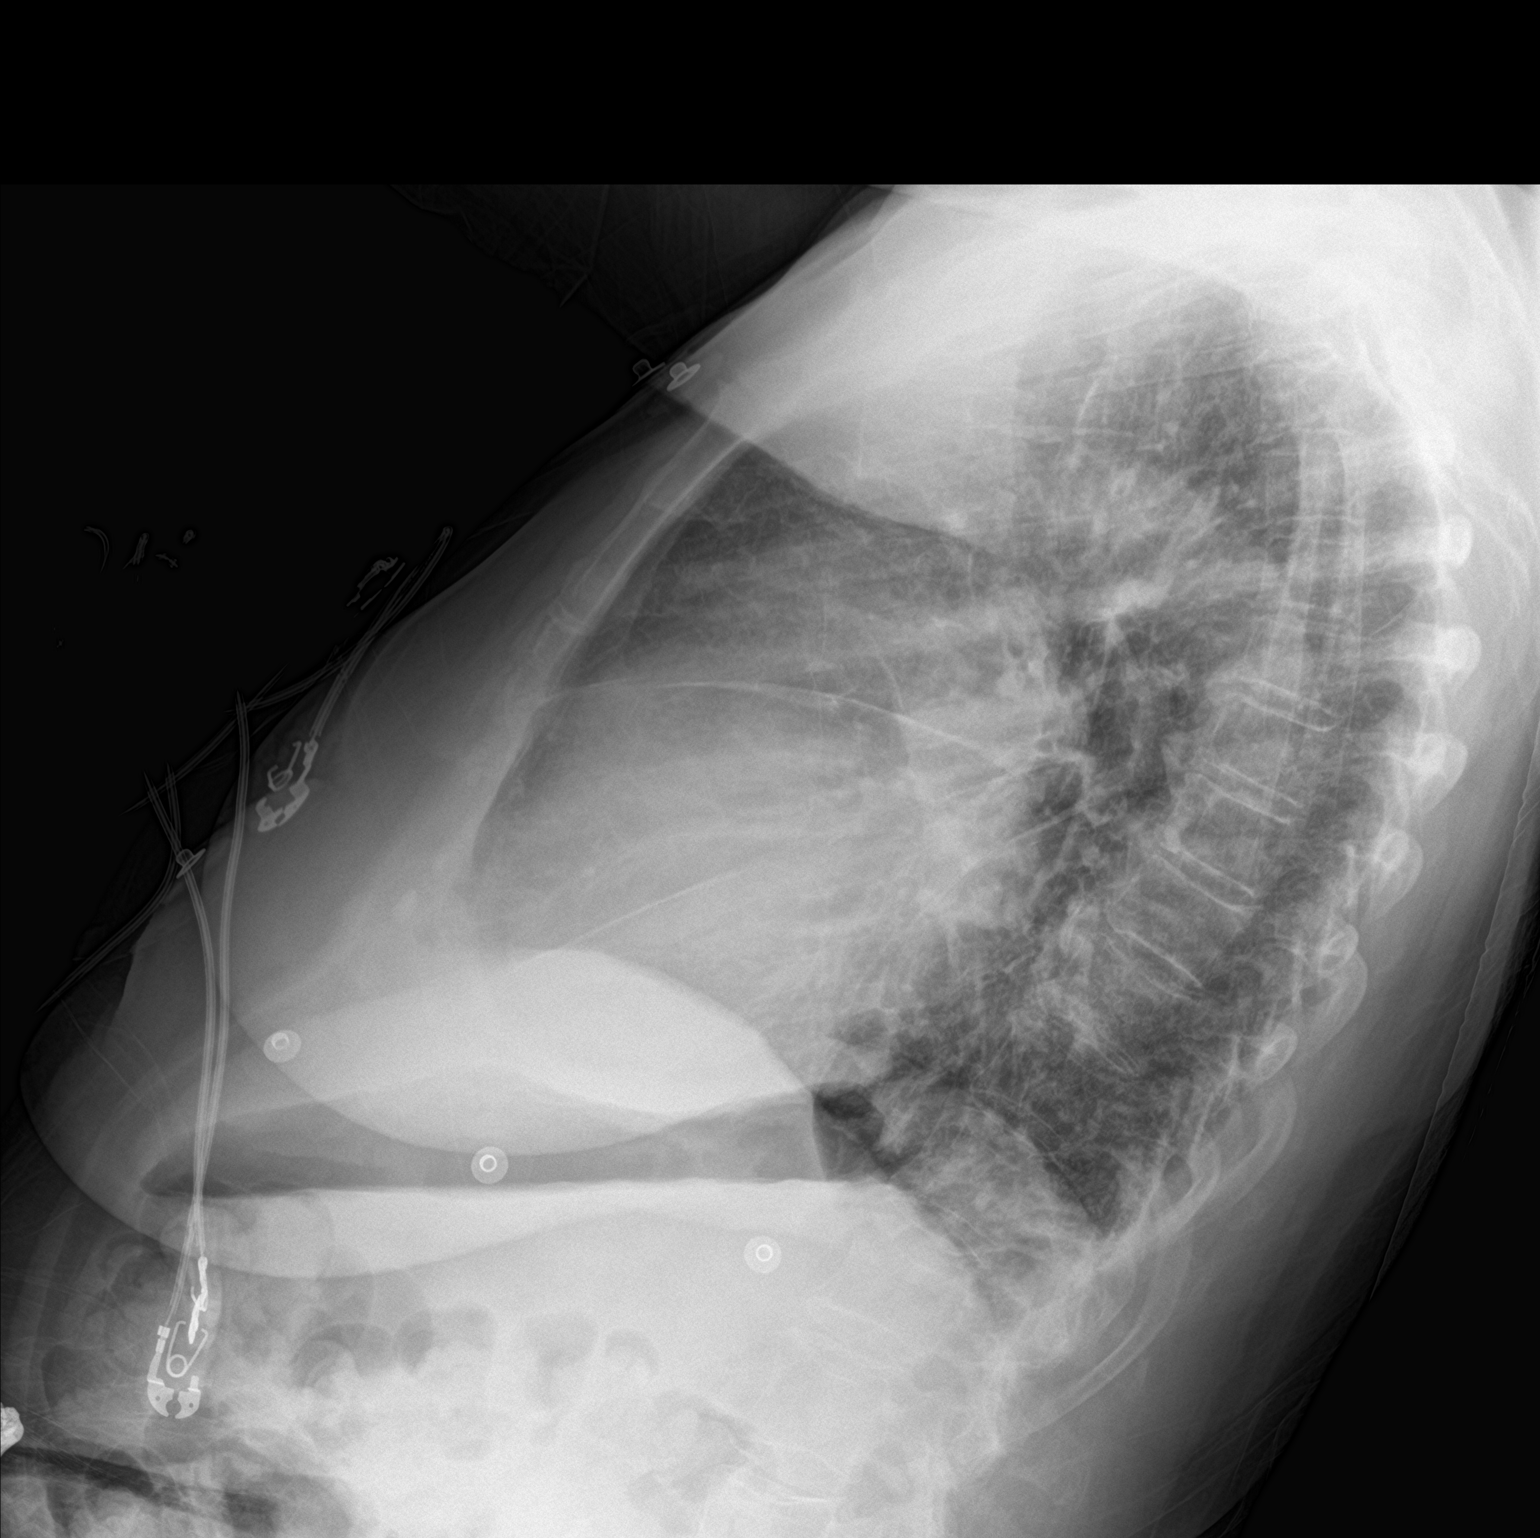

[chest ap]
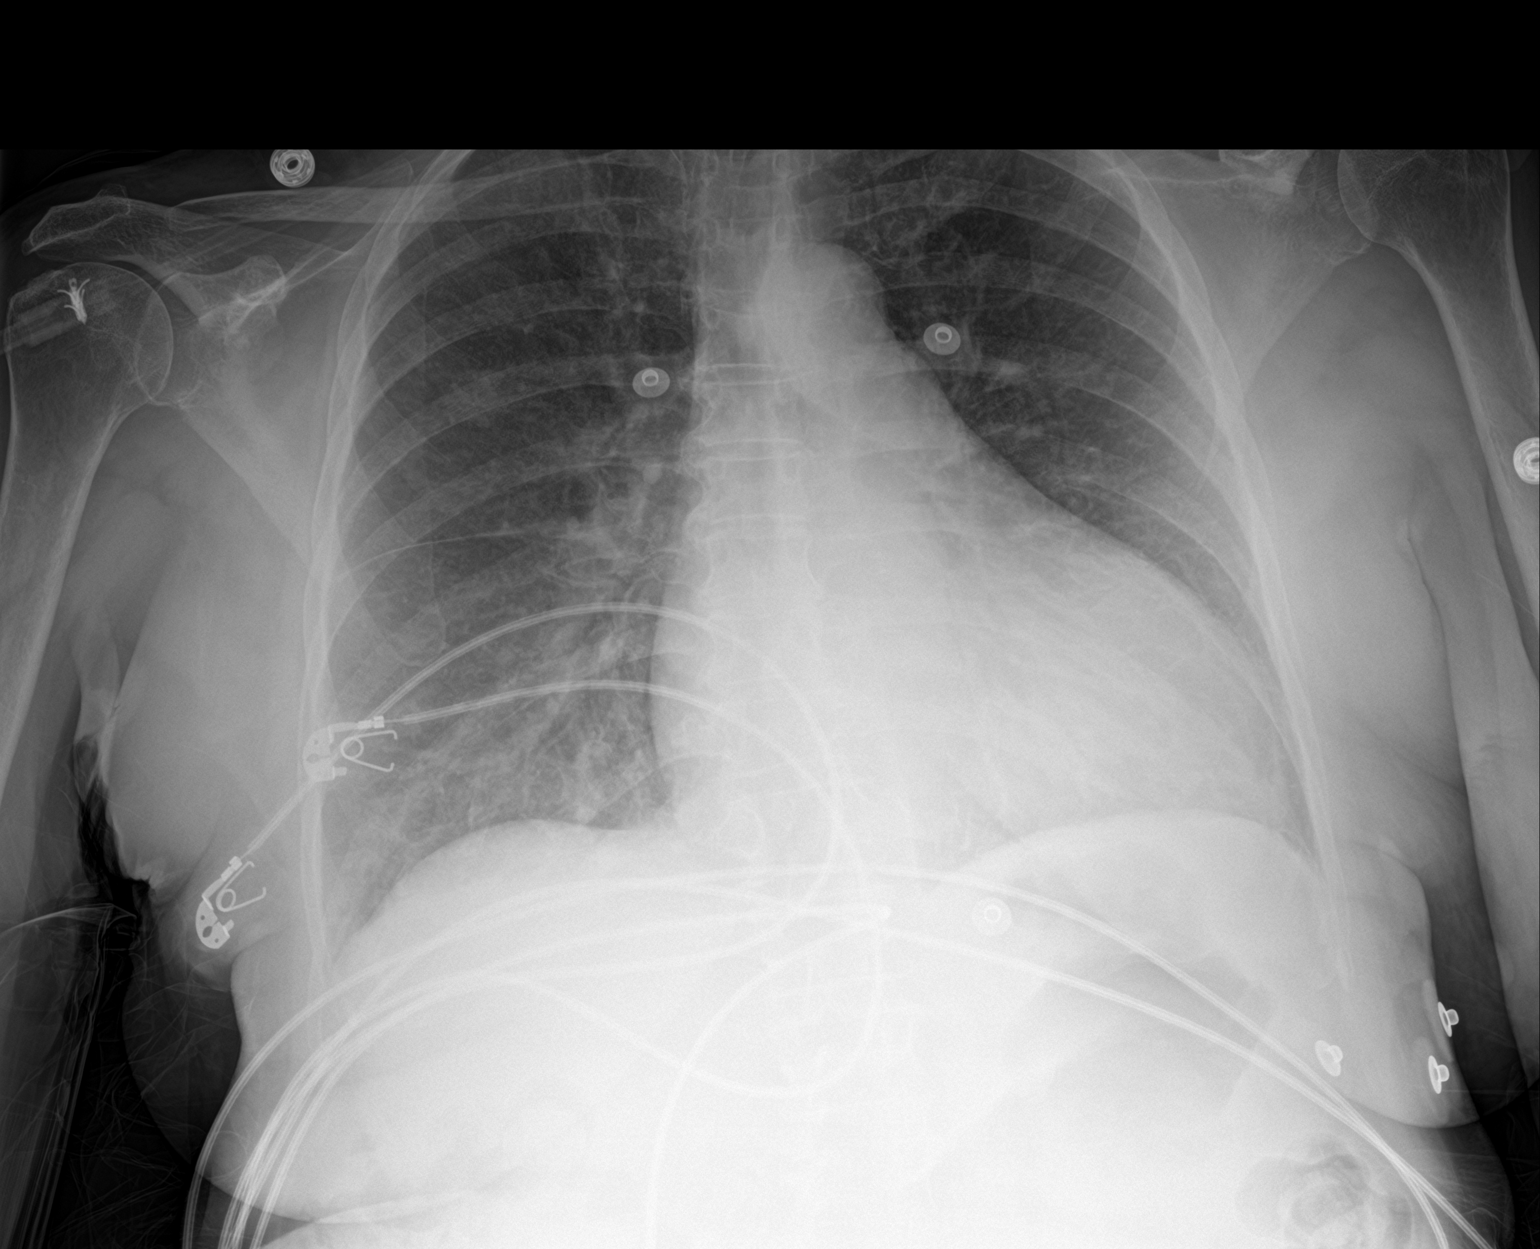

[2 of 2 positions shown; findings below may reference images not displayed]

FINDINGS: Stable moderate cardiomegaly. No focal consolidation. No pleural
effusion. Right shoulder rotator cuff repair changes. No acute
osseous abnormality identified.
IMPRESSION: Stable cardiomegaly.  No focal consolidation or effusion.

By: Sigert Almelo M.D.

## 2017-06-28 IMAGING — NM NM PULMONARY VENT & PERF
16 series · 16 of 16 positions shown · non-contrast
Comparison: Chest x-ray earlier today

CLINICAL DATA: Left side chest pain.

EXAM:
NUCLEAR MEDICINE VENTILATION - PERFUSION LUNG SCAN
TECHNIQUE: Ventilation images were obtained in multiple projections using
inhaled aerosol Hc-88m DTPA. Perfusion images were obtained in
multiple projections after intravenous injection of Hc-88m MAA.
RADIOPHARMACEUTICALS:  30.3 mCi 0echnetium-RRm DTPA aerosol
inhalation and 4.1 mCi 0echnetium-RRm MAA IV

[Series 1: ant/post vent · 4.14mm/px · 1 of 1 slices shown (1 of 2)]
[im 1/1]
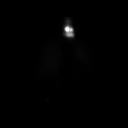

[Series 1: ant/post vent · 4.14mm/px · 1 of 1 slices shown (2 of 2)]
[im 1/1]
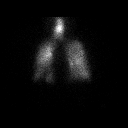

[Series 2: lao/rpo vent · 4.14mm/px · 1 of 1 slices shown (1 of 2)]
[im 1/1]
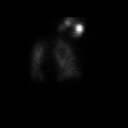

[Series 2: lao/rpo vent · 4.14mm/px · 1 of 1 slices shown (2 of 2)]
[im 1/1]
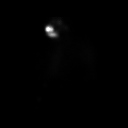

[Series 3: lpo/rao vent · 4.14mm/px · 1 of 1 slices shown (1 of 2)]
[im 1/1]
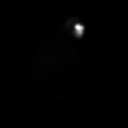

[Series 3: lpo/rao vent · 4.14mm/px · 1 of 1 slices shown (2 of 2)]
[im 1/1]
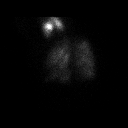

[Series 4: lt lat/rt lat vent · 4.14mm/px · 1 of 1 slices shown (1 of 2)]
[im 1/1]
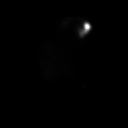

[Series 4: lt lat/rt lat vent · 4.14mm/px · 1 of 1 slices shown (2 of 2)]
[im 1/1]
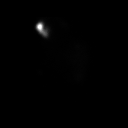

[Series 5: lt lat/rt lat perf · 4.14mm/px · 1 of 1 slices shown (1 of 2)]
[im 1/1]
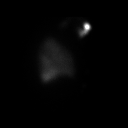

[Series 5: lt lat/rt lat perf · 4.14mm/px · 1 of 1 slices shown (2 of 2)]
[im 1/1]
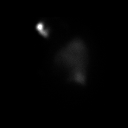

[Series 6: lpo/rao perf · 4.14mm/px · 1 of 1 slices shown (1 of 2)]
[im 1/1]
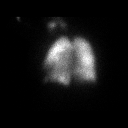

[Series 6: lpo/rao perf · 4.14mm/px · 1 of 1 slices shown (2 of 2)]
[im 1/1]
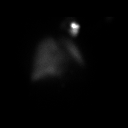

[Series 7: ant/post perf · 4.14mm/px · 1 of 1 slices shown (1 of 2)]
[im 1/1]
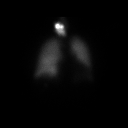

[Series 7: ant/post perf · 4.14mm/px · 1 of 1 slices shown (2 of 2)]
[im 1/1]
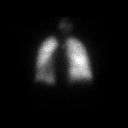

[Series 8: lao/rpo perf · 4.14mm/px · 1 of 1 slices shown (1 of 2)]
[im 1/1]
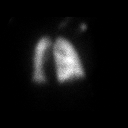

[Series 8: lao/rpo perf · 4.14mm/px · 1 of 1 slices shown (2 of 2)]
[im 1/1]
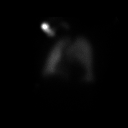

[16 of 16 positions shown; findings below may reference images not displayed]

FINDINGS: Ventilation: Patchy nonsegmental ventilation defects, possibly
related to obstructive lung disease

Perfusion: No wedge shaped peripheral perfusion defects to suggest
acute pulmonary embolism.
IMPRESSION: No evidence of pulmonary embolus.

## 2017-07-14 ENCOUNTER — Telehealth: Payer: Self-pay | Admitting: Internal Medicine

## 2017-07-15 ENCOUNTER — Telehealth: Payer: Self-pay | Admitting: Internal Medicine

## 2017-07-15 NOTE — Telephone Encounter (Signed)
Noted.  I spoke with EMS last night - she was found on her couch.

## 2017-07-15 NOTE — Telephone Encounter (Signed)
Pt is deceased. 

## 2017-07-17 DIAGNOSIS — 419620001 Death: Secondary | SNOMED CT | POA: Diagnosis not present

## 2017-07-17 NOTE — Telephone Encounter (Signed)
Paramedic called stating that Dr. Quay Burow was needed to be contacted in order to get death certificate signed. Dr. Quay Burow contacted and conference call initiated with the paramedic.

## 2017-07-17 DEATH — deceased

## 2017-07-23 ENCOUNTER — Telehealth: Payer: Self-pay

## 2017-07-23 NOTE — Telephone Encounter (Signed)
On 07/23/17 I received a signed d/c that [facility] home dropped off at [facility]). I called the funeral home and spoke with Crystal and let her know that the d/c is ready here for pickup.

## 2017-07-27 ENCOUNTER — Telehealth: Payer: Self-pay | Admitting: Internal Medicine

## 2017-07-27 NOTE — Telephone Encounter (Signed)
Copied from [place]. Topic: General - Other >> Jul 27, 2017  2:11 PM Scherrie Gerlach wrote: Reason for CRM: pt's daughter called to let the doc know pt passed 11/28.  She thinks you may know, but just wanted to make sure.

## 2017-07-27 NOTE — Telephone Encounter (Signed)
I was aware

## 2017-08-20 ENCOUNTER — Ambulatory Visit: Payer: Medicare Other | Admitting: Endocrinology

## 2017-08-24 ENCOUNTER — Telehealth: Payer: Self-pay | Admitting: Endocrinology

## 2017-08-24 NOTE — Telephone Encounter (Signed)
Patient passed away, her daughter Ria Clock is Scientist, physiological and needs the date her mom started being seen here. She has paperwork for [employer]  that requires date of treatment Ria Clock thinks that would be the first date she started). Please call her at work# [phone] first-if unavailable call ph# [phone]-

## 2017-08-24 NOTE — Telephone Encounter (Signed)
I called and left patient's daughter a detailed VM asking if all she needed was the first day she saw Dr. Loanne Drilling. By going through patient's chart I was able to see that was 09/12/2008. I stated that she could call back if there was any furthr questions she had.

## 2017-11-02 ENCOUNTER — Ambulatory Visit: Payer: Medicare Other | Admitting: Internal Medicine
